# Patient Record
Sex: Female | Born: 1940
Health system: Southern US, Community
[De-identification: ages and names within clinical notes are randomized; demographics above are authoritative.]

## PROBLEM LIST (undated history)

## (undated) DIAGNOSIS — M21372 Foot drop, left foot: Secondary | ICD-10-CM

## (undated) DIAGNOSIS — K219 Gastro-esophageal reflux disease without esophagitis: Secondary | ICD-10-CM

## (undated) DIAGNOSIS — F329 Major depressive disorder, single episode, unspecified: Secondary | ICD-10-CM

## (undated) DIAGNOSIS — R51 Headache: Secondary | ICD-10-CM

## (undated) DIAGNOSIS — M545 Low back pain, unspecified: Secondary | ICD-10-CM

## (undated) DIAGNOSIS — F419 Anxiety disorder, unspecified: Secondary | ICD-10-CM

## (undated) DIAGNOSIS — M1990 Unspecified osteoarthritis, unspecified site: Secondary | ICD-10-CM

## (undated) DIAGNOSIS — I219 Acute myocardial infarction, unspecified: Secondary | ICD-10-CM

## (undated) DIAGNOSIS — J189 Pneumonia, unspecified organism: Secondary | ICD-10-CM

## (undated) DIAGNOSIS — G5600 Carpal tunnel syndrome, unspecified upper limb: Secondary | ICD-10-CM

## (undated) DIAGNOSIS — Z8659 Personal history of other mental and behavioral disorders: Secondary | ICD-10-CM

## (undated) DIAGNOSIS — I509 Heart failure, unspecified: Secondary | ICD-10-CM

## (undated) DIAGNOSIS — I2510 Atherosclerotic heart disease of native coronary artery without angina pectoris: Secondary | ICD-10-CM

## (undated) DIAGNOSIS — I10 Essential (primary) hypertension: Secondary | ICD-10-CM

## (undated) DIAGNOSIS — E785 Hyperlipidemia, unspecified: Secondary | ICD-10-CM

## (undated) DIAGNOSIS — E669 Obesity, unspecified: Secondary | ICD-10-CM

## (undated) DIAGNOSIS — F32A Depression, unspecified: Secondary | ICD-10-CM

## (undated) DIAGNOSIS — Z905 Acquired absence of kidney: Secondary | ICD-10-CM

## (undated) HISTORY — DX: Essential (primary) hypertension: I10

## (undated) HISTORY — PX: CARDIAC CATHETERIZATION: SHX172

## (undated) HISTORY — DX: Obesity, unspecified: E66.9

## (undated) HISTORY — DX: Gastro-esophageal reflux disease without esophagitis: K21.9

## (undated) HISTORY — DX: Major depressive disorder, single episode, unspecified: F32.9

## (undated) HISTORY — PX: TUBAL LIGATION: SHX77

## (undated) HISTORY — DX: Headache: R51

## (undated) HISTORY — DX: Carpal tunnel syndrome, unspecified upper limb: G56.00

## (undated) HISTORY — DX: Acute myocardial infarction, unspecified: I21.9

## (undated) HISTORY — DX: Personal history of other mental and behavioral disorders: Z86.59

## (undated) HISTORY — DX: Acquired absence of kidney: Z90.5

## (undated) HISTORY — PX: BREAST BIOPSY: SHX20

## (undated) HISTORY — DX: Unspecified osteoarthritis, unspecified site: M19.90

## (undated) HISTORY — DX: Low back pain: M54.5

## (undated) HISTORY — DX: Hyperlipidemia, unspecified: E78.5

## (undated) HISTORY — PX: EYE SURGERY: SHX253

## (undated) HISTORY — DX: Heart failure, unspecified: I50.9

## (undated) HISTORY — DX: Depression, unspecified: F32.A

## (undated) HISTORY — DX: Low back pain, unspecified: M54.50

## (undated) HISTORY — DX: Atherosclerotic heart disease of native coronary artery without angina pectoris: I25.10

---

## 1999-05-29 ENCOUNTER — Encounter: Payer: Self-pay | Admitting: Family Medicine

## 1999-05-29 ENCOUNTER — Encounter: Admission: RE | Admit: 1999-05-29 | Discharge: 1999-05-29 | Payer: Self-pay | Admitting: Family Medicine

## 1999-09-26 ENCOUNTER — Ambulatory Visit (HOSPITAL_COMMUNITY): Admission: RE | Admit: 1999-09-26 | Discharge: 1999-09-26 | Payer: Self-pay | Admitting: Family Medicine

## 1999-09-26 ENCOUNTER — Encounter: Payer: Self-pay | Admitting: Family Medicine

## 2000-02-07 ENCOUNTER — Emergency Department (HOSPITAL_COMMUNITY): Admission: EM | Admit: 2000-02-07 | Discharge: 2000-02-07 | Payer: Self-pay | Admitting: Emergency Medicine

## 2000-02-17 ENCOUNTER — Encounter: Payer: Self-pay | Admitting: Family Medicine

## 2000-02-17 ENCOUNTER — Encounter: Admission: RE | Admit: 2000-02-17 | Discharge: 2000-02-17 | Payer: Self-pay | Admitting: Family Medicine

## 2000-03-02 ENCOUNTER — Other Ambulatory Visit: Admission: RE | Admit: 2000-03-02 | Discharge: 2000-03-02 | Payer: Self-pay | Admitting: *Deleted

## 2000-06-22 HISTORY — PX: REPLACEMENT TOTAL HIP W/  RESURFACING IMPLANTS: SUR1222

## 2000-10-05 ENCOUNTER — Ambulatory Visit (HOSPITAL_BASED_OUTPATIENT_CLINIC_OR_DEPARTMENT_OTHER): Admission: RE | Admit: 2000-10-05 | Discharge: 2000-10-05 | Payer: Self-pay | Admitting: Orthopedic Surgery

## 2000-10-18 ENCOUNTER — Inpatient Hospital Stay (HOSPITAL_COMMUNITY): Admission: EM | Admit: 2000-10-18 | Discharge: 2000-10-22 | Payer: Self-pay

## 2000-11-10 ENCOUNTER — Encounter (HOSPITAL_COMMUNITY): Admission: RE | Admit: 2000-11-10 | Discharge: 2001-02-08 | Payer: Self-pay | Admitting: Cardiovascular Disease

## 2000-12-07 ENCOUNTER — Inpatient Hospital Stay (HOSPITAL_COMMUNITY): Admission: EM | Admit: 2000-12-07 | Discharge: 2000-12-08 | Payer: Self-pay | Admitting: Emergency Medicine

## 2000-12-07 ENCOUNTER — Encounter: Payer: Self-pay | Admitting: Emergency Medicine

## 2001-02-14 ENCOUNTER — Encounter: Payer: Self-pay | Admitting: Physical Medicine and Rehabilitation

## 2001-02-14 ENCOUNTER — Encounter
Admission: RE | Admit: 2001-02-14 | Discharge: 2001-02-14 | Payer: Self-pay | Admitting: Physical Medicine and Rehabilitation

## 2001-02-16 ENCOUNTER — Encounter
Admission: RE | Admit: 2001-02-16 | Discharge: 2001-02-16 | Payer: Self-pay | Admitting: Physical Medicine and Rehabilitation

## 2001-02-16 ENCOUNTER — Encounter: Payer: Self-pay | Admitting: Physical Medicine and Rehabilitation

## 2001-03-03 ENCOUNTER — Encounter: Payer: Self-pay | Admitting: *Deleted

## 2001-03-03 ENCOUNTER — Encounter: Admission: RE | Admit: 2001-03-03 | Discharge: 2001-03-03 | Payer: Self-pay | Admitting: *Deleted

## 2001-04-19 ENCOUNTER — Other Ambulatory Visit: Admission: RE | Admit: 2001-04-19 | Discharge: 2001-04-19 | Payer: Self-pay | Admitting: *Deleted

## 2001-05-05 ENCOUNTER — Ambulatory Visit (HOSPITAL_COMMUNITY): Admission: RE | Admit: 2001-05-05 | Discharge: 2001-05-05 | Payer: Self-pay | Admitting: Gastroenterology

## 2001-06-20 ENCOUNTER — Encounter: Payer: Self-pay | Admitting: Orthopedic Surgery

## 2001-06-27 ENCOUNTER — Inpatient Hospital Stay (HOSPITAL_COMMUNITY): Admission: RE | Admit: 2001-06-27 | Discharge: 2001-07-04 | Payer: Self-pay | Admitting: Orthopedic Surgery

## 2001-06-27 ENCOUNTER — Encounter: Payer: Self-pay | Admitting: Orthopedic Surgery

## 2001-07-04 ENCOUNTER — Inpatient Hospital Stay (HOSPITAL_COMMUNITY)
Admission: RE | Admit: 2001-07-04 | Discharge: 2001-07-12 | Payer: Self-pay | Admitting: Physical Medicine & Rehabilitation

## 2001-12-07 ENCOUNTER — Encounter: Payer: Self-pay | Admitting: *Deleted

## 2001-12-07 ENCOUNTER — Ambulatory Visit (HOSPITAL_COMMUNITY): Admission: RE | Admit: 2001-12-07 | Discharge: 2001-12-07 | Payer: Self-pay | Admitting: *Deleted

## 2002-03-07 ENCOUNTER — Encounter: Payer: Self-pay | Admitting: *Deleted

## 2002-03-07 ENCOUNTER — Encounter: Admission: RE | Admit: 2002-03-07 | Discharge: 2002-03-07 | Payer: Self-pay | Admitting: *Deleted

## 2002-07-24 ENCOUNTER — Other Ambulatory Visit: Admission: RE | Admit: 2002-07-24 | Discharge: 2002-07-24 | Payer: Self-pay | Admitting: *Deleted

## 2002-11-03 ENCOUNTER — Ambulatory Visit (HOSPITAL_COMMUNITY): Admission: RE | Admit: 2002-11-03 | Discharge: 2002-11-03 | Payer: Self-pay | Admitting: Neurology

## 2002-11-03 ENCOUNTER — Encounter: Payer: Self-pay | Admitting: Neurology

## 2002-11-12 ENCOUNTER — Encounter: Admission: RE | Admit: 2002-11-12 | Discharge: 2002-11-12 | Payer: Self-pay | Admitting: Neurology

## 2002-11-12 ENCOUNTER — Encounter: Payer: Self-pay | Admitting: Neurology

## 2003-03-09 ENCOUNTER — Encounter: Admission: RE | Admit: 2003-03-09 | Discharge: 2003-03-09 | Payer: Self-pay | Admitting: *Deleted

## 2003-03-09 ENCOUNTER — Encounter: Payer: Self-pay | Admitting: *Deleted

## 2003-06-26 ENCOUNTER — Encounter: Admission: RE | Admit: 2003-06-26 | Discharge: 2003-06-26 | Payer: Self-pay | Admitting: Internal Medicine

## 2003-06-28 ENCOUNTER — Encounter: Admission: RE | Admit: 2003-06-28 | Discharge: 2003-06-28 | Payer: Self-pay | Admitting: Internal Medicine

## 2003-09-12 ENCOUNTER — Emergency Department (HOSPITAL_COMMUNITY): Admission: AD | Admit: 2003-09-12 | Discharge: 2003-09-12 | Payer: Self-pay | Admitting: Family Medicine

## 2003-09-19 ENCOUNTER — Emergency Department (HOSPITAL_COMMUNITY): Admission: EM | Admit: 2003-09-19 | Discharge: 2003-09-19 | Payer: Self-pay | Admitting: Family Medicine

## 2004-05-11 ENCOUNTER — Emergency Department (HOSPITAL_COMMUNITY): Admission: EM | Admit: 2004-05-11 | Discharge: 2004-05-11 | Payer: Self-pay | Admitting: Family Medicine

## 2004-06-05 ENCOUNTER — Encounter: Admission: RE | Admit: 2004-06-05 | Discharge: 2004-06-05 | Payer: Self-pay | Admitting: Internal Medicine

## 2004-07-30 ENCOUNTER — Inpatient Hospital Stay (HOSPITAL_BASED_OUTPATIENT_CLINIC_OR_DEPARTMENT_OTHER): Admission: RE | Admit: 2004-07-30 | Discharge: 2004-07-30 | Payer: Self-pay | Admitting: Cardiovascular Disease

## 2005-06-24 ENCOUNTER — Ambulatory Visit (HOSPITAL_COMMUNITY): Admission: RE | Admit: 2005-06-24 | Discharge: 2005-06-24 | Payer: Self-pay | Admitting: Internal Medicine

## 2005-12-17 ENCOUNTER — Ambulatory Visit: Payer: Self-pay | Admitting: Internal Medicine

## 2006-01-07 ENCOUNTER — Other Ambulatory Visit: Admission: RE | Admit: 2006-01-07 | Discharge: 2006-01-07 | Payer: Self-pay | Admitting: Obstetrics and Gynecology

## 2006-01-27 ENCOUNTER — Ambulatory Visit: Payer: Self-pay | Admitting: Internal Medicine

## 2008-05-11 ENCOUNTER — Encounter: Admission: RE | Admit: 2008-05-11 | Discharge: 2008-05-11 | Payer: Self-pay | Admitting: Obstetrics and Gynecology

## 2009-02-08 ENCOUNTER — Emergency Department (HOSPITAL_COMMUNITY): Admission: EM | Admit: 2009-02-08 | Discharge: 2009-02-08 | Payer: Self-pay | Admitting: Family Medicine

## 2009-05-28 ENCOUNTER — Encounter: Admission: RE | Admit: 2009-05-28 | Discharge: 2009-05-28 | Payer: Self-pay | Admitting: Obstetrics and Gynecology

## 2009-09-20 DIAGNOSIS — M25552 Pain in left hip: Secondary | ICD-10-CM | POA: Insufficient documentation

## 2009-09-20 DIAGNOSIS — R351 Nocturia: Secondary | ICD-10-CM | POA: Insufficient documentation

## 2009-09-20 DIAGNOSIS — I10 Essential (primary) hypertension: Secondary | ICD-10-CM | POA: Insufficient documentation

## 2009-09-20 DIAGNOSIS — R635 Abnormal weight gain: Secondary | ICD-10-CM | POA: Insufficient documentation

## 2009-11-07 DIAGNOSIS — M545 Low back pain, unspecified: Secondary | ICD-10-CM | POA: Insufficient documentation

## 2009-11-07 DIAGNOSIS — I509 Heart failure, unspecified: Secondary | ICD-10-CM | POA: Insufficient documentation

## 2009-11-07 DIAGNOSIS — M129 Arthropathy, unspecified: Secondary | ICD-10-CM | POA: Insufficient documentation

## 2009-11-12 DIAGNOSIS — N952 Postmenopausal atrophic vaginitis: Secondary | ICD-10-CM | POA: Insufficient documentation

## 2009-11-12 DIAGNOSIS — B351 Tinea unguium: Secondary | ICD-10-CM | POA: Insufficient documentation

## 2009-11-12 DIAGNOSIS — F3289 Other specified depressive episodes: Secondary | ICD-10-CM | POA: Insufficient documentation

## 2009-11-12 DIAGNOSIS — F329 Major depressive disorder, single episode, unspecified: Secondary | ICD-10-CM | POA: Insufficient documentation

## 2009-11-14 DIAGNOSIS — R21 Rash and other nonspecific skin eruption: Secondary | ICD-10-CM | POA: Insufficient documentation

## 2010-06-04 ENCOUNTER — Ambulatory Visit: Payer: Self-pay | Admitting: Cardiovascular Disease

## 2010-07-13 ENCOUNTER — Encounter (HOSPITAL_BASED_OUTPATIENT_CLINIC_OR_DEPARTMENT_OTHER): Payer: Self-pay | Admitting: Internal Medicine

## 2010-07-13 ENCOUNTER — Encounter: Payer: Self-pay | Admitting: Family Medicine

## 2010-11-07 NOTE — H&P (Signed)
NAME:  Kristin Mack, Kristin Mack NO.:  [id number]   MEDICAL RECORD NO.:  [id number]          PATIENT TYPE:  AMB   LOCATION:                               FACILITY:  [facility]   PHYSICIAN:  Vesta Mixer, M.D. DATE OF BIRTH:  Dec 15, 1940   DATE OF ADMISSION:  07/30/2004  DATE OF DISCHARGE:                                HISTORY & PHYSICAL   HISTORY OF PRESENT ILLNESS:  Kristin Mack is a middle-aged female with a history  of coronary artery disease and congestive heart failure.  She has done very  well from a cardiac standpoint.  She has not had any episodes of shortness  of breath.  She presents today with some episodes of chest pain and chest  aching.  These episodes of chest pain typically occur when she is under  mental stress, and when she is in overwhelming situations.  She typically  has not had these episodes while swimming.  She has a lot of problems with  anxiety.   She has a history of an idiopathic dilated cardiomyopathy.  Her ejection  fraction was around 15% to 20% when she initially presented in 2002.  Her  last echocardiogram in July of 2005 revealed significant improvement of her  left ventricular function with an ejection fraction of 45% to 50%.   She presents now with these episodes of chest pain.   CURRENT MEDICATIONS:  1.  Aspirin 81 mg daily.  2.  Effexor 150 mg daily.  3.  Lasix 40 mg daily.  4.  Strattera 60 mg daily.  5.  Coreg 12.5 mg p.o. b.i.d.  6.  Neurontin 300 mg p.o. b.i.d.  7.  Protonix 40 mg daily.  8.  Caltrate 600 mg twice a day.  9.  Vytorin 10 mg/40 mg once a day.  10. Ativan 0.5 mg daily.  11. Multivitamin once a day.  12. Glucosamine 1500 mg daily.   ALLERGIES:  None.   PAST MEDICAL HISTORY:  1.  Idiopathic dilated cardiomyopathy.  2.  History of coronary artery disease.  3.  History of depression.  4.  Obesity.  5.  Hyperlipidemia.   SOCIAL HISTORY:  The patient does not smoke and does not drink alcohol.   FAMILY HISTORY:   Noncontributory.   REVIEW OF SYSTEMS:  As noted above.   PHYSICAL EXAMINATION:  GENERAL:  She is a middle-aged female in no acute  distress.  She is alert and oriented x3, and her mood and affect are normal.  VITAL SIGNS:  Her weight is 252.  Blood pressure is 120/80 with a heart rate  of 61.  HEENT:  2+ carotids.  She has no bruits.  There is no JVD, no thyromegaly.  LUNGS:  Clear to auscultation.  HEART:  Regular rate.  S1 and S2 with no murmurs, gallops, or rubs.  ABDOMEN:  Good bowel sounds and is nontender.  EXTREMITIES:  She has no clubbing, cyanosis, or edema.  NEUROLOGIC:  Nonfocal.   EKG reveals normal sinus rhythm.  She has nonspecific ST-T wave  abnormalities in the inferior and lateral leads.   Kristin Mack presents with some  episodes of chest discomfort.  These pains have  been relieved with nitroglycerin.  She has known moderate coronary artery  disease by heart catheterization in April of 2002.  She had moderate disease  of her left anterior descending artery, and had moderate to severe disease  of her diagonal branch.  At the time, it did not appear that her congestive  heart failure was due to her coronary artery disease because the coronary  artery disease did not appear to be severe enough.  Her left ventricular  function has improved dramatically on medical therapy.  We will now schedule  her for a heart catheterization.  We have discussed the risks, benefits, and  options of heart catheterization.  She understands and agrees to proceed.       ___________________________________________  Vesta Mixer, M.D.    PJN/MEDQ  D:  07/28/2004  T:  07/28/2004  Job:  [id number]   cc:   Brunilda Payor, M.D.

## 2010-11-07 NOTE — Cardiovascular Report (Signed)
Kristin Mack, Kristin Mack NO.:  [id number]   MEDICAL RECORD NO.:  [id number]          PATIENT TYPE:  OIB   LOCATION:  6501                         FACILITY:  [facility]   PHYSICIAN:  Vesta Mixer, M.D. DATE OF BIRTH:  14-Jun-1941   DATE OF PROCEDURE:  07/30/2004  DATE OF DISCHARGE:                              CARDIAC CATHETERIZATION   Kristin Mack is a middle-aged female with a history of moderate coronary artery  disease and a dilated cardiomyopathy. She has a history of obesity,  depression, and hyperlipidemia. She presents with an episode of chest pain  under mental stress. She reports no chest pain while swimming. Given her  known diffuse LAD coronary artery disease, we decided to proceed with heart  catheterization for further evaluation.   She had an echocardiogram in July of last year which revealed some improved  left ventricular systolic function. Ejection fraction was around 45-50%.   She is referred for heart catheterization because of these episodes of chest  discomfort with mental stress.   PROCEDURE:  Left heart catheterization with coronary angiography.   The right femoral artery was easily cannulated using modified Seldinger  technique.   HEMODYNAMICS:  LV pressure was 115/4 with an aortic pressure of 110/57.   ANGIOGRAPHY:  1.  Left main:  The left main is fairly normal.  2.  Left anterior descending artery:  There were proximal irregularities in      the LAD ranging from 30-50%. These lesions are somewhat diffuse. The mid      and distal LAD had moderate luminal irregularities.  3.  The first diagonal vessel has a 60 to 70% stenosis at the takeoff of the      of the first diagonal vessel. This is approximately a 1.5 mm vessel and      is not a good candidate for angioplasty.  The remainder of the diagonal      and LAD have minor luminal irregularities.  4.  The left circumflex artery is very large and is dominant. It gives off a      high first  obtuse marginal artery. This vessel bifurcates into two      different branches. Each of these branches has minor luminal      irregularities.  The remainder of the circumflex artery is fairly large.      It gives off several large third obtuse marginal artery and      posterolateral branches and gives off a posterior descending artery.  5.  The right coronary artery is small and is nondominant.  There are no      significant irregularities.  6.  The left ventriculogram was performed in the 30 RAO position. It reveals      mildly to moderately depressed left ventricular systolic function with      an ejection fraction of around 40-45%.   COMPLICATIONS:  None.   CONCLUSIONS:  1.  Moderate coronary artery disease primarily involving the left anterior      descending  and diagonal distributions. The left anterior descending      stenosis is perhaps only 40-50%,  and I  really do not think it would      benefit from angioplasty. The diagonal stenosis is at least 60 to 70%,      but the vessel is fairly small. I suspect that it is around 1.5 mm in      diameter, and this is an is not a good candidate for stenting.  2.  She has a moderately depressed left ventricular systolic function.      Overall, her left ventricular function has      greatly improved over the past 4 years. The ejection fraction was as low      as 15%, but she seems to be recovering nicely from what appears to be a      dilated cardiomyopathy of unclear etiology. I do not think that the      congestive heart failure is due to the coronary artery disease. We will      continue with medical therapy.      PJN/MEDQ  D:  07/30/2004  T:  07/30/2004  Job:  [id number]   cc:   Barry Dienes. Eloise Harman, M.D.  [address]  [place]  [place] [address]  Fax: [phone]

## 2011-02-04 ENCOUNTER — Other Ambulatory Visit: Payer: Self-pay | Admitting: *Deleted

## 2011-02-06 ENCOUNTER — Ambulatory Visit: Payer: Self-pay | Admitting: Nurse Practitioner

## 2011-02-16 ENCOUNTER — Other Ambulatory Visit: Payer: Self-pay | Admitting: Cardiovascular Disease

## 2011-02-16 NOTE — Telephone Encounter (Signed)
Fax received from pharmacy. Refill completed. Jodette  RN  

## 2011-03-09 ENCOUNTER — Encounter: Payer: Self-pay | Admitting: Cardiovascular Disease

## 2011-03-09 ENCOUNTER — Telehealth: Payer: Self-pay | Admitting: *Deleted

## 2011-03-09 ENCOUNTER — Ambulatory Visit (INDEPENDENT_AMBULATORY_CARE_PROVIDER_SITE_OTHER): Payer: Medicare Other | Admitting: *Deleted

## 2011-03-09 DIAGNOSIS — E7800 Pure hypercholesterolemia, unspecified: Secondary | ICD-10-CM

## 2011-03-09 DIAGNOSIS — E785 Hyperlipidemia, unspecified: Secondary | ICD-10-CM

## 2011-03-09 DIAGNOSIS — I10 Essential (primary) hypertension: Secondary | ICD-10-CM

## 2011-03-09 DIAGNOSIS — K219 Gastro-esophageal reflux disease without esophagitis: Secondary | ICD-10-CM | POA: Insufficient documentation

## 2011-03-09 DIAGNOSIS — G5600 Carpal tunnel syndrome, unspecified upper limb: Secondary | ICD-10-CM | POA: Insufficient documentation

## 2011-03-09 LAB — LIPID PANEL
Cholesterol: 140 mg/dL (ref 0–200)
HDL: 51 mg/dL (ref >39.00)
LDL Cholesterol: 63 mg/dL (ref 0–99)
Total CHOL/HDL Ratio: 3
Triglycerides: 128 mg/dL (ref 0.0–149.0)
VLDL: 25.6 mg/dL (ref 0.0–40.0)

## 2011-03-09 LAB — HEPATIC FUNCTION PANEL
ALT: 14 U/L (ref 0–35)
AST: 23 U/L (ref 0–37)
Albumin: 4 g/dL (ref 3.5–5.2)
Alkaline Phosphatase: 78 U/L (ref 39–117)
Bilirubin, Direct: 0 mg/dL (ref 0.0–0.3)
Total Bilirubin: 0.8 mg/dL (ref 0.3–1.2)
Total Protein: 6.9 g/dL (ref 6.0–8.3)

## 2011-03-09 LAB — BASIC METABOLIC PANEL
BUN: 20 mg/dL (ref 6–23)
CO2: 28 mEq/L (ref 19–32)
Calcium: 9 mg/dL (ref 8.4–10.5)
Chloride: 104 mEq/L (ref 96–112)
Creatinine, Ser: 0.7 mg/dL (ref 0.4–1.2)
GFR: 89.93 mL/min (ref >60.00)
Glucose, Bld: 95 mg/dL (ref 70–99)
Potassium: 4.7 mEq/L (ref 3.5–5.1)
Sodium: 138 mEq/L (ref 135–145)

## 2011-03-09 NOTE — Telephone Encounter (Signed)
Pt at lab no orders entered, orders placed

## 2011-03-11 ENCOUNTER — Ambulatory Visit (INDEPENDENT_AMBULATORY_CARE_PROVIDER_SITE_OTHER): Payer: Medicare Other | Admitting: Cardiovascular Disease

## 2011-03-11 ENCOUNTER — Encounter: Payer: Self-pay | Admitting: Cardiovascular Disease

## 2011-03-11 DIAGNOSIS — I5022 Chronic systolic (congestive) heart failure: Secondary | ICD-10-CM | POA: Insufficient documentation

## 2011-03-11 DIAGNOSIS — I509 Heart failure, unspecified: Secondary | ICD-10-CM

## 2011-03-11 DIAGNOSIS — E785 Hyperlipidemia, unspecified: Secondary | ICD-10-CM | POA: Insufficient documentation

## 2011-03-11 NOTE — Assessment & Plan Note (Addendum)
Her most recent  lipid levels are very good. We'll continue with her same medications. We'll check them again in 6 months at her next office visit.

## 2011-03-11 NOTE — Progress Notes (Signed)
Kristin Mack Date of Birth  09/21/1940 [facility] [address]    [address] [place], [place]  [address] [phone]  Fax  [phone]  History of Present Illness:  70 year old female with history of congestive heart failure. Her original ejection fraction was 10-20%. Her ejection fraction has dramatically improved and is now 57% 2009.  She had an echocardiogram in February 2011 that revealed normal left ventricle systolic function.  She's had 2 heart catheterizations. Her last cardiac cath was in 2006 which revealed moderate irregularities.    She's not having any episodes of chest pain or shortness breath. She works out a regular basis at least 2-3 times a week.  Current Outpatient Prescriptions on File Prior to Visit  Medication Sig Dispense Refill  . aspirin 81 MG tablet Take 81 mg by mouth daily.        . carvedilol (COREG) 12.5 MG tablet take 1 tablet by mouth twice a day  68 tablet  5  . furosemide (LASIX) 40 MG tablet Take 40 mg by mouth daily.        Marland Kitchen lisinopril (PRINIVIL,ZESTRIL) 10 MG tablet take 1 tablet by mouth once daily  30 tablet  5  . Multiple Vitamin (MULTI-VITAMIN PO) Take by mouth daily.        . rosuvastatin (CRESTOR) 20 MG tablet Take 20 mg by mouth daily.        Marland Kitchen venlafaxine (EFFEXOR-XR) 150 MG 24 hr capsule Take 150 mg by mouth daily.          No Known Allergies  Past Medical History  Diagnosis Date  . Coronary artery disease   . CHF (congestive heart failure)     Dilated cardiomyopathy. Her original ejection fraction was 10-20%. Her ejection fraction has improved to 57% by 2009  . Hypertension   . Dyslipidemia   . Chest pain   . Myocardial infarction   . Depression   . Obesity   . Headache   . Carpal tunnel syndrome     Left Hand  . GERD (gastroesophageal reflux disease)   . Lower back pain   . Hemorrhoids   . History of anxiety   . Osteoarthritis     Severe with collapse of the left hip    Past Surgical History    Procedure Date  . Tubal ligation   . Cardiac catheterization     Ejection Fraction was around 45-50%    History  Smoking status  . Never Smoker   Smokeless tobacco  . Not on file    History  Alcohol Use No    Family History  Problem Relation Age of Onset  . Stroke Mother   . Cancer Sister   . Stroke Maternal Grandfather     Reviw of Systems:  Reviewed in the HPI.  All other systems are negative.  Physical Exam: BP 124/72  Pulse 68  Ht 5\' 6"  (1.676 m)  Wt 243 lb 3.2 oz (110.315 kg)  BMI 39.25 kg/m2 The patient is alert and oriented x 3.  The mood and affect are normal.   Skin: warm and dry.  Color is normal.    HEENT:   the sclera are nonicteric.  The mucous membranes are moist.  The carotids are 2+ without bruits.  There is no thyromegaly.  There is no JVD.    Lungs: clear.  The chest wall is non tender.    Heart: regular rate with a normal S1 and S2.  There are no  murmurs, gallops, or rubs. The PMI is not displaced.     Abdomen: good bowel sounds.  There is no guarding or rebound.  There is no hepatosplenomegaly or tenderness.  There are no masses.   Extremities:  no clubbing, cyanosis, or edema.  The legs are without rashes.  The distal pulses are intact.   Neuro:  Cranial nerves II - XII are intact.  Motor and sensory functions are intact.    The gait is normal.   Assessment / Plan:

## 2011-03-11 NOTE — Assessment & Plan Note (Addendum)
Kristin Mack has done very well on her current medical therapy. She's now basically symptomatically. She is limited somewhat by her back pain but has not had any episodes of chest pain or shortness breath. She overall feels very well.  I'll see her back in the office in 6 months.

## 2011-03-18 ENCOUNTER — Other Ambulatory Visit: Payer: Self-pay | Admitting: Cardiovascular Disease

## 2011-03-18 DIAGNOSIS — F5104 Psychophysiologic insomnia: Secondary | ICD-10-CM | POA: Insufficient documentation

## 2011-03-18 DIAGNOSIS — R3129 Other microscopic hematuria: Secondary | ICD-10-CM | POA: Insufficient documentation

## 2011-04-20 ENCOUNTER — Other Ambulatory Visit: Payer: Self-pay | Admitting: Cardiovascular Disease

## 2011-05-01 ENCOUNTER — Other Ambulatory Visit: Payer: Self-pay | Admitting: Obstetrics and Gynecology

## 2011-05-01 DIAGNOSIS — Z1231 Encounter for screening mammogram for malignant neoplasm of breast: Secondary | ICD-10-CM

## 2011-05-05 ENCOUNTER — Ambulatory Visit
Admission: RE | Admit: 2011-05-05 | Discharge: 2011-05-05 | Disposition: A | Discharge status: Home / Self Care | Payer: Medicare Other | Source: Ambulatory Visit | Attending: Obstetrics and Gynecology | Admitting: Obstetrics and Gynecology

## 2011-05-05 DIAGNOSIS — Z1231 Encounter for screening mammogram for malignant neoplasm of breast: Secondary | ICD-10-CM

## 2011-05-11 DIAGNOSIS — M2010 Hallux valgus (acquired), unspecified foot: Secondary | ICD-10-CM | POA: Insufficient documentation

## 2011-05-11 DIAGNOSIS — E11621 Type 2 diabetes mellitus with foot ulcer: Secondary | ICD-10-CM | POA: Insufficient documentation

## 2011-06-11 ENCOUNTER — Other Ambulatory Visit (HOSPITAL_COMMUNITY): Payer: Self-pay | Admitting: Obstetrics and Gynecology

## 2011-06-11 DIAGNOSIS — N950 Postmenopausal bleeding: Secondary | ICD-10-CM

## 2011-06-12 ENCOUNTER — Ambulatory Visit (HOSPITAL_COMMUNITY)
Admission: RE | Admit: 2011-06-12 | Discharge: 2011-06-12 | Disposition: A | Discharge status: Home / Self Care | Payer: Medicare Other | Source: Ambulatory Visit | Attending: Obstetrics and Gynecology | Admitting: Obstetrics and Gynecology

## 2011-06-12 DIAGNOSIS — N83209 Unspecified ovarian cyst, unspecified side: Secondary | ICD-10-CM | POA: Insufficient documentation

## 2011-06-12 DIAGNOSIS — N950 Postmenopausal bleeding: Secondary | ICD-10-CM | POA: Insufficient documentation

## 2011-06-12 DIAGNOSIS — D259 Leiomyoma of uterus, unspecified: Secondary | ICD-10-CM | POA: Insufficient documentation

## 2011-06-22 ENCOUNTER — Telehealth: Payer: Self-pay | Admitting: Cardiovascular Disease

## 2011-06-22 NOTE — Telephone Encounter (Signed)
New msg Pt wants to see if ok to have procedure done. Please call her back

## 2011-06-22 NOTE — Telephone Encounter (Signed)
Pt wants to know if it is ok with Dr Elease Hashimoto for her to have a D&C next month.  The date has not yet been set.  She is aware that Dr Elease Hashimoto is off this week and she is ok with receiving a call next week.

## 2011-06-24 DIAGNOSIS — F339 Major depressive disorder, recurrent, unspecified: Secondary | ICD-10-CM | POA: Diagnosis not present

## 2011-06-24 DIAGNOSIS — N950 Postmenopausal bleeding: Secondary | ICD-10-CM | POA: Diagnosis not present

## 2011-06-24 NOTE — Telephone Encounter (Signed)
msg left to clarify, pt not on anticoags, is she wanting/ needing cardiac clearance? Office number left in msg to give [place] more info of needs.

## 2011-06-25 ENCOUNTER — Telehealth: Payer: Self-pay | Admitting: Cardiovascular Disease

## 2011-06-25 NOTE — Telephone Encounter (Signed)
Kristin Mack is at low risk for cardiac complications for her D&C.  Vesta Mixer, Montez Hageman., MD, [facility] 06/25/2011, 7:40 PM

## 2011-06-25 NOTE — Telephone Encounter (Signed)
App made, declines anyone other than dr Elease Hashimoto.

## 2011-06-25 NOTE — Telephone Encounter (Signed)
New Problem:    Patient has been feeling light headed, has fallen three times and has been having unusual headaches since before the holidays.  Would like to be seen by Dr. Elease Hashimoto as soon as possible. Please advise.

## 2011-06-26 NOTE — Telephone Encounter (Signed)
Made app for pt

## 2011-06-29 DIAGNOSIS — Z01419 Encounter for gynecological examination (general) (routine) without abnormal findings: Secondary | ICD-10-CM | POA: Diagnosis not present

## 2011-06-29 DIAGNOSIS — Z8041 Family history of malignant neoplasm of ovary: Secondary | ICD-10-CM | POA: Diagnosis not present

## 2011-06-29 DIAGNOSIS — Z0000 Encounter for general adult medical examination without abnormal findings: Secondary | ICD-10-CM | POA: Diagnosis not present

## 2011-06-29 DIAGNOSIS — N950 Postmenopausal bleeding: Secondary | ICD-10-CM | POA: Diagnosis not present

## 2011-06-29 DIAGNOSIS — Z124 Encounter for screening for malignant neoplasm of cervix: Secondary | ICD-10-CM | POA: Diagnosis not present

## 2011-06-30 DIAGNOSIS — I83219 Varicose veins of right lower extremity with both ulcer of unspecified site and inflammation: Secondary | ICD-10-CM | POA: Diagnosis not present

## 2011-06-30 DIAGNOSIS — F339 Major depressive disorder, recurrent, unspecified: Secondary | ICD-10-CM | POA: Diagnosis not present

## 2011-06-30 DIAGNOSIS — I83229 Varicose veins of left lower extremity with both ulcer of unspecified site and inflammation: Secondary | ICD-10-CM | POA: Diagnosis not present

## 2011-06-30 DIAGNOSIS — M2020 Hallux rigidus, unspecified foot: Secondary | ICD-10-CM | POA: Diagnosis not present

## 2011-06-30 DIAGNOSIS — L97919 Non-pressure chronic ulcer of unspecified part of right lower leg with unspecified severity: Secondary | ICD-10-CM | POA: Diagnosis not present

## 2011-06-30 DIAGNOSIS — M79609 Pain in unspecified limb: Secondary | ICD-10-CM | POA: Diagnosis not present

## 2011-07-07 ENCOUNTER — Encounter: Payer: Self-pay | Admitting: Cardiovascular Disease

## 2011-07-07 ENCOUNTER — Ambulatory Visit (INDEPENDENT_AMBULATORY_CARE_PROVIDER_SITE_OTHER): Payer: Medicare Other | Admitting: Cardiovascular Disease

## 2011-07-07 VITALS — BP 128/75 | HR 76 | Ht 66.5 in | Wt 249.4 lb

## 2011-07-07 DIAGNOSIS — L989 Disorder of the skin and subcutaneous tissue, unspecified: Secondary | ICD-10-CM | POA: Diagnosis not present

## 2011-07-07 DIAGNOSIS — I509 Heart failure, unspecified: Secondary | ICD-10-CM | POA: Diagnosis not present

## 2011-07-07 DIAGNOSIS — L97509 Non-pressure chronic ulcer of other part of unspecified foot with unspecified severity: Secondary | ICD-10-CM | POA: Diagnosis not present

## 2011-07-07 NOTE — Patient Instructions (Signed)
Your physician wants you to follow-up in: 6 MONTHS You will receive a reminder letter in the mail two months in advance. If you don't receive a letter, please call our office to schedule the follow-up appointment.  Your physician recommends that you return for a FASTING lipid profile: 6 MONTHS    

## 2011-07-07 NOTE — Assessment & Plan Note (Signed)
Kristin Mack is doing great well. Her left ventricular systolic function has improved dramatically since it had on good medical therapy. I do not think that she has any worsening of her LV function. We will get an echocardiogram at a later time.  She needs to have some GYN surgery. She is at low risk for upcoming GYN surgery.

## 2011-07-07 NOTE — Progress Notes (Signed)
Kristin Mack Date of Birth  [birth year] [facility]  [address]    [address]   [address] [place], [place]  [address]    [place], [place]  [address] [phone]  Fax  [phone]  [phone]  Fax [phone]   History of Present Illness:  71 year old female with history of congestive heart failure. Her original ejection fraction was 10-20%. Her ejection fraction has dramatically improved and is now 57% ( Echo 2009). She had an echocardiogram in February 2011 that revealed normal left ventricle systolic function.  She's had 2 heart catheterizations. Her last cardiac cath was in 2006 which revealed moderate irregularities.  She's not having any episodes of chest pain or shortness breath. She works out a regular basis at least 2-3 times a week.  She has gained 20 lbs over the past several months.  She has a foot ulcer in her left foot and has not been in the pool.  She's not had any angina-like chest pain. She did have some stress-related chest discomfort around Christmastime but that has since resolved.   Current Outpatient Prescriptions on File Prior to Visit  Medication Sig Dispense Refill  . aspirin 81 MG tablet Take 81 mg by mouth daily.       . carvedilol (COREG) 12.5 MG tablet take 1 tablet by mouth twice a day  68 tablet  5  . CRESTOR 20 MG tablet take 1 tablet by mouth once daily  30 tablet  PRN  . furosemide (LASIX) 40 MG tablet take 1 tablet by mouth once daily  34 tablet  4  . HYDROcodone-acetaminophen (VICODIN) 5-500 MG per tablet Take 0.5 tablets by mouth as needed.        Marland Kitchen lisinopril (PRINIVIL,ZESTRIL) 10 MG tablet take 1 tablet by mouth once daily  30 tablet  5  . LORazepam (ATIVAN) 0.5 MG tablet Take 0.5 mg by mouth as needed.        . Multiple Vitamin (MULTI-VITAMIN PO) Take by mouth daily.        . naproxen (NAPROSYN) 500 MG tablet Take 250 mg by mouth as needed.        Marland Kitchen NITROSTAT 0.4 MG SL tablet PLACE 1 TABLET  UNDER TONGUE IF NEEDED FOR CHEST PAIN  25 tablet  11  . pantoprazole (PROTONIX) 40 MG tablet Take 40 mg by mouth daily.        Marland Kitchen venlafaxine (EFFEXOR-XR) 150 MG 24 hr capsule Take 150 mg by mouth as needed.         No Known Allergies  Past Medical History  Diagnosis Date  . Coronary artery disease   . CHF (congestive heart failure)     Dilated cardiomyopathy. Her original ejection fraction was 10-20%. Her ejection fraction has improved to 57% by 2009  . Hypertension   . Dyslipidemia   . Chest pain   . Myocardial infarction   . Depression   . Obesity   . Headache   . Carpal tunnel syndrome     Left Hand  . GERD (gastroesophageal reflux disease)   . Lower back pain   . Hemorrhoids   . History of anxiety   . Osteoarthritis     Severe with collapse of the left hip    Past Surgical History  Procedure Date  . Tubal ligation   . Cardiac catheterization     Ejection Fraction was around 45-50%    History  Smoking status  .  Never Smoker   Smokeless tobacco  . Not on file    History  Alcohol Use No    Family History  Problem Relation Age of Onset  . Stroke Mother   . Cancer Sister   . Stroke Maternal Grandfather     Reviw of Systems:  Reviewed in the HPI.  All other systems are negative.  Physical Exam: BP 128/75  Pulse 76  Ht 5' 6.5" (1.689 m)  Wt 249 lb 6.4 oz (113.127 kg)  BMI 39.65 kg/m2 The patient is alert and oriented x 3.  The mood and affect are normal.   Skin: warm and dry.  Color is normal.    HEENT:   Normocephalic/atraumatic. She is no JVD. She has normal carotid arteries. Her neck is supple.  Lungs: Lungs are clear  Heart: Regular rate S1-S2   Abdomen: She is mildly obese. No hepatosplenomegaly  Extremities:  No clubbing cyanosis or edema.  Neuro:  Neurologic exam is nonfocal.    ECG: Sinus rhythm. She has premature atrial contractions with apparent conduction. She has nonspecific ST-T wave abnormalities  Assessment / Plan:

## 2011-07-08 DIAGNOSIS — F339 Major depressive disorder, recurrent, unspecified: Secondary | ICD-10-CM | POA: Diagnosis not present

## 2011-07-21 DIAGNOSIS — N3941 Urge incontinence: Secondary | ICD-10-CM | POA: Diagnosis not present

## 2011-07-21 DIAGNOSIS — M2040 Other hammer toe(s) (acquired), unspecified foot: Secondary | ICD-10-CM | POA: Diagnosis not present

## 2011-07-21 DIAGNOSIS — M6281 Muscle weakness (generalized): Secondary | ICD-10-CM | POA: Diagnosis not present

## 2011-07-21 DIAGNOSIS — R279 Unspecified lack of coordination: Secondary | ICD-10-CM | POA: Diagnosis not present

## 2011-07-21 DIAGNOSIS — L97509 Non-pressure chronic ulcer of other part of unspecified foot with unspecified severity: Secondary | ICD-10-CM | POA: Diagnosis not present

## 2011-07-22 DIAGNOSIS — F339 Major depressive disorder, recurrent, unspecified: Secondary | ICD-10-CM | POA: Diagnosis not present

## 2011-07-28 ENCOUNTER — Ambulatory Visit: Payer: Medicare Other | Admitting: Cardiovascular Disease

## 2011-07-28 DIAGNOSIS — N840 Polyp of corpus uteri: Secondary | ICD-10-CM | POA: Diagnosis not present

## 2011-07-28 DIAGNOSIS — N950 Postmenopausal bleeding: Secondary | ICD-10-CM | POA: Diagnosis not present

## 2011-07-28 DIAGNOSIS — D251 Intramural leiomyoma of uterus: Secondary | ICD-10-CM | POA: Diagnosis not present

## 2011-07-29 DIAGNOSIS — R059 Cough, unspecified: Secondary | ICD-10-CM | POA: Diagnosis not present

## 2011-07-29 DIAGNOSIS — J329 Chronic sinusitis, unspecified: Secondary | ICD-10-CM | POA: Insufficient documentation

## 2011-07-29 DIAGNOSIS — R52 Pain, unspecified: Secondary | ICD-10-CM | POA: Diagnosis not present

## 2011-07-29 DIAGNOSIS — R509 Fever, unspecified: Secondary | ICD-10-CM | POA: Diagnosis not present

## 2011-07-29 DIAGNOSIS — J069 Acute upper respiratory infection, unspecified: Secondary | ICD-10-CM | POA: Diagnosis not present

## 2011-07-29 DIAGNOSIS — R05 Cough: Secondary | ICD-10-CM | POA: Insufficient documentation

## 2011-07-31 DIAGNOSIS — R05 Cough: Secondary | ICD-10-CM | POA: Diagnosis not present

## 2011-07-31 DIAGNOSIS — J069 Acute upper respiratory infection, unspecified: Secondary | ICD-10-CM | POA: Diagnosis not present

## 2011-07-31 DIAGNOSIS — R059 Cough, unspecified: Secondary | ICD-10-CM | POA: Diagnosis not present

## 2011-08-17 ENCOUNTER — Encounter (HOSPITAL_COMMUNITY): Payer: Self-pay

## 2011-08-19 DIAGNOSIS — F339 Major depressive disorder, recurrent, unspecified: Secondary | ICD-10-CM | POA: Diagnosis not present

## 2011-08-20 ENCOUNTER — Other Ambulatory Visit: Payer: Self-pay | Admitting: Cardiovascular Disease

## 2011-08-20 NOTE — Telephone Encounter (Signed)
Refilled carvedilol and lisinopril

## 2011-08-27 ENCOUNTER — Encounter (HOSPITAL_COMMUNITY): Payer: Self-pay

## 2011-08-27 ENCOUNTER — Encounter (HOSPITAL_COMMUNITY)
Admission: RE | Admit: 2011-08-27 | Discharge: 2011-08-27 | Disposition: A | Discharge status: Home / Self Care | Payer: Medicare Other | Source: Ambulatory Visit | Attending: Obstetrics and Gynecology | Admitting: Obstetrics and Gynecology

## 2011-08-27 DIAGNOSIS — N950 Postmenopausal bleeding: Secondary | ICD-10-CM | POA: Diagnosis not present

## 2011-08-27 DIAGNOSIS — N840 Polyp of corpus uteri: Secondary | ICD-10-CM | POA: Diagnosis not present

## 2011-08-27 DIAGNOSIS — Z01818 Encounter for other preprocedural examination: Secondary | ICD-10-CM | POA: Diagnosis not present

## 2011-08-27 DIAGNOSIS — Z01812 Encounter for preprocedural laboratory examination: Secondary | ICD-10-CM | POA: Diagnosis not present

## 2011-08-27 HISTORY — DX: Foot drop, left foot: M21.372

## 2011-08-27 LAB — CBC
HCT: 42.4 % (ref 36.0–46.0)
Hemoglobin: 14.7 g/dL (ref 12.0–15.0)
MCH: 30.1 pg (ref 26.0–34.0)
MCHC: 34.7 g/dL (ref 30.0–36.0)
MCV: 86.7 fL (ref 78.0–100.0)
Platelets: 211 10*3/uL (ref 150–400)
RBC: 4.89 MIL/uL (ref 3.87–5.11)
RDW: 15.1 % (ref 11.5–15.5)
WBC: 8 10*3/uL (ref 4.0–10.5)

## 2011-08-27 LAB — BASIC METABOLIC PANEL
BUN: 15 mg/dL (ref 6–23)
CO2: 30 mEq/L (ref 19–32)
Calcium: 9.6 mg/dL (ref 8.4–10.5)
Chloride: 97 mEq/L (ref 96–112)
Creatinine, Ser: 0.82 mg/dL (ref 0.50–1.10)
GFR calc Af Amer: 83 mL/min — ABNORMAL LOW (ref >90)
GFR calc non Af Amer: 72 mL/min — ABNORMAL LOW (ref >90)
Glucose, Bld: 132 mg/dL — ABNORMAL HIGH (ref 70–99)
Potassium: 4.7 mEq/L (ref 3.5–5.1)
Sodium: 133 mEq/L — ABNORMAL LOW (ref 135–145)

## 2011-08-27 NOTE — Patient Instructions (Addendum)
20 Kristin Mack  08/27/2011   Your procedure is scheduled on:  09/03/11  Enter through the Main Entrance of [facility] at 6 AM.  Pick up the phone at the desk and dial [phone].   Call this number if you have problems the morning of surgery: [phone]   Remember:   Do not eat food:After Midnight.  Do not drink clear liquids: After Midnight.  Take these medicines the morning of surgery with A SIP OF WATER: Protonix, Lisinopril, Coreg, Effexor, may hold Lasix   Do not wear jewelry, make-up or nail polish.  Do not wear lotions, powders, or perfumes. You may wear deodorant.  Do not shave 48 hours prior to surgery.  Do not bring valuables to the hospital.  Contacts, dentures or bridgework may not be worn into surgery.  Leave suitcase in the car. After surgery it may be brought to your room.  For patients admitted to the hospital, checkout time is 11:00 AM the day of discharge.   Patients discharged the day of surgery will not be allowed to drive home.  Name and phone number of your driver: son Raliyah Montella  Special Instructions: CHG Shower Use Special Wash: 1/2 bottle night before surgery and 1/2 bottle morning of surgery.   Please read over the following fact sheets that you were given: Surgical Site Infection Prevention

## 2011-08-27 NOTE — Anesthesia Preprocedure Evaluation (Addendum)
Anesthesia Evaluation  Patient identified by MRN, date of birth, ID band Patient awake    Reviewed: Allergy & Precautions, H&P , NPO status , Patient's Chart, lab work & pertinent test results, reviewed documented beta blocker date and time   Airway Mallampati: I TM Distance: >3 FB Neck ROM: full    Dental  (+) Teeth Intact and Dental Advisory Given   Pulmonary  breath sounds clear to auscultation        Cardiovascular Exercise Tolerance: Good hypertension (136/72 today), On Home Beta Blockers + CAD (recent cath 2006 - moderate CAD, nothing stentable (see records in chart)), + Past MI (10-12 years ago) and +CHF (one episode 10-12 years ago (EF was 15%) , no exacerbations since , last echo 2009 EF was 57%) Rhythm:regular Rate:Normal     Neuro/Psych  Headaches, PSYCHIATRIC DISORDERS (depression, anxiety) Chronic left foot drop since hip replacement    GI/Hepatic GERD-  Medicated,  Endo/Other  Morbid obesity  Renal/GU      Musculoskeletal   Abdominal   Peds  Hematology   Anesthesia Other Findings   Reproductive/Obstetrics                          Anesthesia Physical Anesthesia Plan  ASA: III  Anesthesia Plan: MAC   Post-op Pain Management:    Induction: Intravenous  Airway Management Planned: Simple Face Mask  Additional Equipment:   Intra-op Plan:   Post-operative Plan:   Informed Consent: I have reviewed the patients History and Physical, chart, labs and discussed the procedure including the risks, benefits and alternatives for the proposed anesthesia with the patient or authorized representative who has indicated his/her understanding and acceptance.   Dental Advisory Given  Plan Discussed with: Surgeon, CRNA and Anesthesiologist  Anesthesia Plan Comments: (Discussed MAC/local vs Spinal.  Patient in agreement.)       Anesthesia Quick Evaluation

## 2011-08-28 DIAGNOSIS — M79609 Pain in unspecified limb: Secondary | ICD-10-CM | POA: Diagnosis not present

## 2011-08-28 DIAGNOSIS — M2020 Hallux rigidus, unspecified foot: Secondary | ICD-10-CM | POA: Diagnosis not present

## 2011-08-28 DIAGNOSIS — L97509 Non-pressure chronic ulcer of other part of unspecified foot with unspecified severity: Secondary | ICD-10-CM | POA: Diagnosis not present

## 2011-08-31 DIAGNOSIS — IMO0002 Reserved for concepts with insufficient information to code with codable children: Secondary | ICD-10-CM | POA: Diagnosis not present

## 2011-08-31 DIAGNOSIS — L02411 Cutaneous abscess of right axilla: Secondary | ICD-10-CM | POA: Insufficient documentation

## 2011-09-02 DIAGNOSIS — IMO0002 Reserved for concepts with insufficient information to code with codable children: Secondary | ICD-10-CM | POA: Diagnosis not present

## 2011-09-02 DIAGNOSIS — F339 Major depressive disorder, recurrent, unspecified: Secondary | ICD-10-CM | POA: Diagnosis not present

## 2011-09-02 NOTE — H&P (Addendum)
Kristin Mack is an 71 y.o. female G2P2 presenting for hysteroscopic resection of an endometrial polyp.  Pt began to notice PMB several months ago and w/u included a negative endomtrial biopsy and a saline infused [place] which showed a very large polyp filling the cavity. 2x3x2 cm with an apparent right fundal attachment.  She presents now for resection of this polyp to eliminate her bleeding and confirm benign pathology.  She did get clearance from her cardiologist for this procedure.  OB History: NSVD x 2    Menstrual History:  No LMP recorded. Patient is postmenopausal.    Past Medical History  Diagnosis Date  . Coronary artery disease   . CHF (congestive heart failure)     Dilated cardiomyopathy. Her original ejection fraction was 10-20%. Her ejection fraction has improved to 57% by 2009  . Hypertension   . Dyslipidemia   . Chest pain   . Myocardial infarction   . Depression   . Obesity   . Headache   . Carpal tunnel syndrome     Left Hand  . GERD (gastroesophageal reflux disease)   . Lower back pain   . Hemorrhoids   . History of anxiety   . Osteoarthritis     Severe with collapse of the left hip  . Foot drop, left     Past Surgical History  Procedure Date  . Tubal ligation   . Cardiac catheterization     Ejection Fraction was around 45-50%  . Replacement total hip w/  resurfacing implants 2002    left    Family History  Problem Relation Age of Onset  . Stroke Mother   . Cancer Sister   . Stroke Maternal Grandfather     Social History:  reports that she has never smoked. She does not have any smokeless tobacco history on file. She reports that she does not drink alcohol. Her drug history not on file.  Allergies: No Known Allergies  No prescriptions prior to admission    ROS  There were no vitals taken for this visit. Physical Exam  Constitutional: She is oriented to person, place, and time. She appears well-developed and well-nourished.    Cardiovascular: Normal rate and regular rhythm.   Respiratory: Effort normal and breath sounds normal.  GI: Soft. Bowel sounds are normal.  Genitourinary: Vagina normal and uterus normal.  Neurological: She is alert and oriented to person, place, and time.  Psychiatric: She has a normal mood and affect. Her behavior is normal.     Assessment/Plan: The patient was counseled on the risks and benefits of procedure including bleeding and uterine perforation.  She understands and desires to proceed.  She will use cytotec 3 hours prior to surgery to help with dilation.  Oliver Pila 09/02/2011, 12:16 PM  No changes in dictated H&P except had abscess lanced under her arm, healing now.

## 2011-09-03 ENCOUNTER — Encounter (HOSPITAL_COMMUNITY): Payer: Self-pay | Admitting: Anesthesiology

## 2011-09-03 ENCOUNTER — Ambulatory Visit (HOSPITAL_COMMUNITY): Payer: Medicare Other | Admitting: Anesthesiology

## 2011-09-03 ENCOUNTER — Ambulatory Visit (HOSPITAL_COMMUNITY)
Admission: RE | Admit: 2011-09-03 | Discharge: 2011-09-03 | Disposition: A | Discharge status: Home / Self Care | Payer: Medicare Other | Source: Ambulatory Visit | Attending: Obstetrics and Gynecology | Admitting: Obstetrics and Gynecology

## 2011-09-03 ENCOUNTER — Encounter (HOSPITAL_COMMUNITY)
Admission: RE | Disposition: A | Discharge status: Home / Self Care | Payer: Self-pay | Source: Ambulatory Visit | Attending: Obstetrics and Gynecology

## 2011-09-03 ENCOUNTER — Encounter (HOSPITAL_COMMUNITY): Payer: Self-pay | Admitting: *Deleted

## 2011-09-03 DIAGNOSIS — N950 Postmenopausal bleeding: Secondary | ICD-10-CM | POA: Insufficient documentation

## 2011-09-03 DIAGNOSIS — N840 Polyp of corpus uteri: Secondary | ICD-10-CM | POA: Insufficient documentation

## 2011-09-03 DIAGNOSIS — Z01818 Encounter for other preprocedural examination: Secondary | ICD-10-CM | POA: Insufficient documentation

## 2011-09-03 DIAGNOSIS — Z01812 Encounter for preprocedural laboratory examination: Secondary | ICD-10-CM | POA: Diagnosis not present

## 2011-09-03 HISTORY — PX: HYSTEROSCOPY: SHX211

## 2011-09-03 SURGERY — HYSTEROSCOPY
Anesthesia: Monitor Anesthesia Care | Site: Vagina | Wound class: Clean Contaminated

## 2011-09-03 MED ORDER — ESTROGENS, CONJUGATED 0.625 MG/GM VA CREA
TOPICAL_CREAM | VAGINAL | Status: DC | PRN
  Administered 2011-09-03: 1 via VAGINAL

## 2011-09-03 MED ORDER — PROPOFOL 10 MG/ML IV EMUL
INTRAVENOUS | Status: DC | PRN
  Administered 2011-09-03 (×4): 10 mg via INTRAVENOUS
  Administered 2011-09-03: 30 mg via INTRAVENOUS
  Administered 2011-09-03: 20 mg via INTRAVENOUS

## 2011-09-03 MED ORDER — FENTANYL CITRATE 0.05 MG/ML IJ SOLN
INTRAMUSCULAR | Status: AC
  Filled 2011-09-03: qty 2

## 2011-09-03 MED ORDER — SODIUM CHLORIDE 0.9 % IR SOLN
Status: DC | PRN
  Administered 2011-09-03: 3000 mL

## 2011-09-03 MED ORDER — MIDAZOLAM HCL 5 MG/5ML IJ SOLN
INTRAMUSCULAR | Status: DC | PRN
  Administered 2011-09-03 (×2): 0.5 mg via INTRAVENOUS

## 2011-09-03 MED ORDER — FENTANYL CITRATE 0.05 MG/ML IJ SOLN
INTRAMUSCULAR | Status: DC | PRN
  Administered 2011-09-03 (×2): 50 ug via INTRAVENOUS

## 2011-09-03 MED ORDER — LACTATED RINGERS IV SOLN
INTRAVENOUS | Status: DC
  Administered 2011-09-03: 1000 mL via INTRAVENOUS

## 2011-09-03 MED ORDER — LIDOCAINE HCL (CARDIAC) 20 MG/ML IV SOLN
INTRAVENOUS | Status: DC | PRN
  Administered 2011-09-03: 40 mg via INTRAVENOUS

## 2011-09-03 MED ORDER — HYDROMORPHONE HCL PF 1 MG/ML IJ SOLN: 0.2500 mg | INTRAMUSCULAR | Status: DC | PRN

## 2011-09-03 MED ORDER — LIDOCAINE HCL 1 % IJ SOLN
INTRAMUSCULAR | Status: DC | PRN
  Administered 2011-09-03: 10 mL

## 2011-09-03 MED ORDER — PROPOFOL 10 MG/ML IV EMUL
INTRAVENOUS | Status: AC
  Filled 2011-09-03: qty 20

## 2011-09-03 MED ORDER — MORPHINE SULFATE 10 MG/ML IJ SOLN: 0.0500 mg/kg | INTRAMUSCULAR | Status: DC | PRN

## 2011-09-03 MED ORDER — LIDOCAINE HCL (CARDIAC) 20 MG/ML IV SOLN
INTRAVENOUS | Status: AC
  Filled 2011-09-03: qty 5

## 2011-09-03 MED ORDER — MIDAZOLAM HCL 2 MG/2ML IJ SOLN
INTRAMUSCULAR | Status: AC
  Filled 2011-09-03: qty 2

## 2011-09-03 MED ORDER — ONDANSETRON HCL 4 MG/2ML IJ SOLN: 4.0000 mg | Freq: Once | INTRAMUSCULAR | Status: DC | PRN

## 2011-09-03 MED ORDER — ESTRADIOL 0.1 MG/GM VA CREA
TOPICAL_CREAM | VAGINAL | Status: AC
  Filled 2011-09-03: qty 42.5

## 2011-09-03 SURGICAL SUPPLY — 13 items
CANISTER SUCTION 2500CC (MISCELLANEOUS) ×3 IMPLANT
CATH ROBINSON RED A/P 16FR (CATHETERS) ×3 IMPLANT
CLOTH BEACON ORANGE TIMEOUT ST (SAFETY) ×3 IMPLANT
CONTAINER PREFILL 10% NBF 60ML (FORM) ×6 IMPLANT
ELECT REM PT RETURN 9FT ADLT (ELECTROSURGICAL) ×3
ELECTRODE REM PT RTRN 9FT ADLT (ELECTROSURGICAL) ×2 IMPLANT
GLOVE BIO SURGEON STRL SZ 6.5 (GLOVE) ×6 IMPLANT
GOWN PREVENTION PLUS LG XLONG (DISPOSABLE) ×3 IMPLANT
GOWN STRL REIN XL XLG (GOWN DISPOSABLE) ×3 IMPLANT
LOOP ANGLED CUTTING 22FR (CUTTING LOOP) IMPLANT
PACK HYSTEROSCOPY LF (CUSTOM PROCEDURE TRAY) ×3 IMPLANT
TOWEL OR 17X24 6PK STRL BLUE (TOWEL DISPOSABLE) ×6 IMPLANT
WATER STERILE IRR 1000ML POUR (IV SOLUTION) ×3 IMPLANT

## 2011-09-03 NOTE — Anesthesia Postprocedure Evaluation (Signed)
  Anesthesia Post-op Note  Patient: Kristin Mack  Procedure(s) Performed: Procedure(s) (LRB): HYSTEROSCOPY (N/A)  Patient Location: PACU  Anesthesia Type: MAC  Level of Consciousness: awake, alert  and oriented  Airway and Oxygen Therapy: Patient Spontanous Breathing  Post-op Pain: none  Post-op Assessment: Post-op Vital signs reviewed, Patient's Cardiovascular Status Stable, Respiratory Function Stable, Patent Airway, No signs of Nausea or vomiting and Pain level controlled  Post-op Vital Signs: Reviewed and stable  Complications: No apparent anesthesia complications

## 2011-09-03 NOTE — Brief Op Note (Signed)
09/03/2011  8:36 AM  PATIENT:  Leretha Dykes Hefel  71 y.o. female  PRE-OPERATIVE DIAGNOSIS:  Postmenopausal Bleeding  POST-OPERATIVE DIAGNOSIS:  Postmenopausal Bleeding  PROCEDURE:  Procedure(s) (LRB): HYSTEROSCOPY (N/A) and resection of polyp with Truclear  SURGEON:  Surgeon(s) and Role:    * Oliver Pila, MD - Primary  PHYSICIAN ASSISTANT:    ANESTHESIA:   IV sedation and paracervical block  EBL:  Total I/O In: 600 [I.V.:600] Out: 25 [Urine:25] Hysteroscopic deficit 150cc  BLOOD ADMINISTERED:none  DRAINS: none   LOCAL MEDICATIONS USED:  LIDOCAINE   SPECIMEN:  Morcellated polyp  DISPOSITION OF SPECIMEN:  PATHOLOGY  COUNTS:  YES  TOURNIQUET:  * No tourniquets in log *  DICTATION: .Dragon Dictation  PLAN OF CARE: Discharge to home after PACU  PATIENT DISPOSITION:  PACU - hemodynamically stable.

## 2011-09-03 NOTE — Op Note (Signed)
Operative report  Preoperative diagnosis Postmenopausal bleeding Large endometrial polyp  Postoperative diagnosis Same  Procedure Hysteroscopy with truclear morcellation of endometrial polyp  Surgeon Dr. Huel Cote  Anesthesia IV sedation Paracervical block  Fluids Estimated blood loss minimal Hysteroscopic deficit 250 cc normal saline  Findings There was a large endometrial polyp completely filling the entire cavity measuring 2 x 3 cm on ultrasound prior to surgery. This was morcellated in its entirety and sent to pathology The remainder of the cavity appeared atrophic and normal  Pathology Morcellated polyp was sent  Procedure note After informed consent was obtained the patient was taken to the operating room where IV sedation was obtained without difficulty. An appropriate time out was performed and she was then prepped and draped in the normal sterile fashion in the dorsal lithotomy position. A long Graves speculum was introduced into the vagina and the cervix identified and grasped on the anterior lip with a single-tooth tenaculum.A paracervical block was performed with 2 cc on the anterior lip and an additional 9 cc each at 2 and 10:00. The uterine sound was then in easily introduced into the cavity and the cervix dilated to 17. The true clear camera was then introduced into the uterine cavity and a large polyp was seen extending down to the cervical os and just through the internal cervical os. With the camera in place the truclear blade was inserted into the cavity and the polyp morcellated under direct visualization. With the polyp had been completely morcellated the cavity appeared normal and there were no further polyps noted. The the scope was removed from the cervix and the single-tooth tenaculum removed as well. There is no active bleeding noted therefore the speculum was removed. There is a small abrasion where the patient had atrophic tissue at her posterior  vaginal introitus and estrogen cream was placed over this area. All instruments and sponge counts were correct and the patient was taken to the recovery room awake and in good condition.

## 2011-09-03 NOTE — Transfer of Care (Signed)
Immediate Anesthesia Transfer of Care Note  Patient: Kristin Mack  Procedure(s) Performed: Procedure(s) (LRB): HYSTEROSCOPY (N/A)  Patient Location: PACU  Anesthesia Type: MAC  Level of Consciousness: awake, alert  and oriented  Airway & Oxygen Therapy: Patient Spontanous Breathing  Post-op Assessment: Report given to PACU RN and Post -op Vital signs reviewed and stable  Post vital signs: stable  Complications: No apparent anesthesia complications

## 2011-09-03 NOTE — Discharge Instructions (Signed)

## 2011-09-03 NOTE — Anesthesia Postprocedure Evaluation (Signed)
  Anesthesia Post-op Note  Patient: Kristin Mack  Procedure(s) Performed: Procedure(s) (LRB): HYSTEROSCOPY (N/A)  Patient Location: PACU  Anesthesia Type: MAC  Level of Consciousness: awake, alert  and oriented  Airway and Oxygen Therapy: Patient Spontanous Breathing  Post-op Pain: mild  Post-op Assessment: Post-op Vital signs reviewed  Post-op Vital Signs: Reviewed and stable  Complications: No apparent anesthesia complications

## 2011-09-07 ENCOUNTER — Encounter (HOSPITAL_COMMUNITY): Payer: Self-pay | Admitting: Obstetrics and Gynecology

## 2011-09-16 DIAGNOSIS — F339 Major depressive disorder, recurrent, unspecified: Secondary | ICD-10-CM | POA: Diagnosis not present

## 2011-09-30 DIAGNOSIS — F339 Major depressive disorder, recurrent, unspecified: Secondary | ICD-10-CM | POA: Diagnosis not present

## 2011-10-13 DIAGNOSIS — L821 Other seborrheic keratosis: Secondary | ICD-10-CM | POA: Diagnosis not present

## 2011-10-13 DIAGNOSIS — M6281 Muscle weakness (generalized): Secondary | ICD-10-CM | POA: Diagnosis not present

## 2011-10-13 DIAGNOSIS — D239 Other benign neoplasm of skin, unspecified: Secondary | ICD-10-CM | POA: Diagnosis not present

## 2011-10-13 DIAGNOSIS — R279 Unspecified lack of coordination: Secondary | ICD-10-CM | POA: Diagnosis not present

## 2011-10-13 DIAGNOSIS — L570 Actinic keratosis: Secondary | ICD-10-CM | POA: Diagnosis not present

## 2011-10-13 DIAGNOSIS — N3941 Urge incontinence: Secondary | ICD-10-CM | POA: Diagnosis not present

## 2011-10-14 DIAGNOSIS — F339 Major depressive disorder, recurrent, unspecified: Secondary | ICD-10-CM | POA: Diagnosis not present

## 2011-10-19 DIAGNOSIS — L97509 Non-pressure chronic ulcer of other part of unspecified foot with unspecified severity: Secondary | ICD-10-CM | POA: Diagnosis not present

## 2011-10-19 DIAGNOSIS — B351 Tinea unguium: Secondary | ICD-10-CM | POA: Diagnosis not present

## 2011-10-19 DIAGNOSIS — L600 Ingrowing nail: Secondary | ICD-10-CM | POA: Diagnosis not present

## 2011-10-19 DIAGNOSIS — M79609 Pain in unspecified limb: Secondary | ICD-10-CM | POA: Diagnosis not present

## 2011-10-27 DIAGNOSIS — H33009 Unspecified retinal detachment with retinal break, unspecified eye: Secondary | ICD-10-CM | POA: Diagnosis not present

## 2011-10-28 DIAGNOSIS — F339 Major depressive disorder, recurrent, unspecified: Secondary | ICD-10-CM | POA: Diagnosis not present

## 2011-11-11 DIAGNOSIS — F339 Major depressive disorder, recurrent, unspecified: Secondary | ICD-10-CM | POA: Diagnosis not present

## 2011-11-24 DIAGNOSIS — Q828 Other specified congenital malformations of skin: Secondary | ICD-10-CM | POA: Diagnosis not present

## 2011-11-24 DIAGNOSIS — Q72899 Other reduction defects of unspecified lower limb: Secondary | ICD-10-CM | POA: Diagnosis not present

## 2011-11-24 DIAGNOSIS — M2020 Hallux rigidus, unspecified foot: Secondary | ICD-10-CM | POA: Diagnosis not present

## 2011-11-30 DIAGNOSIS — L738 Other specified follicular disorders: Secondary | ICD-10-CM | POA: Diagnosis not present

## 2011-11-30 DIAGNOSIS — L821 Other seborrheic keratosis: Secondary | ICD-10-CM | POA: Diagnosis not present

## 2011-11-30 DIAGNOSIS — L678 Other hair color and hair shaft abnormalities: Secondary | ICD-10-CM | POA: Diagnosis not present

## 2011-12-03 DIAGNOSIS — L97909 Non-pressure chronic ulcer of unspecified part of unspecified lower leg with unspecified severity: Secondary | ICD-10-CM | POA: Diagnosis not present

## 2011-12-03 DIAGNOSIS — R319 Hematuria, unspecified: Secondary | ICD-10-CM | POA: Diagnosis not present

## 2011-12-03 DIAGNOSIS — N760 Acute vaginitis: Secondary | ICD-10-CM | POA: Diagnosis not present

## 2011-12-03 DIAGNOSIS — N9489 Other specified conditions associated with female genital organs and menstrual cycle: Secondary | ICD-10-CM | POA: Diagnosis not present

## 2011-12-03 DIAGNOSIS — N855 Inversion of uterus: Secondary | ICD-10-CM | POA: Diagnosis not present

## 2011-12-09 DIAGNOSIS — F339 Major depressive disorder, recurrent, unspecified: Secondary | ICD-10-CM | POA: Diagnosis not present

## 2011-12-11 DIAGNOSIS — M2010 Hallux valgus (acquired), unspecified foot: Secondary | ICD-10-CM | POA: Diagnosis not present

## 2011-12-11 DIAGNOSIS — R109 Unspecified abdominal pain: Secondary | ICD-10-CM | POA: Diagnosis not present

## 2011-12-15 DIAGNOSIS — F339 Major depressive disorder, recurrent, unspecified: Secondary | ICD-10-CM | POA: Diagnosis not present

## 2011-12-17 ENCOUNTER — Other Ambulatory Visit: Payer: Self-pay | Admitting: Cardiovascular Disease

## 2011-12-17 MED ORDER — FUROSEMIDE 40 MG PO TABS: 40.0000 mg | ORAL_TABLET | Freq: Every day | ORAL | Status: DC

## 2011-12-23 DIAGNOSIS — F339 Major depressive disorder, recurrent, unspecified: Secondary | ICD-10-CM | POA: Diagnosis not present

## 2012-01-06 DIAGNOSIS — F339 Major depressive disorder, recurrent, unspecified: Secondary | ICD-10-CM | POA: Diagnosis not present

## 2012-01-13 DIAGNOSIS — L98499 Non-pressure chronic ulcer of skin of other sites with unspecified severity: Secondary | ICD-10-CM | POA: Diagnosis not present

## 2012-01-18 ENCOUNTER — Ambulatory Visit: Payer: Medicare Other

## 2012-01-18 ENCOUNTER — Ambulatory Visit (INDEPENDENT_AMBULATORY_CARE_PROVIDER_SITE_OTHER): Payer: Medicare Other | Admitting: Family Medicine

## 2012-01-18 DIAGNOSIS — R0789 Other chest pain: Secondary | ICD-10-CM

## 2012-01-18 DIAGNOSIS — R071 Chest pain on breathing: Secondary | ICD-10-CM | POA: Diagnosis not present

## 2012-01-18 MED ORDER — HYDROCODONE-ACETAMINOPHEN 5-500 MG PO TABS: 0.5000 | ORAL_TABLET | Freq: Four times a day (QID) | ORAL | Status: DC | PRN
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
## 2012-01-19 ENCOUNTER — Ambulatory Visit
Admission: RE | Admit: 2012-01-19 | Discharge: 2012-01-19 | Disposition: A | Discharge status: Home / Self Care | Payer: Medicare Other | Source: Ambulatory Visit | Attending: Family Medicine | Admitting: Family Medicine

## 2012-01-19 DIAGNOSIS — J8410 Pulmonary fibrosis, unspecified: Secondary | ICD-10-CM | POA: Diagnosis not present

## 2012-01-19 DIAGNOSIS — R079 Chest pain, unspecified: Secondary | ICD-10-CM | POA: Diagnosis not present

## 2012-01-19 DIAGNOSIS — K449 Diaphragmatic hernia without obstruction or gangrene: Secondary | ICD-10-CM | POA: Diagnosis not present

## 2012-01-19 DIAGNOSIS — R0789 Other chest pain: Secondary | ICD-10-CM

## 2012-01-21 ENCOUNTER — Encounter: Payer: Self-pay | Admitting: Family Medicine

## 2012-01-29 ENCOUNTER — Emergency Department (HOSPITAL_BASED_OUTPATIENT_CLINIC_OR_DEPARTMENT_OTHER)
Admission: EM | Admit: 2012-01-29 | Discharge: 2012-01-29 | Disposition: A | Discharge status: Home / Self Care | Payer: Medicare Other | Attending: Emergency Medicine | Admitting: Emergency Medicine

## 2012-01-29 ENCOUNTER — Emergency Department (HOSPITAL_BASED_OUTPATIENT_CLINIC_OR_DEPARTMENT_OTHER): Payer: Medicare Other

## 2012-01-29 ENCOUNTER — Encounter (HOSPITAL_BASED_OUTPATIENT_CLINIC_OR_DEPARTMENT_OTHER): Payer: Self-pay | Admitting: *Deleted

## 2012-01-29 DIAGNOSIS — M542 Cervicalgia: Secondary | ICD-10-CM | POA: Insufficient documentation

## 2012-01-29 DIAGNOSIS — S0990XA Unspecified injury of head, initial encounter: Secondary | ICD-10-CM

## 2012-01-29 DIAGNOSIS — W1809XA Striking against other object with subsequent fall, initial encounter: Secondary | ICD-10-CM | POA: Insufficient documentation

## 2012-01-29 DIAGNOSIS — R51 Headache: Secondary | ICD-10-CM | POA: Insufficient documentation

## 2012-01-29 DIAGNOSIS — Z7982 Long term (current) use of aspirin: Secondary | ICD-10-CM | POA: Diagnosis not present

## 2012-01-29 DIAGNOSIS — I509 Heart failure, unspecified: Secondary | ICD-10-CM | POA: Insufficient documentation

## 2012-01-29 DIAGNOSIS — I2510 Atherosclerotic heart disease of native coronary artery without angina pectoris: Secondary | ICD-10-CM | POA: Insufficient documentation

## 2012-01-29 DIAGNOSIS — Z043 Encounter for examination and observation following other accident: Secondary | ICD-10-CM | POA: Diagnosis not present

## 2012-01-29 DIAGNOSIS — I10 Essential (primary) hypertension: Secondary | ICD-10-CM | POA: Insufficient documentation

## 2012-01-29 DIAGNOSIS — Z79899 Other long term (current) drug therapy: Secondary | ICD-10-CM | POA: Insufficient documentation

## 2012-01-29 MED ORDER — ACETAMINOPHEN 325 MG PO TABS
650.0000 mg | ORAL_TABLET | Freq: Once | ORAL | Status: AC
  Administered 2012-01-29: 650 mg via ORAL
  Filled 2012-01-29: qty 2

## 2012-01-29 NOTE — ED Provider Notes (Signed)
Medical screening examination/treatment/procedure(s) were conducted as a shared visit with non-physician practitioner(s) and myself.  I personally evaluated the patient during the encounter  Persistent headache after fall feel CT reasonable and Pt agrees.  Hurman Horn, MD 01/30/12 [phone]

## 2012-01-29 NOTE — ED Notes (Signed)
Pt c/o h/a x 3 days fall with head injury x 5 days ago.

## 2012-02-03 DIAGNOSIS — F339 Major depressive disorder, recurrent, unspecified: Secondary | ICD-10-CM | POA: Diagnosis not present

## 2012-03-02 DIAGNOSIS — F339 Major depressive disorder, recurrent, unspecified: Secondary | ICD-10-CM | POA: Diagnosis not present

## 2012-03-16 DIAGNOSIS — F339 Major depressive disorder, recurrent, unspecified: Secondary | ICD-10-CM | POA: Diagnosis not present

## 2012-03-17 DIAGNOSIS — I10 Essential (primary) hypertension: Secondary | ICD-10-CM | POA: Diagnosis not present

## 2012-03-17 DIAGNOSIS — E785 Hyperlipidemia, unspecified: Secondary | ICD-10-CM | POA: Diagnosis not present

## 2012-03-17 DIAGNOSIS — R82998 Other abnormal findings in urine: Secondary | ICD-10-CM | POA: Diagnosis not present

## 2012-03-17 DIAGNOSIS — E559 Vitamin D deficiency, unspecified: Secondary | ICD-10-CM | POA: Diagnosis not present

## 2012-03-18 ENCOUNTER — Other Ambulatory Visit: Payer: Self-pay | Admitting: Cardiovascular Disease

## 2012-03-21 NOTE — Telephone Encounter (Signed)
Fax Received. Refill Completed.  [place] (R.M.A)   

## 2012-03-24 DIAGNOSIS — E785 Hyperlipidemia, unspecified: Secondary | ICD-10-CM | POA: Diagnosis not present

## 2012-03-24 DIAGNOSIS — Z0000 Encounter for general adult medical examination without abnormal findings: Secondary | ICD-10-CM | POA: Diagnosis not present

## 2012-03-24 DIAGNOSIS — E669 Obesity, unspecified: Secondary | ICD-10-CM | POA: Diagnosis not present

## 2012-03-24 DIAGNOSIS — I10 Essential (primary) hypertension: Secondary | ICD-10-CM | POA: Diagnosis not present

## 2012-03-24 NOTE — ED Provider Notes (Signed)
History     CSN: [id number]  Arrival date & time 01/29/12  1328   First MD Initiated Contact with Patient 01/29/12 1459      Chief Complaint  Patient presents with  . Head Injury    (Consider location/radiation/quality/duration/timing/severity/associated sxs/prior treatment) HPI Comments: Kristin Mack presents with persistent generalized headache for the past 3 days.  She reports tripping and falling 5 days ago hitting her head against a wall in her home.  There has been no fevers, chills, syncope, confusion or localized weakness.  The patient has tried tylenol without relief of symptoms.   The history is provided by the patient.    Past Medical History  Diagnosis Date  . Coronary artery disease   . CHF (congestive heart failure)     Dilated cardiomyopathy. Her original ejection fraction was 10-20%. Her ejection fraction has improved to 57% by 2009  . Hypertension   . Dyslipidemia   . Chest pain   . Myocardial infarction   . Depression   . Obesity   . Headache   . Carpal tunnel syndrome     Left Hand  . GERD (gastroesophageal reflux disease)   . Lower back pain   . Hemorrhoids   . History of anxiety   . Osteoarthritis     Severe with collapse of the left hip  . Foot drop, left     Past Surgical History  Procedure Date  . Tubal ligation   . Cardiac catheterization     Ejection Fraction was around 45-50%  . Replacement total hip w/  resurfacing implants 2002    left  . Hysteroscopy 09/03/2011    Procedure: HYSTEROSCOPY;  Surgeon: Oliver Pila, MD;  Location: [facility];  Service: Gynecology;  Laterality: N/A;  With Resection of polyp with truclear system    Family History  Problem Relation Age of Onset  . Stroke Mother   . Cancer Sister   . Stroke Maternal Grandfather     History  Substance Use Topics  . Smoking status: Never Smoker   . Smokeless tobacco: Not on file  . Alcohol Use: No    OB History    Grav Para Term Preterm Abortions TAB SAB  Ect Mult Living                  Review of Systems  Constitutional: Negative for fever, chills and fatigue.  HENT: Negative for congestion, sore throat, neck pain and tinnitus.   Eyes: Negative.   Respiratory: Negative for chest tightness and shortness of breath.   Cardiovascular: Negative for chest pain.  Gastrointestinal: Negative for nausea and abdominal pain.  Genitourinary: Negative.   Musculoskeletal: Negative for joint swelling and arthralgias.  Skin: Negative.  Negative for rash and wound.  Neurological: Positive for headaches. Negative for dizziness, weakness, light-headedness and numbness.  Hematological: Negative.   Psychiatric/Behavioral: Negative.     Allergies  Review of patient's allergies indicates no known allergies.  Home Medications   Current Outpatient Rx  Name Route Sig Dispense Refill  . ASPIRIN 81 MG PO TABS Oral Take 81 mg by mouth daily.     Marland Kitchen CALCIUM CITRATE-VITAMIN D 200-250 MG-UNIT PO TABS Oral Take 1 tablet by mouth daily.    Marland Kitchen CINNAMON PO Oral Take 1 tablet by mouth daily. Cinnamon 1,000 mg tablet    . FUROSEMIDE 40 MG PO TABS Oral Take 1 tablet (40 mg total) by mouth daily. 30 tablet 5  . HYDROCODONE-ACETAMINOPHEN 5-500 MG PO TABS  Oral Take 0.5 tablets by mouth every 6 (six) hours as needed. For pain    . MULTI-VITAMIN PO Oral Take 1 tablet by mouth daily.     Marland Kitchen PANTOPRAZOLE SODIUM 40 MG PO TBEC Oral Take 40 mg by mouth daily.     . VENLAFAXINE HCL ER 150 MG PO CP24 Oral Take 150 mg by mouth daily.     Marland Kitchen CARVEDILOL 12.5 MG PO TABS  take 1 tablet by mouth twice a day 60 tablet 5  . CRESTOR 20 MG PO TABS  take 1 tablet by mouth once daily 30 each 5  . LISINOPRIL 10 MG PO TABS  take 1 tablet by mouth once daily 30 tablet 5  . LORAZEPAM 0.5 MG PO TABS Oral Take 0.5 mg by mouth daily as needed. For anxiety    . NAPROXEN 500 MG PO TABS Oral Take 250 mg by mouth daily as needed. For pain    . NITROSTAT 0.4 MG SL SUBL  PLACE 1 TABLET UNDER TONGUE IF  NEEDED FOR CHEST PAIN 25 tablet 11  . FISH OIL 1200 MG PO CAPS Oral Take 1 capsule by mouth daily.      BP 121/68  Pulse 64  Temp 98.7 F (37.1 C) (Oral)  Resp 20  Ht 5\' 7"  (1.702 m)  Wt 250 lb (113.399 kg)  BMI 39.16 kg/m2  SpO2 99%  Physical Exam  Nursing note and vitals reviewed. Constitutional: She is oriented to person, place, and time. She appears well-developed and well-nourished.  HENT:  Head: Normocephalic and atraumatic.  Mouth/Throat: Oropharynx is clear and moist.       No visible or palpable scalp trauma.  Eyes: EOM are normal. Pupils are equal, round, and reactive to light.  Neck: Normal range of motion. Neck supple.  Cardiovascular: Normal rate and normal heart sounds.   Pulmonary/Chest: Effort normal.  Abdominal: Soft. There is no tenderness.  Musculoskeletal: Normal range of motion.  Lymphadenopathy:    She has no cervical adenopathy.  Neurological: She is alert and oriented to person, place, and time. She has normal strength. No sensory deficit. Gait normal. GCS eye subscore is 4. GCS verbal subscore is 5. GCS motor subscore is 6.       Normal heel-shin, normal rapid alternating movements. Cranial nerves III-XII intact.  No pronator drift.  Skin: Skin is warm and dry. No rash noted.  Psychiatric: She has a normal mood and affect. Her speech is normal and behavior is normal. Thought content normal. Cognition and memory are normal.    ED Course  Procedures (including critical care time)  Labs Reviewed - No data to display No results found.   1. Headache   2. Minor head injury       MDM  Ct reviewed and discussed with patient.  The patient appears reasonably screened and/or stabilized for discharge and I doubt any other medical condition or other [facility] requiring further screening, evaluation, or treatment in the ED at this time prior to discharge.          Burgess Amor, [place] 03/24/12 [phone]

## 2012-03-25 NOTE — ED Provider Notes (Signed)
Medical screening examination/treatment/procedure(s) were performed by non-physician practitioner and as supervising physician I was immediately available for consultation/collaboration.   Hurman Horn, MD 03/25/12 [phone]

## 2012-03-30 DIAGNOSIS — F339 Major depressive disorder, recurrent, unspecified: Secondary | ICD-10-CM | POA: Diagnosis not present

## 2012-04-05 DIAGNOSIS — H33059 Total retinal detachment, unspecified eye: Secondary | ICD-10-CM | POA: Diagnosis not present

## 2012-04-05 DIAGNOSIS — H52209 Unspecified astigmatism, unspecified eye: Secondary | ICD-10-CM | POA: Diagnosis not present

## 2012-04-12 ENCOUNTER — Ambulatory Visit: Payer: Medicare Other | Admitting: Cardiovascular Disease

## 2012-04-15 ENCOUNTER — Ambulatory Visit (INDEPENDENT_AMBULATORY_CARE_PROVIDER_SITE_OTHER): Payer: Medicare Other | Admitting: Cardiovascular Disease

## 2012-04-15 ENCOUNTER — Encounter: Payer: Self-pay | Admitting: Cardiovascular Disease

## 2012-04-15 VITALS — BP 107/65 | HR 82 | Resp 18 | Ht 68.0 in | Wt 254.8 lb

## 2012-04-15 DIAGNOSIS — I509 Heart failure, unspecified: Secondary | ICD-10-CM

## 2012-04-15 NOTE — Progress Notes (Signed)
Kristin Mack Date of Birth  [birth year] [facility]  [address]    [address]   [address] [place], [place]  [address]    [place], [place]  [address] [phone]  Fax  [phone]  [phone]  Fax [phone]  Problem List 1. CHF- LV EF has improved dramatically 2. Hyperlipidemia 3.  History of Present Illness:  71 year old female with history of congestive heart failure. Her original ejection fraction was 10-20%. Her ejection fraction has dramatically improved and is now 57% ( Echo 2009). She had an echocardiogram in February 2011 that revealed normal left ventricle systolic function.  She's had 2 heart catheterizations. Her last cardiac cath was in 2006 which revealed moderate irregularities.  She's not having any episodes of chest pain or shortness breath. She works out a regular basis at least 2-3 times a week.  She has gained 20 lbs over the past several months.  She has a foot ulcer in her left foot and has not been in the pool.  She's not had any angina-like chest pain. She did have some stress-related chest discomfort around Christmastime but that has since resolved.   Current Outpatient Prescriptions on File Prior to Visit  Medication Sig Dispense Refill  . aspirin 81 MG tablet Take 81 mg by mouth daily.       . Calcium Citrate-Vitamin D 200-250 MG-UNIT TABS Take 1 tablet by mouth daily.      . carvedilol (COREG) 12.5 MG tablet take 1 tablet by mouth twice a day  60 tablet  5  . CRESTOR 20 MG tablet take 1 tablet by mouth once daily  30 each  5  . furosemide (LASIX) 40 MG tablet Take 1 tablet (40 mg total) by mouth daily.  30 tablet  5  . HYDROcodone-acetaminophen (VICODIN) 5-500 MG per tablet Take 0.5 tablets by mouth every 6 (six) hours as needed. For pain      . lisinopril (PRINIVIL,ZESTRIL) 10 MG tablet take 1 tablet by mouth once daily  30 tablet  5  . LORazepam (ATIVAN) 0.5 MG tablet Take 0.5 mg by mouth daily as  needed. For anxiety      . Multiple Vitamin (MULTI-VITAMIN PO) Take 1 tablet by mouth daily.       . naproxen (NAPROSYN) 500 MG tablet Take 250 mg by mouth daily as needed. For pain      . NITROSTAT 0.4 MG SL tablet PLACE 1 TABLET UNDER TONGUE IF NEEDED FOR CHEST PAIN  25 tablet  11  . pantoprazole (PROTONIX) 40 MG tablet Take 40 mg by mouth daily.       Marland Kitchen venlafaxine (EFFEXOR-XR) 150 MG 24 hr capsule Take 150 mg by mouth daily.       Marland Kitchen CINNAMON PO Take 1 tablet by mouth daily. Cinnamon 1,000 mg tablet      . Omega-3 Fatty Acids (FISH OIL) 1200 MG CAPS Take 1 capsule by mouth daily.        No Known Allergies  Past Medical History  Diagnosis Date  . Coronary artery disease   . CHF (congestive heart failure)     Dilated cardiomyopathy. Her original ejection fraction was 10-20%. Her ejection fraction has improved to 57% by 2009  . Hypertension   . Dyslipidemia   . Chest pain   . Myocardial infarction   . Depression   . Obesity   . Headache   . Carpal tunnel syndrome  Left Hand  . GERD (gastroesophageal reflux disease)   . Lower back pain   . Hemorrhoids   . History of anxiety   . Osteoarthritis     Severe with collapse of the left hip  . Foot drop, left     Past Surgical History  Procedure Date  . Tubal ligation   . Cardiac catheterization     Ejection Fraction was around 45-50%  . Replacement total hip w/  resurfacing implants 2002    left  . Hysteroscopy 09/03/2011    Procedure: HYSTEROSCOPY;  Surgeon: Oliver Pila, MD;  Location: [facility];  Service: Gynecology;  Laterality: N/A;  With Resection of polyp with truclear system    History  Smoking status  . Never Smoker   Smokeless tobacco  . Not on file    History  Alcohol Use No    Family History  Problem Relation Age of Onset  . Stroke Mother   . Cancer Sister   . Stroke Maternal Grandfather     Reviw of Systems:  Reviewed in the HPI.  All other systems are negative.  Physical Exam: BP 107/65   Pulse 82  Resp 18  Ht 5\' 8"  (1.727 m)  Wt 254 lb 12.8 oz (115.577 kg)  BMI 38.74 kg/m2  SpO2 98% The patient is alert and oriented x 3.  The mood and affect are normal.   Skin: warm and dry.  Color is normal.    HEENT:   Normocephalic/atraumatic. She is no JVD. She has normal carotid arteries. Her neck is supple.  Lungs: Lungs are clear  Heart: Regular rate S1-S2   Abdomen: She is mildly obese. No hepatosplenomegaly  Extremities:  No clubbing cyanosis or edema.  Neuro:  Neurologic exam is nonfocal.    ECG: Sinus rhythm. She has premature atrial contractions with apparent conduction. She has nonspecific ST-T wave abnormalities  Assessment / Plan:

## 2012-04-15 NOTE — Patient Instructions (Addendum)
Your physician wants you to follow-up in: 6 Months You will receive a reminder letter in the mail two months in advance. If you don't receive a letter, please call our office to schedule the follow-up appointment.  Your physician recommends that you continue on your current medications as directed. Please refer to the Current Medication list given to you today.   

## 2012-04-15 NOTE — Assessment & Plan Note (Signed)
Kristin Mack is doing well.  She has not had any symptoms of CHf recently.  We will continue her current meds.  I will see her again in 6 months for OV and fasting labs

## 2012-04-18 ENCOUNTER — Encounter: Payer: Self-pay | Admitting: Cardiovascular Disease

## 2012-04-20 ENCOUNTER — Encounter: Payer: Self-pay | Admitting: Cardiovascular Disease

## 2012-04-27 DIAGNOSIS — F339 Major depressive disorder, recurrent, unspecified: Secondary | ICD-10-CM | POA: Diagnosis not present

## 2012-05-11 DIAGNOSIS — F339 Major depressive disorder, recurrent, unspecified: Secondary | ICD-10-CM | POA: Diagnosis not present

## 2012-05-11 DIAGNOSIS — L98499 Non-pressure chronic ulcer of skin of other sites with unspecified severity: Secondary | ICD-10-CM | POA: Diagnosis not present

## 2012-05-11 DIAGNOSIS — M6240 Contracture of muscle, unspecified site: Secondary | ICD-10-CM | POA: Diagnosis not present

## 2012-05-11 DIAGNOSIS — M216X9 Other acquired deformities of unspecified foot: Secondary | ICD-10-CM | POA: Diagnosis not present

## 2012-05-20 ENCOUNTER — Other Ambulatory Visit: Payer: Self-pay | Admitting: *Deleted

## 2012-05-20 MED ORDER — NITROGLYCERIN 0.4 MG SL SUBL: 0.4000 mg | SUBLINGUAL_TABLET | SUBLINGUAL | Status: DC | PRN

## 2012-05-23 DIAGNOSIS — L98499 Non-pressure chronic ulcer of skin of other sites with unspecified severity: Secondary | ICD-10-CM | POA: Diagnosis not present

## 2012-05-25 DIAGNOSIS — F339 Major depressive disorder, recurrent, unspecified: Secondary | ICD-10-CM | POA: Diagnosis not present

## 2012-06-06 DIAGNOSIS — M216X9 Other acquired deformities of unspecified foot: Secondary | ICD-10-CM | POA: Diagnosis not present

## 2012-06-06 DIAGNOSIS — L98499 Non-pressure chronic ulcer of skin of other sites with unspecified severity: Secondary | ICD-10-CM | POA: Diagnosis not present

## 2012-06-09 DIAGNOSIS — F339 Major depressive disorder, recurrent, unspecified: Secondary | ICD-10-CM | POA: Diagnosis not present

## 2012-06-13 DIAGNOSIS — F339 Major depressive disorder, recurrent, unspecified: Secondary | ICD-10-CM | POA: Diagnosis not present

## 2012-06-17 ENCOUNTER — Encounter: Payer: Self-pay | Admitting: Cardiovascular Disease

## 2012-06-20 DIAGNOSIS — L98499 Non-pressure chronic ulcer of skin of other sites with unspecified severity: Secondary | ICD-10-CM | POA: Diagnosis not present

## 2012-06-20 DIAGNOSIS — L97509 Non-pressure chronic ulcer of other part of unspecified foot with unspecified severity: Secondary | ICD-10-CM | POA: Diagnosis not present

## 2012-06-30 ENCOUNTER — Other Ambulatory Visit: Payer: Self-pay | Admitting: *Deleted

## 2012-06-30 MED ORDER — FUROSEMIDE 40 MG PO TABS: 40.0000 mg | ORAL_TABLET | Freq: Every day | ORAL | Status: DC

## 2012-06-30 NOTE — Telephone Encounter (Signed)
Fax Received. Refill Completed.  [place] (R.M.A)   

## 2012-07-26 ENCOUNTER — Other Ambulatory Visit: Payer: Self-pay | Admitting: Obstetrics and Gynecology

## 2012-07-26 DIAGNOSIS — Z1231 Encounter for screening mammogram for malignant neoplasm of breast: Secondary | ICD-10-CM

## 2012-07-27 ENCOUNTER — Telehealth: Payer: Self-pay | Admitting: Cardiovascular Disease

## 2012-07-27 ENCOUNTER — Other Ambulatory Visit: Payer: Self-pay | Admitting: *Deleted

## 2012-07-27 DIAGNOSIS — E785 Hyperlipidemia, unspecified: Secondary | ICD-10-CM

## 2012-07-27 NOTE — Telephone Encounter (Signed)
New problem    Need lab order put into the system prior to appt in April  .

## 2012-07-27 NOTE — Progress Notes (Signed)
Lab orders placed.  

## 2012-08-18 ENCOUNTER — Other Ambulatory Visit: Payer: Self-pay | Admitting: *Deleted

## 2012-08-18 ENCOUNTER — Ambulatory Visit
Admission: RE | Admit: 2012-08-18 | Discharge: 2012-08-18 | Disposition: A | Discharge status: Home / Self Care | Payer: Medicare Other | Source: Ambulatory Visit | Attending: Obstetrics and Gynecology | Admitting: Obstetrics and Gynecology

## 2012-08-18 DIAGNOSIS — Z1231 Encounter for screening mammogram for malignant neoplasm of breast: Secondary | ICD-10-CM

## 2012-08-18 MED ORDER — LISINOPRIL 10 MG PO TABS: 10.0000 mg | ORAL_TABLET | Freq: Every day | ORAL | Status: DC

## 2012-08-18 NOTE — Telephone Encounter (Signed)
Fax Received. Refill Completed.  [place] (R.M.A)   

## 2012-08-19 ENCOUNTER — Other Ambulatory Visit: Payer: Self-pay | Admitting: *Deleted

## 2012-08-19 MED ORDER — LISINOPRIL 10 MG PO TABS: 10.0000 mg | ORAL_TABLET | Freq: Every day | ORAL | Status: DC

## 2012-08-20 ENCOUNTER — Ambulatory Visit (INDEPENDENT_AMBULATORY_CARE_PROVIDER_SITE_OTHER): Payer: Medicare Other | Admitting: Emergency Medicine

## 2012-08-20 VITALS — BP 119/70 | HR 90 | Temp 99.3°F | Resp 17 | Ht 67.5 in | Wt 241.0 lb

## 2012-08-20 DIAGNOSIS — J209 Acute bronchitis, unspecified: Secondary | ICD-10-CM | POA: Diagnosis not present

## 2012-08-20 DIAGNOSIS — IMO0001 Reserved for inherently not codable concepts without codable children: Secondary | ICD-10-CM

## 2012-08-20 MED ORDER — PROMETHAZINE-PHENYLEPHRINE 6.25-5 MG/5ML PO SYRP: 5.0000 mL | ORAL_SOLUTION | ORAL | Status: DC | PRN

## 2012-08-20 MED ORDER — PSEUDOEPHEDRINE-GUAIFENESIN ER 60-600 MG PO TB12: 1.0000 | ORAL_TABLET | Freq: Two times a day (BID) | ORAL | Status: DC

## 2012-08-20 NOTE — Progress Notes (Signed)
[facility] [address], [place] [address] [phone]  Date:  08/20/2012   Name:  Kristin Mack   DOB:  August 04, 1940   MRN:  [id number]  PCP:  Garlan Fillers, MD    Chief Complaint: Nasal Congestion and Cough   History of Present Illness:  Kristin Mack is a 72 y.o. very pleasant female patient who presents with the following:  Ill since the middle of the week with nasal congestion and cough, both productive of mucoid discharge.  No fever but chilled.  No wheezing or shortness of breath.  No improvement with OTC medications.  Sick contacts at home.  No nausea or vomiting or rash.  Patient Active Problem List  Diagnosis  . Carpal tunnel syndrome  . GERD (gastroesophageal reflux disease)  . CHF (congestive heart failure)  . Hyperlipidemia    Past Medical History  Diagnosis Date  . Coronary artery disease   . CHF (congestive heart failure)     Dilated cardiomyopathy. Her original ejection fraction was 10-20%. Her ejection fraction has improved to 57% by 2009  . Hypertension   . Dyslipidemia   . Chest pain   . Myocardial infarction   . Depression   . Obesity   . Headache   . Carpal tunnel syndrome     Left Hand  . GERD (gastroesophageal reflux disease)   . Lower back pain   . Hemorrhoids   . History of anxiety   . Osteoarthritis     Severe with collapse of the left hip  . Foot drop, left     Past Surgical History  Procedure Laterality Date  . Tubal ligation    . Cardiac catheterization      Ejection Fraction was around 45-50%  . Replacement total hip w/  resurfacing implants  2002    left  . Hysteroscopy  09/03/2011    Procedure: HYSTEROSCOPY;  Surgeon: Oliver Pila, MD;  Location: [facility];  Service: Gynecology;  Laterality: N/A;  With Resection of polyp with truclear system    History  Substance Use Topics  . Smoking status: Never Smoker   . Smokeless tobacco: Not on file  . Alcohol Use: No    Family History   Problem Relation Age of Onset  . Stroke Mother   . Cancer Sister   . Stroke Maternal Grandfather     No Known Allergies  Medication list has been reviewed and updated.  Current Outpatient Prescriptions on File Prior to Visit  Medication Sig Dispense Refill  . aspirin 81 MG tablet Take 81 mg by mouth daily.       . Calcium Citrate-Vitamin D 200-250 MG-UNIT TABS Take 1 tablet by mouth daily.      . carvedilol (COREG) 12.5 MG tablet take 1 tablet by mouth twice a day  60 tablet  5  . CRESTOR 20 MG tablet take 1 tablet by mouth once daily  30 each  5  . furosemide (LASIX) 40 MG tablet Take 1 tablet (40 mg total) by mouth daily.  30 tablet  5  . HYDROcodone-acetaminophen (VICODIN) 5-500 MG per tablet Take 0.5 tablets by mouth every 6 (six) hours as needed. For pain      . lisinopril (PRINIVIL,ZESTRIL) 10 MG tablet Take 1 tablet (10 mg total) by mouth daily.  30 tablet  5  . LORazepam (ATIVAN) 0.5 MG tablet Take 0.5 mg by mouth daily as needed. For anxiety      . naproxen (  NAPROSYN) 500 MG tablet Take 250 mg by mouth daily as needed. For pain      . nitroGLYCERIN (NITROSTAT) 0.4 MG SL tablet Place 1 tablet (0.4 mg total) under the tongue every 5 (five) minutes as needed for chest pain.  25 tablet  3  . pantoprazole (PROTONIX) 40 MG tablet Take 40 mg by mouth daily.       Marland Kitchen venlafaxine (EFFEXOR-XR) 150 MG 24 hr capsule Take 150 mg by mouth daily.       . Vitamin D, Ergocalciferol, (DRISDOL) 50000 UNITS CAPS Take by mouth every 30 (thirty) days.      Marland Kitchen CINNAMON PO Take 1 tablet by mouth daily. Cinnamon 1,000 mg tablet      . Multiple Vitamin (MULTI-VITAMIN PO) Take 1 tablet by mouth daily.       . Omega-3 Fatty Acids (FISH OIL) 1200 MG CAPS Take 1 capsule by mouth daily.       No current facility-administered medications on file prior to visit.    Review of Systems:  As per HPI, otherwise negative.    Physical Examination: Filed Vitals:   08/20/12 1242  BP: 119/70  Pulse: 90   Temp: 99.3 F (37.4 C)  Resp: 17   Filed Vitals:   08/20/12 1242  Height: 5' 7.5" (1.715 m)  Weight: 241 lb (109.317 kg)   Body mass index is 37.17 kg/(m^2). Ideal Body Weight: Weight in (lb) to have BMI = 25: 161.7  GEN: WDWN, NAD, Non-toxic, A & O x 3 HEENT: Atraumatic, Normocephalic. Neck supple. No masses, No LAD. Ears and Nose: No external deformity. CV: RRR, No M/G/R. No JVD. No thrill. No extra heart sounds. PULM: CTA B, no wheezes, crackles, rhonchi. No retractions. No resp. distress. No accessory muscle use. ABD: S, NT, ND, +BS. No rebound. No HSM. EXTR: No c/c/e NEURO Normal gait.  PSYCH: Normally interactive. Conversant. Not depressed or anxious appearing.  Calm demeanor.    Assessment and Plan: Bronchitis tussionex mucinex d   Carmelina Dane, MD

## 2012-08-20 NOTE — Patient Instructions (Addendum)

## 2012-08-24 NOTE — Telephone Encounter (Signed)
New problem    Samples of crestor 20 mg  for 1 months

## 2012-08-25 ENCOUNTER — Other Ambulatory Visit: Payer: Self-pay | Admitting: *Deleted

## 2012-08-25 MED ORDER — ROSUVASTATIN CALCIUM 20 MG PO TABS: 20.0000 mg | ORAL_TABLET | Freq: Every day | ORAL | Status: DC

## 2012-08-25 NOTE — Telephone Encounter (Signed)
crestor 20 mg lot [id number] 01-2015 ,  1 month provided

## 2012-08-25 NOTE — Telephone Encounter (Signed)
Fax Received. Refill Completed.  [place] (R.M.A)   

## 2012-09-16 ENCOUNTER — Other Ambulatory Visit: Payer: Self-pay | Admitting: *Deleted

## 2012-09-16 MED ORDER — CARVEDILOL 12.5 MG PO TABS: 12.5000 mg | ORAL_TABLET | Freq: Two times a day (BID) | ORAL | Status: DC

## 2012-09-16 MED ORDER — NITROGLYCERIN 0.4 MG SL SUBL: 0.4000 mg | SUBLINGUAL_TABLET | SUBLINGUAL | Status: DC | PRN

## 2012-09-16 NOTE — Telephone Encounter (Signed)
Fax Received. Refill Completed.  [place] (R.M.A)   

## 2012-10-18 ENCOUNTER — Ambulatory Visit (INDEPENDENT_AMBULATORY_CARE_PROVIDER_SITE_OTHER): Payer: Medicare Other | Admitting: Cardiovascular Disease

## 2012-10-18 ENCOUNTER — Encounter: Payer: Self-pay | Admitting: Cardiovascular Disease

## 2012-10-18 VITALS — BP 118/70 | HR 65 | Ht 67.5 in | Wt 243.8 lb

## 2012-10-18 DIAGNOSIS — E785 Hyperlipidemia, unspecified: Secondary | ICD-10-CM

## 2012-10-18 DIAGNOSIS — I509 Heart failure, unspecified: Secondary | ICD-10-CM

## 2012-10-18 NOTE — Patient Instructions (Addendum)
Your physician has requested that you have an echocardiogram. Echocardiography is a painless test that uses sound waves to create images of your heart. It provides your doctor with information about the size and shape of your heart and how well your heart's chambers and valves are working. This procedure takes approximately one hour. There are no restrictions for this procedure.    Your physician wants you to follow-up in: 6 months You will receive a reminder letter in the mail two months in advance. If you don't receive a letter, please call our office to schedule the follow-up appointment.     Fasting Lab Work

## 2012-10-18 NOTE — Progress Notes (Signed)
Kristin Mack Date of Birth  Feb 11, 1941 [facility] Office  [address]    [address]   [address] [place], [place]  [address]    [place], [place]  [address] [phone]  Fax  [phone]  [phone]  Fax [phone]  Problem List 1. CHF- LV EF has improved dramatically 2. Hyperlipidemia 3. Left hip replacement  History of Present Illness:  72 year old female with history of congestive heart failure. Her original ejection fraction was 10-20%. Her ejection fraction has dramatically improved and is now 57% ( Echo 2009). She had an echocardiogram in February 2011 that revealed normal left ventricle systolic function.  She's had 2 heart catheterizations. Her last cardiac cath was in 2006 which revealed moderate irregularities.  She's not having any episodes of chest pain or shortness breath. She works out a regular basis at least 2-3 times a week.  She has gained 20 lbs over the past several months.  She has a foot ulcer in her left foot and has not been in the pool.  She's not had any angina-like chest pain. She did have some stress-related chest discomfort around Christmastime but that has since resolved.  October 18, 2012:  Kristin Mack has done well for the past 6 months.  She has not had any dyspnea.  She has had lots of left hip pain and has not been able to exercise as much.   She is swimming 4 times a week.   Current Outpatient Prescriptions on File Prior to Visit  Medication Sig Dispense Refill  . aspirin 81 MG tablet Take 81 mg by mouth daily.       . Calcium Citrate-Vitamin D 200-250 MG-UNIT TABS Take 1 tablet by mouth daily.      . carvedilol (COREG) 12.5 MG tablet Take 1 tablet (12.5 mg total) by mouth 2 (two) times daily with a meal.  60 tablet  5  . CINNAMON PO Take 1 tablet by mouth daily. Cinnamon 1,000 mg tablet      . furosemide (LASIX) 40 MG tablet Take 1 tablet (40 mg total) by mouth daily.  30 tablet  5  . lisinopril  (PRINIVIL,ZESTRIL) 10 MG tablet Take 1 tablet (10 mg total) by mouth daily.  30 tablet  5  . LORazepam (ATIVAN) 0.5 MG tablet Take 0.5 mg by mouth daily as needed. For anxiety      . Multiple Vitamin (MULTI-VITAMIN PO) Take 1 tablet by mouth daily.       . naproxen (NAPROSYN) 500 MG tablet Take 250 mg by mouth daily as needed. For pain      . nitroGLYCERIN (NITROSTAT) 0.4 MG SL tablet Place 1 tablet (0.4 mg total) under the tongue every 5 (five) minutes as needed for chest pain.  25 tablet  4  . Omega-3 Fatty Acids (FISH OIL) 1200 MG CAPS Take 1 capsule by mouth daily.      . pantoprazole (PROTONIX) 40 MG tablet Take 40 mg by mouth daily.       . rosuvastatin (CRESTOR) 20 MG tablet Take 1 tablet (20 mg total) by mouth daily.  30 tablet  5  . venlafaxine (EFFEXOR-XR) 150 MG 24 hr capsule Take 150 mg by mouth daily.       . Vitamin D, Ergocalciferol, (DRISDOL) 50000 UNITS CAPS Take by mouth every 30 (thirty) days.       No current facility-administered medications on file prior to visit.    No  Known Allergies  Past Medical History  Diagnosis Date  . Coronary artery disease   . CHF (congestive heart failure)     Dilated cardiomyopathy. Her original ejection fraction was 10-20%. Her ejection fraction has improved to 57% by 2009  . Hypertension   . Dyslipidemia   . Chest pain   . Myocardial infarction   . Depression   . Obesity   . Headache   . Carpal tunnel syndrome     Left Hand  . GERD (gastroesophageal reflux disease)   . Lower back pain   . Hemorrhoids   . History of anxiety   . Osteoarthritis     Severe with collapse of the left hip  . Foot drop, left     Past Surgical History  Procedure Laterality Date  . Tubal ligation    . Cardiac catheterization      Ejection Fraction was around 45-50%  . Replacement total hip w/  resurfacing implants  2002    left  . Hysteroscopy  09/03/2011    Procedure: HYSTEROSCOPY;  Surgeon: Oliver Pila, MD;  Location: [facility];  Service:  Gynecology;  Laterality: N/A;  With Resection of polyp with truclear system    History  Smoking status  . Never Smoker   Smokeless tobacco  . Not on file    History  Alcohol Use No    Family History  Problem Relation Age of Onset  . Stroke Mother   . Cancer Sister   . Stroke Maternal Grandfather     Reviw of Systems:  Reviewed in the HPI.  All other systems are negative.  Physical Exam: BP 118/70  Pulse 65  Ht 5' 7.5" (1.715 m)  Wt 243 lb 12.8 oz (110.587 kg)  BMI 37.6 kg/m2 The patient is alert and oriented x 3.  The mood and affect are normal.   Skin: warm and dry.  Color is normal.    HEENT:   Normocephalic/atraumatic. She is no JVD. She has normal carotid arteries. Her neck is supple.  Lungs: Lungs are clear  Heart: Regular rate S1-S2   Abdomen: She is mildly obese. No hepatosplenomegaly  Extremities:  No clubbing cyanosis or edema.  Neuro:  Neurologic exam is nonfocal.    ECG: 10/18/2012: Normal sinus rhythm at 65 beats a minute. She has occasional premature ventricular contractions.  Assessment / Plan:

## 2012-10-18 NOTE — Assessment & Plan Note (Addendum)
Kristin Mack has a history of congestive heart failure. Her ejection fraction has improved significantly. We will repeat her echocardiogram. Her last echocardiogram was in 2002. Her last ejection fraction by Myoview study was around 55-60%.  She may need to have her lipid hip replaced. Assuming that the echo looks okay, I think she would be at low risk for her upcoming hip surgery.  She has lipids, and hepatic profile, and basic metabolic follow her. We will have her come back to get these drawn at some point in the near future. I'll see her again in 6 months for followup office visit.

## 2012-10-20 ENCOUNTER — Other Ambulatory Visit (INDEPENDENT_AMBULATORY_CARE_PROVIDER_SITE_OTHER): Payer: Medicare Other

## 2012-10-20 DIAGNOSIS — E785 Hyperlipidemia, unspecified: Secondary | ICD-10-CM

## 2012-10-20 DIAGNOSIS — R0989 Other specified symptoms and signs involving the circulatory and respiratory systems: Secondary | ICD-10-CM

## 2012-10-20 LAB — BASIC METABOLIC PANEL
BUN: 19 mg/dL (ref 6–23)
CO2: 29 mEq/L (ref 19–32)
Calcium: 9 mg/dL (ref 8.4–10.5)
Chloride: 102 mEq/L (ref 96–112)
Creatinine, Ser: 0.8 mg/dL (ref 0.4–1.2)
GFR: 75.46 mL/min (ref >60.00)
Glucose, Bld: 91 mg/dL (ref 70–99)
Potassium: 4.6 mEq/L (ref 3.5–5.1)
Sodium: 134 mEq/L — ABNORMAL LOW (ref 135–145)

## 2012-10-20 LAB — HEPATIC FUNCTION PANEL
ALT: 19 U/L (ref 0–35)
AST: 22 U/L (ref 0–37)
Albumin: 4 g/dL (ref 3.5–5.2)
Alkaline Phosphatase: 73 U/L (ref 39–117)
Bilirubin, Direct: 0 mg/dL (ref 0.0–0.3)
Total Bilirubin: 0.7 mg/dL (ref 0.3–1.2)
Total Protein: 6.9 g/dL (ref 6.0–8.3)

## 2012-10-20 LAB — LIPID PANEL
Cholesterol: 130 mg/dL (ref 0–200)
HDL: 43.9 mg/dL (ref >39.00)
LDL Cholesterol: 51 mg/dL (ref 0–99)
Total CHOL/HDL Ratio: 3
Triglycerides: 178 mg/dL — ABNORMAL HIGH (ref 0.0–149.0)
VLDL: 35.6 mg/dL (ref 0.0–40.0)

## 2012-10-21 ENCOUNTER — Ambulatory Visit (HOSPITAL_COMMUNITY): Payer: Medicare Other | Attending: Internal Medicine

## 2012-10-21 DIAGNOSIS — I252 Old myocardial infarction: Secondary | ICD-10-CM | POA: Insufficient documentation

## 2012-10-21 DIAGNOSIS — I509 Heart failure, unspecified: Secondary | ICD-10-CM | POA: Insufficient documentation

## 2012-10-21 DIAGNOSIS — E785 Hyperlipidemia, unspecified: Secondary | ICD-10-CM | POA: Insufficient documentation

## 2012-10-21 DIAGNOSIS — I10 Essential (primary) hypertension: Secondary | ICD-10-CM | POA: Insufficient documentation

## 2012-10-21 DIAGNOSIS — E669 Obesity, unspecified: Secondary | ICD-10-CM | POA: Insufficient documentation

## 2012-10-21 NOTE — Progress Notes (Signed)
Echocardiogram performed.  

## 2012-11-18 ENCOUNTER — Other Ambulatory Visit: Payer: Self-pay | Admitting: Emergency Medicine

## 2012-11-29 ENCOUNTER — Other Ambulatory Visit (HOSPITAL_COMMUNITY): Payer: Self-pay | Admitting: *Deleted

## 2012-11-29 MED ORDER — FUROSEMIDE 40 MG PO TABS: 40.0000 mg | ORAL_TABLET | Freq: Every day | ORAL | Status: DC

## 2012-11-29 NOTE — Telephone Encounter (Signed)
Fax Received. Refill Completed.  [place] (R.M.A)   

## 2013-02-07 DIAGNOSIS — I509 Heart failure, unspecified: Secondary | ICD-10-CM | POA: Diagnosis not present

## 2013-02-07 DIAGNOSIS — K219 Gastro-esophageal reflux disease without esophagitis: Secondary | ICD-10-CM | POA: Diagnosis not present

## 2013-02-07 DIAGNOSIS — R059 Cough, unspecified: Secondary | ICD-10-CM | POA: Diagnosis not present

## 2013-02-07 DIAGNOSIS — R05 Cough: Secondary | ICD-10-CM | POA: Diagnosis not present

## 2013-03-21 ENCOUNTER — Other Ambulatory Visit: Payer: Self-pay

## 2013-03-21 MED ORDER — ROSUVASTATIN CALCIUM 20 MG PO TABS: 20.0000 mg | ORAL_TABLET | Freq: Every day | ORAL | Status: DC

## 2013-03-30 DIAGNOSIS — E876 Hypokalemia: Secondary | ICD-10-CM | POA: Insufficient documentation

## 2013-03-31 ENCOUNTER — Other Ambulatory Visit: Payer: Self-pay

## 2013-03-31 MED ORDER — CARVEDILOL 12.5 MG PO TABS: 12.5000 mg | ORAL_TABLET | Freq: Two times a day (BID) | ORAL | Status: DC

## 2013-04-25 ENCOUNTER — Other Ambulatory Visit: Payer: Self-pay

## 2013-04-25 MED ORDER — NITROGLYCERIN 0.4 MG SL SUBL: 0.4000 mg | SUBLINGUAL_TABLET | SUBLINGUAL | Status: DC | PRN

## 2013-05-15 ENCOUNTER — Encounter: Payer: Self-pay | Admitting: Cardiovascular Disease

## 2013-05-15 ENCOUNTER — Ambulatory Visit (INDEPENDENT_AMBULATORY_CARE_PROVIDER_SITE_OTHER): Payer: Medicare Other | Admitting: Cardiovascular Disease

## 2013-05-15 VITALS — BP 96/58 | HR 62 | Ht 66.0 in | Wt 217.6 lb

## 2013-05-15 DIAGNOSIS — I5022 Chronic systolic (congestive) heart failure: Secondary | ICD-10-CM

## 2013-05-15 DIAGNOSIS — E785 Hyperlipidemia, unspecified: Secondary | ICD-10-CM

## 2013-05-15 DIAGNOSIS — I509 Heart failure, unspecified: Secondary | ICD-10-CM

## 2013-05-15 NOTE — Progress Notes (Signed)
Kristin Mack Date of Birth  1941-01-25 [facility] Office  [address]    [address]   [address] [place], [place]  [address]    [place], [place]  [address] [phone]  Fax  [phone]  [phone]  Fax [phone]  Problem List 1. CHF- LV EF has improved dramatically 2. Hyperlipidemia 3. Left hip replacement  History of Present Illness:  72 year old female with history of congestive heart failure. Her original ejection fraction was 10-20%. Her ejection fraction has dramatically improved and is now 57% ( Echo 2009). She had an echocardiogram in February 2011 that revealed normal left ventricle systolic function.  She's had 2 heart catheterizations. Her last cardiac cath was in 2006 which revealed moderate irregularities.  She's not having any episodes of chest pain or shortness breath. She works out a regular basis at least 2-3 times a week.  She has gained 20 lbs over the past several months.  She has a foot ulcer in her left foot and has not been in the pool.  She's not had any angina-like chest pain. She did have some stress-related chest discomfort around Christmastime but that has since resolved.  October 18, 2012:  Kristin Mack has done well for the past 6 months.  She has not had any dyspnea.  She has had lots of left hip pain and has not been able to exercise as much.   She is swimming 4 times a week.  Nov. 24, 2014:  Kristin Mack is doing well.  She has been having some acid reflux ( dry cough).   She has been trying eating smaller portions, earing earlier in the evening, avoiding spicy foods.   Current Outpatient Prescriptions on File Prior to Visit  Medication Sig Dispense Refill  . aspirin 81 MG tablet Take 81 mg by mouth daily.       . Calcium Citrate-Vitamin D 200-250 MG-UNIT TABS Take 1 tablet by mouth daily.      . carvedilol (COREG) 12.5 MG tablet Take 1 tablet (12.5 mg total) by mouth 2 (two) times daily with a meal.  60 tablet   5  . CINNAMON PO Take 1 tablet by mouth daily. Cinnamon 1,000 mg tablet      . furosemide (LASIX) 40 MG tablet Take 1 tablet (40 mg total) by mouth daily.  30 tablet  5  . HYDROcodone-acetaminophen (NORCO/VICODIN) 5-325 MG per tablet Take 1 tablet by mouth every 6 (six) hours as needed for pain.      Marland Kitchen lisinopril (PRINIVIL,ZESTRIL) 10 MG tablet Take 1 tablet (10 mg total) by mouth daily.  30 tablet  5  . LORazepam (ATIVAN) 0.5 MG tablet Take 0.5 mg by mouth daily as needed. For anxiety      . Multiple Vitamin (MULTI-VITAMIN PO) Take 1 tablet by mouth daily.       . naproxen (NAPROSYN) 500 MG tablet Take 250 mg by mouth daily as needed. For pain      . nitroGLYCERIN (NITROSTAT) 0.4 MG SL tablet Place 1 tablet (0.4 mg total) under the tongue every 5 (five) minutes as needed for chest pain.  25 tablet  4  . Omega-3 Fatty Acids (FISH OIL) 1200 MG CAPS Take 1 capsule by mouth daily.      . pantoprazole (PROTONIX) 40 MG tablet Take 40 mg by mouth daily.       . rosuvastatin (CRESTOR) 20 MG tablet Take 1 tablet (20 mg total) by  mouth daily.  30 tablet  5  . venlafaxine (EFFEXOR-XR) 150 MG 24 hr capsule Take 150 mg by mouth daily.       . Vitamin D, Ergocalciferol, (DRISDOL) 50000 UNITS CAPS Take by mouth every 30 (thirty) days.       No current facility-administered medications on file prior to visit.    No Known Allergies  Past Medical History  Diagnosis Date  . Coronary artery disease   . CHF (congestive heart failure)     Dilated cardiomyopathy. Her original ejection fraction was 10-20%. Her ejection fraction has improved to 57% by 2009  . Hypertension   . Dyslipidemia   . Chest pain   . Myocardial infarction   . Depression   . Obesity   . Headache(784.0)   . Carpal tunnel syndrome     Left Hand  . GERD (gastroesophageal reflux disease)   . Lower back pain   . Hemorrhoids   . History of anxiety   . Osteoarthritis     Severe with collapse of the left hip  . Foot drop, left      Past Surgical History  Procedure Laterality Date  . Tubal ligation    . Cardiac catheterization      Ejection Fraction was around 45-50%  . Replacement total hip w/  resurfacing implants  2002    left  . Hysteroscopy  09/03/2011    Procedure: HYSTEROSCOPY;  Surgeon: Oliver Pila, MD;  Location: [facility];  Service: Gynecology;  Laterality: N/A;  With Resection of polyp with truclear system    History  Smoking status  . Never Smoker   Smokeless tobacco  . Not on file    History  Alcohol Use No    Family History  Problem Relation Age of Onset  . Stroke Mother   . Cancer Sister   . Stroke Maternal Grandfather     Reviw of Systems:  Reviewed in the HPI.  All other systems are negative.  Physical Exam: BP 96/58  Pulse 62  Ht 5\' 6"  (1.676 m)  Wt 217 lb 9.6 oz (98.703 kg)  BMI 35.14 kg/m2 The patient is alert and oriented x 3.  The mood and affect are normal.   Skin: warm and dry.  Color is normal.    HEENT:   Normocephalic/atraumatic. She is no JVD. She has normal carotid arteries. Her neck is supple.  Lungs: Lungs are clear  Heart: Regular rate S1-S2   Abdomen: She is mildly obese. No hepatosplenomegaly  Extremities:  No clubbing cyanosis or edema.  Neuro:  Neurologic exam is nonfocal.    ECG: 10/18/2012: Normal sinus rhythm at 65 beats a minute. She has occasional premature ventricular contractions.  Assessment / Plan:

## 2013-05-15 NOTE — Patient Instructions (Signed)
Your physician wants you to follow-up in: 6 months You will receive a reminder letter in the mail two months in advance. If you don't receive a letter, please call our office to schedule the follow-up appointment.  Your physician recommends that you return for a FASTING lipid profile: 6 months   Your physician recommends that you continue on your current medications as directed. Please refer to the Current Medication list given to you today.  

## 2013-05-15 NOTE — Assessment & Plan Note (Signed)
Kristin Mack  is still doing well. We'll continue with her current medications. Her left ventricular systolic function is basically normal now.  I've encouraged her to continue with a good diet and exercise program. We'll see her again in 6 months. We'll draw fasting labs at that time.

## 2013-05-31 ENCOUNTER — Other Ambulatory Visit: Payer: Self-pay

## 2013-05-31 MED ORDER — FUROSEMIDE 40 MG PO TABS: 40.0000 mg | ORAL_TABLET | Freq: Every day | ORAL | Status: DC

## 2013-08-07 DIAGNOSIS — L821 Other seborrheic keratosis: Secondary | ICD-10-CM | POA: Diagnosis not present

## 2013-08-07 DIAGNOSIS — L280 Lichen simplex chronicus: Secondary | ICD-10-CM | POA: Diagnosis not present

## 2013-08-07 DIAGNOSIS — D1801 Hemangioma of skin and subcutaneous tissue: Secondary | ICD-10-CM | POA: Diagnosis not present

## 2013-08-07 DIAGNOSIS — L719 Rosacea, unspecified: Secondary | ICD-10-CM | POA: Diagnosis not present

## 2013-08-07 DIAGNOSIS — D239 Other benign neoplasm of skin, unspecified: Secondary | ICD-10-CM | POA: Diagnosis not present

## 2013-08-11 DIAGNOSIS — L97509 Non-pressure chronic ulcer of other part of unspecified foot with unspecified severity: Secondary | ICD-10-CM | POA: Diagnosis not present

## 2013-08-16 DIAGNOSIS — F339 Major depressive disorder, recurrent, unspecified: Secondary | ICD-10-CM | POA: Diagnosis not present

## 2013-08-28 DIAGNOSIS — L97509 Non-pressure chronic ulcer of other part of unspecified foot with unspecified severity: Secondary | ICD-10-CM | POA: Diagnosis not present

## 2013-08-29 ENCOUNTER — Other Ambulatory Visit: Payer: Self-pay | Admitting: *Deleted

## 2013-08-29 MED ORDER — LISINOPRIL 10 MG PO TABS: 10.0000 mg | ORAL_TABLET | Freq: Every day | ORAL | Status: DC

## 2013-08-30 DIAGNOSIS — F339 Major depressive disorder, recurrent, unspecified: Secondary | ICD-10-CM | POA: Diagnosis not present

## 2013-09-13 DIAGNOSIS — F339 Major depressive disorder, recurrent, unspecified: Secondary | ICD-10-CM | POA: Diagnosis not present

## 2013-09-14 DIAGNOSIS — L97509 Non-pressure chronic ulcer of other part of unspecified foot with unspecified severity: Secondary | ICD-10-CM | POA: Diagnosis not present

## 2013-09-15 ENCOUNTER — Other Ambulatory Visit: Payer: Self-pay

## 2013-09-15 MED ORDER — CARVEDILOL 12.5 MG PO TABS: 12.5000 mg | ORAL_TABLET | Freq: Two times a day (BID) | ORAL | Status: DC

## 2013-09-26 ENCOUNTER — Other Ambulatory Visit: Payer: Self-pay

## 2013-09-26 DIAGNOSIS — Z1231 Encounter for screening mammogram for malignant neoplasm of breast: Secondary | ICD-10-CM

## 2013-09-29 DIAGNOSIS — L97509 Non-pressure chronic ulcer of other part of unspecified foot with unspecified severity: Secondary | ICD-10-CM | POA: Diagnosis not present

## 2013-10-04 ENCOUNTER — Ambulatory Visit: Payer: Medicare Other

## 2013-10-10 DIAGNOSIS — F339 Major depressive disorder, recurrent, unspecified: Secondary | ICD-10-CM | POA: Diagnosis not present

## 2013-10-17 ENCOUNTER — Encounter (INDEPENDENT_AMBULATORY_CARE_PROVIDER_SITE_OTHER): Payer: Self-pay

## 2013-10-17 ENCOUNTER — Ambulatory Visit
Admission: RE | Admit: 2013-10-17 | Discharge: 2013-10-17 | Disposition: A | Discharge status: Home / Self Care | Payer: Medicare Other | Source: Ambulatory Visit

## 2013-10-17 DIAGNOSIS — Z1231 Encounter for screening mammogram for malignant neoplasm of breast: Secondary | ICD-10-CM | POA: Diagnosis not present

## 2013-10-18 ENCOUNTER — Other Ambulatory Visit: Payer: Self-pay

## 2013-10-18 MED ORDER — FUROSEMIDE 40 MG PO TABS: 40.0000 mg | ORAL_TABLET | Freq: Every day | ORAL | Status: DC

## 2013-10-18 MED ORDER — NITROGLYCERIN 0.4 MG SL SUBL: 0.4000 mg | SUBLINGUAL_TABLET | SUBLINGUAL | Status: DC | PRN

## 2013-10-18 MED ORDER — ROSUVASTATIN CALCIUM 20 MG PO TABS: 20.0000 mg | ORAL_TABLET | Freq: Every day | ORAL | Status: DC

## 2013-10-18 MED ORDER — LISINOPRIL 10 MG PO TABS: 10.0000 mg | ORAL_TABLET | Freq: Every day | ORAL | Status: DC

## 2013-10-20 ENCOUNTER — Other Ambulatory Visit: Payer: Self-pay | Admitting: Obstetrics and Gynecology

## 2013-10-20 DIAGNOSIS — R928 Other abnormal and inconclusive findings on diagnostic imaging of breast: Secondary | ICD-10-CM

## 2013-10-25 DIAGNOSIS — F339 Major depressive disorder, recurrent, unspecified: Secondary | ICD-10-CM | POA: Diagnosis not present

## 2013-11-07 ENCOUNTER — Other Ambulatory Visit (INDEPENDENT_AMBULATORY_CARE_PROVIDER_SITE_OTHER): Payer: Medicare Other

## 2013-11-07 DIAGNOSIS — E785 Hyperlipidemia, unspecified: Secondary | ICD-10-CM | POA: Diagnosis not present

## 2013-11-07 DIAGNOSIS — I509 Heart failure, unspecified: Secondary | ICD-10-CM

## 2013-11-07 DIAGNOSIS — I5022 Chronic systolic (congestive) heart failure: Secondary | ICD-10-CM

## 2013-11-07 DIAGNOSIS — H33009 Unspecified retinal detachment with retinal break, unspecified eye: Secondary | ICD-10-CM | POA: Diagnosis not present

## 2013-11-07 LAB — BASIC METABOLIC PANEL
BUN: 23 mg/dL (ref 6–23)
CO2: 29 mEq/L (ref 19–32)
Calcium: 9.3 mg/dL (ref 8.4–10.5)
Chloride: 103 mEq/L (ref 96–112)
Creatinine, Ser: 0.8 mg/dL (ref 0.4–1.2)
GFR: 77.46 mL/min (ref >60.00)
Glucose, Bld: 90 mg/dL (ref 70–99)
Potassium: 4.5 mEq/L (ref 3.5–5.1)
Sodium: 138 mEq/L (ref 135–145)

## 2013-11-07 LAB — HEPATIC FUNCTION PANEL
ALT: 22 U/L (ref 0–35)
AST: 28 U/L (ref 0–37)
Albumin: 4.1 g/dL (ref 3.5–5.2)
Alkaline Phosphatase: 66 U/L (ref 39–117)
Bilirubin, Direct: 0.1 mg/dL (ref 0.0–0.3)
Total Bilirubin: 0.8 mg/dL (ref 0.2–1.2)
Total Protein: 7.1 g/dL (ref 6.0–8.3)

## 2013-11-07 LAB — LIPID PANEL
Cholesterol: 139 mg/dL (ref 0–200)
HDL: 50 mg/dL (ref >39.00)
LDL Cholesterol: 69 mg/dL (ref 0–99)
Total CHOL/HDL Ratio: 3
Triglycerides: 101 mg/dL (ref 0.0–149.0)
VLDL: 20.2 mg/dL (ref 0.0–40.0)

## 2013-11-08 ENCOUNTER — Ambulatory Visit
Admission: RE | Admit: 2013-11-08 | Discharge: 2013-11-08 | Disposition: A | Discharge status: Home / Self Care | Payer: Medicare Other | Source: Ambulatory Visit | Attending: Obstetrics and Gynecology | Admitting: Obstetrics and Gynecology

## 2013-11-08 ENCOUNTER — Other Ambulatory Visit: Payer: Self-pay | Admitting: Obstetrics and Gynecology

## 2013-11-08 ENCOUNTER — Ambulatory Visit
Admission: RE | Admit: 2013-11-08 | Discharge: 2013-11-08 | Disposition: A | Discharge status: Home / Self Care | Payer: BC Managed Care – PPO | Source: Ambulatory Visit | Attending: Obstetrics and Gynecology | Admitting: Obstetrics and Gynecology

## 2013-11-08 DIAGNOSIS — F339 Major depressive disorder, recurrent, unspecified: Secondary | ICD-10-CM | POA: Diagnosis not present

## 2013-11-08 DIAGNOSIS — R928 Other abnormal and inconclusive findings on diagnostic imaging of breast: Secondary | ICD-10-CM

## 2013-11-08 DIAGNOSIS — N63 Unspecified lump in unspecified breast: Secondary | ICD-10-CM | POA: Diagnosis not present

## 2013-11-08 DIAGNOSIS — N6310 Unspecified lump in the right breast, unspecified quadrant: Secondary | ICD-10-CM

## 2013-11-09 ENCOUNTER — Other Ambulatory Visit: Payer: Medicare Other

## 2013-11-14 ENCOUNTER — Ambulatory Visit: Payer: Medicare Other | Admitting: Cardiovascular Disease

## 2013-11-15 ENCOUNTER — Encounter: Payer: Self-pay | Admitting: Cardiovascular Disease

## 2013-11-15 ENCOUNTER — Ambulatory Visit (INDEPENDENT_AMBULATORY_CARE_PROVIDER_SITE_OTHER): Payer: Medicare Other | Admitting: Cardiovascular Disease

## 2013-11-15 VITALS — BP 118/74 | HR 71 | Ht 66.0 in | Wt 211.2 lb

## 2013-11-15 DIAGNOSIS — I509 Heart failure, unspecified: Secondary | ICD-10-CM | POA: Diagnosis not present

## 2013-11-15 DIAGNOSIS — E785 Hyperlipidemia, unspecified: Secondary | ICD-10-CM | POA: Diagnosis not present

## 2013-11-15 NOTE — Patient Instructions (Addendum)
Your physician wants you to follow-up in: 6 months with fasting lab work. You will receive a reminder letter in the mail two months in advance. If you don't receive a letter, please call our office to schedule the follow-up appointment.

## 2013-11-15 NOTE — Assessment & Plan Note (Signed)
Kristin Mack  is doing very well. Her ejection fraction has now normalized. We'll continue with her same medications. I'll see her again in 6 months. I've encouraged her to continue with a good diet and exercise program.

## 2013-11-15 NOTE — Progress Notes (Signed)
Kristin Mack Date of Birth  1940/11/11 [facility]  [address]    [place]   [place], [place]  [address]    [place], [place]  [address] [phone]  Fax  [phone]  [phone]  Fax [phone]  Problem List 1. CHF- LV EF has improved dramatically 2. Hyperlipidemia 3. Left hip replacement  History of Present Illness:  73 year old female with history of congestive heart failure. Her original ejection fraction was 10-20%. Her ejection fraction has dramatically improved and is now 57% ( Echo 2009). She had an echocardiogram in February 2011 that revealed normal left ventricle systolic function.  She's had 2 heart catheterizations. Her last cardiac cath was in 2006 which revealed moderate irregularities.  She's not having any episodes of chest pain or shortness breath. She works out a regular basis at least 2-3 times a week.  She has gained 20 lbs over the past several months.  She has a foot ulcer in her left foot and has not been in the pool.  She's not had any angina-like chest pain. She did have some stress-related chest discomfort around Christmastime but that has since resolved.  October 18, 2012:  Kristin Mack has done well for the past 6 months.  She has not had any dyspnea.  She has had lots of left hip pain and has not been able to exercise as much.   She is swimming 4 times a week.  Nov. 24, 2014:  Kristin Mack is doing well.  She has been having some acid reflux ( dry cough).   She has been trying eating smaller portions, earing earlier in the evening, avoiding spicy foods.   May 27,2015:  Kristin Mack is doing well.  No  CP or dyspnea.   Stressed out over family issues.   Her sons were living with her for several months.  She has gone up on her Effexor.  She has had some left thigh problems.     Current Outpatient Prescriptions on File Prior to Visit  Medication Sig Dispense Refill  . aspirin 81 MG tablet Take 81 mg  by mouth daily.       . carvedilol (COREG) 12.5 MG tablet Take 1 tablet (12.5 mg total) by mouth 2 (two) times daily with a meal.  60 tablet  3  . furosemide (LASIX) 40 MG tablet Take 1 tablet (40 mg total) by mouth daily.  30 tablet  1  . lisinopril (PRINIVIL,ZESTRIL) 10 MG tablet Take 1 tablet (10 mg total) by mouth daily.  30 tablet  1  . LORazepam (ATIVAN) 0.5 MG tablet Take 0.5 mg by mouth daily as needed. For anxiety      . Multiple Vitamin (MULTI-VITAMIN PO) Take 1 tablet by mouth daily.       . naproxen (NAPROSYN) 500 MG tablet Take 250 mg by mouth daily as needed. For pain      . nitroGLYCERIN (NITROSTAT) 0.4 MG SL tablet Place 1 tablet (0.4 mg total) under the tongue every 5 (five) minutes as needed for chest pain.  25 tablet  1  . Omega-3 Fatty Acids (FISH OIL) 1200 MG CAPS Take 1 capsule by mouth daily.      . pantoprazole (PROTONIX) 40 MG tablet Take 40 mg by mouth daily.       . rosuvastatin (CRESTOR) 20 MG tablet Take 1 tablet (20 mg total) by mouth daily.  30 tablet  1  . Vitamin  D, Ergocalciferol, (DRISDOL) 50000 UNITS CAPS Take by mouth every 30 (thirty) days.       No current facility-administered medications on file prior to visit.    No Known Allergies  Past Medical History  Diagnosis Date  . Coronary artery disease   . CHF (congestive heart failure)     Dilated cardiomyopathy. Her original ejection fraction was 10-20%. Her ejection fraction has improved to 57% by 2009  . Hypertension   . Dyslipidemia   . Chest pain   . Myocardial infarction   . Depression   . Obesity   . Headache(784.0)   . Carpal tunnel syndrome     Left Hand  . GERD (gastroesophageal reflux disease)   . Lower back pain   . Hemorrhoids   . History of anxiety   . Osteoarthritis     Severe with collapse of the left hip  . Foot drop, left     Past Surgical History  Procedure Laterality Date  . Tubal ligation    . Cardiac catheterization      Ejection Fraction was around 45-50%  .  Replacement total hip w/  resurfacing implants  2002    left  . Hysteroscopy  09/03/2011    Procedure: HYSTEROSCOPY;  Surgeon: Logan Bores, MD;  Location: [facility];  Service: Gynecology;  Laterality: N/A;  With Resection of polyp with truclear system    History  Smoking status  . Never Smoker   Smokeless tobacco  . Not on file    History  Alcohol Use No    Family History  Problem Relation Age of Onset  . Stroke Mother   . Cancer Sister   . Stroke Maternal Grandfather     Reviw of Systems:  Reviewed in the HPI.  All other systems are negative.  Physical Exam: BP 118/74  Pulse 71  Ht 5\' 6"  (1.676 m)  Wt 211 lb 3.2 oz (95.8 kg)  BMI 34.10 kg/m2 The patient is alert and oriented x 3.  The mood and affect are normal.   Skin: warm and dry.  Color is normal.    HEENT:   Normocephalic/atraumatic. She is no JVD. She has normal carotid arteries. Her neck is supple.  Lungs: Lungs are clear  Heart: Regular rate S1-S2   Abdomen: She is mildly obese. No hepatosplenomegaly  Extremities:  No clubbing cyanosis or edema.  Neuro:  Neurologic exam is nonfocal.    ECG: Nov 15, 2013:  NSR , NS St abnormality.   Assessment / Plan:

## 2013-11-15 NOTE — Assessment & Plan Note (Signed)
Will check fasting labs at her next office visit.

## 2013-11-22 DIAGNOSIS — F339 Major depressive disorder, recurrent, unspecified: Secondary | ICD-10-CM | POA: Diagnosis not present

## 2013-11-22 DIAGNOSIS — M2010 Hallux valgus (acquired), unspecified foot: Secondary | ICD-10-CM | POA: Diagnosis not present

## 2013-11-22 DIAGNOSIS — L97509 Non-pressure chronic ulcer of other part of unspecified foot with unspecified severity: Secondary | ICD-10-CM | POA: Diagnosis not present

## 2013-12-06 DIAGNOSIS — F339 Major depressive disorder, recurrent, unspecified: Secondary | ICD-10-CM | POA: Diagnosis not present

## 2013-12-12 DIAGNOSIS — F333 Major depressive disorder, recurrent, severe with psychotic symptoms: Secondary | ICD-10-CM | POA: Diagnosis not present

## 2013-12-20 DIAGNOSIS — F331 Major depressive disorder, recurrent, moderate: Secondary | ICD-10-CM | POA: Diagnosis not present

## 2013-12-21 ENCOUNTER — Other Ambulatory Visit: Payer: Self-pay

## 2013-12-21 MED ORDER — ROSUVASTATIN CALCIUM 20 MG PO TABS: 20.0000 mg | ORAL_TABLET | Freq: Every day | ORAL | Status: DC

## 2013-12-21 MED ORDER — LISINOPRIL 10 MG PO TABS: 10.0000 mg | ORAL_TABLET | Freq: Every day | ORAL | Status: DC

## 2013-12-21 MED ORDER — FUROSEMIDE 40 MG PO TABS: 40.0000 mg | ORAL_TABLET | Freq: Every day | ORAL | Status: DC

## 2014-01-03 DIAGNOSIS — F339 Major depressive disorder, recurrent, unspecified: Secondary | ICD-10-CM | POA: Diagnosis not present

## 2014-01-10 ENCOUNTER — Telehealth: Payer: Self-pay | Admitting: Cardiovascular Disease

## 2014-01-10 NOTE — Telephone Encounter (Signed)
Left message for patient to call me back. 

## 2014-01-10 NOTE — Telephone Encounter (Signed)
New message    Patient calling back to speak with nurse  C/O  Having muscle pain for the couple of months. crestor

## 2014-01-11 NOTE — Telephone Encounter (Signed)
Patient would like a call back regarding crestor medication. Please call and advise.

## 2014-01-11 NOTE — Telephone Encounter (Signed)
Pt has held Crestor for 3 weeks now as advised due to thigh pain.  She is now pain free.  She would like to how to proceed.  Advised to remain off Crestor and will forward information to Dr Acie Fredrickson for review.  She is aware she will be contacted with further orders after he reviews.

## 2014-01-14 NOTE — Telephone Encounter (Signed)
Lets try Atorvastatin 20 mg a day .  Recheck labs in 3 months. She should call if that causes muscle pain

## 2014-01-16 MED ORDER — ATORVASTATIN CALCIUM 20 MG PO TABS
20.0000 mg | ORAL_TABLET | Freq: Every day | ORAL | Status: DC
Start: 2014-01-16 — End: 2014-10-30

## 2014-01-16 NOTE — Telephone Encounter (Signed)
Left message for patient to call back  

## 2014-01-16 NOTE — Telephone Encounter (Signed)
Spoke with patient and advised that Dr. Acie Fredrickson recommends Atorvastatin 20 mg once daily.  I scheduled patient for repeat fasting lab work on 10/29.  Patient verbalized understanding and agreement and Rx sent to patient's pharmacy per request.

## 2014-01-31 DIAGNOSIS — F339 Major depressive disorder, recurrent, unspecified: Secondary | ICD-10-CM | POA: Diagnosis not present

## 2014-02-05 ENCOUNTER — Other Ambulatory Visit: Payer: Self-pay

## 2014-02-05 MED ORDER — CARVEDILOL 12.5 MG PO TABS: 12.5000 mg | ORAL_TABLET | Freq: Two times a day (BID) | ORAL | Status: DC

## 2014-02-06 DIAGNOSIS — L819 Disorder of pigmentation, unspecified: Secondary | ICD-10-CM | POA: Diagnosis not present

## 2014-02-06 DIAGNOSIS — D1801 Hemangioma of skin and subcutaneous tissue: Secondary | ICD-10-CM | POA: Diagnosis not present

## 2014-02-06 DIAGNOSIS — L570 Actinic keratosis: Secondary | ICD-10-CM | POA: Diagnosis not present

## 2014-02-14 ENCOUNTER — Encounter: Payer: Self-pay | Admitting: Cardiovascular Disease

## 2014-02-14 DIAGNOSIS — F339 Major depressive disorder, recurrent, unspecified: Secondary | ICD-10-CM | POA: Diagnosis not present

## 2014-02-14 NOTE — Telephone Encounter (Signed)
This encounter was created in error - please disregard.

## 2014-02-14 NOTE — Telephone Encounter (Signed)
New problem   Pt want to know if she need to see Dr Acie Fredrickson 1 month after starting Lipitor. Please advise pt

## 2014-02-23 DIAGNOSIS — Z6833 Body mass index (BMI) 33.0-33.9, adult: Secondary | ICD-10-CM | POA: Diagnosis not present

## 2014-02-23 DIAGNOSIS — L97409 Non-pressure chronic ulcer of unspecified heel and midfoot with unspecified severity: Secondary | ICD-10-CM | POA: Diagnosis not present

## 2014-02-23 DIAGNOSIS — I10 Essential (primary) hypertension: Secondary | ICD-10-CM | POA: Diagnosis not present

## 2014-02-28 DIAGNOSIS — F339 Major depressive disorder, recurrent, unspecified: Secondary | ICD-10-CM | POA: Diagnosis not present

## 2014-03-02 ENCOUNTER — Other Ambulatory Visit: Payer: Self-pay | Admitting: *Deleted

## 2014-03-02 DIAGNOSIS — Z0000 Encounter for general adult medical examination without abnormal findings: Secondary | ICD-10-CM | POA: Diagnosis not present

## 2014-03-02 DIAGNOSIS — N952 Postmenopausal atrophic vaginitis: Secondary | ICD-10-CM | POA: Diagnosis not present

## 2014-03-02 DIAGNOSIS — Z124 Encounter for screening for malignant neoplasm of cervix: Secondary | ICD-10-CM | POA: Diagnosis not present

## 2014-03-02 DIAGNOSIS — Z01419 Encounter for gynecological examination (general) (routine) without abnormal findings: Secondary | ICD-10-CM | POA: Diagnosis not present

## 2014-03-02 MED ORDER — NITROGLYCERIN 0.4 MG SL SUBL: 0.4000 mg | SUBLINGUAL_TABLET | SUBLINGUAL | Status: DC | PRN

## 2014-03-07 ENCOUNTER — Encounter: Payer: Self-pay | Admitting: Nurse Practitioner

## 2014-03-14 DIAGNOSIS — F339 Major depressive disorder, recurrent, unspecified: Secondary | ICD-10-CM | POA: Diagnosis not present

## 2014-03-19 ENCOUNTER — Encounter (HOSPITAL_BASED_OUTPATIENT_CLINIC_OR_DEPARTMENT_OTHER): Payer: Medicare Other | Attending: Plastic Surgery

## 2014-03-19 DIAGNOSIS — S91109A Unspecified open wound of unspecified toe(s) without damage to nail, initial encounter: Secondary | ICD-10-CM | POA: Diagnosis not present

## 2014-03-19 DIAGNOSIS — F3289 Other specified depressive episodes: Secondary | ICD-10-CM | POA: Insufficient documentation

## 2014-03-19 DIAGNOSIS — M216X9 Other acquired deformities of unspecified foot: Secondary | ICD-10-CM | POA: Insufficient documentation

## 2014-03-19 DIAGNOSIS — L97509 Non-pressure chronic ulcer of other part of unspecified foot with unspecified severity: Secondary | ICD-10-CM | POA: Insufficient documentation

## 2014-03-19 DIAGNOSIS — Z7982 Long term (current) use of aspirin: Secondary | ICD-10-CM | POA: Insufficient documentation

## 2014-03-19 DIAGNOSIS — Z79899 Other long term (current) drug therapy: Secondary | ICD-10-CM | POA: Diagnosis not present

## 2014-03-19 DIAGNOSIS — I2510 Atherosclerotic heart disease of native coronary artery without angina pectoris: Secondary | ICD-10-CM | POA: Diagnosis not present

## 2014-03-19 DIAGNOSIS — I10 Essential (primary) hypertension: Secondary | ICD-10-CM | POA: Diagnosis not present

## 2014-03-19 DIAGNOSIS — E785 Hyperlipidemia, unspecified: Secondary | ICD-10-CM | POA: Insufficient documentation

## 2014-03-19 DIAGNOSIS — F411 Generalized anxiety disorder: Secondary | ICD-10-CM | POA: Diagnosis not present

## 2014-03-19 DIAGNOSIS — E1169 Type 2 diabetes mellitus with other specified complication: Secondary | ICD-10-CM | POA: Insufficient documentation

## 2014-03-19 DIAGNOSIS — F329 Major depressive disorder, single episode, unspecified: Secondary | ICD-10-CM | POA: Diagnosis not present

## 2014-03-20 NOTE — Consult Note (Signed)
NAMECORBY, VILLASENOR NO.:  [id number]  MEDICAL RECORD NO.:  [id number]  LOCATION:  FOOT                         FACILITY:  [facility]  PHYSICIAN:  Irene Limbo, MD   DATE OF BIRTH:  [birth year]  DATE OF CONSULTATION:  03/19/2014 DATE OF DISCHARGE:                                CONSULTATION   CHIEF COMPLAINT:  Left hallux ulcer.  HISTORY OF PRESENT ILLNESS:  The patient is a 73 year old ambulatory female is referred by her primary care physician for evaluation of a left great toe ulcer that has been present for at least 2-1/2 years. She states that it has been worse in the last 2-3 weeks.  She has a history of foot drop following hip replacement and uses orthotic brace. She reports that this places a large amount of pressure in the area of the wounding.  She is conscientious about trimming her calluses.  She reports she used a clippers to remove the callus and suffered wounding from that.  It has not gone on to heal during this period and she feels that it has been worse in the last few weeks.  She has not had any particular wound care during this time.  She has held off on using her orthotics more recently.  She does report that she has been continuing to use the pool for exercise and has been doing this without restriction during the period of her open wound.  PAST MEDICAL HISTORY:  Includes history of cardiomyopathy with congestive heart failure.  Her last echo, however, was reportedly normal from review of the chart, EF of 57% in 2009. History of foot drop following left hip replacement, hypertension, hyperlipidemia, history of myocardial infarction, depression, gastroesophageal reflux.  PAST SURGICAL HISTORY:  Includes left total hip replacement in 2002, tubal ligation, retinal tear surgery in 2013.  SOCIAL HISTORY:  The patient denies any history of tobacco use.  MEDICATIONS:  Include Norco, Vyvanse, vitamin D, methocarbamol, multivitamin, Flonase,  Ativan, Coreg, Crestor, Effexor, Lasix, lisinopril, Protonix, aspirin, fish oil.  There are no recent laboratory for review.  PHYSICAL EXAMINATION:  On examination, blood pressure is 112/66, pulse is 80, temperature is 98.3.  Height is 5 feet 7 inches, weight is 212 pounds.  She is absent to sensation over the dorsum of her left foot as well as in the area of wounding over her medial hallux.  She has positive sensation by Semmes-Weinstein testing over all plantar foot points tested.  ABI is calculated as 1.07.  Over the left great toe, she has open wound measured at 0.6 x 0.2 x 0.1 cm.  Left calf circumference is 36.5 cm.  Left ankle is 23.8 cm.  The patient is largely anesthetic in the area.  However, a topical anesthetic was applied.  Scissors were then used to remove the hyperkeratotic tissue surrounding the wound as well as to perform selective debridement of the wound base.  We will institute dressing changes with collagen to be performed 3 times weekly. I counseled her to refrain from any soaking in pools or hot tubs as this is an infectious source.  We also discussed baseline laboratories and I also discussed referral to Vascular Surgery for ABI and toe  brachial index, especially if she has had a chronic wound for over 2 years time that has not healed.  The patient, however, states that she would not want any further workup or intervention if this was abnormal and declines referral for this at this time.  We will plan for a followup in 1 week.          ______________________________ Irene Limbo, MD MBA     BT/MEDQ  D:  03/19/2014  T:  03/20/2014  Job:  [id number]

## 2014-03-21 ENCOUNTER — Telehealth: Payer: Self-pay | Admitting: Cardiovascular Disease

## 2014-03-21 NOTE — Telephone Encounter (Signed)
New problem:   Pt would like a call back about her medications and  3 mo follow up in her Cholesterol

## 2014-03-21 NOTE — Telephone Encounter (Signed)
Left message for patient to call back  

## 2014-03-22 NOTE — Telephone Encounter (Signed)
Left message for patient to call back  

## 2014-03-22 NOTE — Telephone Encounter (Signed)
Left message for patient to call me back. 

## 2014-03-23 ENCOUNTER — Encounter: Payer: Self-pay | Admitting: Podiatry

## 2014-03-23 ENCOUNTER — Ambulatory Visit (INDEPENDENT_AMBULATORY_CARE_PROVIDER_SITE_OTHER): Payer: Medicare Other | Admitting: Podiatry

## 2014-03-23 ENCOUNTER — Ambulatory Visit: Payer: Medicare Other | Admitting: Podiatry

## 2014-03-23 VITALS — BP 106/57 | HR 73 | Resp 14 | Ht 67.0 in | Wt 212.0 lb

## 2014-03-23 DIAGNOSIS — L84 Corns and callosities: Secondary | ICD-10-CM | POA: Diagnosis not present

## 2014-03-23 DIAGNOSIS — L603 Nail dystrophy: Secondary | ICD-10-CM | POA: Diagnosis not present

## 2014-03-23 DIAGNOSIS — M2040 Other hammer toe(s) (acquired), unspecified foot: Secondary | ICD-10-CM

## 2014-03-23 DIAGNOSIS — M21372 Foot drop, left foot: Secondary | ICD-10-CM

## 2014-03-23 DIAGNOSIS — B351 Tinea unguium: Secondary | ICD-10-CM | POA: Diagnosis not present

## 2014-03-23 DIAGNOSIS — M2010 Hallux valgus (acquired), unspecified foot: Secondary | ICD-10-CM

## 2014-03-23 NOTE — Progress Notes (Signed)
   Subjective:    Patient ID: Kristin Mack, female    DOB: 1941/02/09, 73 y.o.   MRN: [id number]  HPI Comments: Kristin Mack, 73 year old female, presents the office they for multiple complaints. Patient is asking for a "medical pedicure", discussed toenail fungus, discussed shoe gear to accommodate her foot type, discussed bunions and hammertoes.   She states that she's had nail fungus for many years and she states that she's had prior treatment with a dermatologist. Previously she states that she is tried both oral as well as topical medication which did not relieve her symptoms. She had no other treatments. Denies any pain, redness, drainage from around the nails.  She has a history of drop foot on the left lower extremity for which he wears a brace. She is asking discussed shoe gear back to accommodate the brace.  She also has had ongoing hammertoes and bunions bilaterally. She says they have been progressive over many years and to discuss "natural" ways of correcting the bunion in hopes of avoiding surgery.   He also is currently being treated the wound care center due to a wound on the left big toe. She states that she has a callus in the area for which she was cutting herself and she ended up with a wound. Denies any systemic complaints as fevers, chills, nausea, vomiting.  No other complaints at this time.     Review of Systems  All other systems reviewed and are negative.      Objective:   Physical Exam AAO x3, NAD DP/PT pulses palpable b/l. CRT < 3 sec Protective sensation intact with Simms Weinstein monofilament, vibratory sensation intact, Achilles tendon reflex intact. Significant HAV deformity bilaterally without prominence of the medial eminence of the first metatarsal head. Rigid hammertoe contractures of lesser digits. Dropfoot left lower extremity. Decrease in medial arch weightbearing bilaterally. Mild equinus. ROM WNL Hyperkeratotic lesion medial aspect of  the right hallux of the IPJ. And along the distal aspect of the digits on the right foot due to underlying hammertoe deformity. Superficial ulceration on the medial aspect of the left hallux of the IPJ without any clinical signs of infection at this time.  Nails hypertrophic, dystrophic, elongated, yellow discoloration with a right > left.  No calf pain, swelling, warmth.       Assessment & Plan:  73 year old female with likely onychomycosis, significant HAV/rigid hammertoe deformities, dropfoot left lower extremity.  -Conservative versus surgical treatment were discussed including alternatives, risks, complications. -Discussed. Treatment options for bunion and hammertoe deformities as well as the underlying etiology. Discussed with the patient that without surgery to be difficult to correct bunion and hammertoe deformities however he can alleviate pressure off the prominent areas with offloading pads and orthotics. She was dispensed offloading pads today. Discussed appropriate shoe gear help take pressure off of the areas. Discussed having a wider toe box as well to tolerate toe box. -Due to the prior history of both oral and topical treatment of nail fungus, we will biopsy the nails before proceeding with any further treatment of nail fungus. Biopsies were obtained and sent to [facility] labs for evaluation. We'll call the patient with the results once they returned.  -Nails sharply debrided X41 without complications. -Hyperkeratotic lesions medial right hallux as well the distal digit sharply debrided without complications.

## 2014-03-23 NOTE — Patient Instructions (Signed)

## 2014-03-26 ENCOUNTER — Encounter (HOSPITAL_BASED_OUTPATIENT_CLINIC_OR_DEPARTMENT_OTHER): Payer: Medicare Other | Attending: Plastic Surgery

## 2014-03-26 DIAGNOSIS — L97429 Non-pressure chronic ulcer of left heel and midfoot with unspecified severity: Secondary | ICD-10-CM | POA: Insufficient documentation

## 2014-03-27 NOTE — Telephone Encounter (Signed)
Closing encounter due to no returned call

## 2014-03-27 NOTE — Progress Notes (Signed)
[facility]  NAME:  Kristin Mack, Kristin Mack NO.:  [id number]  MEDICAL RECORD NO.:  [id number]      DATE OF BIRTH:  21-Jan-1941  PHYSICIAN:  Irene Limbo, MD    VISIT DATE:  03/26/2014                                  OFFICE VISIT   CHIEF COMPLAINT:  Pressure ulceration of left hallux over distal phalanx in the setting of acquired footdrop.  HISTORY OF PRESENT ILLNESS:  The patient returns today after instituting collagen dressing changes twice weekly to her left great toe.  Wound has been present for over a year's time.  She states that since her last visit, she has had her toenails trimmed at the [facility].  She did discuss offloading footwear with them and they recommended she go to the [facility] on [address] for [employer]  She currently is using her foot drop brace.  On examination, she has surrounding callus which was removed sharply with scissors.  Following removal of this, she does have epithelialization present over majority of the base of the wound.  It is contracted and measured at 0.5 x 0.1 x 0.1 cm.  No debridement was performed today.  Instructed the patient to continue with collagen dressing changes twice weekly.  On the day of her dressing changes, she may bathe the area with regular soap and water and pat dry. We discussed that she has had used contact casting in the past. However, given her peripheral foot drop, I am hesitant to use this as it may contribute to her frequent falls which she has experienced.  We will plan to continue with her current foot wear and encouraged her to examine her feet daily and moisturize, and plan for followup in 1 week's time.          ______________________________ Irene Limbo, MD MBA     BT/MEDQ  D:  03/26/2014  T:  03/27/2014  Job:  [id number]

## 2014-03-28 ENCOUNTER — Other Ambulatory Visit: Payer: Self-pay | Admitting: Podiatry

## 2014-03-28 ENCOUNTER — Telehealth: Payer: Self-pay | Admitting: *Deleted

## 2014-03-28 DIAGNOSIS — F339 Major depressive disorder, recurrent, unspecified: Secondary | ICD-10-CM | POA: Diagnosis not present

## 2014-03-28 DIAGNOSIS — B351 Tinea unguium: Secondary | ICD-10-CM

## 2014-03-28 NOTE — Telephone Encounter (Signed)
Mycotic nails sent to [facility] for fungal culture with smear to rule out Onychomycosis.

## 2014-04-02 DIAGNOSIS — L97429 Non-pressure chronic ulcer of left heel and midfoot with unspecified severity: Secondary | ICD-10-CM | POA: Diagnosis not present

## 2014-04-05 DIAGNOSIS — E785 Hyperlipidemia, unspecified: Secondary | ICD-10-CM | POA: Diagnosis not present

## 2014-04-05 DIAGNOSIS — Z23 Encounter for immunization: Secondary | ICD-10-CM | POA: Diagnosis not present

## 2014-04-05 DIAGNOSIS — I10 Essential (primary) hypertension: Secondary | ICD-10-CM | POA: Diagnosis not present

## 2014-04-05 DIAGNOSIS — E559 Vitamin D deficiency, unspecified: Secondary | ICD-10-CM | POA: Diagnosis not present

## 2014-04-05 DIAGNOSIS — Z0000 Encounter for general adult medical examination without abnormal findings: Secondary | ICD-10-CM | POA: Diagnosis not present

## 2014-04-05 DIAGNOSIS — E11621 Type 2 diabetes mellitus with foot ulcer: Secondary | ICD-10-CM | POA: Diagnosis not present

## 2014-04-05 DIAGNOSIS — L97409 Non-pressure chronic ulcer of unspecified heel and midfoot with unspecified severity: Secondary | ICD-10-CM | POA: Diagnosis not present

## 2014-04-05 DIAGNOSIS — Z6833 Body mass index (BMI) 33.0-33.9, adult: Secondary | ICD-10-CM | POA: Diagnosis not present

## 2014-04-09 DIAGNOSIS — L97429 Non-pressure chronic ulcer of left heel and midfoot with unspecified severity: Secondary | ICD-10-CM | POA: Diagnosis not present

## 2014-04-10 ENCOUNTER — Encounter: Payer: Self-pay | Admitting: Podiatry

## 2014-04-11 DIAGNOSIS — I2510 Atherosclerotic heart disease of native coronary artery without angina pectoris: Secondary | ICD-10-CM | POA: Diagnosis not present

## 2014-04-11 DIAGNOSIS — I10 Essential (primary) hypertension: Secondary | ICD-10-CM | POA: Diagnosis not present

## 2014-04-11 DIAGNOSIS — Z1389 Encounter for screening for other disorder: Secondary | ICD-10-CM | POA: Diagnosis not present

## 2014-04-11 DIAGNOSIS — E785 Hyperlipidemia, unspecified: Secondary | ICD-10-CM | POA: Diagnosis not present

## 2014-04-11 DIAGNOSIS — F339 Major depressive disorder, recurrent, unspecified: Secondary | ICD-10-CM | POA: Diagnosis not present

## 2014-04-11 DIAGNOSIS — F418 Other specified anxiety disorders: Secondary | ICD-10-CM | POA: Diagnosis not present

## 2014-04-11 DIAGNOSIS — E559 Vitamin D deficiency, unspecified: Secondary | ICD-10-CM | POA: Diagnosis not present

## 2014-04-11 DIAGNOSIS — K219 Gastro-esophageal reflux disease without esophagitis: Secondary | ICD-10-CM | POA: Diagnosis not present

## 2014-04-11 DIAGNOSIS — Z0000 Encounter for general adult medical examination without abnormal findings: Secondary | ICD-10-CM | POA: Diagnosis not present

## 2014-04-11 DIAGNOSIS — M21372 Foot drop, left foot: Secondary | ICD-10-CM | POA: Diagnosis not present

## 2014-04-19 ENCOUNTER — Other Ambulatory Visit: Payer: Medicare Other

## 2014-04-25 DIAGNOSIS — F339 Major depressive disorder, recurrent, unspecified: Secondary | ICD-10-CM | POA: Diagnosis not present

## 2014-05-09 DIAGNOSIS — F339 Major depressive disorder, recurrent, unspecified: Secondary | ICD-10-CM | POA: Diagnosis not present

## 2014-05-15 ENCOUNTER — Other Ambulatory Visit: Payer: Self-pay

## 2014-05-15 DIAGNOSIS — H33322 Round hole, left eye: Secondary | ICD-10-CM | POA: Diagnosis not present

## 2014-05-15 DIAGNOSIS — H43813 Vitreous degeneration, bilateral: Secondary | ICD-10-CM | POA: Diagnosis not present

## 2014-05-15 DIAGNOSIS — H33312 Horseshoe tear of retina without detachment, left eye: Secondary | ICD-10-CM | POA: Diagnosis not present

## 2014-05-15 DIAGNOSIS — H40013 Open angle with borderline findings, low risk, bilateral: Secondary | ICD-10-CM | POA: Diagnosis not present

## 2014-05-15 MED ORDER — CARVEDILOL 12.5 MG PO TABS: 12.5000 mg | ORAL_TABLET | Freq: Two times a day (BID) | ORAL | Status: DC

## 2014-05-22 ENCOUNTER — Ambulatory Visit (INDEPENDENT_AMBULATORY_CARE_PROVIDER_SITE_OTHER): Payer: Medicare Other | Admitting: Cardiovascular Disease

## 2014-05-22 ENCOUNTER — Encounter: Payer: Self-pay | Admitting: Cardiovascular Disease

## 2014-05-22 VITALS — BP 120/66 | HR 86 | Ht 66.0 in | Wt 220.4 lb

## 2014-05-22 DIAGNOSIS — I5022 Chronic systolic (congestive) heart failure: Secondary | ICD-10-CM

## 2014-05-22 DIAGNOSIS — E785 Hyperlipidemia, unspecified: Secondary | ICD-10-CM

## 2014-05-22 MED ORDER — CARVEDILOL 12.5 MG PO TABS
12.5000 mg | ORAL_TABLET | Freq: Two times a day (BID) | ORAL | Status: DC
Start: 2014-05-22 — End: 2014-05-24

## 2014-05-22 NOTE — Assessment & Plan Note (Signed)
Kristin Mack is doing well. She's not had any recent symptoms of heart failure. Her left ventricle systolic function has improved significantly from her original diagnosis.

## 2014-05-22 NOTE — Assessment & Plan Note (Signed)
Chest fasting labs at her next office visit.

## 2014-05-22 NOTE — Progress Notes (Signed)
Kristin Ina, PhD Date of Birth  Sep 21, 1940 [name]    [name]   [name], [name]  [address]    [name], [place]  [address] [phone]  Fax  [phone]  [phone]  Fax [phone]  Problem List 1. CHF- LV EF has improved dramatically 2. Hyperlipidemia 3. Left hip replacement  History of Present Illness:  73 year old female with history of congestive heart failure. Her original ejection fraction was 10-20%. Her ejection fraction has dramatically improved and is now 57% ( Echo 2009). She had an echocardiogram in February 2011 that revealed normal left ventricle systolic function.  She's had 2 heart catheterizations. Her last cardiac cath was in 2006 which revealed moderate irregularities.  She's not having any episodes of chest pain or shortness breath. She works out a regular basis at least 2-3 times a week.  She has gained 20 lbs over the past several months.  She has a foot ulcer in her left foot and has not been in the pool.  She's not had any angina-like chest pain. She did have some stress-related chest discomfort around Christmastime but that has since resolved.  October 18, 2012:  Kristin Mack has done well for the past 6 months.  She has not had any dyspnea.  She has had lots of left hip pain and has not been able to exercise as much.   She is swimming 4 times a week.  Nov. 24, 2014:  Kristin Mack is doing well.  She has been having some acid reflux ( dry cough).   She has been trying eating smaller portions, earing earlier in the evening, avoiding spicy foods.   May 27,2015:  Kristin Mack is doing well.  No  CP or dyspnea.   Stressed out over family issues.   Her sons were living with her for several months.  She has gone up on her Effexor.  She has had some left thigh problems.    Dec. 1, 2015:  Kristin Mack is seen today for follow up of her CHF.    She has done well.   She has not been swimming as much  recently - has an ulcer on the bottom of her left great toe.   Current Outpatient Prescriptions on File Prior to Visit  Medication Sig Dispense Refill  . aspirin 81 MG tablet Take 81 mg by mouth daily.     Marland Kitchen atorvastatin (LIPITOR) 20 MG tablet Take 1 tablet (20 mg total) by mouth daily at 6 PM. 30 tablet 11  . carvedilol (COREG) 12.5 MG tablet Take 1 tablet (12.5 mg total) by mouth 2 (two) times daily with a meal. 60 tablet 3  . furosemide (LASIX) 40 MG tablet Take 1 tablet (40 mg total) by mouth daily. 30 tablet 6  . lisinopril (PRINIVIL,ZESTRIL) 10 MG tablet Take 1 tablet (10 mg total) by mouth daily. 30 tablet 6  . LORazepam (ATIVAN) 0.5 MG tablet Take 0.5 mg by mouth daily as needed. For anxiety    . Multiple Vitamin (MULTI-VITAMIN PO) Take 1 tablet by mouth daily.     . naproxen (NAPROSYN) 500 MG tablet Take 250 mg by mouth daily as needed. For pain    . nitroGLYCERIN (NITROSTAT) 0.4 MG SL tablet Place 1 tablet (0.4 mg total) under the tongue every 5 (five) minutes as needed for chest pain. 25 tablet 1  . Omega-3 Fatty Acids (FISH OIL) 1200 MG CAPS Take  1 capsule by mouth daily.    . pantoprazole (PROTONIX) 40 MG tablet Take 40 mg by mouth daily.     . Vitamin D, Ergocalciferol, (DRISDOL) 50000 UNITS CAPS Take by mouth every 30 (thirty) days.     No current facility-administered medications on file prior to visit.    Allergies  Allergen Reactions  . Nickel Itching and Rash    Past Medical History  Diagnosis Date  . Coronary artery disease   . CHF (congestive heart failure)     Dilated cardiomyopathy. Her original ejection fraction was 10-20%. Her ejection fraction has improved to 57% by 2009  . Hypertension   . Dyslipidemia   . Chest pain   . Myocardial infarction   . Depression   . Obesity   . Headache(784.0)   . Carpal tunnel syndrome     Left Hand  . GERD (gastroesophageal reflux disease)   . Lower back pain   . Hemorrhoids   . History of anxiety   . Osteoarthritis      Severe with collapse of the left hip  . Foot drop, left     Past Surgical History  Procedure Laterality Date  . Tubal ligation    . Cardiac catheterization      Ejection Fraction was around 45-50%  . Replacement total hip w/  resurfacing implants  2002    left  . Hysteroscopy  09/03/2011    Procedure: HYSTEROSCOPY;  Surgeon: Logan Bores, MD;  Location: [facility];  Service: Gynecology;  Laterality: N/A;  With Resection of polyp with truclear system    History  Smoking status  . Never Smoker   Smokeless tobacco  . Not on file    History  Alcohol Use No    Family History  Problem Relation Age of Onset  . Stroke Mother   . Cancer Sister   . Stroke Maternal Grandfather     Reviw of Systems:  Reviewed in the HPI.  All other systems are negative.  Physical Exam: BP 120/66 mmHg  Pulse 86  Ht 5\' 6"  (1.676 m)  Wt 220 lb 6.4 oz (99.973 kg)  BMI 35.59 kg/m2  SpO2 97% The patient is alert and oriented x 3.  The mood and affect are normal.   Skin: warm and dry.  Color is normal.    HEENT:   Normocephalic/atraumatic. She is no JVD. She has normal carotid arteries. Her neck is supple.  Lungs: Lungs are clear  Heart: Regular rate S1-S2   Abdomen: She is mildly obese. No hepatosplenomegaly  Extremities:  No clubbing cyanosis or edema.  Neuro:  Neurologic exam is nonfocal.    ECG: Nov 15, 2013:  NSR , NS St abnormality.   Assessment / Plan:

## 2014-05-22 NOTE — Patient Instructions (Signed)
Your physician recommends that you continue on your current medications as directed. Please refer to the Current Medication list given to you today.  Your physician wants you to follow-up in: 6 months with Dr. Acie Fredrickson.  You will receive a reminder letter in the mail two months in advance. If you don't receive a letter, please call our office to schedule the follow-up appointment. Your physician recommends that you return for lab work (cholesterol, liver, bmet) in: 6 months on the day of or a few days before your office visit with Dr. Acie Fredrickson.  You will need to FAST for this appointment - nothing to eat or drink after midnight the night before except water.

## 2014-05-23 DIAGNOSIS — F339 Major depressive disorder, recurrent, unspecified: Secondary | ICD-10-CM | POA: Diagnosis not present

## 2014-05-24 ENCOUNTER — Other Ambulatory Visit: Payer: Self-pay | Admitting: *Deleted

## 2014-05-24 MED ORDER — CARVEDILOL 12.5 MG PO TABS: 12.5000 mg | ORAL_TABLET | Freq: Two times a day (BID) | ORAL | Status: DC

## 2014-06-06 DIAGNOSIS — F339 Major depressive disorder, recurrent, unspecified: Secondary | ICD-10-CM | POA: Diagnosis not present

## 2014-06-12 DIAGNOSIS — F329 Major depressive disorder, single episode, unspecified: Secondary | ICD-10-CM | POA: Diagnosis not present

## 2014-06-12 DIAGNOSIS — H33312 Horseshoe tear of retina without detachment, left eye: Secondary | ICD-10-CM | POA: Diagnosis not present

## 2014-06-12 DIAGNOSIS — H40013 Open angle with borderline findings, low risk, bilateral: Secondary | ICD-10-CM | POA: Diagnosis not present

## 2014-06-16 ENCOUNTER — Ambulatory Visit (INDEPENDENT_AMBULATORY_CARE_PROVIDER_SITE_OTHER): Payer: Medicare Other | Admitting: Family Medicine

## 2014-06-16 VITALS — BP 122/70 | HR 78 | Temp 98.6°F | Resp 16 | Ht 67.0 in | Wt 223.4 lb

## 2014-06-16 DIAGNOSIS — R05 Cough: Secondary | ICD-10-CM

## 2014-06-16 DIAGNOSIS — J0100 Acute maxillary sinusitis, unspecified: Secondary | ICD-10-CM | POA: Diagnosis not present

## 2014-06-16 DIAGNOSIS — R0982 Postnasal drip: Secondary | ICD-10-CM

## 2014-06-16 DIAGNOSIS — R059 Cough, unspecified: Secondary | ICD-10-CM

## 2014-06-16 MED ORDER — HYDROCOD POLST-CHLORPHEN POLST 10-8 MG/5ML PO LQCR: 5.0000 mL | Freq: Two times a day (BID) | ORAL | Status: DC | PRN

## 2014-06-16 MED ORDER — AMOXICILLIN-POT CLAVULANATE 875-125 MG PO TABS: 1.0000 | ORAL_TABLET | Freq: Two times a day (BID) | ORAL | Status: DC

## 2014-06-16 NOTE — Patient Instructions (Signed)
TRY [facility] FIRST  Amoxicillin; Clavulanic Acid chewable tablets What is this medicine? AMOXICILLIN; CLAVULANIC ACID (a mox i SIL in; KLAV yoo lan ic AS id) is a penicillin antibiotic. It is used to treat certain kinds of bacterial infections. It It will not work for colds, flu, or other viral infections. This medicine may be used for other purposes; ask your health care provider or pharmacist if you have questions. COMMON BRAND NAME(S): Augmentin What should I tell my health care provider before I take this medicine? They need to know if you have any of these conditions: -bowel disease, like colitis -kidney disease -liver disease -mononucleosis -phenylketonuria -an unusual or allergic reaction to amoxicillin, penicillin, cephalosporin, other antibiotics, clavulanic acid, other medicines, foods, dyes, or preservatives -pregnant or trying to get pregnant -breast-feeding How should I use this medicine? Take this medicine by mouth. Chew it completely before swallowing. Follow the directions on the prescription label. Take this medicine at the start of a meal or snack. Take your medicine at regular intervals. Do not take your medicine more often than directed. Take all of your medicine as directed even if you think you are better. Do not skip doses or stop your medicine early. Talk to your pediatrician regarding the use of this medicine in children. While this drug may be prescribed for selected conditions, precautions do apply. Overdosage: If you think you have taken too much of this medicine contact a poison control center or emergency room at once. NOTE: This medicine is only for you. Do not share this medicine with others. What if I miss a dose? If you miss a dose, take it as soon as you can. If it is almost time for your next dose, take only that dose. Do not take double or extra doses. What may interact with this medicine? -allopurinol -anticoagulants -birth control  pills -methotrexate -probenecid This list may not describe all possible interactions. Give your health care provider a list of all the medicines, herbs, non-prescription drugs, or dietary supplements you use. Also tell them if you smoke, drink alcohol, or use illegal drugs. Some items may interact with your medicine. What should I watch for while using this medicine? Tell your doctor or health care professional if your symptoms do not improve. Do not treat diarrhea with over the counter products. Contact your doctor if you have diarrhea that lasts more than 2 days or if it is severe and watery. If you have diabetes, you may get a false-positive result for sugar in your urine. Check with your doctor or health care professional. Birth control pills may not work properly while you are taking this medicine. Talk to your doctor about using an extra method of birth control. What side effects may I notice from receiving this medicine? Side effects that you should report to your doctor or health care professional as soon as possible: -allergic reactions like skin rash, itching or hives, swelling of the face, lips, or tongue -breathing problems -dark urine -fever or chills, sore throat -redness, blistering, peeling or loosening of the skin, including inside the mouth -seizures -trouble passing urine or change in the amount of urine -unusual bleeding, bruising -unusually weak or tired -white patches or sores in the mouth or throat Side effects that usually do not require medical attention (report to your doctor or health care professional if they continue or are bothersome): -diarrhea -dizziness -headache -nausea, vomiting -stomach upset -vaginal or anal irritation This list may not describe all possible side effects. Call your  doctor for medical advice about side effects. You may report side effects to FDA at 1-800-FDA-1088. Where should I keep my medicine? Keep out of the reach of  children. Store at room temperature below 25 degrees C (77 degrees F). Keep container tightly closed. Throw away any unused medicine after the expiration date. NOTE: This sheet is a summary. It may not cover all possible information. If you have questions about this medicine, talk to your doctor, pharmacist, or health care provider.  2015, Elsevier/Gold Standard. (2007-09-01 11:38:22) Sinusitis Sinusitis is redness, soreness, and inflammation of the paranasal sinuses. Paranasal sinuses are air pockets within the bones of your face (beneath the eyes, the middle of the forehead, or above the eyes). In healthy paranasal sinuses, mucus is able to drain out, and air is able to circulate through them by way of your nose. However, when your paranasal sinuses are inflamed, mucus and air can become trapped. This can allow bacteria and other germs to grow and cause infection. Sinusitis can develop quickly and last only a short time (acute) or continue over a long period (chronic). Sinusitis that lasts for more than 12 weeks is considered chronic.  CAUSES  Causes of sinusitis include:  Allergies.  Structural abnormalities, such as displacement of the cartilage that separates your nostrils (deviated septum), which can decrease the air flow through your nose and sinuses and affect sinus drainage.  Functional abnormalities, such as when the small hairs (cilia) that line your sinuses and help remove mucus do not work properly or are not present. SIGNS AND SYMPTOMS  Symptoms of acute and chronic sinusitis are the same. The primary symptoms are pain and pressure around the affected sinuses. Other symptoms include:  Upper toothache.  Earache.  Headache.  Bad breath.  Decreased sense of smell and taste.  A cough, which worsens when you are lying flat.  Fatigue.  Fever.  Thick drainage from your nose, which often is green and may contain pus (purulent).  Swelling and warmth over the affected  sinuses. DIAGNOSIS  Your health care provider will perform a physical exam. During the exam, your health care provider may:  Look in your nose for signs of abnormal growths in your nostrils (nasal polyps).  Tap over the affected sinus to check for signs of infection.  View the inside of your sinuses (endoscopy) using an imaging device that has a light attached (endoscope). If your health care provider suspects that you have chronic sinusitis, one or more of the following tests may be recommended:  Allergy tests.  Nasal culture. A sample of mucus is taken from your nose, sent to a lab, and screened for bacteria.  Nasal cytology. A sample of mucus is taken from your nose and examined by your health care provider to determine if your sinusitis is related to an allergy. TREATMENT  Most cases of acute sinusitis are related to a viral infection and will resolve on their own within 10 days. Sometimes medicines are prescribed to help relieve symptoms (pain medicine, decongestants, nasal steroid sprays, or saline sprays).  However, for sinusitis related to a bacterial infection, your health care provider will prescribe antibiotic medicines. These are medicines that will help kill the bacteria causing the infection.  Rarely, sinusitis is caused by a fungal infection. In theses cases, your health care provider will prescribe antifungal medicine. For some cases of chronic sinusitis, surgery is needed. Generally, these are cases in which sinusitis recurs more than 3 times per year, despite other treatments. HOME CARE  INSTRUCTIONS   Drink plenty of water. Water helps thin the mucus so your sinuses can drain more easily.  Use a humidifier.  Inhale steam 3 to 4 times a day (for example, sit in the bathroom with the shower running).  Apply a warm, moist washcloth to your face 3 to 4 times a day, or as directed by your health care provider.  Use saline nasal sprays to help moisten and clean your  sinuses.  Take medicines only as directed by your health care provider.  If you were prescribed either an antibiotic or antifungal medicine, finish it all even if you start to feel better. SEEK IMMEDIATE MEDICAL CARE IF:  You have increasing pain or severe headaches.  You have nausea, vomiting, or drowsiness.  You have swelling around your face.  You have vision problems.  You have a stiff neck.  You have difficulty breathing. MAKE SURE YOU:   Understand these instructions.  Will watch your condition.  Will get help right away if you are not doing well or get worse. Document Released: 06/08/2005 Document Revised: 10/23/2013 Document Reviewed: 06/23/2011 [facility] Patient Information 2015 [place], [place]. This information is not intended to replace advice given to you by your health care provider. Make sure you discuss any questions you have with your health care provider.

## 2014-06-16 NOTE — Progress Notes (Signed)
Chief Complaint:  Chief Complaint  Patient presents with  . Cough    very thick sputum x3 days  . chest congestion  . Fever    HPI: Kristin Ina, PhD is a 72 y.o. female who is here for a 4  Day hx  of productive cough, Thick sputum, tan color Malaise, she ahs been eating and drinking ok but feels fatigue but not like a truck has run over her She has generlaized malaise, she has HA, has taken echinacea, has taken benadryl and vitamin c and tylenol without releif She has had PNA in 1980s x 2 and also has had CHF and is worried about ninfection Weight gain due to increase eating, deneis CP, SOB or leg swelling or DOE  She has had PNA vaccine in the 80s and 98 vaccine booster  Wt Readings from Last 3 Encounters:  06/16/14 223 lb 6.4 oz (101.334 kg)  05/22/14 220 lb 6.4 oz (99.973 kg)  03/23/14 212 lb (96.163 kg)     Past Medical History  Diagnosis Date  . Coronary artery disease   . CHF (congestive heart failure)     Dilated cardiomyopathy. Her original ejection fraction was 10-20%. Her ejection fraction has improved to 57% by 2009  . Hypertension   . Dyslipidemia   . Chest pain   . Myocardial infarction   . Depression   . Obesity   . Headache(784.0)   . Carpal tunnel syndrome     Left Hand  . GERD (gastroesophageal reflux disease)   . Lower back pain   . Hemorrhoids   . History of anxiety   . Osteoarthritis     Severe with collapse of the left hip  . Foot drop, left    Past Surgical History  Procedure Laterality Date  . Tubal ligation    . Cardiac catheterization      Ejection Fraction was around 45-50%  . Replacement total hip w/  resurfacing implants  2002    left  . Hysteroscopy  09/03/2011    Procedure: HYSTEROSCOPY;  Surgeon: Logan Bores, MD;  Location: [facility];  Service: Gynecology;  Laterality: N/A;  With Resection of polyp with truclear system   History   Social History  . Marital Status: Divorced    Spouse Name: N/A    Number  of Children: N/A  . Years of Education: N/A   Social History Main Topics  . Smoking status: Never Smoker   . Smokeless tobacco: None  . Alcohol Use: No  . Drug Use: No  . Sexual Activity: Yes    Birth Control/ Protection: Surgical   Other Topics Concern  . None   Social History Narrative   Family History  Problem Relation Age of Onset  . Stroke Mother   . Cancer Sister   . Stroke Maternal Grandfather    Allergies  Allergen Reactions  . Nickel Itching and Rash   Prior to Admission medications   Medication Sig Start Date End Date Taking? Authorizing Provider  aspirin 81 MG tablet Take 81 mg by mouth daily.    Yes Historical Provider, MD  atorvastatin (LIPITOR) 20 MG tablet Take 1 tablet (20 mg total) by mouth daily at 6 PM. 01/16/14  Yes Thayer Headings, MD  carvedilol (COREG) 12.5 MG tablet Take 1 tablet (12.5 mg total) by mouth 2 (two) times daily with a meal. 05/24/14  Yes Thayer Headings, MD  furosemide (LASIX) 40 MG tablet Take 1 tablet (  40 mg total) by mouth daily. 12/21/13  Yes Thayer Headings, MD  lisinopril (PRINIVIL,ZESTRIL) 10 MG tablet Take 1 tablet (10 mg total) by mouth daily. 12/21/13  Yes Thayer Headings, MD  LORazepam (ATIVAN) 0.5 MG tablet Take 0.5 mg by mouth daily as needed. For anxiety   Yes Historical Provider, MD  Multiple Vitamin (MULTI-VITAMIN PO) Take 1 tablet by mouth daily.    Yes Historical Provider, MD  naproxen (NAPROSYN) 500 MG tablet Take 250 mg by mouth daily as needed. For pain   Yes Historical Provider, MD  nitroGLYCERIN (NITROSTAT) 0.4 MG SL tablet Place 1 tablet (0.4 mg total) under the tongue every 5 (five) minutes as needed for chest pain. 03/02/14  Yes Thayer Headings, MD  Omega-3 Fatty Acids (FISH OIL) 1200 MG CAPS Take 1 capsule by mouth daily.   Yes Historical Provider, MD  pantoprazole (PROTONIX) 40 MG tablet Take 40 mg by mouth daily.    Yes Historical Provider, MD  venlafaxine XR (EFFEXOR-XR) 75 MG 24 hr capsule Take 75 mg by mouth daily.  04/03/14  Yes Historical Provider, MD  Vitamin D, Ergocalciferol, (DRISDOL) 50000 UNITS CAPS Take by mouth every 30 (thirty) days. 03/24/12  Yes Historical Provider, MD     ROS: The patient denies night sweats, unintentional weight loss, chest pain, palpitations, wheezing, dyspnea on exertion, nausea, vomiting, abdominal pain, dysuria, hematuria, melena, numbness, or tingling.   All other systems have been reviewed and were otherwise negative with the exception of those mentioned in the HPI and as above.    PHYSICAL EXAM: Filed Vitals:   06/16/14 1459  BP: 122/70  Pulse: 78  Temp: 98.6 F (37 C)  Resp: 16   Filed Vitals:   06/16/14 1459  Height: 5\' 7"  (1.702 m)  Weight: 223 lb 6.4 oz (101.334 kg)   Body mass index is 34.98 kg/(m^2).  General: Alert, no acute distress HEENT:  Normocephalic, atraumatic, oropharynx patent. EOMI, PERRLA, TM nl, + sinus tenderness maxillary sinuses bialterally. Boggy nares.  Cardiovascular:  Regular rate and rhythm, no rubs murmurs or gallops.  Radial pulse intact. No pedal edema.  Respiratory: Clear to auscultation bilaterally.  No wheezes, rales, or rhonchi.  No cyanosis, no use of accessory musculature GI: No organomegaly, abdomen is soft and non-tender, positive bowel sounds.  No masses. Skin: No rashes. Neurologic: Facial musculature symmetric. Psychiatric: Patient is appropriate throughout our interaction. Lymphatic: No cervical lymphadenopathy Musculoskeletal: Gait intact.   LABS: Results for orders placed or performed in visit on 94/85/46  Basic metabolic panel  Result Value Ref Range   Sodium 138 135 - 145 mEq/L   Potassium 4.5 3.5 - 5.1 mEq/L   Chloride 103 96 - 112 mEq/L   CO2 29 19 - 32 mEq/L   Glucose, Bld 90 70 - 99 mg/dL   BUN 23 6 - 23 mg/dL   Creatinine, Ser 0.8 0.4 - 1.2 mg/dL   Calcium 9.3 8.4 - 10.5 mg/dL   GFR 77.46 >60.00 mL/min  Lipid panel  Result Value Ref Range   Cholesterol 139 0 - 200 mg/dL   Triglycerides  101.0 0.0 - 149.0 mg/dL   HDL 50.00 >39.00 mg/dL   VLDL 20.2 0.0 - 40.0 mg/dL   LDL Cholesterol 69 0 - 99 mg/dL   Total CHOL/HDL Ratio 3   Hepatic function panel  Result Value Ref Range   Total Bilirubin 0.8 0.2 - 1.2 mg/dL   Bilirubin, Direct 0.1 0.0 - 0.3 mg/dL   Alkaline Phosphatase  66 39 - 117 U/L   AST 28 0 - 37 U/L   ALT 22 0 - 35 U/L   Total Protein 7.1 6.0 - 8.3 g/dL   Albumin 4.1 3.5 - 5.2 g/dL     EKG/XRAY:   Primary read interpreted by Dr. Marin Comment at [facility].   ASSESSMENT/PLAN: Encounter Diagnoses  Name Primary?  . Acute maxillary sinusitis, recurrence not specified Yes  . Cough   . PND (post-nasal drip)    Decline chest xray , will return for CXR if worse Rx augmentin Otc nasacort and robitussin prn  F/u prn   Gross sideeffects, risk and benefits, and alternatives of medications d/w patient. Patient is aware that all medications have potential sideeffects and we are unable to predict every sideeffect or drug-drug interaction that may occur.  , [place], DO 06/16/2014 3:52 PM

## 2014-06-27 DIAGNOSIS — F329 Major depressive disorder, single episode, unspecified: Secondary | ICD-10-CM | POA: Diagnosis not present

## 2014-07-11 DIAGNOSIS — F329 Major depressive disorder, single episode, unspecified: Secondary | ICD-10-CM | POA: Diagnosis not present

## 2014-07-25 DIAGNOSIS — F329 Major depressive disorder, single episode, unspecified: Secondary | ICD-10-CM | POA: Diagnosis not present

## 2014-07-30 ENCOUNTER — Other Ambulatory Visit: Payer: Self-pay

## 2014-07-30 MED ORDER — LISINOPRIL 10 MG PO TABS: 10.0000 mg | ORAL_TABLET | Freq: Every day | ORAL | Status: DC

## 2014-08-07 DIAGNOSIS — L820 Inflamed seborrheic keratosis: Secondary | ICD-10-CM | POA: Diagnosis not present

## 2014-08-07 DIAGNOSIS — D1801 Hemangioma of skin and subcutaneous tissue: Secondary | ICD-10-CM | POA: Diagnosis not present

## 2014-08-08 DIAGNOSIS — F329 Major depressive disorder, single episode, unspecified: Secondary | ICD-10-CM | POA: Diagnosis not present

## 2014-08-22 DIAGNOSIS — F329 Major depressive disorder, single episode, unspecified: Secondary | ICD-10-CM | POA: Diagnosis not present

## 2014-08-31 ENCOUNTER — Other Ambulatory Visit: Payer: Self-pay | Admitting: *Deleted

## 2014-08-31 MED ORDER — FUROSEMIDE 40 MG PO TABS: 40.0000 mg | ORAL_TABLET | Freq: Every day | ORAL | Status: DC

## 2014-08-31 MED ORDER — NITROGLYCERIN 0.4 MG SL SUBL: 0.4000 mg | SUBLINGUAL_TABLET | SUBLINGUAL | Status: DC | PRN

## 2014-09-05 DIAGNOSIS — F329 Major depressive disorder, single episode, unspecified: Secondary | ICD-10-CM | POA: Diagnosis not present

## 2014-09-18 ENCOUNTER — Other Ambulatory Visit: Payer: Self-pay | Admitting: Cardiovascular Disease

## 2014-09-18 NOTE — Telephone Encounter (Signed)
Approved      Disp Refills Start End    carvedilol (COREG) 12.5 MG tablet 60 tablet 11 05/24/2014     Sig - Route:  Take 1 tablet (12.5 mg total) by mouth 2 (two) times daily with a meal. - Oral    Class:  Normal    DAW:  No    Authorizing Provider:  Thayer Headings, MD    Ordering User:  Juventino Slovak, CMA

## 2014-09-19 ENCOUNTER — Other Ambulatory Visit: Payer: Self-pay | Admitting: *Deleted

## 2014-09-19 DIAGNOSIS — F329 Major depressive disorder, single episode, unspecified: Secondary | ICD-10-CM | POA: Diagnosis not present

## 2014-09-19 MED ORDER — CARVEDILOL 12.5 MG PO TABS: 12.5000 mg | ORAL_TABLET | Freq: Two times a day (BID) | ORAL | Status: DC

## 2014-09-19 NOTE — Telephone Encounter (Signed)
Pt here in office requesting refill carvedilol to [facility]/ done.

## 2014-10-17 DIAGNOSIS — F329 Major depressive disorder, single episode, unspecified: Secondary | ICD-10-CM | POA: Diagnosis not present

## 2014-10-20 ENCOUNTER — Ambulatory Visit (INDEPENDENT_AMBULATORY_CARE_PROVIDER_SITE_OTHER): Payer: Medicare Other | Admitting: Emergency Medicine

## 2014-10-20 ENCOUNTER — Ambulatory Visit (INDEPENDENT_AMBULATORY_CARE_PROVIDER_SITE_OTHER): Payer: Medicare Other

## 2014-10-20 VITALS — BP 122/52 | HR 80 | Temp 98.3°F | Ht 66.5 in | Wt 229.4 lb

## 2014-10-20 DIAGNOSIS — M25511 Pain in right shoulder: Secondary | ICD-10-CM

## 2014-10-20 DIAGNOSIS — J209 Acute bronchitis, unspecified: Secondary | ICD-10-CM | POA: Diagnosis not present

## 2014-10-20 DIAGNOSIS — J0140 Acute pansinusitis, unspecified: Secondary | ICD-10-CM | POA: Diagnosis not present

## 2014-10-20 MED ORDER — AMOXICILLIN-POT CLAVULANATE 875-125 MG PO TABS: 1.0000 | ORAL_TABLET | Freq: Two times a day (BID) | ORAL | Status: DC

## 2014-10-20 MED ORDER — HYDROCOD POLST-CPM POLST ER 10-8 MG/5ML PO SUER: 5.0000 mL | Freq: Two times a day (BID) | ORAL | Status: DC

## 2014-10-20 MED ORDER — PSEUDOEPHEDRINE-GUAIFENESIN ER 60-600 MG PO TB12
1.0000 | ORAL_TABLET | Freq: Two times a day (BID) | ORAL | Status: DC
Start: 2014-10-20 — End: 2014-10-30

## 2014-10-20 MED ORDER — NAPROXEN SODIUM 550 MG PO TABS: 550.0000 mg | ORAL_TABLET | Freq: Two times a day (BID) | ORAL | Status: DC

## 2014-10-20 NOTE — Addendum Note (Signed)
Addended by: Roselee Culver on: 10/20/2014 10:24 AM   Modules accepted: Orders

## 2014-10-20 NOTE — Progress Notes (Signed)
[facility] [address] [place] [address] [phone]  Date:  10/20/2014   Name:  Kristin GALLEGO, PhD   DOB:  September 14, 1940   MRN:  [id number]  PCP:  Donnajean Lopes, MD    Chief Complaint: URI   History of Present Illness:  [name], PhD is a 74 y.o. very pleasant female patient who presents with the following:  Ill since Thursday and has a cough that is worse at night. Has purulent sputum.  No wheezing or shortness of breath Mucopurulent nasal drainage No fever or chills. No stool change or rash No nausea or vomiting. Has long history of foot drop and falls frequently as a consequence.   Has right shoulder pain and decreased ROM due to repeated falls No improvement with over the counter medications or other home remedies.  Denies other complaint or health concern today.   Patient Active Problem List   Diagnosis Date Noted  . CHF (congestive heart failure) 03/11/2011  . Hyperlipidemia 03/11/2011  . Carpal tunnel syndrome   . GERD (gastroesophageal reflux disease)     Past Medical History  Diagnosis Date  . Coronary artery disease   . CHF (congestive heart failure)     Dilated cardiomyopathy. Her original ejection fraction was 10-20%. Her ejection fraction has improved to 57% by 2009  . Hypertension   . Dyslipidemia   . Chest pain   . Myocardial infarction   . Depression   . Obesity   . Headache(784.0)   . Carpal tunnel syndrome     Left Hand  . GERD (gastroesophageal reflux disease)   . Lower back pain   . Hemorrhoids   . History of anxiety   . Osteoarthritis     Severe with collapse of the left hip  . Foot drop, left     Past Surgical History  Procedure Laterality Date  . Tubal ligation    . Cardiac catheterization      Ejection Fraction was around 45-50%  . Replacement total hip w/  resurfacing implants  2002    left  . Hysteroscopy  09/03/2011    Procedure: HYSTEROSCOPY;  Surgeon: Logan Bores, MD;   Location: [facility];  Service: Gynecology;  Laterality: N/A;  With Resection of polyp with truclear system    History  Substance Use Topics  . Smoking status: Never Smoker   . Smokeless tobacco: Not on file  . Alcohol Use: No    Family History  Problem Relation Age of Onset  . Stroke Mother   . Cancer Sister   . Stroke Maternal Grandfather     Allergies  Allergen Reactions  . Nickel Itching and Rash    Medication list has been reviewed and updated.  Current Outpatient Prescriptions on File Prior to Visit  Medication Sig Dispense Refill  . aspirin 81 MG tablet Take 81 mg by mouth daily.     Marland Kitchen atorvastatin (LIPITOR) 20 MG tablet Take 1 tablet (20 mg total) by mouth daily at 6 PM. 30 tablet 11  . carvedilol (COREG) 12.5 MG tablet Take 1 tablet (12.5 mg total) by mouth 2 (two) times daily with a meal. 60 tablet 11  . furosemide (LASIX) 40 MG tablet Take 1 tablet (40 mg total) by mouth daily. 30 tablet 5  . lisinopril (PRINIVIL,ZESTRIL) 10 MG tablet Take 1 tablet (10 mg total) by mouth daily. 30 tablet 6  . LORazepam (ATIVAN) 0.5 MG tablet Take 0.5 mg by mouth daily  as needed. For anxiety    . Multiple Vitamin (MULTI-VITAMIN PO) Take 1 tablet by mouth daily.     . naproxen (NAPROSYN) 500 MG tablet Take 250 mg by mouth daily as needed. For pain    . nitroGLYCERIN (NITROSTAT) 0.4 MG SL tablet Place 1 tablet (0.4 mg total) under the tongue every 5 (five) minutes as needed for chest pain. 25 tablet 5  . Omega-3 Fatty Acids (FISH OIL) 1200 MG CAPS Take 1 capsule by mouth daily.    . pantoprazole (PROTONIX) 40 MG tablet Take 40 mg by mouth daily.     Marland Kitchen venlafaxine XR (EFFEXOR-XR) 75 MG 24 hr capsule Take 75 mg by mouth daily.  0  . Vitamin D, Ergocalciferol, (DRISDOL) 50000 UNITS CAPS Take by mouth every 30 (thirty) days.     No current facility-administered medications on file prior to visit.    Review of Systems:  Review of Systems  Constitutional: Negative for fever, chills and  fatigue.  HENT: Negative for congestion, ear pain, hearing loss, postnasal drip, rhinorrhea and sinus pressure.   Eyes: Negative for discharge and redness.  Respiratory: Negative for  shortness of breath and wheezing.   Cardiovascular: Negative for chest pain and leg swelling.  Gastrointestinal: Negative for nausea, vomiting, abdominal pain, constipation and blood in stool.  Genitourinary: Negative for dysuria, urgency and frequency.  Musculoskeletal: Negative for neck stiffness.  Skin: Negative for rash.  Neurological: Negative for seizures, weakness and headaches.     Physical Examination: Filed Vitals:   10/20/14 0921  BP: 122/52  Pulse: 80  Temp: 98.3 F (36.8 C)   Filed Vitals:   10/20/14 0921  Height: 5' 6.5" (1.689 m)  Weight: 229 lb 6 oz (104.044 kg)   Body mass index is 36.47 kg/(m^2). Ideal Body Weight: Weight in (lb) to have BMI = 25: 156.9  GEN: WDWN, NAD, Non-toxic, A & O x 3 HEENT: Atraumatic, Normocephalic. Neck supple. No masses, No LAD. Ears and Nose: No external deformity. CV: RRR, No M/G/R. No JVD. No thrill. No extra heart sounds. PULM: CTA B, no wheezes, crackles, rhonchi. No retractions. No resp. distress. No accessory muscle use. ABD: S, NT, ND, +BS. No rebound. No HSM. EXTR: No c/c/e NEURO Normal gait.  PSYCH: Normally interactive. Conversant. Not depressed or anxious appearing.  Calm demeanor.    Assessment and Plan: Right shoulder pain Sinusitis  Bronchitis   Signed Ellison Carwin, MD   [facility] reading (PRIMARY) by  Dr. Ouida Sills.  negative.

## 2014-10-20 NOTE — Patient Instructions (Signed)

## 2014-10-23 DIAGNOSIS — H33312 Horseshoe tear of retina without detachment, left eye: Secondary | ICD-10-CM | POA: Diagnosis not present

## 2014-10-23 DIAGNOSIS — H43813 Vitreous degeneration, bilateral: Secondary | ICD-10-CM | POA: Diagnosis not present

## 2014-10-30 ENCOUNTER — Ambulatory Visit (INDEPENDENT_AMBULATORY_CARE_PROVIDER_SITE_OTHER): Payer: Medicare Other | Admitting: Cardiovascular Disease

## 2014-10-30 ENCOUNTER — Encounter: Payer: Self-pay | Admitting: Cardiovascular Disease

## 2014-10-30 VITALS — BP 90/60 | HR 71 | Ht 66.5 in | Wt 224.0 lb

## 2014-10-30 DIAGNOSIS — E785 Hyperlipidemia, unspecified: Secondary | ICD-10-CM

## 2014-10-30 DIAGNOSIS — I509 Heart failure, unspecified: Secondary | ICD-10-CM

## 2014-10-30 DIAGNOSIS — I5022 Chronic systolic (congestive) heart failure: Secondary | ICD-10-CM

## 2014-10-30 NOTE — Patient Instructions (Signed)
Medication Instructions:  DISCONTINUE Lipitor 20 mg  Labwork: Your physician recommends that you have lab work:  TODAY - cholesterol, liver, bmet Your physician recommends that you return for lab work in: 6 months on the day of or a few days before your office visit with Dr. Acie Fredrickson.  You will need to FAST for this appointment - nothing to eat or drink after midnight the night before except water.   Testing/Procedures: None Ordered   Follow-Up: Your physician wants you to follow-up in: 6 months with Dr. Acie Fredrickson.  You will receive a reminder letter in the mail two months in advance. If you don't receive a letter, please call our office to schedule the follow-up appointment.

## 2014-10-30 NOTE — Progress Notes (Signed)
Cardiology Office Note   Date:  10/30/2014   ID:  Kristin Ina, PhD, DOB 1940/10/23, MRN [id number]  PCP:  Kristin Lopes, MD  Cardiologist:   Kristin Headings, MD   Chief Complaint  Patient presents with  . Follow-up    chf   1. CHF- LV EF has improved dramatically 2. Hyperlipidemia 3. Left hip replacement  History of Present Illness:  74 year old female with history of congestive heart failure. Her original ejection fraction was 10-20%. Her ejection fraction has dramatically improved and is now 57% ( Echo 2009). She had an echocardiogram in February 2011 that revealed normal left ventricle systolic function.  She's had 2 heart catheterizations. Her last cardiac cath was in 2006 which revealed moderate irregularities.  She's not having any episodes of chest pain or shortness breath. She works out a regular basis at least 2-3 times a week.  She has gained 20 lbs over the past several months. She has a foot ulcer in her left foot and has not been in the pool.  She's not had any angina-like chest pain. She did have some stress-related chest discomfort around Christmastime but that has since resolved.  October 18, 2012:  Kristin Mack has done well for the past 6 months. She has not had any dyspnea. She has had lots of left hip pain and has not been able to exercise as much. She is swimming 4 times a week.  Nov. 24, 2014:  Kristin Mack is doing well. She has been having some acid reflux ( dry cough).  She has been trying eating smaller portions, earing earlier in the evening, avoiding spicy foods.   May 27,2015: Kristin Mack is doing well. No CP or dyspnea.  Stressed out over family issues. Her sons were living with her for several months. She has gone up on her Effexor.  She has had some left thigh problems.   Dec. 1, 2015:  Kristin Mack is seen today for follow up of her CHF. She has done well.  She has not been swimming as much recently - has an ulcer on the  bottom of her left great toe.   Oct 30, 2014:  Kristin Ina, PhD is a 74 y.o. female who presents for  Follow up of her CHF. Is getting over a cold. Has gained some weight.  Is back exercising.  Does water exercises.  Likes to go line dancing .   No CP , breathing is ok.  Has lots of stress with her 30 yo son.    Past Medical History  Diagnosis Date  . Coronary artery disease   . CHF (congestive heart failure)     Dilated cardiomyopathy. Her original ejection fraction was 10-20%. Her ejection fraction has improved to 57% by 2009  . Hypertension   . Dyslipidemia   . Chest pain   . Myocardial infarction   . Depression   . Obesity   . Headache(784.0)   . Carpal tunnel syndrome     Left Hand  . GERD (gastroesophageal reflux disease)   . Lower back pain   . Hemorrhoids   . History of anxiety   . Osteoarthritis     Severe with collapse of the left hip  . Foot drop, left     Past Surgical History  Procedure Laterality Date  . Tubal ligation    . Cardiac catheterization      Ejection Fraction was around 45-50%  . Replacement total hip w/  resurfacing implants  2002  left  . Hysteroscopy  09/03/2011    Procedure: HYSTEROSCOPY;  Surgeon: Logan Bores, MD;  Location: [facility];  Service: Gynecology;  Laterality: N/A;  With Resection of polyp with truclear system     Current Outpatient Prescriptions  Medication Sig Dispense Refill  . aspirin 81 MG tablet Take 81 mg by mouth daily.     Marland Kitchen atorvastatin (LIPITOR) 20 MG tablet Take 1 tablet (20 mg total) by mouth daily at 6 PM. 30 tablet 11  . atorvastatin (LIPITOR) 40 MG tablet Take 40 mg by mouth daily.  1  . carvedilol (COREG) 12.5 MG tablet Take 1 tablet (12.5 mg total) by mouth 2 (two) times daily with a meal. 60 tablet 11  . furosemide (LASIX) 40 MG tablet Take 1 tablet (40 mg total) by mouth daily. 30 tablet 5  . lisinopril (PRINIVIL,ZESTRIL) 10 MG tablet Take 1 tablet (10 mg total) by mouth daily. 30 tablet 6    . LORazepam (ATIVAN) 0.5 MG tablet Take 0.5 mg by mouth daily as needed. For anxiety    . Multiple Vitamin (MULTI-VITAMIN PO) Take 1 tablet by mouth daily.     . naproxen (NAPROSYN) 500 MG tablet Take 250 mg by mouth daily as needed. For pain    . nitroGLYCERIN (NITROSTAT) 0.4 MG SL tablet Place 1 tablet (0.4 mg total) under the tongue every 5 (five) minutes as needed for chest pain. 25 tablet 5  . Omega-3 Fatty Acids (FISH OIL) 1200 MG CAPS Take 1 capsule by mouth daily.    . pantoprazole (PROTONIX) 40 MG tablet Take 40 mg by mouth 2 (two) times daily.     Marland Kitchen venlafaxine XR (EFFEXOR-XR) 75 MG 24 hr capsule Take 75 mg by mouth daily.  0  . Vitamin D, Ergocalciferol, (DRISDOL) 50000 UNITS CAPS Take by mouth every 30 (thirty) days.     No current facility-administered medications for this visit.    Allergies:   Nickel    Social History:  The patient  reports that she has never smoked. She does not have any smokeless tobacco history on file. She reports that she does not drink alcohol or use illicit drugs.   Family History:  The patient's family history includes Cancer in her sister; Stroke in her maternal grandfather and mother.    ROS:  Please see the history of present illness.    Review of Systems: Constitutional:  denies fever, chills, diaphoresis, appetite change and fatigue.  HEENT: denies photophobia, eye pain, redness, hearing loss, ear pain, congestion, sore throat, rhinorrhea, sneezing, neck pain, neck stiffness and tinnitus.  Respiratory: denies SOB, DOE, cough, chest tightness, and wheezing.  Cardiovascular: denies chest pain, palpitations and leg swelling.  Gastrointestinal: denies nausea, vomiting, abdominal pain, diarrhea, constipation, blood in stool.  Genitourinary: denies dysuria, urgency, frequency, hematuria, flank pain and difficulty urinating.  Musculoskeletal: denies  myalgias, back pain, joint swelling, arthralgias and gait problem.   Skin: denies pallor, rash  and wound.  Neurological: denies dizziness, seizures, syncope, weakness, light-headedness, numbness and headaches.   Hematological: denies adenopathy, easy bruising, personal or family bleeding history.  Psychiatric/ Behavioral: denies suicidal ideation, mood changes, confusion, nervousness, sleep disturbance and agitation.       All other systems are reviewed and negative.    PHYSICAL EXAM: VS:  BP 90/60 mmHg  Pulse 71  Ht 5' 6.5" (1.689 m)  Wt 101.606 kg (224 lb)  BMI 35.62 kg/m2 , BMI Body mass index is 35.62 kg/(m^2). GEN: Well nourished, well developed,  in no acute distress HEENT: normal Neck: no JVD, carotid bruits, or masses Cardiac: RRR; no murmurs, rubs, or gallops,no edema  Respiratory:  clear to auscultation bilaterally, normal work of breathing GI: soft, nontender, nondistended, + BS MS: no deformity or atrophy Skin: warm and dry, no rash Neuro:  Strength and sensation are intact Psych: normal   EKG:  EKG is ordered today. The ekg ordered today demonstrates :  NSR at 71.  NS ST abn.    Recent Labs: 11/07/2013: ALT 22; BUN 23; Creatinine 0.8; Potassium 4.5; Sodium 138    Lipid Panel    Component Value Date/Time   CHOL 139 11/07/2013 0913   TRIG 101.0 11/07/2013 0913   HDL 50.00 11/07/2013 0913   CHOLHDL 3 11/07/2013 0913   VLDL 20.2 11/07/2013 0913   LDLCALC 69 11/07/2013 0913      Wt Readings from Last 3 Encounters:  10/30/14 101.606 kg (224 lb)  10/20/14 104.044 kg (229 lb 6 oz)  06/16/14 101.334 kg (223 lb 6.4 oz)      Other studies Reviewed: Additional studies/ records that were reviewed today include: . Review of the above records demonstrates:    ASSESSMENT AND PLAN:  1. CHF- LV EF has improved dramatically - no symptoms at this point , cont current meds   2. Hyperlipidemia - will check fasting lipids today    3. Left hip replacement   Current medicines are reviewed at length with the patient today.  The patient does not have  concerns regarding medicines.  The following changes have been made:  no change  Labs/ tests ordered today include:  No orders of the defined types were placed in this encounter.    Disposition:   FU with me in 6 months      Nahser, Wonda Cheng, MD  10/30/2014 4:38 PM    [facility] [place], [place], [place]  [address] Phone: [phone]; Fax: [phone]   [facility]  [address] [place], [place]  [address] [phone]    Fax [phone]

## 2014-10-31 DIAGNOSIS — F329 Major depressive disorder, single episode, unspecified: Secondary | ICD-10-CM | POA: Diagnosis not present

## 2014-10-31 LAB — LIPID PANEL
Cholesterol: 141 mg/dL (ref 0–200)
HDL: 37.2 mg/dL — ABNORMAL LOW (ref >39.00)
LDL Cholesterol: 67 mg/dL (ref 0–99)
NonHDL: 103.8
Total CHOL/HDL Ratio: 4
Triglycerides: 185 mg/dL — ABNORMAL HIGH (ref 0.0–149.0)
VLDL: 37 mg/dL (ref 0.0–40.0)

## 2014-10-31 LAB — HEPATIC FUNCTION PANEL
ALT: 18 U/L (ref 0–35)
AST: 21 U/L (ref 0–37)
Albumin: 3.9 g/dL (ref 3.5–5.2)
Alkaline Phosphatase: 100 U/L (ref 39–117)
Bilirubin, Direct: 0.1 mg/dL (ref 0.0–0.3)
Total Bilirubin: 0.4 mg/dL (ref 0.2–1.2)
Total Protein: 7.2 g/dL (ref 6.0–8.3)

## 2014-10-31 LAB — BASIC METABOLIC PANEL
BUN: 20 mg/dL (ref 6–23)
CO2: 33 mEq/L — ABNORMAL HIGH (ref 19–32)
Calcium: 9.4 mg/dL (ref 8.4–10.5)
Chloride: 99 mEq/L (ref 96–112)
Creatinine, Ser: 0.98 mg/dL (ref 0.40–1.20)
GFR: 59.36 mL/min — ABNORMAL LOW (ref >60.00)
Glucose, Bld: 83 mg/dL (ref 70–99)
Potassium: 4.8 mEq/L (ref 3.5–5.1)
Sodium: 136 mEq/L (ref 135–145)

## 2014-11-14 DIAGNOSIS — F329 Major depressive disorder, single episode, unspecified: Secondary | ICD-10-CM | POA: Diagnosis not present

## 2014-12-11 DIAGNOSIS — F900 Attention-deficit hyperactivity disorder, predominantly inattentive type: Secondary | ICD-10-CM | POA: Diagnosis not present

## 2014-12-11 DIAGNOSIS — F329 Major depressive disorder, single episode, unspecified: Secondary | ICD-10-CM | POA: Diagnosis not present

## 2014-12-12 DIAGNOSIS — H40013 Open angle with borderline findings, low risk, bilateral: Secondary | ICD-10-CM | POA: Diagnosis not present

## 2014-12-12 DIAGNOSIS — F329 Major depressive disorder, single episode, unspecified: Secondary | ICD-10-CM | POA: Diagnosis not present

## 2014-12-12 DIAGNOSIS — F900 Attention-deficit hyperactivity disorder, predominantly inattentive type: Secondary | ICD-10-CM | POA: Diagnosis not present

## 2014-12-26 DIAGNOSIS — F329 Major depressive disorder, single episode, unspecified: Secondary | ICD-10-CM | POA: Diagnosis not present

## 2015-01-09 DIAGNOSIS — F329 Major depressive disorder, single episode, unspecified: Secondary | ICD-10-CM | POA: Diagnosis not present

## 2015-01-21 ENCOUNTER — Other Ambulatory Visit: Payer: Self-pay

## 2015-01-21 NOTE — Telephone Encounter (Signed)
Approved      Disp Refills Start End    lisinopril (PRINIVIL,ZESTRIL) 10 MG tablet 30 tablet 6 07/30/2014     Sig - Route:  Take 1 tablet (10 mg total) by mouth daily. - Oral    Class:  Normal    DAW:  No    Authorizing Provider:  Thayer Headings, MD    Ordering User:  Guinevere Ferrari

## 2015-01-30 ENCOUNTER — Other Ambulatory Visit: Payer: Self-pay | Admitting: *Deleted

## 2015-01-30 MED ORDER — LISINOPRIL 10 MG PO TABS: 10.0000 mg | ORAL_TABLET | Freq: Every day | ORAL | Status: DC

## 2015-02-06 DIAGNOSIS — F329 Major depressive disorder, single episode, unspecified: Secondary | ICD-10-CM | POA: Diagnosis not present

## 2015-02-20 DIAGNOSIS — F329 Major depressive disorder, single episode, unspecified: Secondary | ICD-10-CM | POA: Diagnosis not present

## 2015-03-01 ENCOUNTER — Other Ambulatory Visit: Payer: Self-pay | Admitting: *Deleted

## 2015-03-01 MED ORDER — FUROSEMIDE 40 MG PO TABS: 40.0000 mg | ORAL_TABLET | Freq: Every day | ORAL | Status: DC

## 2015-03-06 DIAGNOSIS — F329 Major depressive disorder, single episode, unspecified: Secondary | ICD-10-CM | POA: Diagnosis not present

## 2015-03-20 DIAGNOSIS — F329 Major depressive disorder, single episode, unspecified: Secondary | ICD-10-CM | POA: Diagnosis not present

## 2015-03-25 DIAGNOSIS — Z23 Encounter for immunization: Secondary | ICD-10-CM | POA: Diagnosis not present

## 2015-03-26 DIAGNOSIS — F329 Major depressive disorder, single episode, unspecified: Secondary | ICD-10-CM | POA: Diagnosis not present

## 2015-04-09 DIAGNOSIS — F329 Major depressive disorder, single episode, unspecified: Secondary | ICD-10-CM | POA: Diagnosis not present

## 2015-04-17 DIAGNOSIS — F429 Obsessive-compulsive disorder, unspecified: Secondary | ICD-10-CM | POA: Diagnosis not present

## 2015-04-17 DIAGNOSIS — F329 Major depressive disorder, single episode, unspecified: Secondary | ICD-10-CM | POA: Diagnosis not present

## 2015-04-23 ENCOUNTER — Other Ambulatory Visit (INDEPENDENT_AMBULATORY_CARE_PROVIDER_SITE_OTHER): Payer: Medicare Other | Admitting: *Deleted

## 2015-04-23 DIAGNOSIS — I5022 Chronic systolic (congestive) heart failure: Secondary | ICD-10-CM | POA: Diagnosis not present

## 2015-04-23 DIAGNOSIS — E785 Hyperlipidemia, unspecified: Secondary | ICD-10-CM | POA: Diagnosis not present

## 2015-04-23 DIAGNOSIS — H43813 Vitreous degeneration, bilateral: Secondary | ICD-10-CM | POA: Diagnosis not present

## 2015-04-23 DIAGNOSIS — I509 Heart failure, unspecified: Secondary | ICD-10-CM | POA: Diagnosis not present

## 2015-04-23 DIAGNOSIS — E11621 Type 2 diabetes mellitus with foot ulcer: Secondary | ICD-10-CM | POA: Diagnosis not present

## 2015-04-23 DIAGNOSIS — I10 Essential (primary) hypertension: Secondary | ICD-10-CM | POA: Diagnosis not present

## 2015-04-23 LAB — HEPATIC FUNCTION PANEL
ALT: 14 U/L (ref 6–29)
AST: 19 U/L (ref 10–35)
Albumin: 3.9 g/dL (ref 3.6–5.1)
Alkaline Phosphatase: 91 U/L (ref 33–130)
Bilirubin, Direct: 0.2 mg/dL (ref <=0.2)
Indirect Bilirubin: 0.8 mg/dL (ref 0.2–1.2)
Total Bilirubin: 1 mg/dL (ref 0.2–1.2)
Total Protein: 6.6 g/dL (ref 6.1–8.1)

## 2015-04-23 LAB — HEMOGLOBIN A1C
Hgb A1c MFr Bld: 5.2 % (ref <5.7)
Mean Plasma Glucose: 103 mg/dL (ref <117)

## 2015-04-23 LAB — CBC WITH DIFFERENTIAL/PLATELET
Basophils Absolute: 0.1 10*3/uL (ref 0.0–0.1)
Basophils Relative: 1 % (ref 0–1)
Eosinophils Absolute: 0.3 10*3/uL (ref 0.0–0.7)
Eosinophils Relative: 5 % (ref 0–5)
HCT: 41.1 % (ref 36.0–46.0)
Hemoglobin: 14.6 g/dL (ref 12.0–15.0)
Lymphocytes Relative: 22 % (ref 12–46)
Lymphs Abs: 1.2 10*3/uL (ref 0.7–4.0)
MCH: 32.3 pg (ref 26.0–34.0)
MCHC: 35.5 g/dL (ref 30.0–36.0)
MCV: 90.9 fL (ref 78.0–100.0)
MPV: 11.6 fL (ref 8.6–12.4)
Monocytes Absolute: 0.3 10*3/uL (ref 0.1–1.0)
Monocytes Relative: 6 % (ref 3–12)
Neutro Abs: 3.7 10*3/uL (ref 1.7–7.7)
Neutrophils Relative %: 66 % (ref 43–77)
Platelets: 205 10*3/uL (ref 150–400)
RBC: 4.52 MIL/uL (ref 3.87–5.11)
RDW: 13.6 % (ref 11.5–15.5)
WBC: 5.6 10*3/uL (ref 4.0–10.5)

## 2015-04-23 LAB — LIPID PANEL
Cholesterol: 142 mg/dL (ref 125–200)
HDL: 47 mg/dL (ref >=46)
LDL Cholesterol: 73 mg/dL (ref <130)
Total CHOL/HDL Ratio: 3 Ratio (ref <=5.0)
Triglycerides: 112 mg/dL (ref <150)
VLDL: 22 mg/dL (ref <30)

## 2015-04-23 LAB — BASIC METABOLIC PANEL
BUN: 21 mg/dL (ref 7–25)
CO2: 26 mmol/L (ref 20–31)
Calcium: 9.3 mg/dL (ref 8.6–10.4)
Chloride: 102 mmol/L (ref 98–110)
Creat: 0.78 mg/dL (ref 0.60–0.93)
Glucose, Bld: 82 mg/dL (ref 65–99)
Potassium: 4.8 mmol/L (ref 3.5–5.3)
Sodium: 139 mmol/L (ref 135–146)

## 2015-04-23 LAB — HEPATITIS C ANTIBODY: HCV Ab: NEGATIVE

## 2015-04-23 NOTE — Addendum Note (Signed)
Addended by: Eulis Foster on: 04/23/2015 11:00 AM   Modules accepted: Orders

## 2015-04-24 LAB — URINALYSIS
Bilirubin Urine: NEGATIVE
Glucose, UA: NEGATIVE
Hgb urine dipstick: NEGATIVE
Ketones, ur: NEGATIVE
Nitrite: NEGATIVE
Protein, ur: NEGATIVE
Specific Gravity, Urine: 1.018 (ref 1.001–1.035)
pH: 5.5 (ref 5.0–8.0)

## 2015-04-24 LAB — MICROALBUMIN / CREATININE URINE RATIO
Creatinine, Urine: 123 mg/dL (ref 20–320)
Microalb Creat Ratio: 3 mcg/mg creat (ref <30)
Microalb, Ur: 0.4 mg/dL

## 2015-04-24 LAB — VITAMIN D 25 HYDROXY (VIT D DEFICIENCY, FRACTURES): Vit D, 25-Hydroxy: 46 ng/mL (ref 30–100)

## 2015-04-29 ENCOUNTER — Telehealth: Payer: Self-pay

## 2015-04-29 DIAGNOSIS — M21372 Foot drop, left foot: Secondary | ICD-10-CM | POA: Diagnosis not present

## 2015-04-29 DIAGNOSIS — E784 Other hyperlipidemia: Secondary | ICD-10-CM | POA: Diagnosis not present

## 2015-04-29 DIAGNOSIS — I10 Essential (primary) hypertension: Secondary | ICD-10-CM | POA: Diagnosis not present

## 2015-04-29 DIAGNOSIS — Z6833 Body mass index (BMI) 33.0-33.9, adult: Secondary | ICD-10-CM | POA: Diagnosis not present

## 2015-04-29 DIAGNOSIS — E11621 Type 2 diabetes mellitus with foot ulcer: Secondary | ICD-10-CM | POA: Diagnosis not present

## 2015-04-29 DIAGNOSIS — Z1389 Encounter for screening for other disorder: Secondary | ICD-10-CM | POA: Diagnosis not present

## 2015-04-29 DIAGNOSIS — I2510 Atherosclerotic heart disease of native coronary artery without angina pectoris: Secondary | ICD-10-CM | POA: Diagnosis not present

## 2015-04-29 DIAGNOSIS — Z0000 Encounter for general adult medical examination without abnormal findings: Secondary | ICD-10-CM | POA: Diagnosis not present

## 2015-04-29 DIAGNOSIS — I429 Cardiomyopathy, unspecified: Secondary | ICD-10-CM | POA: Diagnosis not present

## 2015-04-29 NOTE — Telephone Encounter (Signed)
Pt walked in requesting copies of recent labs.  Per Dr.Nasher. Labs look great Copy of labs provided to pt

## 2015-04-29 NOTE — Telephone Encounter (Signed)
FYI fwd to Dr.Nahser nurse Sharyn Lull. Pt aware and has been give an copy of her recent labs

## 2015-04-30 ENCOUNTER — Encounter: Payer: Self-pay | Admitting: Nurse Practitioner

## 2015-04-30 ENCOUNTER — Ambulatory Visit (INDEPENDENT_AMBULATORY_CARE_PROVIDER_SITE_OTHER): Payer: Medicare Other | Admitting: Cardiovascular Disease

## 2015-04-30 ENCOUNTER — Encounter: Payer: Self-pay | Admitting: Cardiovascular Disease

## 2015-04-30 VITALS — BP 104/80 | HR 73 | Ht 66.5 in | Wt 211.4 lb

## 2015-04-30 DIAGNOSIS — E785 Hyperlipidemia, unspecified: Secondary | ICD-10-CM | POA: Diagnosis not present

## 2015-04-30 DIAGNOSIS — I5022 Chronic systolic (congestive) heart failure: Secondary | ICD-10-CM

## 2015-04-30 NOTE — Patient Instructions (Signed)

## 2015-04-30 NOTE — Progress Notes (Signed)
Cardiology Office Note   Date:  04/30/2015   ID:  Kristin Ina, PhD, DOB [birth year], MRN [id number]  PCP:  Donnajean Lopes, MD  Cardiologist:   Thayer Headings, MD   Chief Complaint  Patient presents with  . Follow-up    CHf   1. CHF- LV EF has improved dramatically 2. Hyperlipidemia 3. Left hip replacement  History of Present Illness:  74 year old female with history of congestive heart failure. Her original ejection fraction was 10-20%. Her ejection fraction has dramatically improved and is now 57% ( Echo 2009). She had an echocardiogram in February 2011 that revealed normal left ventricle systolic function.  She's had 2 heart catheterizations. Her last cardiac cath was in 2006 which revealed moderate irregularities.  She's not having any episodes of chest pain or shortness breath. She works out a regular basis at least 2-3 times a week.  She has gained 20 lbs over the past several months. She has a foot ulcer in her left foot and has not been in the pool.  She's not had any angina-like chest pain. She did have some stress-related chest discomfort around Christmastime but that has since resolved.  October 18, 2012:  [name] has done well for the past 6 months. She has not had any dyspnea. She has had lots of left hip pain and has not been able to exercise as much. She is swimming 4 times a week.  Nov. 24, 2014:  [name] is doing well. She has been having some acid reflux ( dry cough).  She has been trying eating smaller portions, earing earlier in the evening, avoiding spicy foods.   May 27,2015: [name] is doing well. No CP or dyspnea.  Stressed out over family issues. Her sons were living with her for several months. She has gone up on her Effexor.  She has had some left thigh problems.   Dec. 1, 2015:  [name] is seen today for follow up of her CHF. She has done well.  She has not been swimming as much recently - has an ulcer on the  bottom of her left great toe.   Oct 30, 2014:  Kristin Ina, PhD is a 74 y.o. female who presents for  Follow up of her CHF. Is getting over a cold. Has gained some weight.  Is back exercising.  Does water exercises.  Likes to go line dancing .   No CP , breathing is ok.  Has lots of stress with her 37 yo son.   Nov. 8,  2016:  Doing well from a cardiac standpoint.  Lots of issues with GERD. Lots of stress ,    No Cp or dyspnea.    Past Medical History  Diagnosis Date  . Coronary artery disease   . CHF (congestive heart failure) (HCC)     Dilated cardiomyopathy. Her original ejection fraction was 10-20%. Her ejection fraction has improved to 57% by 2009  . Hypertension   . Dyslipidemia   . Chest pain   . Myocardial infarction ([place])   . Depression   . Obesity   . Headache(784.0)   . Carpal tunnel syndrome     Left Hand  . GERD (gastroesophageal reflux disease)   . Lower back pain   . Hemorrhoids   . History of anxiety   . Osteoarthritis     Severe with collapse of the left hip  . Foot drop, left     Past Surgical History  Procedure Laterality Date  .  Tubal ligation    . Cardiac catheterization      Ejection Fraction was around 45-50%  . Replacement total hip w/  resurfacing implants  2002    left  . Hysteroscopy  09/03/2011    Procedure: HYSTEROSCOPY;  Surgeon: Logan Bores, MD;  Location: [facility];  Service: Gynecology;  Laterality: N/A;  With Resection of polyp with truclear system     Current Outpatient Prescriptions  Medication Sig Dispense Refill  . aspirin 81 MG tablet Take 81 mg by mouth daily.     Marland Kitchen atorvastatin (LIPITOR) 40 MG tablet Take 40 mg by mouth daily.  1  . carvedilol (COREG) 12.5 MG tablet Take 1 tablet (12.5 mg total) by mouth 2 (two) times daily with a meal. 60 tablet 11  . FLUZONE HIGH-DOSE 0.5 ML SUSY Inject as directed once.  0  . furosemide (LASIX) 40 MG tablet Take 1 tablet (40 mg total) by mouth daily. 30 tablet 5  .  lisinopril (PRINIVIL,ZESTRIL) 10 MG tablet Take 1 tablet (10 mg total) by mouth daily. 30 tablet 6  . LORazepam (ATIVAN) 0.5 MG tablet Take 0.5 mg by mouth daily as needed. For anxiety    . Multiple Vitamin (MULTI-VITAMIN PO) Take 1 tablet by mouth daily.     . naproxen (NAPROSYN) 500 MG tablet Take 250 mg by mouth daily as needed. For pain    . nitroGLYCERIN (NITROSTAT) 0.4 MG SL tablet Place 1 tablet (0.4 mg total) under the tongue every 5 (five) minutes as needed for chest pain. 25 tablet 5  . Omega-3 Fatty Acids (FISH OIL) 1200 MG CAPS Take 1 capsule by mouth daily.    . pantoprazole (PROTONIX) 40 MG tablet Take 40 mg by mouth 2 (two) times daily.     Marland Kitchen venlafaxine XR (EFFEXOR-XR) 75 MG 24 hr capsule Take 75 mg by mouth daily.  0  . Vitamin D, Ergocalciferol, (DRISDOL) 50000 UNITS CAPS Take by mouth every 30 (thirty) days.     No current facility-administered medications for this visit.    Allergies:   Nickel    Social History:  The patient  reports that she has never smoked. She does not have any smokeless tobacco history on file. She reports that she does not drink alcohol or use illicit drugs.   Family History:  The patient's family history includes Cancer in her sister; Stroke in her maternal grandfather and mother.    ROS:  Please see the history of present illness.    Review of Systems: Constitutional:  denies fever, chills, diaphoresis, appetite change and fatigue.  HEENT: denies photophobia, eye pain, redness, hearing loss, ear pain, congestion, sore throat, rhinorrhea, sneezing, neck pain, neck stiffness and tinnitus.  Respiratory: denies SOB, DOE, cough, chest tightness, and wheezing.  Cardiovascular: denies chest pain, palpitations and leg swelling.  Gastrointestinal: denies nausea, vomiting, abdominal pain, diarrhea, constipation, blood in stool.  Genitourinary: denies dysuria, urgency, frequency, hematuria, flank pain and difficulty urinating.  Musculoskeletal: denies   myalgias, back pain, joint swelling, arthralgias and gait problem.   Skin: denies pallor, rash and wound.  Neurological: denies dizziness, seizures, syncope, weakness, light-headedness, numbness and headaches.   Hematological: denies adenopathy, easy bruising, personal or family bleeding history.  Psychiatric/ Behavioral: denies suicidal ideation, mood changes, confusion, nervousness, sleep disturbance and agitation.       All other systems are reviewed and negative.    PHYSICAL EXAM: VS:  BP 104/80 mmHg  Pulse 73  Ht 5' 6.5" (1.689 m)  Wt 211 lb 6.4 oz (95.89 kg)  BMI 33.61 kg/m2  SpO2 97% , BMI Body mass index is 33.61 kg/(m^2). GEN: Well nourished, well developed, in no acute distress HEENT: normal Neck: no JVD, carotid bruits, or masses Cardiac: RRR; no murmurs, rubs, or gallops,no edema  Respiratory:  clear to auscultation bilaterally, normal work of breathing GI: soft, nontender, nondistended, + BS MS: no deformity or atrophy Skin: warm and dry, no rash Neuro:  Strength and sensation are intact Psych: normal   EKG:  EKG is ordered today. The ekg ordered today demonstrates :  NSR at 71.  NS ST abn.    Recent Labs: 04/23/2015: ALT 14; BUN 21; Creat 0.78; Hemoglobin 14.6; Platelets 205; Potassium 4.8; Sodium 139    Lipid Panel    Component Value Date/Time   CHOL 142 04/23/2015 1100   TRIG 112 04/23/2015 1100   HDL 47 04/23/2015 1100   CHOLHDL 3.0 04/23/2015 1100   VLDL 22 04/23/2015 1100   LDLCALC 73 04/23/2015 1100      Wt Readings from Last 3 Encounters:  04/30/15 211 lb 6.4 oz (95.89 kg)  10/30/14 224 lb (101.606 kg)  10/20/14 229 lb 6 oz (104.044 kg)      Other studies Reviewed: Additional studies/ records that were reviewed today include: . Review of the above records demonstrates:    ASSESSMENT AND PLAN:  1. Chronic systolic CHF- LV EF has improved dramatically - no symptoms at this point , cont current meds She exercises fairly  regularly.   2. Hyperlipidemia - labs look good.  Will check labs again when I see her in 6 months.   3. Left hip replacement   Current medicines are reviewed at length with the patient today.  The patient does not have concerns regarding medicines.  The following changes have been made:  no change  Labs/ tests ordered today include:  No orders of the defined types were placed in this encounter.    Disposition:   FU with me in 6 months      , Wonda Cheng, MD  04/30/2015 10:13 AM    [facility] [place], [place], [place]  [address] Phone: [phone]; Fax: [phone]   [facility]  [address] [place], [place]  [address] [phone]    Fax [phone]

## 2015-05-14 DIAGNOSIS — F329 Major depressive disorder, single episode, unspecified: Secondary | ICD-10-CM | POA: Diagnosis not present

## 2015-05-29 DIAGNOSIS — F329 Major depressive disorder, single episode, unspecified: Secondary | ICD-10-CM | POA: Diagnosis not present

## 2015-06-04 ENCOUNTER — Encounter (HOSPITAL_BASED_OUTPATIENT_CLINIC_OR_DEPARTMENT_OTHER): Payer: Medicare Other | Attending: General Surgery

## 2015-06-04 DIAGNOSIS — E669 Obesity, unspecified: Secondary | ICD-10-CM | POA: Diagnosis not present

## 2015-06-04 DIAGNOSIS — M1990 Unspecified osteoarthritis, unspecified site: Secondary | ICD-10-CM | POA: Insufficient documentation

## 2015-06-04 DIAGNOSIS — M21372 Foot drop, left foot: Secondary | ICD-10-CM | POA: Insufficient documentation

## 2015-06-04 DIAGNOSIS — G629 Polyneuropathy, unspecified: Secondary | ICD-10-CM | POA: Insufficient documentation

## 2015-06-04 DIAGNOSIS — Z96642 Presence of left artificial hip joint: Secondary | ICD-10-CM | POA: Insufficient documentation

## 2015-06-04 DIAGNOSIS — I509 Heart failure, unspecified: Secondary | ICD-10-CM | POA: Insufficient documentation

## 2015-06-04 DIAGNOSIS — Z6832 Body mass index (BMI) 32.0-32.9, adult: Secondary | ICD-10-CM | POA: Insufficient documentation

## 2015-06-04 DIAGNOSIS — I2510 Atherosclerotic heart disease of native coronary artery without angina pectoris: Secondary | ICD-10-CM | POA: Diagnosis not present

## 2015-06-04 DIAGNOSIS — L97521 Non-pressure chronic ulcer of other part of left foot limited to breakdown of skin: Secondary | ICD-10-CM | POA: Diagnosis not present

## 2015-06-04 DIAGNOSIS — I252 Old myocardial infarction: Secondary | ICD-10-CM | POA: Diagnosis not present

## 2015-06-04 DIAGNOSIS — I110 Hypertensive heart disease with heart failure: Secondary | ICD-10-CM | POA: Insufficient documentation

## 2015-06-11 DIAGNOSIS — M21372 Foot drop, left foot: Secondary | ICD-10-CM | POA: Diagnosis not present

## 2015-06-11 DIAGNOSIS — I509 Heart failure, unspecified: Secondary | ICD-10-CM | POA: Diagnosis not present

## 2015-06-11 DIAGNOSIS — L97521 Non-pressure chronic ulcer of other part of left foot limited to breakdown of skin: Secondary | ICD-10-CM | POA: Diagnosis not present

## 2015-06-11 DIAGNOSIS — I2510 Atherosclerotic heart disease of native coronary artery without angina pectoris: Secondary | ICD-10-CM | POA: Diagnosis not present

## 2015-06-11 DIAGNOSIS — I252 Old myocardial infarction: Secondary | ICD-10-CM | POA: Diagnosis not present

## 2015-06-11 DIAGNOSIS — I110 Hypertensive heart disease with heart failure: Secondary | ICD-10-CM | POA: Diagnosis not present

## 2015-06-12 DIAGNOSIS — F329 Major depressive disorder, single episode, unspecified: Secondary | ICD-10-CM | POA: Diagnosis not present

## 2015-06-18 DIAGNOSIS — M21372 Foot drop, left foot: Secondary | ICD-10-CM | POA: Diagnosis not present

## 2015-06-18 DIAGNOSIS — L97521 Non-pressure chronic ulcer of other part of left foot limited to breakdown of skin: Secondary | ICD-10-CM | POA: Diagnosis not present

## 2015-06-18 DIAGNOSIS — I252 Old myocardial infarction: Secondary | ICD-10-CM | POA: Diagnosis not present

## 2015-06-18 DIAGNOSIS — I110 Hypertensive heart disease with heart failure: Secondary | ICD-10-CM | POA: Diagnosis not present

## 2015-06-18 DIAGNOSIS — I2510 Atherosclerotic heart disease of native coronary artery without angina pectoris: Secondary | ICD-10-CM | POA: Diagnosis not present

## 2015-06-18 DIAGNOSIS — I509 Heart failure, unspecified: Secondary | ICD-10-CM | POA: Diagnosis not present

## 2015-06-25 ENCOUNTER — Encounter (HOSPITAL_BASED_OUTPATIENT_CLINIC_OR_DEPARTMENT_OTHER): Payer: Medicare Other | Attending: Internal Medicine

## 2015-06-25 DIAGNOSIS — I509 Heart failure, unspecified: Secondary | ICD-10-CM | POA: Insufficient documentation

## 2015-06-25 DIAGNOSIS — I2510 Atherosclerotic heart disease of native coronary artery without angina pectoris: Secondary | ICD-10-CM | POA: Insufficient documentation

## 2015-06-25 DIAGNOSIS — I110 Hypertensive heart disease with heart failure: Secondary | ICD-10-CM | POA: Insufficient documentation

## 2015-06-25 DIAGNOSIS — L97521 Non-pressure chronic ulcer of other part of left foot limited to breakdown of skin: Secondary | ICD-10-CM | POA: Insufficient documentation

## 2015-06-25 DIAGNOSIS — G609 Hereditary and idiopathic neuropathy, unspecified: Secondary | ICD-10-CM | POA: Diagnosis not present

## 2015-06-25 DIAGNOSIS — M21379 Foot drop, unspecified foot: Secondary | ICD-10-CM | POA: Diagnosis not present

## 2015-06-25 DIAGNOSIS — M1990 Unspecified osteoarthritis, unspecified site: Secondary | ICD-10-CM | POA: Diagnosis not present

## 2015-06-25 DIAGNOSIS — G629 Polyneuropathy, unspecified: Secondary | ICD-10-CM | POA: Diagnosis not present

## 2015-06-25 DIAGNOSIS — I252 Old myocardial infarction: Secondary | ICD-10-CM | POA: Diagnosis not present

## 2015-06-26 DIAGNOSIS — F329 Major depressive disorder, single episode, unspecified: Secondary | ICD-10-CM | POA: Diagnosis not present

## 2015-06-26 DIAGNOSIS — F429 Obsessive-compulsive disorder, unspecified: Secondary | ICD-10-CM | POA: Diagnosis not present

## 2015-06-27 DIAGNOSIS — F329 Major depressive disorder, single episode, unspecified: Secondary | ICD-10-CM | POA: Diagnosis not present

## 2015-07-02 DIAGNOSIS — I509 Heart failure, unspecified: Secondary | ICD-10-CM | POA: Diagnosis not present

## 2015-07-02 DIAGNOSIS — G629 Polyneuropathy, unspecified: Secondary | ICD-10-CM | POA: Diagnosis not present

## 2015-07-02 DIAGNOSIS — G602 Neuropathy in association with hereditary ataxia: Secondary | ICD-10-CM | POA: Diagnosis not present

## 2015-07-02 DIAGNOSIS — L84 Corns and callosities: Secondary | ICD-10-CM | POA: Diagnosis not present

## 2015-07-02 DIAGNOSIS — I2510 Atherosclerotic heart disease of native coronary artery without angina pectoris: Secondary | ICD-10-CM | POA: Diagnosis not present

## 2015-07-02 DIAGNOSIS — M21372 Foot drop, left foot: Secondary | ICD-10-CM | POA: Diagnosis not present

## 2015-07-02 DIAGNOSIS — M1990 Unspecified osteoarthritis, unspecified site: Secondary | ICD-10-CM | POA: Diagnosis not present

## 2015-07-02 DIAGNOSIS — L97521 Non-pressure chronic ulcer of other part of left foot limited to breakdown of skin: Secondary | ICD-10-CM | POA: Diagnosis not present

## 2015-07-02 DIAGNOSIS — I110 Hypertensive heart disease with heart failure: Secondary | ICD-10-CM | POA: Diagnosis not present

## 2015-07-10 DIAGNOSIS — F329 Major depressive disorder, single episode, unspecified: Secondary | ICD-10-CM | POA: Diagnosis not present

## 2015-07-11 DIAGNOSIS — T1512XA Foreign body in conjunctival sac, left eye, initial encounter: Secondary | ICD-10-CM | POA: Diagnosis not present

## 2015-07-11 DIAGNOSIS — H04123 Dry eye syndrome of bilateral lacrimal glands: Secondary | ICD-10-CM | POA: Diagnosis not present

## 2015-07-15 DIAGNOSIS — H2513 Age-related nuclear cataract, bilateral: Secondary | ICD-10-CM | POA: Diagnosis not present

## 2015-07-15 DIAGNOSIS — H40012 Open angle with borderline findings, low risk, left eye: Secondary | ICD-10-CM | POA: Diagnosis not present

## 2015-07-15 DIAGNOSIS — H5213 Myopia, bilateral: Secondary | ICD-10-CM | POA: Diagnosis not present

## 2015-07-24 DIAGNOSIS — F329 Major depressive disorder, single episode, unspecified: Secondary | ICD-10-CM | POA: Diagnosis not present

## 2015-07-24 DIAGNOSIS — F429 Obsessive-compulsive disorder, unspecified: Secondary | ICD-10-CM | POA: Diagnosis not present

## 2015-08-07 DIAGNOSIS — F429 Obsessive-compulsive disorder, unspecified: Secondary | ICD-10-CM | POA: Diagnosis not present

## 2015-08-07 DIAGNOSIS — F339 Major depressive disorder, recurrent, unspecified: Secondary | ICD-10-CM | POA: Diagnosis not present

## 2015-08-14 DIAGNOSIS — H40011 Open angle with borderline findings, low risk, right eye: Secondary | ICD-10-CM | POA: Diagnosis not present

## 2015-08-14 DIAGNOSIS — H40012 Open angle with borderline findings, low risk, left eye: Secondary | ICD-10-CM | POA: Diagnosis not present

## 2015-08-21 ENCOUNTER — Emergency Department (HOSPITAL_COMMUNITY): Payer: Medicare Other

## 2015-08-21 ENCOUNTER — Encounter (HOSPITAL_COMMUNITY): Payer: Self-pay | Admitting: *Deleted

## 2015-08-21 ENCOUNTER — Emergency Department (INDEPENDENT_AMBULATORY_CARE_PROVIDER_SITE_OTHER)
Admission: EM | Admit: 2015-08-21 | Discharge: 2015-08-21 | Disposition: A | Discharge status: Nursing Home (Includes ICF) | Payer: Medicare Other | Source: Home / Self Care | Attending: Family Medicine | Admitting: Family Medicine

## 2015-08-21 ENCOUNTER — Emergency Department (HOSPITAL_COMMUNITY)
Admission: EM | Admit: 2015-08-21 | Discharge: 2015-08-22 | Disposition: A | Discharge status: Home / Self Care | Payer: Medicare Other | Attending: Emergency Medicine | Admitting: Emergency Medicine

## 2015-08-21 ENCOUNTER — Encounter (HOSPITAL_COMMUNITY): Payer: Self-pay | Admitting: Emergency Medicine

## 2015-08-21 DIAGNOSIS — Z9889 Other specified postprocedural states: Secondary | ICD-10-CM | POA: Diagnosis not present

## 2015-08-21 DIAGNOSIS — I10 Essential (primary) hypertension: Secondary | ICD-10-CM | POA: Insufficient documentation

## 2015-08-21 DIAGNOSIS — E785 Hyperlipidemia, unspecified: Secondary | ICD-10-CM | POA: Diagnosis not present

## 2015-08-21 DIAGNOSIS — F329 Major depressive disorder, single episode, unspecified: Secondary | ICD-10-CM | POA: Insufficient documentation

## 2015-08-21 DIAGNOSIS — Y9289 Other specified places as the place of occurrence of the external cause: Secondary | ICD-10-CM | POA: Insufficient documentation

## 2015-08-21 DIAGNOSIS — Z7982 Long term (current) use of aspirin: Secondary | ICD-10-CM | POA: Diagnosis not present

## 2015-08-21 DIAGNOSIS — Z8669 Personal history of other diseases of the nervous system and sense organs: Secondary | ICD-10-CM | POA: Diagnosis not present

## 2015-08-21 DIAGNOSIS — I509 Heart failure, unspecified: Secondary | ICD-10-CM | POA: Insufficient documentation

## 2015-08-21 DIAGNOSIS — Z0389 Encounter for observation for other suspected diseases and conditions ruled out: Secondary | ICD-10-CM | POA: Diagnosis not present

## 2015-08-21 DIAGNOSIS — T17900A Unspecified foreign body in respiratory tract, part unspecified causing asphyxiation, initial encounter: Secondary | ICD-10-CM | POA: Insufficient documentation

## 2015-08-21 DIAGNOSIS — F429 Obsessive-compulsive disorder, unspecified: Secondary | ICD-10-CM | POA: Diagnosis not present

## 2015-08-21 DIAGNOSIS — Y999 Unspecified external cause status: Secondary | ICD-10-CM | POA: Diagnosis not present

## 2015-08-21 DIAGNOSIS — M1612 Unilateral primary osteoarthritis, left hip: Secondary | ICD-10-CM | POA: Diagnosis not present

## 2015-08-21 DIAGNOSIS — I252 Old myocardial infarction: Secondary | ICD-10-CM | POA: Diagnosis not present

## 2015-08-21 DIAGNOSIS — R0901 Asphyxia: Secondary | ICD-10-CM | POA: Diagnosis not present

## 2015-08-21 DIAGNOSIS — T17908A Unspecified foreign body in respiratory tract, part unspecified causing other injury, initial encounter: Secondary | ICD-10-CM

## 2015-08-21 DIAGNOSIS — X58XXXA Exposure to other specified factors, initial encounter: Secondary | ICD-10-CM | POA: Insufficient documentation

## 2015-08-21 DIAGNOSIS — I2510 Atherosclerotic heart disease of native coronary artery without angina pectoris: Secondary | ICD-10-CM | POA: Diagnosis not present

## 2015-08-21 DIAGNOSIS — Y9389 Activity, other specified: Secondary | ICD-10-CM | POA: Diagnosis not present

## 2015-08-21 DIAGNOSIS — E669 Obesity, unspecified: Secondary | ICD-10-CM | POA: Insufficient documentation

## 2015-08-21 DIAGNOSIS — K219 Gastro-esophageal reflux disease without esophagitis: Secondary | ICD-10-CM | POA: Insufficient documentation

## 2015-08-21 DIAGNOSIS — Z79899 Other long term (current) drug therapy: Secondary | ICD-10-CM | POA: Insufficient documentation

## 2015-08-21 DIAGNOSIS — F419 Anxiety disorder, unspecified: Secondary | ICD-10-CM | POA: Diagnosis not present

## 2015-08-21 NOTE — ED Notes (Signed)
Pt states she was using her teeth to rip pink packing tape.  When she inhaled she felt something go to the back of her throat and when she looked at the tape, there was a nickel sized piece of tape missing.  She drank 32 oz of water to see if she could flush it down.  She said it still felt like it was there so she induced vomiting and still did not see the piece of tape.  Pt is unsure if she can still feel the piece of tape at this time.  She has been chewing gum to produce saliva to help wash it down.

## 2015-08-21 NOTE — ED Provider Notes (Signed)
CSN: [id number]     Arrival date & time 08/21/15  1851 History   First MD Initiated Contact with Patient 08/21/15 2006     Chief Complaint  Patient presents with  . Swallowed Foreign Body   (Consider location/radiation/quality/duration/timing/severity/associated sxs/prior Treatment) HPI Comments: 75 year old female was using packing tape and try to cut it with her teeth and in the process inhaled a fragment of the take and the patient states she is sure it was stuck in the back of her throat for a period of time. After which she drank several ounces of water and then tried to self induce vomiting. After vomiting she did not see the foreign body. She believes it may be in her airway. She is not coughing. She is not short of breath no gagging and no stridor. She wants to be sure that there is no foreign body and injuring her airway. She is currently completely stable. Voice is normal. No stridor and lungs are clear.  Patient is a 75 y.o. female presenting with foreign body swallowed.  Swallowed Foreign Body Pertinent negatives include no chest pain and no shortness of breath.    Past Medical History  Diagnosis Date  . Coronary artery disease   . CHF (congestive heart failure) (HCC)     Dilated cardiomyopathy. Her original ejection fraction was 10-20%. Her ejection fraction has improved to 57% by 2009  . Hypertension   . Dyslipidemia   . Chest pain   . Myocardial infarction ([place])   . Depression   . Obesity   . Headache(784.0)   . Carpal tunnel syndrome     Left Hand  . GERD (gastroesophageal reflux disease)   . Lower back pain   . Hemorrhoids   . History of anxiety   . Osteoarthritis     Severe with collapse of the left hip  . Foot drop, left    Past Surgical History  Procedure Laterality Date  . Tubal ligation    . Cardiac catheterization      Ejection Fraction was around 45-50%  . Replacement total hip w/  resurfacing implants  2002    left  . Hysteroscopy  09/03/2011   Procedure: HYSTEROSCOPY;  Surgeon: Logan Bores, MD;  Location: [facility];  Service: Gynecology;  Laterality: N/A;  With Resection of polyp with truclear system   Family History  Problem Relation Age of Onset  . Stroke Mother   . Cancer Sister   . Stroke Maternal Grandfather    Social History  Substance Use Topics  . Smoking status: Never Smoker   . Smokeless tobacco: None  . Alcohol Use: No   OB History    No data available     Review of Systems  Constitutional: Negative.   HENT: Negative for congestion, ear pain, postnasal drip, rhinorrhea and trouble swallowing.   Respiratory: Negative for apnea, cough, choking, chest tightness, shortness of breath, wheezing and stridor.   Cardiovascular: Negative for chest pain.  Gastrointestinal: Negative.   All other systems reviewed and are negative.   Allergies  Nickel  Home Medications   Prior to Admission medications   Medication Sig Start Date End Date Taking? Authorizing Provider  aspirin 81 MG tablet Take 81 mg by mouth daily.    Yes Historical Provider, MD  atorvastatin (LIPITOR) 40 MG tablet Take 40 mg by mouth daily. 10/29/14  Yes Historical Provider, MD  carvedilol (COREG) 12.5 MG tablet Take 1 tablet (12.5 mg total) by mouth 2 (two) times daily with  a meal. 09/19/14  Yes Thayer Headings, MD  furosemide (LASIX) 40 MG tablet Take 1 tablet (40 mg total) by mouth daily. 03/01/15  Yes Thayer Headings, MD  lisinopril (PRINIVIL,ZESTRIL) 10 MG tablet Take 1 tablet (10 mg total) by mouth daily. 01/30/15  Yes Thayer Headings, MD  LORazepam (ATIVAN) 0.5 MG tablet Take 0.5 mg by mouth daily as needed. For anxiety   Yes Historical Provider, MD  pantoprazole (PROTONIX) 40 MG tablet Take 40 mg by mouth 2 (two) times daily.    Yes Historical Provider, MD  venlafaxine XR (EFFEXOR-XR) 75 MG 24 hr capsule Take 75 mg by mouth daily. 04/03/14  Yes Historical Provider, MD  FLUZONE HIGH-DOSE 0.5 ML SUSY Inject as directed once. 03/25/15   Historical  Provider, MD  Multiple Vitamin (MULTI-VITAMIN PO) Take 1 tablet by mouth daily.     Historical Provider, MD  naproxen (NAPROSYN) 500 MG tablet Take 250 mg by mouth daily as needed. For pain    Historical Provider, MD  nitroGLYCERIN (NITROSTAT) 0.4 MG SL tablet Place 1 tablet (0.4 mg total) under the tongue every 5 (five) minutes as needed for chest pain. 08/31/14   Thayer Headings, MD  Omega-3 Fatty Acids (FISH OIL) 1200 MG CAPS Take 1 capsule by mouth daily.    Historical Provider, MD  Vitamin D, Ergocalciferol, (DRISDOL) 50000 UNITS CAPS Take by mouth every 30 (thirty) days. 03/24/12   Historical Provider, MD   Meds Ordered and Administered this Visit  Medications - No data to display  BP 139/99 mmHg  Pulse 68  Temp(Src) 98 F (36.7 C) (Oral)  Resp 18  SpO2 97% No data found.   Physical Exam  Constitutional: She is oriented to person, place, and time. She appears well-developed and well-nourished. No distress.  Eyes: EOM are normal.  Neck: Normal range of motion. Neck supple.  Cardiovascular: Normal rate.   Pulmonary/Chest: Effort normal and breath sounds normal. No respiratory distress. She has no wheezes. She has no rales.  No abnormal breath sounds from the throat or lungs. No stridor. No wheezing. Good air movement. Good chest expansion. Voice is normal.  Musculoskeletal: She exhibits no edema.  Neurological: She is alert and oriented to person, place, and time. She exhibits normal muscle tone.  Skin: Skin is warm and dry.  Psychiatric: She has a normal mood and affect.  Nursing note and vitals reviewed.   ED Course  Procedures (including critical care time)  Labs Review Labs Reviewed - No data to display  Imaging Review No results found.   Visual Acuity Review  Right Eye Distance:   Left Eye Distance:   Bilateral Distance:    Right Eye Near:   Left Eye Near:    Bilateral Near:         MDM   1. Foreign body aspiration, initial encounter    Possibility  of fragment of take an inhaled into either the esophagus or airway. Patient is concerned about potential airway obstruction. Patient is stable and is exhibiting no current symptoms but is certain the foreign body is present in her throat. Patient is being transferred to the emergency department for potential visualization of possible foreign body in the esophagus or airway. Completely stable. Airway is wide open. Breathing is normal. Lungs are clear.    Janne Napoleon, NP 08/21/15 2027

## 2015-08-21 NOTE — ED Notes (Signed)
Pt being transferred to ED by POV for further evaluation for swallowing a foreign object.  Report was taken by Tawni Levy RN in the ED.  Pt is A&O w/ VSS and is in no apparent distress.

## 2015-08-21 NOTE — Discharge Instructions (Signed)
Follow-up with [facility] pulmonology if you develop cough or fever, and return to the ER if you develop chest pain or difficulty breathing.   Swallowed Foreign Body, Adult A swallowed foreign body is an object that gets stuck in the tube that connects your throat to your stomach (esophagus) or in another part of your digestive tract. Foreign bodies may be swallowed by accident or on purpose. When you swallow an object, it passes into your esophagus. The narrowest place in your digestive system is where your esophagus meets your stomach. If the object can pass through that place, it will usually continue through the rest of your digestive system without causing problems. A foreign body that gets stuck may need to be removed. It is very important to tell your health care provider what you have swallowed. Certain swallowed items can be life-threatening. You may need emergency treatment. Dangerous swallowed foreign bodies include:  Objects that get stuck in your throat.  Objects that interfere with your breathing.  Sharp objects.  Harmful objects, such as batteries or illegal drugs. CAUSES The most common swallowed foreign body is food that will not pass through your esophagus to your stomach (food impaction). Foods that commonly become impacted include meats and hard vegetables, such as carrots and radishes. Other common swallowed foreign bodies include:  Pieces of bone from meats.  Toothpicks.  Dentures. RISK FACTORS You are more likely to have a swallowed foreign body if:  You wear dentures.  You have been drinking alcohol or taking drugs.  You have a mental health condition.  You have a narrowed or scarred area in your digestive tract. SYMPTOMS  Pain or pressure in your throat or chest.  Not being able to swallow food or liquid.  Not being able to swallow your saliva.  Choking.  Hoarse voice.  Trouble breathing. DIAGNOSIS This condition may be diagnosed based on your  symptoms and medical history. Your health care provider will do a physical exam to confirm the diagnosis and to find the object. Imaging studies may be done, including:  X-rays.  A CT scan. Some objects may not be seen on imaging studies. In those cases, an exam may be done using a long tubelike scope to look into your esophagus (endoscopy). The tube (endoscope) that is used for this exam may be stiff (rigid) or flexible, depending on where the foreign body is stuck. TREATMENT Usually, an object that has passed into your stomach but is not dangerous will pass out of your digestive system without treatment. If the swallowed object is not dangerous but it is stuck in your esophagus:  You may be given medicine to relax the muscles of your esophagus to allow the object to pass through.  Endoscopy may be done to find and remove the object if it does not pass with medicine. Your health care provider will put medical instruments through the endoscope to remove the object. You may need emergency treatment if:  The object is in your esophagus and is causing you to inhale saliva into your lungs (aspirate).  The object is in your esophagus and is pressing on your airway. This makes it hard for you to breathe.  The object can damage your digestive tract. Some objects that can cause damage include batteries, magnets, sharp objects, and drugs. HOME CARE INSTRUCTIONS If the object in your digestive system is expected to pass:  Continue eating what you usually eat, unless your health care provider gives you different instructions.  Check your stool after  every bowel movement to see if you have passed the object.  Contact your health care provider if the object has not passed after 3 days. If you had endoscopic surgery to remove the foreign body:  Follow instructions from your health care provider about caring for yourself after the procedure. Keep all follow-up visits and repeat imaging tests as told  by your health care provider. This is important. SEEK MEDICAL CARE IF:  You continue to have symptoms of a swallowed foreign body.  The object has not passed out of your body after 3 days. SEEK IMMEDIATE MEDICAL CARE IF:  You have a fever.  You have pain in your chest or your abdomen.  You cough up blood.  You have blood in your stool (feces) or your vomit.   This information is not intended to replace advice given to you by your health care provider. Make sure you discuss any questions you have with your health care provider.   Document Released: 11/26/2009 Document Revised: 02/27/2015 Document Reviewed: 09/05/2014 Elsevier Interactive Patient Education 2016 Else[employer]  Aspiration Precautions Aspiration is the breathing in (inhalation) of a liquid or object into the lungs. Things that can be inhaled into the lungs include:   Food.  Any type of liquid, such as drinks or saliva.  Stomach contents, such as vomit or stomach acid. When these things go into the lungs, damage can occur and serious complications can result, such as:  Lung infection (pneumonia).  Collection of infected liquid (pus) in the lungs (lung abscess).  Death. CAUSES The cause of aspiration may include:   A lowered level of awareness (consciousness) due to:  Traumatic brain injury or head injury.  Stroke.  Diseases of the nerves, brain, or spinal cord.  Seizures.  A problem with the gag reflex. The gag reflex protects the body from swallowing things too quickly or things that are too large.  Medical conditions that affect swallowing.  Conditions that affect the food pipe (esophagus).  Acid reflux. This is when stomach acid moves into the esophagus.  Any type of surgery where a medicine to sleep (general anesthetic)or relax (sedative) is given.  Alcohol abuse.  Illegal drug abuse.  Taking medicine that causes sleepiness, confusion, or weakness.  Aging.  Dental problems.  Having  a feeding tube. SIGNS AND SYMPTOMS Symptoms of aspiration may include:   Coughing after swallowing food or liquids.  Difficulty breathing. This may include:  Breathing quickly.  Breathing very slowly.  Loud breathing.  Rumbling sounds from the lungs while breathing.  Coughing up phlegm (sputum) that:  Is yellow, tan, or green.  Has pieces of food in it.  Is bad smelling.  A change in voice so that it sounds scratchy.  A change in skin color. The skin may look red or blue.   Fever.  Watery eyes.  Pain in the chest or back.  A pained look on the face.   A feeling of fullness in the throat or that something is stuck in the throat. DIAGNOSIS Aspiration may be diagnosed by:   Chest X-ray.  Bronchoscopy. This is a surgical procedure in which a thin, flexible tube with a camera is inserted into the nose or mouth to the lungs. The health care provider can then view the lungs.  A swallowing evaluation study to find out:  A person's risk of aspiration.  How difficult it is for a person to swallow.  What types of foods are safe for a person to eat. PREVENTION  If you are caring for someone who can eat and drink through his or her mouth:   Have the person sit in an upright position when eating food or drinking fluids, such as:  Sitting up in a chair.  If sitting in a chair is not possible, position the person in bed so he or she is upright.  Remind the person to eat slowly and chew well.  Do not distract the person. This is especially important for people with thinking or memory (cognitive) problems.  Check the person's mouth for leftover food after eating.  Keep the person sitting upright for 30-45 minutes after eating.  Do not serve food or drink for at least 2 hours before bedtime. If you are caring for someone with a feeding tube who cannot eat or drink through his or her mouth:  Keep the person in an upright position as much as possible.  Do not   lay the person flat if he or she is getting continuous feedings. Turn the feeding pump off if you need to lay the person flat for any reason.  Check feeding tube residuals as directed by your health care provider. Ask your health care provider what residual amount is too high. General guidelines to prevent aspiration in someone you are caring for include:  Feed small amounts of food. Do not force feed.  Food should be thickened as directed by the person's speech pathologist.  Use as little water as possible when brushing the person's teeth or cleaning his or her mouth.  Provide oral care before and after meals.  Never put food or liquids in the mouth of a person who is not fully alert.  Crush pills and put them in soft food such as pudding or ice cream. Some pills should not be crushed. Check with your health care provider before crushing any medicine. SEEK MEDICAL CARE IF:  The person has a feeding tube and the feeding tube residual amount is too high.  The person has a fever.  The person tries to avoid food, such as refusing to eat or be fed, or is eating less than normal. SEEK IMMEDIATE MEDICAL CARE IF:   The person has trouble breathing or starts to breathe quickly.  The person is breathing very slowly or stops breathing.  The person coughs a lot after eating or drinking.  The person has a long-lasting (chronic) cough.  The person coughs up thick, yellow, or tan sputum. MAKE SURE YOU:   Understand these instructions.  Will watch the person's condition.  Will get help right away if the person is not doing well or gets worse.   This information is not intended to replace advice given to you by your health care provider. Make sure you discuss any questions you have with your health care provider.   Document Released: 07/11/2010 Document Revised: 06/29/2014 Document Reviewed: 09/13/2013 Elsevier Interactive Patient Education [employer].

## 2015-08-21 NOTE — ED Provider Notes (Signed)
CSN: [id number]     Arrival date & time 08/21/15  2048 History  By signing my name below, I, Altamease Oiler, attest that this documentation has been prepared under the direction and in the presence of Veryl Speak, MD. Electronically Signed: Altamease Oiler, ED Scribe. 08/21/2015. 11:50 PM   Chief Complaint  Patient presents with  . Aspiration    The history is provided by the patient. No language interpreter was used.   Kristin Ina, PhD is a 75 y.o. female who presents to the Emergency Department for evaluation after swallowing a nickel-sized piece of packing tape this afternoon. Pt states that she was cutting the tape with her teeth and inhaled. She swallowed the tape and is concerned because she could feel it in her windpipe. After swallowing the tape, she drank 32 ounces of water and then self induced vomiting but was unable to visualize the tape. At present she feels in her usual state of health and denies coughing or SOB. She was evaluated at Urgent Care and referred to the ED for imaging.   Past Medical History  Diagnosis Date  . Coronary artery disease   . CHF (congestive heart failure) (HCC)     Dilated cardiomyopathy. Her original ejection fraction was 10-20%. Her ejection fraction has improved to 57% by 2009  . Hypertension   . Dyslipidemia   . Chest pain   . Myocardial infarction ([place])   . Depression   . Obesity   . Headache(784.0)   . Carpal tunnel syndrome     Left Hand  . GERD (gastroesophageal reflux disease)   . Lower back pain   . Hemorrhoids   . History of anxiety   . Osteoarthritis     Severe with collapse of the left hip  . Foot drop, left    Past Surgical History  Procedure Laterality Date  . Tubal ligation    . Cardiac catheterization      Ejection Fraction was around 45-50%  . Replacement total hip w/  resurfacing implants  2002    left  . Hysteroscopy  09/03/2011    Procedure: HYSTEROSCOPY;  Surgeon: Logan Bores, MD;  Location: [facility];  Service: Gynecology;  Laterality: N/A;  With Resection of polyp with truclear system   Family History  Problem Relation Age of Onset  . Stroke Mother   . Cancer Sister   . Stroke Maternal Grandfather    Social History  Substance Use Topics  . Smoking status: Never Smoker   . Smokeless tobacco: None  . Alcohol Use: No   OB History    No data available     Review of Systems  10 Systems reviewed and all are negative for acute change except as noted in the HPI.  Allergies  Nickel  Home Medications   Prior to Admission medications   Medication Sig Start Date End Date Taking? Authorizing Provider  aspirin 81 MG tablet Take 81 mg by mouth daily.    Yes Historical Provider, MD  atorvastatin (LIPITOR) 40 MG tablet Take 40 mg by mouth daily. 10/29/14  Yes Historical Provider, MD  calcium carbonate (OS-CAL - DOSED IN MG OF ELEMENTAL CALCIUM) 1250 (500 Ca) MG tablet Take 1 tablet by mouth daily with breakfast.   Yes Historical Provider, MD  carvedilol (COREG) 12.5 MG tablet Take 1 tablet (12.5 mg total) by mouth 2 (two) times daily with a meal. 09/19/14  Yes Thayer Headings, MD  furosemide (LASIX) 40 MG tablet Take 1  tablet (40 mg total) by mouth daily. 03/01/15  Yes Thayer Headings, MD  lisinopril (PRINIVIL,ZESTRIL) 10 MG tablet Take 1 tablet (10 mg total) by mouth daily. 01/30/15  Yes Thayer Headings, MD  LORazepam (ATIVAN) 0.5 MG tablet Take 0.5 mg by mouth daily as needed for anxiety.    Yes Historical Provider, MD  Multiple Vitamin (MULTI-VITAMIN PO) Take 1 tablet by mouth daily.    Yes Historical Provider, MD  naproxen (NAPROSYN) 500 MG tablet Take 250 mg by mouth daily as needed for mild pain.    Yes Historical Provider, MD  nitroGLYCERIN (NITROSTAT) 0.4 MG SL tablet Place 1 tablet (0.4 mg total) under the tongue every 5 (five) minutes as needed for chest pain. 08/31/14  Yes Thayer Headings, MD  Omega-3 Fatty Acids (FISH OIL) 1200 MG CAPS Take 1 capsule by mouth daily.   Yes  Historical Provider, MD  pantoprazole (PROTONIX) 40 MG tablet Take 40 mg by mouth 2 (two) times daily.    Yes Historical Provider, MD  venlafaxine XR (EFFEXOR-XR) 75 MG 24 hr capsule Take 75 mg by mouth daily. 04/03/14  Yes Historical Provider, MD  Vitamin D, Ergocalciferol, (DRISDOL) 50000 UNITS CAPS Take 50,000 Units by mouth every 30 (thirty) days.  03/24/12  Yes Historical Provider, MD   BP 121/78 mmHg  Pulse 67  Temp(Src) 98.2 F (36.8 C) (Oral)  Resp 18  SpO2 98% Physical Exam  Constitutional: She is oriented to person, place, and time. She appears well-developed and well-nourished. No distress.  HENT:  Head: Normocephalic and atraumatic.  Mouth/Throat: Oropharynx is clear and moist.  Eyes: EOM are normal.  Neck: Normal range of motion.  Cardiovascular: Normal rate, regular rhythm and normal heart sounds.   Pulmonary/Chest: Effort normal and breath sounds normal.  CTAB  Abdominal: Soft. She exhibits no distension. There is no tenderness.  Musculoskeletal: Normal range of motion.  Neurological: She is alert and oriented to person, place, and time.  Skin: Skin is warm and dry.  Psychiatric: She has a normal mood and affect. Judgment normal.  Nursing note and vitals reviewed.   ED Course  Procedures (including critical care time) DIAGNOSTIC STUDIES: Oxygen Saturation is 98% on RA,  normal by my interpretation.    COORDINATION OF CARE: 11:44 PM Discussed treatment plan with pt at bedside and pt agreed to plan.  Labs Review Labs Reviewed - No data to display  Imaging Review Dg Neck Soft Tissue  08/21/2015  CLINICAL DATA:  possible aspiration of tape at 5:15 this eveningNo difficulty swallowing or breathing. No chest pain. EXAM: NECK SOFT TISSUES - 1+ VIEW COMPARISON:  None. FINDINGS: No evidence of a radiopaque foreign body. Soft tissues are unremarkable. Airway is widely patent. There are mild disc degenerative changes throughout the cervical spine. IMPRESSION: 1. No  evidence of a radiopaque foreign body.  No acute finding. Electronically Signed   By: Lajean Manes M.D.   On: 08/21/2015 21:36   Dg Chest 2 View  08/21/2015  CLINICAL DATA:  75 year old female with possible aspiration of a tape EXAM: CHEST  2 VIEW COMPARISON:  Chest radiograph dated 01/18/2012 and CT dated 01/19/2012 FINDINGS: Two views of the chest do not demonstrate a focal consolidation. There is no pleural effusion or pneumothorax. There is slight increase in the thoracic AP diameter likely related to underlying COPD. The central airways appear patent. No radiopaque foreign object identified. The cardiac silhouette is within normal limits. No acute osseous pathology. IMPRESSION: No active cardiopulmonary disease.  No radiopaque foreign object. Electronically Signed   By: Anner Crete M.D.   On: 08/21/2015 21:39   I have personally reviewed and evaluated these images as part of my medical decision-making.    MDM   Final diagnoses:  Foreign body aspiration, initial encounter    Patient presents with complaints of possible foreign body aspiration. A piece of tape which she was tearing with her teeth was inadvertently inhaled. For a short period of time she could feel this at the back of her throat. She reports making herself vomit and drinking several glasses of water. The sensation is now gone. She was evaluated at urgent care and sent here for further evaluation and possible scoping. Her x-rays reveal no foreign body and she appears quite comfortable and has no symptoms at present. At this point, I see no indication for any intervention, just expectant management. I believe that she likely either swallowed the small piece of tape or possibly vomited it up. She has no cough or pulmonary complaint that makes the think that this was aspirated. She understands to return if she develops fever, difficulty breathing, or cough.  I personally performed the services described in this documentation,  which was scribed in my presence. The recorded information has been reviewed and is accurate.       Veryl Speak, MD 08/22/15 [phone]

## 2015-08-21 NOTE — ED Notes (Signed)
Pt states she was cutting tape with her teeth and a piece of the tape flew off and she believes she inhaled it. States she induced vomiting and she did not see any tape. She has also drank water. She is concerned that it is in her airway.

## 2015-08-28 ENCOUNTER — Other Ambulatory Visit: Payer: Self-pay | Admitting: *Deleted

## 2015-08-28 MED ORDER — FUROSEMIDE 40 MG PO TABS: 40.0000 mg | ORAL_TABLET | Freq: Every day | ORAL | Status: DC

## 2015-09-04 DIAGNOSIS — F429 Obsessive-compulsive disorder, unspecified: Secondary | ICD-10-CM | POA: Diagnosis not present

## 2015-09-04 DIAGNOSIS — F329 Major depressive disorder, single episode, unspecified: Secondary | ICD-10-CM | POA: Diagnosis not present

## 2015-09-18 DIAGNOSIS — F429 Obsessive-compulsive disorder, unspecified: Secondary | ICD-10-CM | POA: Diagnosis not present

## 2015-09-18 DIAGNOSIS — F339 Major depressive disorder, recurrent, unspecified: Secondary | ICD-10-CM | POA: Diagnosis not present

## 2015-09-23 ENCOUNTER — Other Ambulatory Visit: Payer: Self-pay | Admitting: Cardiovascular Disease

## 2015-09-30 ENCOUNTER — Other Ambulatory Visit: Payer: Self-pay

## 2015-09-30 MED ORDER — NITROGLYCERIN 0.4 MG SL SUBL: 0.4000 mg | SUBLINGUAL_TABLET | SUBLINGUAL | Status: DC | PRN

## 2015-09-30 MED ORDER — LISINOPRIL 10 MG PO TABS: 10.0000 mg | ORAL_TABLET | Freq: Every day | ORAL | Status: DC

## 2015-10-02 ENCOUNTER — Other Ambulatory Visit: Payer: Self-pay | Admitting: *Deleted

## 2015-10-02 DIAGNOSIS — F429 Obsessive-compulsive disorder, unspecified: Secondary | ICD-10-CM | POA: Diagnosis not present

## 2015-10-02 DIAGNOSIS — F339 Major depressive disorder, recurrent, unspecified: Secondary | ICD-10-CM | POA: Diagnosis not present

## 2015-10-02 NOTE — Telephone Encounter (Signed)
[name], PhD  09/30/2015  Refill  MRN:  [id number]   Description: 75 year old female  Provider: Stephannie Peters, [place]  Department: [facility] [facility]       Call Documentation     No notes of this type exist for this encounter.     Encounter MyChart Messages     No messages in this encounter     Approved      Disp Refills Start End    nitroGLYCERIN (NITROSTAT) 0.4 MG SL tablet 25 tablet 5 09/30/2015     Sig - Route:  Place 1 tablet (0.4 mg total) under the tongue every 5 (five) minutes as needed for chest pain. - Sublingual    Class:  Normal    DAW:  No    Authorizing Provider:  Thayer Headings, MD    Ordering User:  Stephannie Peters, [place], [place] - [place] [address]   CHANGING PHARMACY AS REQUESTED TO [place]

## 2015-10-11 ENCOUNTER — Other Ambulatory Visit: Payer: Self-pay

## 2015-10-11 MED ORDER — LISINOPRIL 10 MG PO TABS: 10.0000 mg | ORAL_TABLET | Freq: Every day | ORAL | Status: DC

## 2015-10-16 DIAGNOSIS — F339 Major depressive disorder, recurrent, unspecified: Secondary | ICD-10-CM | POA: Diagnosis not present

## 2015-10-16 DIAGNOSIS — F429 Obsessive-compulsive disorder, unspecified: Secondary | ICD-10-CM | POA: Diagnosis not present

## 2015-10-18 ENCOUNTER — Other Ambulatory Visit: Payer: Medicare Other

## 2015-10-22 ENCOUNTER — Ambulatory Visit: Payer: Medicare Other | Admitting: Cardiovascular Disease

## 2015-10-22 DIAGNOSIS — H43813 Vitreous degeneration, bilateral: Secondary | ICD-10-CM | POA: Diagnosis not present

## 2015-10-30 DIAGNOSIS — F429 Obsessive-compulsive disorder, unspecified: Secondary | ICD-10-CM | POA: Diagnosis not present

## 2015-10-30 DIAGNOSIS — F339 Major depressive disorder, recurrent, unspecified: Secondary | ICD-10-CM | POA: Diagnosis not present

## 2015-11-11 ENCOUNTER — Other Ambulatory Visit (INDEPENDENT_AMBULATORY_CARE_PROVIDER_SITE_OTHER): Payer: Medicare Other

## 2015-11-11 DIAGNOSIS — E785 Hyperlipidemia, unspecified: Secondary | ICD-10-CM | POA: Diagnosis not present

## 2015-11-11 DIAGNOSIS — I5022 Chronic systolic (congestive) heart failure: Secondary | ICD-10-CM | POA: Diagnosis not present

## 2015-11-11 DIAGNOSIS — I509 Heart failure, unspecified: Secondary | ICD-10-CM | POA: Diagnosis not present

## 2015-11-11 LAB — LIPID PANEL
Cholesterol: 161 mg/dL (ref 125–200)
HDL: 52 mg/dL (ref >=46)
LDL Cholesterol: 88 mg/dL (ref <130)
Total CHOL/HDL Ratio: 3.1 Ratio (ref <=5.0)
Triglycerides: 104 mg/dL (ref <150)
VLDL: 21 mg/dL (ref <30)

## 2015-11-11 LAB — COMPREHENSIVE METABOLIC PANEL
ALT: 15 U/L (ref 6–29)
AST: 18 U/L (ref 10–35)
Albumin: 3.9 g/dL (ref 3.6–5.1)
Alkaline Phosphatase: 88 U/L (ref 33–130)
BUN: 25 mg/dL (ref 7–25)
CO2: 29 mmol/L (ref 20–31)
Calcium: 8.9 mg/dL (ref 8.6–10.4)
Chloride: 104 mmol/L (ref 98–110)
Creat: 0.8 mg/dL (ref 0.60–0.93)
Glucose, Bld: 100 mg/dL — ABNORMAL HIGH (ref 65–99)
Potassium: 4.2 mmol/L (ref 3.5–5.3)
Sodium: 139 mmol/L (ref 135–146)
Total Bilirubin: 0.6 mg/dL (ref 0.2–1.2)
Total Protein: 6.4 g/dL (ref 6.1–8.1)

## 2015-11-11 LAB — HEPATIC FUNCTION PANEL
ALT: 15 U/L (ref 6–29)
AST: 18 U/L (ref 10–35)
Albumin: 3.9 g/dL (ref 3.6–5.1)
Alkaline Phosphatase: 88 U/L (ref 33–130)
Bilirubin, Direct: 0.1 mg/dL (ref <=0.2)
Indirect Bilirubin: 0.5 mg/dL (ref 0.2–1.2)
Total Bilirubin: 0.6 mg/dL (ref 0.2–1.2)
Total Protein: 6.4 g/dL (ref 6.1–8.1)

## 2015-11-13 DIAGNOSIS — F339 Major depressive disorder, recurrent, unspecified: Secondary | ICD-10-CM | POA: Diagnosis not present

## 2015-11-13 DIAGNOSIS — F429 Obsessive-compulsive disorder, unspecified: Secondary | ICD-10-CM | POA: Diagnosis not present

## 2015-11-14 ENCOUNTER — Other Ambulatory Visit: Payer: Medicare Other

## 2015-11-22 ENCOUNTER — Ambulatory Visit (INDEPENDENT_AMBULATORY_CARE_PROVIDER_SITE_OTHER): Payer: Medicare Other | Admitting: Cardiovascular Disease

## 2015-11-22 ENCOUNTER — Encounter: Payer: Self-pay | Admitting: Cardiovascular Disease

## 2015-11-22 VITALS — BP 120/60 | HR 77 | Ht 66.5 in | Wt 224.4 lb

## 2015-11-22 DIAGNOSIS — E785 Hyperlipidemia, unspecified: Secondary | ICD-10-CM | POA: Diagnosis not present

## 2015-11-22 DIAGNOSIS — I5022 Chronic systolic (congestive) heart failure: Secondary | ICD-10-CM

## 2015-11-22 NOTE — Progress Notes (Signed)
Cardiology Office Note   Date:  11/22/2015   ID:  Kristin Ina, PhD, DOB [birth year], MRN [id number]  PCP:  Kristin Lopes, MD  Cardiologist:   Kristin Moores, MD   Chief Complaint  Patient presents with  . Follow-up   1. CHF- LV EF has improved dramatically 2. Hyperlipidemia 3. Left hip replacement  History of Present Illness:  75 year old female with history of congestive heart failure. Her original ejection fraction was 10-20%. Her ejection fraction has dramatically improved and is now 57% ( Echo 2009). She had an echocardiogram in February 2011 that revealed normal left ventricle systolic function.  She's had 2 heart catheterizations. Her last cardiac cath was in 2006 which revealed moderate irregularities.  She's not having any episodes of chest pain or shortness breath. She works out a regular basis at least 2-3 times a week.  She has gained 20 lbs over the past several months. She has a foot ulcer in her left foot and has not been in the pool.  She's not had any angina-like chest pain. She did have some stress-related chest discomfort around Christmastime but that has since resolved.  October 18, 2012:  Kristin Mack has done well for the past 6 months. She has not had any dyspnea. She has had lots of left hip pain and has not been able to exercise as much. She is swimming 4 times a week.  Nov. 24, 2014:  Kristin Mack is doing well. She has been having some acid reflux ( dry cough).  She has been trying eating smaller portions, earing earlier in the evening, avoiding spicy foods.   May 27,2015: Kristin Mack is doing well. No CP or dyspnea.  Stressed out over family issues. Her sons were living with her for several months. She has gone up on her Effexor.  She has had some left thigh problems.   Dec. 1, 2015:  Kristin Mack is seen today for follow up of her CHF. She has done well.  She has not been swimming as much recently - has an ulcer on the bottom of her  left great toe.   Oct 30, 2014:  Kristin Ina, PhD is a 75 y.o. female who presents for  Follow up of her CHF. Is getting over a cold. Has gained some weight.  Is back exercising.  Does water exercises.  Likes to go line dancing .   No CP , breathing is ok.  Has lots of stress with her 26 yo son.   Nov. 8,  2016:  Doing well from a cardiac standpoint.  Lots of issues with GERD. Lots of stress ,    No Cp or dyspnea.   November 22, 2015:  Doing well.  Has gained 13 lbs since her last OV in Nov.  She is very frustrated with her 78 year old son who still lives in the house with her.   Breathing is good.   Swim 3 times  a week.     Past Medical History  Diagnosis Date  . Coronary artery disease   . CHF (congestive heart failure) (HCC)     Dilated cardiomyopathy. Her original ejection fraction was 10-20%. Her ejection fraction has improved to 57% by 2009  . Hypertension   . Dyslipidemia   . Chest pain   . Myocardial infarction ([place])   . Depression   . Obesity   . Headache(784.0)   . Carpal tunnel syndrome     Left Hand  . GERD (gastroesophageal reflux disease)   .  Lower back pain   . Hemorrhoids   . History of anxiety   . Osteoarthritis     Severe with collapse of the left hip  . Foot drop, left     Past Surgical History  Procedure Laterality Date  . Tubal ligation    . Cardiac catheterization      Ejection Fraction was around 45-50%  . Replacement total hip w/  resurfacing implants  2002    left  . Hysteroscopy  09/03/2011    Procedure: HYSTEROSCOPY;  Surgeon: Logan Bores, MD;  Location: [facility];  Service: Gynecology;  Laterality: N/A;  With Resection of polyp with truclear system     Current Outpatient Prescriptions  Medication Sig Dispense Refill  . aspirin 81 MG tablet Take 81 mg by mouth daily.     Marland Kitchen atorvastatin (LIPITOR) 40 MG tablet Take 40 mg by mouth daily.  1  . calcium carbonate (OS-CAL - DOSED IN MG OF ELEMENTAL CALCIUM) 1250 (500 Ca) MG  tablet Take 1 tablet by mouth daily with breakfast.    . carvedilol (COREG) 12.5 MG tablet TAKE ONE TABLET BY MOUTH TWICE DAILY WITH MEAL 60 tablet 2  . furosemide (LASIX) 40 MG tablet Take 1 tablet (40 mg total) by mouth daily. 30 tablet 5  . lisinopril (PRINIVIL,ZESTRIL) 10 MG tablet Take 1 tablet (10 mg total) by mouth daily. 30 tablet 6  . LORazepam (ATIVAN) 0.5 MG tablet Take 0.5 mg by mouth daily as needed for anxiety.     . Multiple Vitamin (MULTI-VITAMIN PO) Take 1 tablet by mouth daily.     . naproxen (NAPROSYN) 500 MG tablet Take 250 mg by mouth daily as needed for mild pain.     . nitroGLYCERIN (NITROSTAT) 0.4 MG SL tablet Place 1 tablet (0.4 mg total) under the tongue every 5 (five) minutes as needed for chest pain. 25 tablet 5  . Omega-3 Fatty Acids (FISH OIL) 1200 MG CAPS Take 1 capsule by mouth daily.    . pantoprazole (PROTONIX) 40 MG tablet Take 40 mg by mouth 2 (two) times daily.     Marland Kitchen venlafaxine XR (EFFEXOR-XR) 75 MG 24 hr capsule Take 75 mg by mouth daily.  0  . Vitamin D, Ergocalciferol, (DRISDOL) 50000 UNITS CAPS Take 50,000 Units by mouth every 30 (thirty) days.      No current facility-administered medications for this visit.    Allergies:   Nickel    Social History:  The patient  reports that she has never smoked. She does not have any smokeless tobacco history on file. She reports that she does not drink alcohol or use illicit drugs.   Family History:  The patient's family history includes Cancer in her sister; Stroke in her maternal grandfather and mother.    ROS:  Please see the history of present illness.    Review of Systems: Constitutional:  denies fever, chills, diaphoresis, appetite change and fatigue.  HEENT: denies photophobia, eye pain, redness, hearing loss, ear pain, congestion, sore throat, rhinorrhea, sneezing, neck pain, neck stiffness and tinnitus.  Respiratory: denies SOB, DOE, cough, chest tightness, and wheezing.  Cardiovascular: denies  chest pain, palpitations and leg swelling.  Gastrointestinal: denies nausea, vomiting, abdominal pain, diarrhea, constipation, blood in stool.  Genitourinary: denies dysuria, urgency, frequency, hematuria, flank pain and difficulty urinating.  Musculoskeletal: denies  myalgias, back pain, joint swelling, arthralgias and gait problem.   Skin: denies pallor, rash and wound.  Neurological: denies dizziness, seizures, syncope, weakness, light-headedness, numbness and  headaches.   Hematological: denies adenopathy, easy bruising, personal or family bleeding history.  Psychiatric/ Behavioral: denies suicidal ideation, mood changes, confusion, nervousness, sleep disturbance and agitation.       All other systems are reviewed and negative.    PHYSICAL EXAM: VS:  BP 120/60 mmHg  Pulse 77  Ht 5' 6.5" (1.689 m)  Wt 224 lb 6.4 oz (101.787 kg)  BMI 35.68 kg/m2 , BMI Body mass index is 35.68 kg/(m^2). GEN: Well nourished, well developed, in no acute distress HEENT: normal Neck: no JVD, carotid bruits, or masses Cardiac: RRR; no murmurs, rubs, or gallops,no edema  Respiratory:  clear to auscultation bilaterally, normal work of breathing GI: soft, nontender, nondistended, + BS MS: no deformity or atrophy Skin: warm and dry, no rash Neuro:  Strength and sensation are intact Psych: normal   EKG:  EKG is ordered today. The ekg ordered today demonstrates :  NSR at 77.  occasinal PACs with aberrant conduction .    Recent Labs: 04/23/2015: Hemoglobin 14.6; Platelets 205 11/11/2015: ALT 15; ALT 15; BUN 25; Creat 0.80; Potassium 4.2; Sodium 139    Lipid Panel    Component Value Date/Time   CHOL 161 11/11/2015 0909   TRIG 104 11/11/2015 0909   HDL 52 11/11/2015 0909   CHOLHDL 3.1 11/11/2015 0909   VLDL 21 11/11/2015 0909   LDLCALC 88 11/11/2015 0909      Wt Readings from Last 3 Encounters:  11/22/15 224 lb 6.4 oz (101.787 kg)  04/30/15 211 lb 6.4 oz (95.89 kg)  10/30/14 224 lb (101.606  kg)      Other studies Reviewed: Additional studies/ records that were reviewed today include: . Review of the above records demonstrates:    ASSESSMENT AND PLAN:  1. Chronic systolic CHF- LV EF has improved dramatically - no symptoms at this point , cont current meds She exercises fairly regularly.   2. Hyperlipidemia - labs look good.  Will check labs again when I see her in 6 months.  3. Left hip replacement  4. Weight gain :   Encouraged her to exercise more and to cut back on her portions.   Current medicines are reviewed at length with the patient today.  The patient does not have concerns regarding medicines.  The following changes have been made:  no change  Labs/ tests ordered today include:  No orders of the defined types were placed in this encounter.    Disposition:   FU with me in 6 months      Kristin Moores, MD  11/22/2015 4:35 PM    [facility] [place], [place], [place]  [address] Phone: [phone]; Fax: [phone]   [facility]  [address] [place], [place]  [address] [phone]    Fax [phone]

## 2015-11-22 NOTE — Patient Instructions (Signed)

## 2015-12-02 DIAGNOSIS — F339 Major depressive disorder, recurrent, unspecified: Secondary | ICD-10-CM | POA: Diagnosis not present

## 2015-12-02 DIAGNOSIS — D1801 Hemangioma of skin and subcutaneous tissue: Secondary | ICD-10-CM | POA: Diagnosis not present

## 2015-12-02 DIAGNOSIS — L821 Other seborrheic keratosis: Secondary | ICD-10-CM | POA: Diagnosis not present

## 2015-12-02 DIAGNOSIS — C44311 Basal cell carcinoma of skin of nose: Secondary | ICD-10-CM | POA: Diagnosis not present

## 2015-12-02 DIAGNOSIS — D485 Neoplasm of uncertain behavior of skin: Secondary | ICD-10-CM | POA: Diagnosis not present

## 2015-12-02 DIAGNOSIS — F429 Obsessive-compulsive disorder, unspecified: Secondary | ICD-10-CM | POA: Diagnosis not present

## 2015-12-02 DIAGNOSIS — L603 Nail dystrophy: Secondary | ICD-10-CM | POA: Diagnosis not present

## 2015-12-11 DIAGNOSIS — F429 Obsessive-compulsive disorder, unspecified: Secondary | ICD-10-CM | POA: Diagnosis not present

## 2015-12-11 DIAGNOSIS — F339 Major depressive disorder, recurrent, unspecified: Secondary | ICD-10-CM | POA: Diagnosis not present

## 2015-12-19 ENCOUNTER — Telehealth: Payer: Self-pay | Admitting: Cardiovascular Disease

## 2015-12-19 NOTE — Telephone Encounter (Signed)
New Message:    Please call,concerning pt,Kristin Mack.

## 2015-12-19 NOTE — Telephone Encounter (Signed)
Patient is having MOHS on her nose with local numbing agent for Basal Cell Carcinoma Skin Cancer. Patient wants to make sure she has cardiac clearance for this procedure, per nurse at Dr. Wilhemina Bonito office, her dermatologist. Fax to [phone]

## 2015-12-19 NOTE — Telephone Encounter (Signed)
Neko is at low risk for her MOHS procedure

## 2015-12-25 DIAGNOSIS — F429 Obsessive-compulsive disorder, unspecified: Secondary | ICD-10-CM | POA: Diagnosis not present

## 2015-12-25 DIAGNOSIS — F339 Major depressive disorder, recurrent, unspecified: Secondary | ICD-10-CM | POA: Diagnosis not present

## 2015-12-26 DIAGNOSIS — F329 Major depressive disorder, single episode, unspecified: Secondary | ICD-10-CM | POA: Diagnosis not present

## 2015-12-26 NOTE — Telephone Encounter (Signed)
Clearance faxed to [phone] and confirmation received

## 2015-12-31 ENCOUNTER — Other Ambulatory Visit: Payer: Self-pay | Admitting: Cardiovascular Disease

## 2016-01-02 DIAGNOSIS — Z130 Encounter for screening for diseases of the blood and blood-forming organs and certain disorders involving the immune mechanism: Secondary | ICD-10-CM | POA: Diagnosis not present

## 2016-01-02 DIAGNOSIS — L900 Lichen sclerosus et atrophicus: Secondary | ICD-10-CM | POA: Diagnosis not present

## 2016-01-02 DIAGNOSIS — Z01419 Encounter for gynecological examination (general) (routine) without abnormal findings: Secondary | ICD-10-CM | POA: Diagnosis not present

## 2016-01-02 DIAGNOSIS — Z1389 Encounter for screening for other disorder: Secondary | ICD-10-CM | POA: Diagnosis not present

## 2016-01-02 DIAGNOSIS — Z6834 Body mass index (BMI) 34.0-34.9, adult: Secondary | ICD-10-CM | POA: Diagnosis not present

## 2016-01-03 ENCOUNTER — Telehealth: Payer: Self-pay | Admitting: Internal Medicine

## 2016-01-03 ENCOUNTER — Other Ambulatory Visit: Payer: Self-pay | Admitting: Obstetrics and Gynecology

## 2016-01-03 DIAGNOSIS — N6310 Unspecified lump in the right breast, unspecified quadrant: Secondary | ICD-10-CM

## 2016-01-03 NOTE — Telephone Encounter (Signed)
Kristin Mack would you please call this pt? She is asking questions about the equipment and scopes that I cannot answer.

## 2016-01-07 DIAGNOSIS — Z85828 Personal history of other malignant neoplasm of skin: Secondary | ICD-10-CM | POA: Diagnosis not present

## 2016-01-07 DIAGNOSIS — C44311 Basal cell carcinoma of skin of nose: Secondary | ICD-10-CM | POA: Diagnosis not present

## 2016-01-07 NOTE — Telephone Encounter (Signed)
Phoned pt and answered questions as far as changes that have occurred since her last colonoscopy 10 years ago.  She also had questions about our physicians ages and medical schools which I answered as best I could.  She requested that I locate her previous colonoscopy report and mail to her.

## 2016-01-08 ENCOUNTER — Ambulatory Visit
Admission: RE | Admit: 2016-01-08 | Discharge: 2016-01-08 | Disposition: A | Discharge status: Home / Self Care | Payer: Medicare Other | Source: Ambulatory Visit | Attending: Obstetrics and Gynecology | Admitting: Obstetrics and Gynecology

## 2016-01-08 DIAGNOSIS — R921 Mammographic calcification found on diagnostic imaging of breast: Secondary | ICD-10-CM | POA: Diagnosis not present

## 2016-01-08 DIAGNOSIS — N6310 Unspecified lump in the right breast, unspecified quadrant: Secondary | ICD-10-CM

## 2016-01-08 DIAGNOSIS — F429 Obsessive-compulsive disorder, unspecified: Secondary | ICD-10-CM | POA: Diagnosis not present

## 2016-01-08 DIAGNOSIS — F329 Major depressive disorder, single episode, unspecified: Secondary | ICD-10-CM | POA: Diagnosis not present

## 2016-01-09 DIAGNOSIS — R29890 Loss of height: Secondary | ICD-10-CM | POA: Diagnosis not present

## 2016-01-09 DIAGNOSIS — M899 Disorder of bone, unspecified: Secondary | ICD-10-CM | POA: Diagnosis not present

## 2016-01-09 DIAGNOSIS — Z780 Asymptomatic menopausal state: Secondary | ICD-10-CM | POA: Insufficient documentation

## 2016-01-09 DIAGNOSIS — M21372 Foot drop, left foot: Secondary | ICD-10-CM | POA: Diagnosis not present

## 2016-01-09 DIAGNOSIS — K219 Gastro-esophageal reflux disease without esophagitis: Secondary | ICD-10-CM | POA: Diagnosis not present

## 2016-01-09 DIAGNOSIS — I509 Heart failure, unspecified: Secondary | ICD-10-CM | POA: Diagnosis not present

## 2016-01-09 DIAGNOSIS — M4316 Spondylolisthesis, lumbar region: Secondary | ICD-10-CM | POA: Diagnosis not present

## 2016-01-09 DIAGNOSIS — Z96642 Presence of left artificial hip joint: Secondary | ICD-10-CM | POA: Diagnosis not present

## 2016-01-09 DIAGNOSIS — M438X6 Other specified deforming dorsopathies, lumbar region: Secondary | ICD-10-CM | POA: Diagnosis not present

## 2016-01-09 DIAGNOSIS — M5136 Other intervertebral disc degeneration, lumbar region: Secondary | ICD-10-CM | POA: Diagnosis not present

## 2016-01-09 DIAGNOSIS — M47816 Spondylosis without myelopathy or radiculopathy, lumbar region: Secondary | ICD-10-CM | POA: Diagnosis not present

## 2016-01-09 DIAGNOSIS — M949 Disorder of cartilage, unspecified: Secondary | ICD-10-CM | POA: Diagnosis not present

## 2016-01-09 DIAGNOSIS — M4820 Kissing spine, site unspecified: Secondary | ICD-10-CM | POA: Diagnosis not present

## 2016-01-09 DIAGNOSIS — M47818 Spondylosis without myelopathy or radiculopathy, sacral and sacrococcygeal region: Secondary | ICD-10-CM | POA: Diagnosis not present

## 2016-01-09 DIAGNOSIS — F329 Major depressive disorder, single episode, unspecified: Secondary | ICD-10-CM | POA: Diagnosis not present

## 2016-01-09 DIAGNOSIS — I10 Essential (primary) hypertension: Secondary | ICD-10-CM | POA: Diagnosis not present

## 2016-01-09 DIAGNOSIS — M4814 Ankylosing hyperostosis [Forestier], thoracic region: Secondary | ICD-10-CM | POA: Diagnosis not present

## 2016-02-05 ENCOUNTER — Other Ambulatory Visit: Payer: Self-pay | Admitting: Cardiovascular Disease

## 2016-02-05 DIAGNOSIS — F429 Obsessive-compulsive disorder, unspecified: Secondary | ICD-10-CM | POA: Diagnosis not present

## 2016-02-05 DIAGNOSIS — F329 Major depressive disorder, single episode, unspecified: Secondary | ICD-10-CM | POA: Diagnosis not present

## 2016-02-10 DIAGNOSIS — M899 Disorder of bone, unspecified: Secondary | ICD-10-CM | POA: Diagnosis not present

## 2016-02-12 ENCOUNTER — Encounter: Payer: Self-pay | Admitting: Internal Medicine

## 2016-02-19 ENCOUNTER — Ambulatory Visit: Payer: Medicare Other

## 2016-02-19 DIAGNOSIS — I10 Essential (primary) hypertension: Secondary | ICD-10-CM | POA: Diagnosis not present

## 2016-02-19 DIAGNOSIS — G44309 Post-traumatic headache, unspecified, not intractable: Secondary | ICD-10-CM | POA: Diagnosis not present

## 2016-02-19 DIAGNOSIS — M546 Pain in thoracic spine: Secondary | ICD-10-CM | POA: Insufficient documentation

## 2016-02-19 DIAGNOSIS — W0110XA Fall on same level from slipping, tripping and stumbling with subsequent striking against unspecified object, initial encounter: Secondary | ICD-10-CM | POA: Diagnosis not present

## 2016-02-19 DIAGNOSIS — M542 Cervicalgia: Secondary | ICD-10-CM | POA: Insufficient documentation

## 2016-02-19 DIAGNOSIS — W1849XA Other slipping, tripping and stumbling without falling, initial encounter: Secondary | ICD-10-CM | POA: Insufficient documentation

## 2016-02-20 DIAGNOSIS — M47814 Spondylosis without myelopathy or radiculopathy, thoracic region: Secondary | ICD-10-CM | POA: Diagnosis not present

## 2016-02-20 DIAGNOSIS — R51 Headache: Secondary | ICD-10-CM | POA: Diagnosis not present

## 2016-02-20 DIAGNOSIS — R42 Dizziness and giddiness: Secondary | ICD-10-CM | POA: Diagnosis not present

## 2016-02-20 DIAGNOSIS — R110 Nausea: Secondary | ICD-10-CM | POA: Diagnosis not present

## 2016-03-04 DIAGNOSIS — F429 Obsessive-compulsive disorder, unspecified: Secondary | ICD-10-CM | POA: Diagnosis not present

## 2016-03-04 DIAGNOSIS — F329 Major depressive disorder, single episode, unspecified: Secondary | ICD-10-CM | POA: Diagnosis not present

## 2016-03-18 DIAGNOSIS — F329 Major depressive disorder, single episode, unspecified: Secondary | ICD-10-CM | POA: Diagnosis not present

## 2016-03-18 DIAGNOSIS — F429 Obsessive-compulsive disorder, unspecified: Secondary | ICD-10-CM | POA: Diagnosis not present

## 2016-03-20 DIAGNOSIS — H40012 Open angle with borderline findings, low risk, left eye: Secondary | ICD-10-CM | POA: Diagnosis not present

## 2016-03-20 DIAGNOSIS — H40011 Open angle with borderline findings, low risk, right eye: Secondary | ICD-10-CM | POA: Diagnosis not present

## 2016-03-20 DIAGNOSIS — H5213 Myopia, bilateral: Secondary | ICD-10-CM | POA: Diagnosis not present

## 2016-03-24 DIAGNOSIS — M25511 Pain in right shoulder: Secondary | ICD-10-CM | POA: Diagnosis not present

## 2016-03-26 DIAGNOSIS — M25511 Pain in right shoulder: Secondary | ICD-10-CM | POA: Diagnosis not present

## 2016-04-01 DIAGNOSIS — F339 Major depressive disorder, recurrent, unspecified: Secondary | ICD-10-CM | POA: Diagnosis not present

## 2016-04-01 DIAGNOSIS — F429 Obsessive-compulsive disorder, unspecified: Secondary | ICD-10-CM | POA: Diagnosis not present

## 2016-04-05 DIAGNOSIS — Z23 Encounter for immunization: Secondary | ICD-10-CM | POA: Diagnosis not present

## 2016-04-07 ENCOUNTER — Other Ambulatory Visit: Payer: Self-pay | Admitting: Cardiovascular Disease

## 2016-04-07 NOTE — Telephone Encounter (Signed)
carvedilol (COREG) 12.5 MG tablet  Medication  Date: 12/31/2015 Department: [address] Office Ordering/Authorizing: Thayer Headings, MD  Order Providers   Prescribing Provider Encounter Provider  Thayer Headings, MD Thayer Headings, MD  Medication Detail    Disp Refills Start End   carvedilol (COREG) 12.5 MG tablet 60 tablet 11 12/31/2015    Sig: TAKE ONE TABLET BY MOUTH TWICE DAILY WITH A MEAL   E-Prescribing Status: Sent to pharmacy (12/31/2015 5:13 PM EDT)   Pharmacy   [facility] [place], [place] - [id number]

## 2016-04-15 DIAGNOSIS — F429 Obsessive-compulsive disorder, unspecified: Secondary | ICD-10-CM | POA: Diagnosis not present

## 2016-04-15 DIAGNOSIS — F339 Major depressive disorder, recurrent, unspecified: Secondary | ICD-10-CM | POA: Diagnosis not present

## 2016-04-20 DIAGNOSIS — R202 Paresthesia of skin: Secondary | ICD-10-CM | POA: Diagnosis not present

## 2016-04-20 DIAGNOSIS — M25511 Pain in right shoulder: Secondary | ICD-10-CM | POA: Diagnosis not present

## 2016-04-26 ENCOUNTER — Other Ambulatory Visit: Payer: Self-pay | Admitting: Cardiovascular Disease

## 2016-04-28 DIAGNOSIS — S46011A Strain of muscle(s) and tendon(s) of the rotator cuff of right shoulder, initial encounter: Secondary | ICD-10-CM | POA: Diagnosis not present

## 2016-04-28 DIAGNOSIS — G5601 Carpal tunnel syndrome, right upper limb: Secondary | ICD-10-CM | POA: Diagnosis not present

## 2016-04-28 DIAGNOSIS — M19011 Primary osteoarthritis, right shoulder: Secondary | ICD-10-CM | POA: Diagnosis not present

## 2016-04-29 DIAGNOSIS — F339 Major depressive disorder, recurrent, unspecified: Secondary | ICD-10-CM | POA: Diagnosis not present

## 2016-04-29 DIAGNOSIS — F429 Obsessive-compulsive disorder, unspecified: Secondary | ICD-10-CM | POA: Diagnosis not present

## 2016-04-30 DIAGNOSIS — E785 Hyperlipidemia, unspecified: Secondary | ICD-10-CM | POA: Diagnosis not present

## 2016-04-30 DIAGNOSIS — I2510 Atherosclerotic heart disease of native coronary artery without angina pectoris: Secondary | ICD-10-CM | POA: Diagnosis not present

## 2016-04-30 DIAGNOSIS — I429 Cardiomyopathy, unspecified: Secondary | ICD-10-CM | POA: Diagnosis not present

## 2016-04-30 DIAGNOSIS — R3129 Other microscopic hematuria: Secondary | ICD-10-CM | POA: Diagnosis not present

## 2016-04-30 DIAGNOSIS — Z0000 Encounter for general adult medical examination without abnormal findings: Secondary | ICD-10-CM | POA: Diagnosis not present

## 2016-05-05 DIAGNOSIS — M25511 Pain in right shoulder: Secondary | ICD-10-CM | POA: Diagnosis not present

## 2016-05-05 DIAGNOSIS — F418 Other specified anxiety disorders: Secondary | ICD-10-CM | POA: Diagnosis not present

## 2016-05-05 DIAGNOSIS — Z0000 Encounter for general adult medical examination without abnormal findings: Secondary | ICD-10-CM | POA: Diagnosis not present

## 2016-05-05 DIAGNOSIS — M2010 Hallux valgus (acquired), unspecified foot: Secondary | ICD-10-CM | POA: Diagnosis not present

## 2016-05-05 DIAGNOSIS — L97529 Non-pressure chronic ulcer of other part of left foot with unspecified severity: Secondary | ICD-10-CM | POA: Diagnosis not present

## 2016-05-05 DIAGNOSIS — M21372 Foot drop, left foot: Secondary | ICD-10-CM | POA: Diagnosis not present

## 2016-05-05 DIAGNOSIS — E784 Other hyperlipidemia: Secondary | ICD-10-CM | POA: Diagnosis not present

## 2016-05-05 DIAGNOSIS — E559 Vitamin D deficiency, unspecified: Secondary | ICD-10-CM | POA: Diagnosis not present

## 2016-05-05 DIAGNOSIS — I10 Essential (primary) hypertension: Secondary | ICD-10-CM | POA: Diagnosis not present

## 2016-05-05 DIAGNOSIS — Z6836 Body mass index (BMI) 36.0-36.9, adult: Secondary | ICD-10-CM | POA: Diagnosis not present

## 2016-05-05 DIAGNOSIS — I2510 Atherosclerotic heart disease of native coronary artery without angina pectoris: Secondary | ICD-10-CM | POA: Diagnosis not present

## 2016-05-05 DIAGNOSIS — Z1389 Encounter for screening for other disorder: Secondary | ICD-10-CM | POA: Diagnosis not present

## 2016-05-12 DIAGNOSIS — F429 Obsessive-compulsive disorder, unspecified: Secondary | ICD-10-CM | POA: Diagnosis not present

## 2016-05-12 DIAGNOSIS — F339 Major depressive disorder, recurrent, unspecified: Secondary | ICD-10-CM | POA: Diagnosis not present

## 2016-05-19 ENCOUNTER — Encounter: Payer: Self-pay | Admitting: Podiatry

## 2016-05-19 ENCOUNTER — Other Ambulatory Visit: Payer: Self-pay | Admitting: *Deleted

## 2016-05-19 ENCOUNTER — Ambulatory Visit (INDEPENDENT_AMBULATORY_CARE_PROVIDER_SITE_OTHER): Payer: Medicare Other | Admitting: Podiatry

## 2016-05-19 DIAGNOSIS — M79676 Pain in unspecified toe(s): Secondary | ICD-10-CM

## 2016-05-19 DIAGNOSIS — M2012 Hallux valgus (acquired), left foot: Secondary | ICD-10-CM

## 2016-05-19 DIAGNOSIS — B351 Tinea unguium: Secondary | ICD-10-CM | POA: Diagnosis not present

## 2016-05-19 NOTE — Progress Notes (Signed)
   Subjective:    Patient ID: Kristin Ina, PhD, female    DOB: Aug 17, 1940, 75 y.o.   MRN: [id number]  HPI: She presents today with a chief concern of thickened dystrophic painful nails with questions concerning her bunions nail deformities and hammertoe deformities. She has a history of an ulceration to the plantar medial aspect of the IP joint hallux left but she states that she continues to wash daily with soap and apply a dressing.    Review of Systems  All other systems reviewed and are negative.      Objective:   Physical Exam: Vital signs are stable she is alert and oriented 3. Pulses are palpable. Neurologic sensorium is intact. Deep tendon reflexes are intact muscle strength is normal bilateral. Orthopedic evaluation demonstrates severe hallux abductovalgus deformities bilateral with hammertoe deformities 2 through 5 bilaterally. These hammertoe deformities are rigid in nature which have resulted in nail dystrophy of the nails possible mycosis. She has limited range of motion of the first metatarsophalangeal joint secondary to arthritic changes with the bunion deformities. She also has a healed ulceration to the plantar medial aspect of the IP joint due to the valgus deformation of the hallux. Toenails are thick yellow dystrophic turned up on the hands due to their length as well as the hammertoe deformity        Assessment & Plan:  Pain and limp secondary to onychomycosis hallux abductovalgus deformity history of ulceration and hammertoe deformity.  Plan: Debridement of toenails 1 through 5 bilateral. Discussed in great detail today surgical options for hallux valgus repair and hammertoe repair. I debrided toenails 1 through 5 today.

## 2016-05-25 ENCOUNTER — Telehealth: Payer: Self-pay | Admitting: Cardiovascular Disease

## 2016-05-25 NOTE — Telephone Encounter (Signed)
Follow Up: ° ° ° °Returning your call again. °

## 2016-05-25 NOTE — Telephone Encounter (Signed)
Patient states she needs to have wrist and shoulder surgery..  She also states she has had some chest tightness in her upper left chest.  What does she need to do (does she need to be seen, have stress test?) to get surgical clearance for her 2 surgeries?

## 2016-05-25 NOTE — Telephone Encounter (Signed)
Left message for patient to call back  

## 2016-05-25 NOTE — Telephone Encounter (Signed)
Spoke with patient who states she needs surgery for shoulder and carpal tunnel.  She states she mentioned some occasional chest pain to her PCP and he advised her to have it evaluated by Dr. Acie Fredrickson.  I advised that she is due this month for her 6 month office visit.  I scheduled her for Wed. 12/6 with Dr. Acie Fredrickson.  She verbalized agreement and thanked me for the call.

## 2016-05-27 ENCOUNTER — Encounter: Payer: Self-pay | Admitting: Cardiovascular Disease

## 2016-05-27 ENCOUNTER — Ambulatory Visit (INDEPENDENT_AMBULATORY_CARE_PROVIDER_SITE_OTHER): Payer: Medicare Other | Admitting: Cardiovascular Disease

## 2016-05-27 VITALS — BP 104/62 | HR 69 | Ht 66.0 in | Wt 230.4 lb

## 2016-05-27 DIAGNOSIS — F339 Major depressive disorder, recurrent, unspecified: Secondary | ICD-10-CM | POA: Diagnosis not present

## 2016-05-27 DIAGNOSIS — I2510 Atherosclerotic heart disease of native coronary artery without angina pectoris: Secondary | ICD-10-CM | POA: Diagnosis not present

## 2016-05-27 DIAGNOSIS — I5022 Chronic systolic (congestive) heart failure: Secondary | ICD-10-CM | POA: Diagnosis not present

## 2016-05-27 DIAGNOSIS — F429 Obsessive-compulsive disorder, unspecified: Secondary | ICD-10-CM | POA: Diagnosis not present

## 2016-05-27 LAB — LIPID PANEL
Cholesterol: 185 mg/dL (ref <200)
HDL: 68 mg/dL (ref >50)
LDL Cholesterol: 94 mg/dL (ref <100)
Total CHOL/HDL Ratio: 2.7 Ratio (ref <5.0)
Triglycerides: 116 mg/dL (ref <150)
VLDL: 23 mg/dL (ref <30)

## 2016-05-27 LAB — COMPREHENSIVE METABOLIC PANEL
ALT: 18 U/L (ref 6–29)
AST: 23 U/L (ref 10–35)
Albumin: 4.4 g/dL (ref 3.6–5.1)
Alkaline Phosphatase: 80 U/L (ref 33–130)
BUN: 25 mg/dL (ref 7–25)
CO2: 27 mmol/L (ref 20–31)
Calcium: 9.8 mg/dL (ref 8.6–10.4)
Chloride: 100 mmol/L (ref 98–110)
Creat: 0.85 mg/dL (ref 0.60–0.93)
Glucose, Bld: 103 mg/dL — ABNORMAL HIGH (ref 65–99)
Potassium: 5.5 mmol/L — ABNORMAL HIGH (ref 3.5–5.3)
Sodium: 137 mmol/L (ref 135–146)
Total Bilirubin: 0.8 mg/dL (ref 0.2–1.2)
Total Protein: 7.4 g/dL (ref 6.1–8.1)

## 2016-05-27 NOTE — Progress Notes (Signed)
Cardiology Office Note   Date:  05/27/2016   ID:  Kristin Ina, PhD, DOB 1941/01/12, MRN [id number]  PCP:  Donnajean Lopes, MD  Cardiologist:   Mertie Moores, MD   Chief Complaint  Patient presents with  . Follow-up    CHF   1. CHF- LV EF has improved dramatically 2. Hyperlipidemia 3. Left hip replacement  History of Present Illness:  75 year old female with history of congestive heart failure. Her original ejection fraction was 10-20%. Her ejection fraction has dramatically improved and is now 57% ( Echo 2009). She had an echocardiogram in February 2011 that revealed normal left ventricle systolic function.  She's had 2 heart catheterizations. Her last cardiac cath was in 2006 which revealed moderate irregularities.  She's not having any episodes of chest pain or shortness breath. She works out a regular basis at least 2-3 times a week.  She has gained 20 lbs over the past several months. She has a foot ulcer in her left foot and has not been in the pool.  She's not had any angina-like chest pain. She did have some stress-related chest discomfort around Christmastime but that has since resolved.  October 18, 2012:  Kristin Mack has done well for the past 6 months. She has not had any dyspnea. She has had lots of left hip pain and has not been able to exercise as much. She is swimming 4 times a week.  Nov. 24, 2014:  Kristin Mack is doing well. She has been having some acid reflux ( dry cough).  She has been trying eating smaller portions, earing earlier in the evening, avoiding spicy foods.   May 27,2015: Kristin Mack is doing well. No CP or dyspnea.  Stressed out over family issues. Her sons were living with her for several months. She has gone up on her Effexor.  She has had some left thigh problems.   Dec. 1, 2015:  Kristin Mack is seen today for follow up of her CHF. She has done well.  She has not been swimming as much recently - has an ulcer on the  bottom of her left great toe.   Oct 30, 2014:  Kristin Ina, PhD is a 75 y.o. female who presents for  Follow up of her CHF. Is getting over a cold. Has gained some weight.  Is back exercising.  Does water exercises.  Likes to go line dancing .   No CP , breathing is ok.  Has lots of stress with her 72 yo son.   Nov. 8,  2016:  Doing well from a cardiac standpoint.  Lots of issues with GERD. Lots of stress ,    No Cp or dyspnea.   November 22, 2015:  Doing well.  Has gained 13 lbs since her last OV in Nov.  She is very frustrated with her 51 year old son who still lives in the house with her.   Breathing is good.   Swim 3 times  a week.    Dec. 6, 2017  Kristin Mack is seen today for a preoperative evaluation. She needs to have a right shoulder surgery and right carpal tunnel surgery.  She is doing well from a cardiac standpoint.   Has brief Chest pain / left shoulder pain .    Pains last for 2-3 minutes.   Not related to exercise - may occur after swimming.     Past Medical History:  Diagnosis Date  . Carpal tunnel syndrome    Left Hand  .  Chest pain   . CHF (congestive heart failure) (HCC)    Dilated cardiomyopathy. Her original ejection fraction was 10-20%. Her ejection fraction has improved to 57% by 2009  . Coronary artery disease   . Depression   . Dyslipidemia   . Foot drop, left   . GERD (gastroesophageal reflux disease)   . Headache(784.0)   . Hemorrhoids   . History of anxiety   . Hypertension   . Lower back pain   . Myocardial infarction   . Obesity   . Osteoarthritis    Severe with collapse of the left hip    Past Surgical History:  Procedure Laterality Date  . CARDIAC CATHETERIZATION     Ejection Fraction was around 45-50%  . HYSTEROSCOPY  09/03/2011   Procedure: HYSTEROSCOPY;  Surgeon: Logan Bores, MD;  Location: [facility];  Service: Gynecology;  Laterality: N/A;  With Resection of polyp with truclear system  . REPLACEMENT TOTAL HIP W/   RESURFACING IMPLANTS  2002   left  . TUBAL LIGATION       Current Outpatient Prescriptions  Medication Sig Dispense Refill  . aspirin 81 MG tablet Take 81 mg by mouth daily.     Marland Kitchen atorvastatin (LIPITOR) 40 MG tablet Take 40 mg by mouth daily.  1  . calcium carbonate (OS-CAL - DOSED IN MG OF ELEMENTAL CALCIUM) 1250 (500 Ca) MG tablet Take 1 tablet by mouth daily with breakfast.    . carvedilol (COREG) 12.5 MG tablet TAKE ONE TABLET BY MOUTH TWICE DAILY WITH A MEAL 60 tablet 11  . clobetasol cream (TEMOVATE) 0.05 %     . furosemide (LASIX) 40 MG tablet take 1 tablet by mouth once daily 30 tablet 9  . lisinopril (PRINIVIL,ZESTRIL) 10 MG tablet take 1 tablet by mouth once daily 30 tablet 7  . LORazepam (ATIVAN) 0.5 MG tablet Take 0.5 mg by mouth daily as needed for anxiety.     . Multiple Vitamin (MULTI-VITAMIN PO) Take 1 tablet by mouth daily.     . naproxen (NAPROSYN) 500 MG tablet Take 250 mg by mouth daily as needed for mild pain.     . nitroGLYCERIN (NITROSTAT) 0.4 MG SL tablet Place 1 tablet (0.4 mg total) under the tongue every 5 (five) minutes as needed for chest pain. 25 tablet 5  . Omega-3 Fatty Acids (FISH OIL) 1200 MG CAPS Take 1 capsule by mouth daily.    . pantoprazole (PROTONIX) 40 MG tablet Take 40 mg by mouth 2 (two) times daily.     . Venlafaxine HCl (EFFEXOR PO) Take 225 mg by mouth daily.    . Vitamin D, Ergocalciferol, (DRISDOL) 50000 UNITS CAPS Take 50,000 Units by mouth every 30 (thirty) days.      No current facility-administered medications for this visit.     Allergies:   Nickel    Social History:  The patient  reports that she has never smoked. She has never used smokeless tobacco. She reports that she does not drink alcohol or use drugs.   Family History:  The patient's family history includes Cancer in her sister; Stroke in her maternal grandfather and mother.    ROS:  Please see the history of present illness.    Review of Systems: Constitutional:   denies fever, chills, diaphoresis, appetite change and fatigue.  HEENT: denies photophobia, eye pain, redness, hearing loss, ear pain, congestion, sore throat, rhinorrhea, sneezing, neck pain, neck stiffness and tinnitus.  Respiratory: denies SOB, DOE, cough, chest tightness, and  wheezing.  Cardiovascular: denies chest pain, palpitations and leg swelling.  Gastrointestinal: denies nausea, vomiting, abdominal pain, diarrhea, constipation, blood in stool.  Genitourinary: denies dysuria, urgency, frequency, hematuria, flank pain and difficulty urinating.  Musculoskeletal: denies  myalgias, back pain, joint swelling, arthralgias and gait problem.   Skin: denies pallor, rash and wound.  Neurological: denies dizziness, seizures, syncope, weakness, light-headedness, numbness and headaches.   Hematological: denies adenopathy, easy bruising, personal or family bleeding history.  Psychiatric/ Behavioral: denies suicidal ideation, mood changes, confusion, nervousness, sleep disturbance and agitation.       All other systems are reviewed and negative.    PHYSICAL EXAM: VS:  BP 104/62   Pulse 69   Ht 5\' 6"  (1.676 m)   Wt 230 lb 6.4 oz (104.5 kg)   BMI 37.19 kg/m  , BMI Body mass index is 37.19 kg/m. GEN: Well nourished, well developed, in no acute distress  HEENT: normal  Neck: no JVD, carotid bruits, or masses Cardiac: RRR; no murmurs, rubs, or gallops,no edema  Respiratory:  clear to auscultation bilaterally, normal work of breathing GI: soft, nontender, nondistended, + BS MS: no deformity or atrophy  Skin: warm and dry, no rash Neuro:  Strength and sensation are intact Psych: normal   EKG:  EKG is ordered today. The ekg ordered today demonstrates :  NSR at 69. occasinal     Recent Labs: 11/11/2015: ALT 15; ALT 15; BUN 25; Creat 0.80; Potassium 4.2; Sodium 139    Lipid Panel    Component Value Date/Time   CHOL 161 11/11/2015 0909   TRIG 104 11/11/2015 0909   HDL 52 11/11/2015  0909   CHOLHDL 3.1 11/11/2015 0909   VLDL 21 11/11/2015 0909   LDLCALC 88 11/11/2015 0909      Wt Readings from Last 3 Encounters:  05/27/16 230 lb 6.4 oz (104.5 kg)  11/22/15 224 lb 6.4 oz (101.8 kg)  04/30/15 211 lb 6.4 oz (95.9 kg)      Other studies Reviewed: Additional studies/ records that were reviewed today include: . Review of the above records demonstrates:    ASSESSMENT AND PLAN:  1. Chronic systolic CHF- LV EF has improved dramatically - no symptoms at this point , cont current meds She exercises fairly regularly.  2. CAD :  Has known moderate  coronary artery disease. She has been having some episodes of left shoulder pain that seemed to occur right after exercising. It is not clear whether these are musculoskeletal. She needs to have shoulder surgery. We'll schedule her for a 2 day  [place] study.  I would prefer a 2 day study given her body habitus and the fact that she has known LAD and diagonal disease.  3. Hyperlipidemia -   Will check labs today .  Last cholesterol levels are very well-controlled.  3. Left hip replacement  4. Weight gain :   Encouraged her to exercise more and to cut back on her portions.   Current medicines are reviewed at length with the patient today.  The patient does not have concerns regarding medicines.  The following changes have been made:  no change  Labs/ tests ordered today include:  No orders of the defined types were placed in this encounter.   Disposition:   FU with me in 6 months      Mertie Moores, MD  05/27/2016 10:08 AM    [facility] [place], [place], [place]  [address] Phone: [phone]; Fax: [phone]  [place]  [place], [place]  [address] [phone]    Fax [phone]

## 2016-05-27 NOTE — Addendum Note (Signed)
Addended by: Briant Cedar on: 05/27/2016 04:14 PM   Modules accepted: Orders

## 2016-05-27 NOTE — Patient Instructions (Signed)
Medication Instructions:  Your physician recommends that you continue on your current medications as directed. Please refer to the Current Medication list given to you today.   Labwork: TODAY - complete metabolic panel,  Cholesterol   Testing/Procedures: Your physician has requested that you have a lexiscan myoview. For further information please visit [email or web address]. Please follow instruction sheet, as given.   Follow-Up: Your physician wants you to follow-up in: 6 months with Dr. Acie Fredrickson.  You will receive a reminder letter in the mail two months in advance. If you don't receive a letter, please call our office to schedule the follow-up appointment.   If you need a refill on your cardiac medications before your next appointment, please call your pharmacy.   Thank you for choosing [facility]! Christen Bame, RN [phone]

## 2016-06-01 ENCOUNTER — Ambulatory Visit (HOSPITAL_COMMUNITY): Payer: Medicare Other | Attending: Cardiovascular Disease

## 2016-06-01 ENCOUNTER — Telehealth: Payer: Self-pay | Admitting: Cardiovascular Disease

## 2016-06-01 DIAGNOSIS — I2510 Atherosclerotic heart disease of native coronary artery without angina pectoris: Secondary | ICD-10-CM | POA: Insufficient documentation

## 2016-06-01 MED ORDER — TECHNETIUM TC 99M TETROFOSMIN IV KIT
30.1000 | PACK | Freq: Once | INTRAVENOUS | Status: AC | PRN
  Administered 2016-06-01: 30.1 via INTRAVENOUS
  Filled 2016-06-01: qty 31

## 2016-06-01 NOTE — Telephone Encounter (Signed)
Left detailed message about the nature of my call on Thursday (lab work) and to call back with questions.

## 2016-06-01 NOTE — Telephone Encounter (Signed)
Kristin Mack is returning a call .Marland Kitchen Thanks

## 2016-06-03 ENCOUNTER — Telehealth: Payer: Self-pay | Admitting: Cardiovascular Disease

## 2016-06-03 ENCOUNTER — Ambulatory Visit (HOSPITAL_COMMUNITY): Payer: Medicare Other | Attending: Cardiology

## 2016-06-03 DIAGNOSIS — I2510 Atherosclerotic heart disease of native coronary artery without angina pectoris: Secondary | ICD-10-CM | POA: Diagnosis not present

## 2016-06-03 LAB — MYOCARDIAL PERFUSION IMAGING
LV dias vol: 114 mL (ref 46–106)
LV sys vol: 59 mL
Peak HR: 75 {beats}/min
RATE: 0.25
Rest HR: 66 {beats}/min
SDS: 3
SRS: 5
SSS: 8
TID: 1.03

## 2016-06-03 MED ORDER — TECHNETIUM TC 99M TETROFOSMIN IV KIT
30.3000 | PACK | Freq: Once | INTRAVENOUS | Status: AC | PRN
  Administered 2016-06-03: 30.3 via INTRAVENOUS
  Filled 2016-06-03: qty 31

## 2016-06-03 MED ORDER — REGADENOSON 0.4 MG/5ML IV SOLN
0.4000 mg | Freq: Once | INTRAVENOUS | Status: AC
  Administered 2016-06-03: 0.4 mg via INTRAVENOUS

## 2016-06-03 NOTE — Telephone Encounter (Signed)
Follow-up  Pt finished her last test today, she wanted to make you aware. Pt needs clearance for surgery asap.

## 2016-06-04 ENCOUNTER — Ambulatory Visit (HOSPITAL_COMMUNITY): Payer: Medicare Other

## 2016-06-04 NOTE — Telephone Encounter (Signed)
Spoke with patient and advised her of results of lexiscan myoview.  I advised her that Dr. Acie Fredrickson has signed surgical clearance and I am faxing to [facility].  She requests copies of myoview to PCP and to herself. I advised her per Dr. Acie Fredrickson, to hold aspirin 7 days prior to surgery.  Patient verbalized understanding and agreement and thanked me for the call.

## 2016-06-10 DIAGNOSIS — F339 Major depressive disorder, recurrent, unspecified: Secondary | ICD-10-CM | POA: Diagnosis not present

## 2016-06-10 DIAGNOSIS — F429 Obsessive-compulsive disorder, unspecified: Secondary | ICD-10-CM | POA: Diagnosis not present

## 2016-06-17 DIAGNOSIS — F329 Major depressive disorder, single episode, unspecified: Secondary | ICD-10-CM | POA: Diagnosis not present

## 2016-06-18 DIAGNOSIS — M75121 Complete rotator cuff tear or rupture of right shoulder, not specified as traumatic: Secondary | ICD-10-CM | POA: Diagnosis not present

## 2016-06-18 DIAGNOSIS — M19011 Primary osteoarthritis, right shoulder: Secondary | ICD-10-CM | POA: Diagnosis not present

## 2016-06-18 DIAGNOSIS — M25511 Pain in right shoulder: Secondary | ICD-10-CM | POA: Diagnosis not present

## 2016-06-23 NOTE — H&P (Signed)
Kristin Ina, PhD is an 76 y.o. female.    Chief Complaint: right shoulder pain and hand numbness  HPI: Pt is a 76 y.o. female complaining of right shoulder pain for multiple years. Pain had continually increased since the beginning. X-rays in the clinic show end-stage arthritic changes of the right shoulder. Pt has tried various conservative treatments which have failed to alleviate their symptoms, including injections and therapy. Various options are discussed with the patient. Risks, benefits and expectations were discussed with the patient. Patient understand the risks, benefits and expectations and wishes to proceed with surgery.   PCP:  Donnajean Lopes, MD  D/C Plans: Home  PMH: Past Medical History:  Diagnosis Date  . Carpal tunnel syndrome    Left Hand  . Chest pain   . CHF (congestive heart failure) (HCC)    Dilated cardiomyopathy. Her original ejection fraction was 10-20%. Her ejection fraction has improved to 57% by 2009  . Coronary artery disease   . Depression   . Dyslipidemia   . Foot drop, left   . GERD (gastroesophageal reflux disease)   . Headache(784.0)   . Hemorrhoids   . History of anxiety   . Hypertension   . Lower back pain   . Myocardial infarction   . Obesity   . Osteoarthritis    Severe with collapse of the left hip    PSH: Past Surgical History:  Procedure Laterality Date  . CARDIAC CATHETERIZATION     Ejection Fraction was around 45-50%  . HYSTEROSCOPY  09/03/2011   Procedure: HYSTEROSCOPY;  Surgeon: Logan Bores, MD;  Location: [facility];  Service: Gynecology;  Laterality: N/A;  With Resection of polyp with truclear system  . REPLACEMENT TOTAL HIP W/  RESURFACING IMPLANTS  2002   left  . TUBAL LIGATION      Social History:  reports that she has never smoked. She has never used smokeless tobacco. She reports that she does not drink alcohol or use drugs.  Allergies:  Allergies  Allergen Reactions  . Nickel Itching and Rash     Medications: No current facility-administered medications for this encounter.    Current Outpatient Prescriptions  Medication Sig Dispense Refill  . aspirin 81 MG tablet Take 81 mg by mouth daily.     Marland Kitchen atorvastatin (LIPITOR) 40 MG tablet Take 40 mg by mouth daily.  1  . calcium carbonate (OS-CAL - DOSED IN MG OF ELEMENTAL CALCIUM) 1250 (500 Ca) MG tablet Take 1 tablet by mouth daily with breakfast.    . carvedilol (COREG) 12.5 MG tablet TAKE ONE TABLET BY MOUTH TWICE DAILY WITH A MEAL 60 tablet 11  . clobetasol cream (TEMOVATE) 0.05 %     . furosemide (LASIX) 40 MG tablet take 1 tablet by mouth once daily 30 tablet 9  . lisinopril (PRINIVIL,ZESTRIL) 10 MG tablet take 1 tablet by mouth once daily 30 tablet 7  . LORazepam (ATIVAN) 0.5 MG tablet Take 0.5 mg by mouth daily as needed for anxiety.     . Multiple Vitamin (MULTI-VITAMIN PO) Take 1 tablet by mouth daily.     . naproxen (NAPROSYN) 500 MG tablet Take 250 mg by mouth daily as needed for mild pain.     . nitroGLYCERIN (NITROSTAT) 0.4 MG SL tablet Place 1 tablet (0.4 mg total) under the tongue every 5 (five) minutes as needed for chest pain. 25 tablet 5  . Omega-3 Fatty Acids (FISH OIL) 1200 MG CAPS Take 1 capsule by mouth daily.    Marland Kitchen  pantoprazole (PROTONIX) 40 MG tablet Take 40 mg by mouth 2 (two) times daily.     . Venlafaxine HCl (EFFEXOR PO) Take 225 mg by mouth daily.    . Vitamin D, Ergocalciferol, (DRISDOL) 50000 UNITS CAPS Take 50,000 Units by mouth every 30 (thirty) days.       No results found for this or any previous visit (from the past 48 hour(s)). No results found.  ROS: Pain with rom of the right upper extremity  Physical Exam:  Alert and oriented 76 y.o. female in no acute distress Cranial nerves 2-12 intact Cervical spine: full rom with no tenderness, nv intact distally Chest: active breath sounds bilaterally, no wheeze rhonchi or rales Heart: regular rate and rhythm, no murmur Abd: non tender non  distended with active bowel sounds Hip is stable with rom  Right shoulder with limited rom and weakness nv intact distally Positive tinel's and phalen's signs for carpal tunnel No rashes or edema  Assessment/Plan Assessment: right rotator cuff arthropathy and carpal tunnel syndrome  Plan: Patient will undergo a right reverse total shoulder and carpal tunnel release by Dr. Veverly Fells at [facility]. Risks benefits and expectations were discussed with the patient. Patient understand risks, benefits and expectations and wishes to proceed.

## 2016-06-27 ENCOUNTER — Ambulatory Visit (INDEPENDENT_AMBULATORY_CARE_PROVIDER_SITE_OTHER): Payer: Medicare Other

## 2016-06-27 ENCOUNTER — Ambulatory Visit (INDEPENDENT_AMBULATORY_CARE_PROVIDER_SITE_OTHER): Payer: Medicare Other | Admitting: Family Medicine

## 2016-06-27 VITALS — BP 128/60 | HR 73 | Temp 99.7°F | Resp 18 | Ht 66.0 in | Wt 233.0 lb

## 2016-06-27 DIAGNOSIS — R05 Cough: Secondary | ICD-10-CM | POA: Diagnosis not present

## 2016-06-27 DIAGNOSIS — Z8701 Personal history of pneumonia (recurrent): Secondary | ICD-10-CM | POA: Diagnosis not present

## 2016-06-27 DIAGNOSIS — J111 Influenza due to unidentified influenza virus with other respiratory manifestations: Secondary | ICD-10-CM | POA: Diagnosis not present

## 2016-06-27 DIAGNOSIS — R059 Cough, unspecified: Secondary | ICD-10-CM

## 2016-06-27 LAB — POCT CBC
Granulocyte percent: 89.1 %G — AB (ref 37–80)
HCT, POC: 43.7 % (ref 37.7–47.9)
Hemoglobin: 15.6 g/dL (ref 12.2–16.2)
Lymph, poc: 0.9 (ref 0.6–3.4)
MCH, POC: 33.3 pg — AB (ref 27–31.2)
MCHC: 35.7 g/dL — AB (ref 31.8–35.4)
MCV: 93.4 fL (ref 80–97)
MID (cbc): 0.2 (ref 0–0.9)
MPV: 8.5 fL (ref 0–99.8)
POC Granulocyte: 9 — AB (ref 2–6.9)
POC LYMPH PERCENT: 8.8 %L — AB (ref 10–50)
POC MID %: 2.1 %M (ref 0–12)
Platelet Count, POC: 219 10*3/uL (ref 142–424)
RBC: 4.68 M/uL (ref 4.04–5.48)
RDW, POC: 12.9 %
WBC: 10.1 10*3/uL (ref 4.6–10.2)

## 2016-06-27 LAB — POCT INFLUENZA A/B
Influenza A, POC: NEGATIVE
Influenza B, POC: NEGATIVE

## 2016-06-27 MED ORDER — AZITHROMYCIN 250 MG PO TABS: ORAL_TABLET | ORAL | 0 refills | Status: DC

## 2016-06-27 NOTE — Patient Instructions (Addendum)
Your flu testing was normal, the radiologist did not see a pneumonia, but I do hear some possible early congestion on your exam. Your blood work indicated more of a possible bacteria infection as opposed to virus, so we'll start antibiotic to cover for possible early pneumonia. Mucinex if needed for cough, saline nasal spray as needed for nasal congestion, Cepacol over-the-counter for sore throat as needed, tylenol for body aches. If not improving in the next few days, or worsening, return for recheck.   Return to the clinic or go to the nearest emergency room if any of your symptoms worsen or new symptoms occur.   Cough, Adult Coughing is a reflex that clears your throat and your airways. Coughing helps to heal and protect your lungs. It is normal to cough occasionally, but a cough that happens with other symptoms or lasts a long time may be a sign of a condition that needs treatment. A cough may last only 2-3 weeks (acute), or it may last longer than 8 weeks (chronic). What are the causes? Coughing is commonly caused by:  Breathing in substances that irritate your lungs.  A viral or bacterial respiratory infection.  Allergies.  Asthma.  Postnasal drip.  Smoking.  Acid backing up from the stomach into the esophagus (gastroesophageal reflux).  Certain medicines.  Chronic lung problems, including COPD (or rarely, lung cancer).  Other medical conditions such as heart failure. Follow these instructions at home: Pay attention to any changes in your symptoms. Take these actions to help with your discomfort:  Take medicines only as told by your health care provider.  If you were prescribed an antibiotic medicine, take it as told by your health care provider. Do not stop taking the antibiotic even if you start to feel better.  Talk with your health care provider before you take a cough suppressant medicine.  Drink enough fluid to keep your urine clear or pale yellow.  If the air  is dry, use a cold steam vaporizer or humidifier in your bedroom or your home to help loosen secretions.  Avoid anything that causes you to cough at work or at home.  If your cough is worse at night, try sleeping in a semi-upright position.  Avoid cigarette smoke. If you smoke, quit smoking. If you need help quitting, ask your health care provider.  Avoid caffeine.  Avoid alcohol.  Rest as needed. Contact a health care provider if:  You have new symptoms.  You cough up pus.  Your cough does not get better after 2-3 weeks, or your cough gets worse.  You cannot control your cough with suppressant medicines and you are losing sleep.  You develop pain that is getting worse or pain that is not controlled with pain medicines.  You have a fever.  You have unexplained weight loss.  You have night sweats. Get help right away if:  You cough up blood.  You have difficulty breathing.  Your heartbeat is very fast. This information is not intended to replace advice given to you by your health care provider. Make sure you discuss any questions you have with your health care provider. Document Released: 12/05/2010 Document Revised: 11/14/2015 Document Reviewed: 08/15/2014 Elsevier Interactive Patient Education  2017 [employer].   IF you received an x-ray today, you will receive an invoice from [facility]. Please contact [facility] at [phone] with questions or concerns regarding your invoice.   IF you received labwork today, you will receive an invoice from [place]. Please contact  [facility] at [phone] with questions or concerns regarding your invoice.   Our billing staff will not be able to assist you with questions regarding bills from these companies.  You will be contacted with the lab results as soon as they are available. The fastest way to get your results is to activate your My Chart account. Instructions are located on the last page of this  paperwork. If you have not heard from [place] regarding the results in 2 weeks, please contact this office.

## 2016-06-27 NOTE — Progress Notes (Signed)
Subjective:  By signing my name below, I, Raven Small, attest that this documentation has been prepared under the direction and in the presence of Merri Ray, MD.  Electronically Signed: Thea Alken, ED Scribe. 06/27/2016. 10:22 AM.   Patient ID: Kristin Ina, Kristin Mack, female    DOB: [birth year], 76 y.o.   MRN: [id number]  HPI Chief Complaint  Patient presents with  . Flu like symptoms    HPI Comments: Kristin Ina, Kristin Mack is a 76 y.o. female who presents to the [facility] complaining of cough, congestion and body aches that began 1 day ago.  Pt states symptoms started with sore throat 2 days ago. She reports cough, congestion, body ache, HA and subjective fever, but did not measure a fever. She's taken benadryl and nyquil without relief. She reports hx of pneumonia in the 80's or 90's. She reports sick contact of her niece and son who had the flu. Pt reports having flu shot 3 months ago.    Patient Active Problem List   Diagnosis Date Noted  . Coronary artery disease involving native coronary artery of native heart without angina pectoris 05/27/2016  . Chronic systolic CHF (congestive heart failure) ([place]) 03/11/2011  . Hyperlipidemia 03/11/2011  . Carpal tunnel syndrome   . GERD (gastroesophageal reflux disease)    Past Medical History:  Diagnosis Date  . Carpal tunnel syndrome    Left Hand  . Chest pain   . CHF (congestive heart failure) (HCC)    Dilated cardiomyopathy. Her original ejection fraction was 10-20%. Her ejection fraction has improved to 57% by 2009  . Coronary artery disease   . Depression   . Dyslipidemia   . Foot drop, left   . GERD (gastroesophageal reflux disease)   . Headache(784.0)   . Hemorrhoids   . History of anxiety   . Hypertension   . Lower back pain   . Myocardial infarction   . Obesity   . Osteoarthritis    Severe with collapse of the left hip   Past Surgical History:  Procedure Laterality Date  . CARDIAC  CATHETERIZATION     Ejection Fraction was around 45-50%  . HYSTEROSCOPY  09/03/2011   Procedure: HYSTEROSCOPY;  Surgeon: Logan Bores, MD;  Location: [facility];  Service: Gynecology;  Laterality: N/A;  With Resection of polyp with truclear system  . REPLACEMENT TOTAL HIP W/  RESURFACING IMPLANTS  2002   left  . TUBAL LIGATION     Allergies  Allergen Reactions  . Nickel Itching and Rash   Prior to Admission medications   Medication Sig Start Date End Date Taking? Authorizing Provider  aspirin 81 MG tablet Take 81 mg by mouth daily.    Yes Historical Provider, MD  atorvastatin (LIPITOR) 40 MG tablet Take 40 mg by mouth daily. 10/29/14  Yes Historical Provider, MD  calcium carbonate (OS-CAL - DOSED IN MG OF ELEMENTAL CALCIUM) 1250 (500 Ca) MG tablet Take 1 tablet by mouth daily with breakfast.   Yes Historical Provider, MD  carvedilol (COREG) 12.5 MG tablet TAKE ONE TABLET BY MOUTH TWICE DAILY WITH A MEAL 12/31/15  Yes Thayer Headings, MD  clobetasol cream (TEMOVATE) 0.05 %  02/15/16  Yes Historical Provider, MD  furosemide (LASIX) 40 MG tablet take 1 tablet by mouth once daily 02/06/16  Yes Thayer Headings, MD  lisinopril (PRINIVIL,ZESTRIL) 10 MG tablet take 1 tablet by mouth once daily 04/27/16  Yes Thayer Headings, MD  LORazepam (ATIVAN)  0.5 MG tablet Take 0.5 mg by mouth daily as needed for anxiety.    Yes Historical Provider, MD  Multiple Vitamin (MULTI-VITAMIN PO) Take 1 tablet by mouth daily.    Yes Historical Provider, MD  naproxen (NAPROSYN) 500 MG tablet Take 250 mg by mouth daily as needed for mild pain.    Yes Historical Provider, MD  nitroGLYCERIN (NITROSTAT) 0.4 MG SL tablet Place 1 tablet (0.4 mg total) under the tongue every 5 (five) minutes as needed for chest pain. 09/30/15  Yes Thayer Headings, MD  Omega-3 Fatty Acids (FISH OIL) 1200 MG CAPS Take 1 capsule by mouth daily.   Yes Historical Provider, MD  pantoprazole (PROTONIX) 40 MG tablet Take 40 mg by mouth 2 (two) times daily.     Yes Historical Provider, MD  Venlafaxine HCl (EFFEXOR PO) Take 225 mg by mouth daily.   Yes Historical Provider, MD  Vitamin D, Ergocalciferol, (DRISDOL) 50000 UNITS CAPS Take 50,000 Units by mouth every 30 (thirty) days.  03/24/12  Yes Historical Provider, MD   Social History   Social History  . Marital status: Divorced    Spouse name: N/A  . Number of children: N/A  . Years of education: N/A   Occupational History  . Not on file.   Social History Main Topics  . Smoking status: Never Smoker  . Smokeless tobacco: Never Used  . Alcohol use No  . Drug use: No  . Sexual activity: Yes    Birth control/ protection: Surgical   Other Topics Concern  . Not on file   Social History Narrative  . No narrative on file   Review of Systems  Constitutional: Positive for fatigue.  HENT: Positive for congestion and sore throat.   Respiratory: Positive for cough.   Musculoskeletal: Positive for myalgias.  Neurological: Positive for headaches.       Objective:   Physical Exam  Constitutional: She is oriented to person, place, and time. She appears well-developed and well-nourished. No distress.  HENT:  Head: Normocephalic and atraumatic.  Mouth/Throat: No oropharyngeal exudate or posterior oropharyngeal erythema.  Eyes: EOM are normal. Right conjunctiva has a hemorrhage ( small).  Neck: Neck supple.  Cardiovascular: Normal rate, regular rhythm and normal heart sounds.   No murmur heard. Pulmonary/Chest: Effort normal. She has no rhonchi.  Scattered coarse breath sounds lower lobes bilaterally   Musculoskeletal: Normal range of motion.  Neurological: She is alert and oriented to person, place, and time.  Skin: Skin is warm and dry.  Psychiatric: She has a normal mood and affect. Her behavior is normal.  Nursing note and vitals reviewed.   Vitals:   06/27/16 0926  BP: 128/60  Pulse: 73  Resp: 18  Temp: 99.7 F (37.6 C)  TempSrc: Oral  SpO2: 96%  Weight: 233 lb (105.7 kg)   Height: 5\' 6"  (1.676 m)   Dg Chest 2 View  Result Date: 06/27/2016 CLINICAL DATA:  Flu symptoms for 2-3 days. EXAM: CHEST  2 VIEW COMPARISON:  08/21/2015 FINDINGS: The heart size and mediastinal contours are within normal limits. Both lungs are clear. No pleural effusion or pneumothorax. Skeletal structures are intact. IMPRESSION: No active cardiopulmonary disease. Electronically Signed   By: Lajean Manes M.D.   On: 06/27/2016 10:59   Results for orders placed or performed in visit on 06/27/16  POCT Influenza A/B  Result Value Ref Range   Influenza A, POC Negative Negative   Influenza B, POC Negative Negative  POCT CBC  Result  Value Ref Range   WBC 10.1 4.6 - 10.2 K/uL   Lymph, poc 0.9 0.6 - 3.4   POC LYMPH PERCENT 8.8 (A) 10 - 50 %L   MID (cbc) 0.2 0 - 0.9   POC MID % 2.1 0 - 12 %M   POC Granulocyte 9.0 (A) 2 - 6.9   Granulocyte percent 89.1 (A) 37 - 80 %G   RBC 4.68 4.04 - 5.48 M/uL   Hemoglobin 15.6 12.2 - 16.2 g/dL   HCT, POC 43.7 37.7 - 47.9 %   MCV 93.4 80 - 97 fL   MCH, POC 33.3 (A) 27 - 31.2 pg   MCHC 35.7 (A) 31.8 - 35.4 g/dL   RDW, POC 12.9 %   Platelet Count, POC 219 142 - 424 K/uL   MPV 8.5 0 - 99.8 fL   Assessment & Plan:    Kristin Ina, Kristin Mack is a 76 y.o. female Cough - Plan: DG Chest 2 View, POCT Influenza A/B, POCT CBC, azithromycin (ZITHROMAX) 250 MG tablet  History of bacterial pneumonia - Plan: DG Chest 2 View, azithromycin (ZITHROMAX) 250 MG tablet  Initially suspected possible influenza, but with negative flu testing, CBC with left shift, and a few rhonchi on chest exam, early community-acquired pneumonia possible. X-ray without acute infiltrate today.  -Symptomatic care discussed, start azithromycin, RTC precautions.  Meds ordered this encounter  Medications  . azithromycin (ZITHROMAX) 250 MG tablet    Sig: Take 2 pills by mouth on day 1, then 1 pill by mouth per day on days 2 through 5.    Dispense:  6 tablet    Refill:  0   Patient  Instructions    Your flu testing was normal, the radiologist did not see a pneumonia, but I do hear some possible early congestion on your exam. Your blood work indicated more of a possible bacteria infection as opposed to virus, so we'll start antibiotic to cover for possible early pneumonia. Mucinex if needed for cough, saline nasal spray as needed for nasal congestion, Cepacol over-the-counter for sore throat as needed, tylenol for body aches. If not improving in the next few days, or worsening, return for recheck.   Return to the clinic or go to the nearest emergency room if any of your symptoms worsen or new symptoms occur.   Cough, Adult Coughing is a reflex that clears your throat and your airways. Coughing helps to heal and protect your lungs. It is normal to cough occasionally, but a cough that happens with other symptoms or lasts a long time may be a sign of a condition that needs treatment. A cough may last only 2-3 weeks (acute), or it may last longer than 8 weeks (chronic). What are the causes? Coughing is commonly caused by:  Breathing in substances that irritate your lungs.  A viral or bacterial respiratory infection.  Allergies.  Asthma.  Postnasal drip.  Smoking.  Acid backing up from the stomach into the esophagus (gastroesophageal reflux).  Certain medicines.  Chronic lung problems, including COPD (or rarely, lung cancer).  Other medical conditions such as heart failure. Follow these instructions at home: Pay attention to any changes in your symptoms. Take these actions to help with your discomfort:  Take medicines only as told by your health care provider.  If you were prescribed an antibiotic medicine, take it as told by your health care provider. Do not stop taking the antibiotic even if you start to feel better.  Talk with your health care  provider before you take a cough suppressant medicine.  Drink enough fluid to keep your urine clear or pale  yellow.  If the air is dry, use a cold steam vaporizer or humidifier in your bedroom or your home to help loosen secretions.  Avoid anything that causes you to cough at work or at home.  If your cough is worse at night, try sleeping in a semi-upright position.  Avoid cigarette smoke. If you smoke, quit smoking. If you need help quitting, ask your health care provider.  Avoid caffeine.  Avoid alcohol.  Rest as needed. Contact a health care provider if:  You have new symptoms.  You cough up pus.  Your cough does not get better after 2-3 weeks, or your cough gets worse.  You cannot control your cough with suppressant medicines and you are losing sleep.  You develop pain that is getting worse or pain that is not controlled with pain medicines.  You have a fever.  You have unexplained weight loss.  You have night sweats. Get help right away if:  You cough up blood.  You have difficulty breathing.  Your heartbeat is very fast. This information is not intended to replace advice given to you by your health care provider. Make sure you discuss any questions you have with your health care provider. Document Released: 12/05/2010 Document Revised: 11/14/2015 Document Reviewed: 08/15/2014 Elsevier Interactive Patient Education  2017 [employer].   IF you received an x-ray today, you will receive an invoice from [facility]. Please contact [facility] at [phone] with questions or concerns regarding your invoice.   IF you received labwork today, you will receive an invoice from [place]. Please contact [facility] at [phone] with questions or concerns regarding your invoice.   Our billing staff will not be able to assist you with questions regarding bills from these companies.  You will be contacted with the lab results as soon as they are available. The fastest way to get your results is to activate your My Chart account. Instructions are located on the  last page of this paperwork. If you have not heard from [place] regarding the results in 2 weeks, please contact this office.       I personally performed the services described in this documentation, which was scribed in my presence. The recorded information has been reviewed and considered, and addended by me as needed.   Signed,   Merri Ray, MD [facility] [place].  06/27/16 11:11 AM

## 2016-06-28 ENCOUNTER — Other Ambulatory Visit: Payer: Self-pay | Admitting: Family Medicine

## 2016-06-28 DIAGNOSIS — R059 Cough, unspecified: Secondary | ICD-10-CM

## 2016-06-28 DIAGNOSIS — Z8701 Personal history of pneumonia (recurrent): Secondary | ICD-10-CM

## 2016-06-28 DIAGNOSIS — R05 Cough: Secondary | ICD-10-CM

## 2016-06-29 ENCOUNTER — Other Ambulatory Visit: Payer: Self-pay

## 2016-06-29 NOTE — Telephone Encounter (Signed)
SS refill req Azithromycin

## 2016-06-29 NOTE — Telephone Encounter (Signed)
SS refill req for zithromax 250  1 day after being prescribed. IC pt- LMOVM for pt to call office & explain Refused med refill

## 2016-06-30 ENCOUNTER — Telehealth: Payer: Self-pay

## 2016-07-01 ENCOUNTER — Ambulatory Visit: Payer: Medicare Other | Admitting: Physician Assistant

## 2016-07-10 ENCOUNTER — Inpatient Hospital Stay: Admit: 2016-07-10 | Payer: Medicare Other | Admitting: Orthopedic Surgery

## 2016-07-10 SURGERY — ARTHROPLASTY, SHOULDER, TOTAL, REVERSE
Anesthesia: General | Laterality: Right

## 2016-08-20 ENCOUNTER — Other Ambulatory Visit: Payer: Self-pay | Admitting: Obstetrics and Gynecology

## 2016-08-20 DIAGNOSIS — Z1231 Encounter for screening mammogram for malignant neoplasm of breast: Secondary | ICD-10-CM

## 2016-08-21 DIAGNOSIS — T1490XA Injury, unspecified, initial encounter: Secondary | ICD-10-CM | POA: Insufficient documentation

## 2016-09-01 ENCOUNTER — Telehealth: Payer: Self-pay | Admitting: Cardiovascular Disease

## 2016-09-01 ENCOUNTER — Encounter: Payer: Self-pay | Admitting: Podiatry

## 2016-09-01 ENCOUNTER — Ambulatory Visit (INDEPENDENT_AMBULATORY_CARE_PROVIDER_SITE_OTHER): Payer: Medicare Other | Admitting: Podiatry

## 2016-09-01 DIAGNOSIS — M79676 Pain in unspecified toe(s): Secondary | ICD-10-CM

## 2016-09-01 DIAGNOSIS — B351 Tinea unguium: Secondary | ICD-10-CM | POA: Diagnosis not present

## 2016-09-01 NOTE — Telephone Encounter (Signed)
New Message   Pt would like to know if she should have a prescription for Penicillin before her oral surgery tomorrow. She says she is not currently prescribed this but wants to know if it will help her surgery. Requests a call back and states this is not a surgical clearance question.

## 2016-09-01 NOTE — Telephone Encounter (Signed)
Informed patient she did not need abx prior to oral surgery tomorrow. Pt is very appreciative for calling her back so quickly. Patient verbalized understanding and agreeable to plan.

## 2016-09-01 NOTE — Progress Notes (Signed)
She presents today with a chief complaint of painful elongated toenails 1 through 5 bilateral.  Objective: Vital signs are stable alert and oriented 3 pulses are palpable. Neurologic sensorium is intact. Deep tendon reflexes are intact. Muscle strength +5 over 5 dorsiflexion plantar flexors and inverters everters toenails are thick yellow dystrophic onychomycotic painful on palpation as well as debridement.  Assessment: Pain and limp secondary to onychomycosis.  Plan: Debridement of toenails 1 through 5 bilateral.

## 2016-09-21 ENCOUNTER — Ambulatory Visit: Payer: Medicare Other

## 2016-11-16 ENCOUNTER — Other Ambulatory Visit: Payer: Self-pay | Admitting: Cardiovascular Disease

## 2016-11-23 ENCOUNTER — Other Ambulatory Visit: Payer: Self-pay | Admitting: Cardiovascular Disease

## 2016-12-01 ENCOUNTER — Encounter: Payer: Self-pay | Admitting: Podiatry

## 2016-12-01 ENCOUNTER — Ambulatory Visit (INDEPENDENT_AMBULATORY_CARE_PROVIDER_SITE_OTHER): Payer: Medicare Other | Admitting: Podiatry

## 2016-12-01 VITALS — BP 97/65 | HR 75 | Resp 16

## 2016-12-01 DIAGNOSIS — M79676 Pain in unspecified toe(s): Secondary | ICD-10-CM | POA: Diagnosis not present

## 2016-12-01 DIAGNOSIS — B351 Tinea unguium: Secondary | ICD-10-CM | POA: Diagnosis not present

## 2016-12-01 NOTE — Progress Notes (Signed)
She presents today with a chief complaint of painful elongated toenails. States that the callus and ulcer has gone on to heal up.  Objective: Vital signs are stable she is alert and oriented 3 no open lesions or wounds. Pulses remain palpable. Her toenails are long thick yellow dystrophic onychomycotic and painful on palpation.  Assessment: Pain in limb secondary to onychomycosis.  Plan: Debridement of toenails bilateral.

## 2016-12-11 ENCOUNTER — Ambulatory Visit (HOSPITAL_COMMUNITY): Payer: Medicare Other | Admitting: Psychiatry

## 2017-01-12 ENCOUNTER — Ambulatory Visit
Admission: RE | Admit: 2017-01-12 | Discharge: 2017-01-12 | Disposition: A | Discharge status: Home / Self Care | Payer: Medicare Other | Source: Ambulatory Visit | Attending: Obstetrics and Gynecology | Admitting: Obstetrics and Gynecology

## 2017-01-12 DIAGNOSIS — Z1231 Encounter for screening mammogram for malignant neoplasm of breast: Secondary | ICD-10-CM

## 2017-01-14 ENCOUNTER — Other Ambulatory Visit: Payer: Self-pay | Admitting: Cardiovascular Disease

## 2017-02-11 ENCOUNTER — Telehealth: Payer: Self-pay | Admitting: Cardiovascular Disease

## 2017-02-11 NOTE — Telephone Encounter (Signed)
1. What dental office are you calling from? [facility]  2. What is your office phone and fax number? Office # [phone]  3. What type of procedure is the patient having performed? Prophylaxis  4. What date is procedure scheduled? 02-11-17  5. What is your question (ex. Antibiotics prior to procedure, holding medication-we need to know how long dentist wants pt to hold med)? Will patient need pre-medication?

## 2017-02-11 NOTE — Telephone Encounter (Signed)
Informed patient she did not need abx - from prior phone note.  [facility] aware.

## 2017-02-18 ENCOUNTER — Telehealth: Payer: Self-pay | Admitting: Cardiovascular Disease

## 2017-02-18 NOTE — Telephone Encounter (Signed)
New message    Pt is calling to find out if she needs labs before appt on Tuesday. Please call.

## 2017-02-18 NOTE — Telephone Encounter (Signed)
Pt called to see if she needs to have blood work prior coming to her appointment with DR. Nahser on 9/4 at 8:00 AM. According to labs reports pt has been having Cholesterol and CMET every 6 months. Pt was made aware that MD may order other labs after assessing her. Pt was advised to come to the appointment fasting so MD can order the  labs needed for her after appointment. Pt verbalized understanding.

## 2017-02-23 ENCOUNTER — Ambulatory Visit (INDEPENDENT_AMBULATORY_CARE_PROVIDER_SITE_OTHER): Payer: Medicare Other | Admitting: Cardiovascular Disease

## 2017-02-23 ENCOUNTER — Encounter: Payer: Self-pay | Admitting: Nurse Practitioner

## 2017-02-23 ENCOUNTER — Encounter: Payer: Self-pay | Admitting: Cardiovascular Disease

## 2017-02-23 VITALS — BP 120/68 | HR 69 | Ht 66.5 in | Wt 235.4 lb

## 2017-02-23 DIAGNOSIS — I5022 Chronic systolic (congestive) heart failure: Secondary | ICD-10-CM

## 2017-02-23 DIAGNOSIS — E782 Mixed hyperlipidemia: Secondary | ICD-10-CM

## 2017-02-23 DIAGNOSIS — I2510 Atherosclerotic heart disease of native coronary artery without angina pectoris: Secondary | ICD-10-CM

## 2017-02-23 LAB — HEPATIC FUNCTION PANEL
ALT: 14 IU/L (ref 0–32)
AST: 20 IU/L (ref 0–40)
Albumin: 4.2 g/dL (ref 3.5–4.8)
Alkaline Phosphatase: 102 IU/L (ref 39–117)
Bilirubin Total: 0.7 mg/dL (ref 0.0–1.2)
Bilirubin, Direct: 0.16 mg/dL (ref 0.00–0.40)
Total Protein: 6.9 g/dL (ref 6.0–8.5)

## 2017-02-23 LAB — BASIC METABOLIC PANEL
BUN/Creatinine Ratio: 22 (ref 12–28)
BUN: 19 mg/dL (ref 8–27)
CO2: 24 mmol/L (ref 20–29)
Calcium: 9.3 mg/dL (ref 8.7–10.3)
Chloride: 101 mmol/L (ref 96–106)
Creatinine, Ser: 0.87 mg/dL (ref 0.57–1.00)
GFR calc Af Amer: 76 mL/min/{1.73_m2} (ref >59)
GFR calc non Af Amer: 66 mL/min/{1.73_m2} (ref >59)
Glucose: 94 mg/dL (ref 65–99)
Potassium: 4.6 mmol/L (ref 3.5–5.2)
Sodium: 142 mmol/L (ref 134–144)

## 2017-02-23 LAB — LIPID PANEL
Chol/HDL Ratio: 2.8 ratio (ref 0.0–4.4)
Cholesterol, Total: 157 mg/dL (ref 100–199)
HDL: 56 mg/dL (ref >39)
LDL Calculated: 82 mg/dL (ref 0–99)
Triglycerides: 95 mg/dL (ref 0–149)
VLDL Cholesterol Cal: 19 mg/dL (ref 5–40)

## 2017-02-23 NOTE — Progress Notes (Signed)
Cardiology Office Note   Date:  02/23/2017   ID:  Kristin Ina, PhD, DOB December 01, 1940, MRN [id number]  PCP:  Leanna Battles, MD  Cardiologist:   Mertie Moores, MD   Chief Complaint  Patient presents with  . Follow-up    CAD   1. CHF- LV EF has improved dramatically 2. Hyperlipidemia 3. Left hip replacement  History of Present Illness:  76 year old female with history of congestive heart failure. Her original ejection fraction was 10-20%. Her ejection fraction has dramatically improved and is now 57% ( Echo 2009). She had an echocardiogram in February 2011 that revealed normal left ventricle systolic function.  She's had 2 heart catheterizations. Her last cardiac cath was in 2006 which revealed moderate irregularities.  She's not having any episodes of chest pain or shortness breath. She works out a regular basis at least 2-3 times a week.  She has gained 20 lbs over the past several months. She has a foot ulcer in her left foot and has not been in the pool.  She's not had any angina-like chest pain. She did have some stress-related chest discomfort around Christmastime but that has since resolved.  October 18, 2012:  [name] has done well for the past 6 months. She has not had any dyspnea. She has had lots of left hip pain and has not been able to exercise as much. She is swimming 4 times a week.  Nov. 24, 2014:  Kristin Mack is doing well. She has been having some acid reflux ( dry cough).  She has been trying eating smaller portions, earing earlier in the evening, avoiding spicy foods.   May 27,2015: Kristin Mack is doing well. No CP or dyspnea.  Stressed out over family issues. Her sons were living with her for several months. She has gone up on her Effexor.  She has had some left thigh problems.   Dec. 1, 2015:  Kristin Mack is seen today for follow up of her CHF. She has done well.  She has not been swimming as much recently - has an ulcer on the bottom  of her left great toe.   Oct 30, 2014:  Kristin Ina, PhD is a 76 y.o. female who presents for  Follow up of her CHF. Is getting over a cold. Has gained some weight.  Is back exercising.  Does water exercises.  Likes to go line dancing .   No CP , breathing is ok.  Has lots of stress with her 50 yo son.   Nov. 8,  2016:  Doing well from a cardiac standpoint.  Lots of issues with GERD. Lots of stress ,    No Cp or dyspnea.   November 22, 2015:  Doing well.  Has gained 13 lbs since her last OV in Nov.  She is very frustrated with her 33 year old son who still lives in the house with her.   Breathing is good.   Swim 3 times  a week.    Dec. 6, 2017  Kristin Mack is seen today for a preoperative evaluation. She needs to have a right shoulder surgery and right carpal tunnel surgery.  She is doing well from a cardiac standpoint.   Has brief Chest pain / left shoulder pain .    Pains last for 2-3 minutes.   Not related to exercise - may occur after swimming.    Sept, 4, 2018:  Kristin Mack is seen today for follow up of her CAD Breathing is good .  Has been eating a bit of extra salt.   Going to the dentist  Myoview study in Dec. 2017 shows a fixed apical defect thought to be breast attenuation.  Has not had any cp. Did not have the shoulder surgery    Past Medical History:  Diagnosis Date  . Carpal tunnel syndrome    Left Hand  . Chest pain   . CHF (congestive heart failure) (HCC)    Dilated cardiomyopathy. Her original ejection fraction was 10-20%. Her ejection fraction has improved to 57% by 2009  . Coronary artery disease   . Depression   . Dyslipidemia   . Foot drop, left   . GERD (gastroesophageal reflux disease)   . Headache(784.0)   . Hemorrhoids   . History of anxiety   . Hypertension   . Lower back pain   . Myocardial infarction ([place])   . Obesity   . Osteoarthritis    Severe with collapse of the left hip    Past Surgical History:  Procedure Laterality Date    . BREAST BIOPSY Right    x2  . CARDIAC CATHETERIZATION     Ejection Fraction was around 45-50%  . HYSTEROSCOPY  09/03/2011   Procedure: HYSTEROSCOPY;  Surgeon: Logan Bores, MD;  Location: [facility];  Service: Gynecology;  Laterality: N/A;  With Resection of polyp with truclear system  . REPLACEMENT TOTAL HIP W/  RESURFACING IMPLANTS  2002   left  . TUBAL LIGATION       Current Outpatient Prescriptions  Medication Sig Dispense Refill  . aspirin 81 MG tablet Take 81 mg by mouth daily.     Marland Kitchen atorvastatin (LIPITOR) 40 MG tablet Take 40 mg by mouth daily.  1  . calcium carbonate (OS-CAL - DOSED IN MG OF ELEMENTAL CALCIUM) 1250 (500 Ca) MG tablet Take 1 tablet by mouth daily with breakfast.    . carvedilol (COREG) 12.5 MG tablet TAKE ONE TABLET BY MOUTH TWICE DAILY WITH A MEAL 180 tablet 1  . clobetasol cream (TEMOVATE) 8.54 % Apply 1 application topically 2 (two) times daily.     . furosemide (LASIX) 40 MG tablet take 1 tablet by mouth once daily 30 tablet 6  . latanoprost (XALATAN) 0.005 % ophthalmic solution Place 1 drop into both eyes at bedtime.  0  . lisinopril (PRINIVIL,ZESTRIL) 10 MG tablet take 1 tablet by mouth once daily 30 tablet 6  . LORazepam (ATIVAN) 0.5 MG tablet Take 0.5 mg by mouth daily as needed for anxiety.     . Multiple Vitamin (MULTI-VITAMIN PO) Take 1 tablet by mouth daily.     . naproxen (NAPROSYN) 500 MG tablet Take 250 mg by mouth daily as needed for mild pain.     . nitroGLYCERIN (NITROSTAT) 0.4 MG SL tablet Place 1 tablet (0.4 mg total) under the tongue every 5 (five) minutes as needed for chest pain. 25 tablet 3  . Omega-3 Fatty Acids (FISH OIL) 1200 MG CAPS Take 1 capsule by mouth daily.    . pantoprazole (PROTONIX) 40 MG tablet Take 40 mg by mouth 2 (two) times daily.     Marland Kitchen PRESCRIPTION MEDICATION Place 1 mL vaginally as directed. inser 1 ml vaginally 2-3 times per week as directed  Estradiol Micronized  0.02% cream (is compounded for patient)    .  Venlafaxine HCl (EFFEXOR PO) Take 225 mg by mouth daily.    . Vitamin D, Ergocalciferol, (DRISDOL) 50000 UNITS CAPS Take 50,000 Units by mouth every 30 (thirty) days.  No current facility-administered medications for this visit.     Allergies:   Nickel    Social History:  The patient  reports that she has never smoked. She has never used smokeless tobacco. She reports that she does not drink alcohol or use drugs.   Family History:  The patient's family history includes Cancer in her sister; Stroke in her maternal grandfather and mother.    ROS:  Please see the history of present illness.    Review of Systems: Constitutional:  denies fever, chills, diaphoresis, appetite change and fatigue.  HEENT: denies photophobia, eye pain, redness, hearing loss, ear pain, congestion, sore throat, rhinorrhea, sneezing, neck pain, neck stiffness and tinnitus.  Respiratory: denies SOB, DOE, cough, chest tightness, and wheezing.  Cardiovascular: denies chest pain, palpitations and leg swelling.  Gastrointestinal: denies nausea, vomiting, abdominal pain, diarrhea, constipation, blood in stool.  Genitourinary: denies dysuria, urgency, frequency, hematuria, flank pain and difficulty urinating.  Musculoskeletal: denies  myalgias, back pain, joint swelling, arthralgias and gait problem.   Skin: denies pallor, rash and wound.  Neurological: denies dizziness, seizures, syncope, weakness, light-headedness, numbness and headaches.   Hematological: denies adenopathy, easy bruising, personal or family bleeding history.  Psychiatric/ Behavioral: denies suicidal ideation, mood changes, confusion, nervousness, sleep disturbance and agitation.       All other systems are reviewed and negative.    PHYSICAL EXAM: VS:  BP 120/68   Pulse 69   Ht 5' 6.5" (1.689 m)   Wt 235 lb 6.4 oz (106.8 kg)   BMI 37.43 kg/m  , BMI Body mass index is 37.43 kg/m. GEN: Well nourished, well developed, in no acute distress    HEENT: normal  Neck: no JVD, carotid bruits, or masses Cardiac: RR, normal S1S2,  Faint heart sounds ; no significant murmur, rubs, or gallops,trace bilateral edema  Respiratory:  clear to auscultation bilaterally, normal work of breathing GI: soft, nontender, nondistended, + BS MS: no deformity or atrophy  Skin: warm and dry, no rash Neuro:  Strength and sensation are intact Psych: normal   EKG:  EKG is ordered today.  Sept. 4, 2018:   NSR at 69.   , NS ST abn.    Recent Labs: 05/27/2016: ALT 18; BUN 25; Creat 0.85; Potassium 5.5; Sodium 137 06/27/2016: Hemoglobin 15.6    Lipid Panel    Component Value Date/Time   CHOL 185 05/27/2016 1044   TRIG 116 05/27/2016 1044   HDL 68 05/27/2016 1044   CHOLHDL 2.7 05/27/2016 1044   VLDL 23 05/27/2016 1044   LDLCALC 94 05/27/2016 1044      Wt Readings from Last 3 Encounters:  02/23/17 235 lb 6.4 oz (106.8 kg)  06/27/16 233 lb (105.7 kg)  06/01/16 230 lb (104.3 kg)      Other studies Reviewed: Additional studies/ records that were reviewed today include: . Review of the above records demonstrates:    ASSESSMENT AND PLAN:  1. Chronic systolic CHF- LV EF has improved dramatically -  No CHF symptoms.  2. CAD :  Has known moderate  coronary artery disease. She has not been having any angina. Continue ASA and atorvastatin. Needs to exercise   3. Hyperlipidemia -   Will check labs today .  Last cholesterol levels are very well-controlled.  3. Left hip replacement  4. Weight gain :   Encouraged her to exercise more and to cut back on her portions.   5.  Mild aortic stenosis:  Has mild AS by echo in 2014.  Does not have a significant murmur. She does not need SBE prophylaxis for this ( her dentist had ask if she needed SBE prophylaxis) we have written a letter stating that she does not need to take ABX from a cardiac standpoint .   She has an old hip replacement       Current medicines are reviewed at length with the  patient today.  The patient does not have concerns regarding medicines.  The following changes have been made:  no change  Labs/ tests ordered today include:  No orders of the defined types were placed in this encounter.   Disposition:   FU with me in 6 months      Mertie Moores, MD  02/23/2017 8:41 AM    [facility] [address], [place], [place]  [address] Phone: [phone]; Fax: [phone]

## 2017-02-23 NOTE — Patient Instructions (Signed)
Medication Instructions:  Your physician recommends that you continue on your current medications as directed. Please refer to the Current Medication list given to you today.   Labwork: TODAY - cholesterol, basic metabolic panel, liver panel   Testing/Procedures: None Ordered   Follow-Up: Your physician wants you to follow-up in: 6 months with Dr. Nahser.  You will receive a reminder letter in the mail two months in advance. If you don't receive a letter, please call our office to schedule the follow-up appointment.   If you need a refill on your cardiac medications before your next appointment, please call your pharmacy.   Thank you for choosing [facility]! Michelle Swinyer, RN [phone]    

## 2017-03-04 ENCOUNTER — Ambulatory Visit: Payer: Medicare Other | Admitting: Podiatry

## 2017-03-16 ENCOUNTER — Ambulatory Visit (INDEPENDENT_AMBULATORY_CARE_PROVIDER_SITE_OTHER): Payer: Medicare Other | Admitting: Podiatry

## 2017-03-16 ENCOUNTER — Encounter: Payer: Self-pay | Admitting: Podiatry

## 2017-03-16 DIAGNOSIS — M79676 Pain in unspecified toe(s): Secondary | ICD-10-CM | POA: Diagnosis not present

## 2017-03-16 DIAGNOSIS — B351 Tinea unguium: Secondary | ICD-10-CM

## 2017-03-16 DIAGNOSIS — L97521 Non-pressure chronic ulcer of other part of left foot limited to breakdown of skin: Secondary | ICD-10-CM

## 2017-03-17 NOTE — Progress Notes (Signed)
She presents today with a chief complaint of painful elongated toenails and the superficial ulcer to the hallux left.  Objective: Vital signs are stable she is alert and oriented 3. Pulses are palpable. Reactive hyperkeratosis hallux left has been debrided and demonstrates a 2 x 2 millimeters superficial ulceration just limited to skin breakdown. At this point her nails are thick yellow dystrophic onychomycotic.  Assessment: Diabetic ulceration hallux left. Pain limb secondary to onychomycosis.  Plan: Debridement of toenails and reactive hyperkeratosis bilateral. She will continue to treat the hallux ulcer left that she has been obviously it is healing very nicely at this point. Should this become erythematous drain or become painful she will notify us immediately. Otherwise I'll follow-up with her regular scheduled

## 2017-05-05 DIAGNOSIS — J209 Acute bronchitis, unspecified: Secondary | ICD-10-CM | POA: Insufficient documentation

## 2017-05-11 DIAGNOSIS — Z0000 Encounter for general adult medical examination without abnormal findings: Secondary | ICD-10-CM | POA: Insufficient documentation

## 2017-06-17 ENCOUNTER — Ambulatory Visit: Payer: Medicare Other | Admitting: Podiatry

## 2017-06-24 ENCOUNTER — Ambulatory Visit (INDEPENDENT_AMBULATORY_CARE_PROVIDER_SITE_OTHER): Payer: Medicare Other | Admitting: Podiatry

## 2017-06-24 ENCOUNTER — Encounter: Payer: Self-pay | Admitting: Podiatry

## 2017-06-24 ENCOUNTER — Other Ambulatory Visit: Payer: Self-pay | Admitting: Cardiovascular Disease

## 2017-06-24 DIAGNOSIS — L97521 Non-pressure chronic ulcer of other part of left foot limited to breakdown of skin: Secondary | ICD-10-CM

## 2017-06-24 DIAGNOSIS — B351 Tinea unguium: Secondary | ICD-10-CM

## 2017-06-24 DIAGNOSIS — M79676 Pain in unspecified toe(s): Secondary | ICD-10-CM

## 2017-06-26 NOTE — Progress Notes (Signed)
She presents today for follow-up of an ulceration of the hallux left.  She states that she continues to keep it dressed.  She also has painful elongated toenails.  Objective: Vital signs are stable she is alert and oriented x3 there is no erythema edema cellulitis drainage or odor reactive hyperkeratosis around the very small 4 mm ulceration to distal aspect of the hallux left.  There is no signs of infection associated with this.  However her toenails are thick yellow dystrophic click mycotic painful on palpation as well as debridement.  Assessment: Pain in limb secondary to onychomycosis and ulceration hallux left.  Plan: Debridement of all reactive hyperkeratotic tissue to bleeding place padding and dressing today for the toe.  Debrided toenails 1 through 5 bilateral.

## 2017-08-16 ENCOUNTER — Other Ambulatory Visit: Payer: Self-pay | Admitting: Cardiovascular Disease

## 2017-08-16 MED ORDER — FUROSEMIDE 40 MG PO TABS: 40.0000 mg | ORAL_TABLET | Freq: Every day | ORAL | 6 refills | Status: DC

## 2017-09-23 ENCOUNTER — Ambulatory Visit: Payer: Medicare Other | Admitting: Podiatry

## 2017-09-23 DIAGNOSIS — M79676 Pain in unspecified toe(s): Secondary | ICD-10-CM

## 2017-09-23 DIAGNOSIS — B351 Tinea unguium: Secondary | ICD-10-CM | POA: Diagnosis not present

## 2017-09-23 DIAGNOSIS — L89892 Pressure ulcer of other site, stage 2: Secondary | ICD-10-CM | POA: Diagnosis not present

## 2017-09-23 NOTE — Progress Notes (Signed)
She presents today chief complaint of painful elongated toenails and a chronic ulceration to the distal aspect of the hallux left.  She denies fever chills nausea vomiting muscle aches and pains and states that the wound is stable and she continues to dress it on a regular basis.  Objective: Vital signs are stable she is alert and oriented x3 pulses are palpable.  Dropfoot left foot with contracted digital deformities resulting in superficial ulceration measuring less than 4 mm in diameter to the distal medial aspect of the hallux left.  No erythema cellulitis drainage or odor.  Reactive hyperkeratosis is present I debrided to bleeding today.  No signs of infection and does not probe to bone.  Toenails are long thick yellow dystrophic-like mycotic and painful on palpation.  Assessment: Chronic ulceration hallux left.  Pain in limb secondary to onychomycosis.  Contracted deformities left foot.  Plan: Discussed etiology pathology conservative versus surgical therapies.  At this point suggested that we may perform a long flexor tenotomy to the left hallux.  I told her to think about this and let me know next visit.  We also debrided toenails 1 through 5 bilateral.  I also debrided the reactive hyperkeratotic lesion in the ulceration to bleeding today placed Silvadene cream Telfa pad a dry sterile compressive dressing.  She will continue to dress daily.  Follow-up with me in 3-4 months.  She will call sooner with questions or concerns.  I also dispensed a silicone toe pads.

## 2017-10-19 ENCOUNTER — Other Ambulatory Visit: Payer: Self-pay | Admitting: Cardiovascular Disease

## 2017-11-03 DIAGNOSIS — L97522 Non-pressure chronic ulcer of other part of left foot with fat layer exposed: Secondary | ICD-10-CM | POA: Insufficient documentation

## 2017-11-08 ENCOUNTER — Ambulatory Visit: Payer: Medicare Other | Admitting: Cardiovascular Disease

## 2017-11-08 ENCOUNTER — Encounter: Payer: Self-pay | Admitting: Cardiovascular Disease

## 2017-11-08 VITALS — BP 102/62 | HR 72 | Ht 66.6 in | Wt 230.0 lb

## 2017-11-08 DIAGNOSIS — I2510 Atherosclerotic heart disease of native coronary artery without angina pectoris: Secondary | ICD-10-CM

## 2017-11-08 DIAGNOSIS — L97521 Non-pressure chronic ulcer of other part of left foot limited to breakdown of skin: Secondary | ICD-10-CM

## 2017-11-08 DIAGNOSIS — E782 Mixed hyperlipidemia: Secondary | ICD-10-CM

## 2017-11-08 NOTE — Progress Notes (Signed)
Cardiology Office Note   Date:  11/08/2017   ID:  Kristin Ina, PhD, DOB 05/09/1941, MRN [id number]  PCP:  Leanna Battles, MD  Cardiologist:   Mertie Moores, MD   Chief Complaint  Patient presents with  . Coronary Artery Disease   1. CHF- LV EF has improved dramatically 2. Hyperlipidemia 3. Left hip replacement   77 year old female with history of congestive heart failure. Her original ejection fraction was 10-20%. Her ejection fraction has dramatically improved and is now 57% ( Echo 2009). She had an echocardiogram in February 2011 that revealed normal left ventricle systolic function.  She's had 2 heart catheterizations. Her last cardiac cath was in 2006 which revealed moderate irregularities.  She's not having any episodes of chest pain or shortness breath. She works out a regular basis at least 2-3 times a week.  She has gained 20 lbs over the past several months. She has a foot ulcer in her left foot and has not been in the pool.  She's not had any angina-like chest pain. She did have some stress-related chest discomfort around Christmastime but that has since resolved.  October 18, 2012:  Kristin Mack has done well for the past 6 months. She has not had any dyspnea. She has had lots of left hip pain and has not been able to exercise as much. She is swimming 4 times a week.  Nov. 24, 2014:  Kristin Mack is doing well. She has been having some acid reflux ( dry cough).  She has been trying eating smaller portions, earing earlier in the evening, avoiding spicy foods.   May 27,2015: Kristin Mack is doing well. No CP or dyspnea.  Stressed out over family issues. Her sons were living with her for several months. She has gone up on her Effexor.  She has had some left thigh problems.   Dec. 1, 2015:  Kristin Mack is seen today for follow up of her CHF. She has done well.  She has not been swimming as much recently - has an ulcer on the bottom of her left great  toe.   Oct 30, 2014:  Kristin Ina, PhD is a 77 y.o. female who presents for  Follow up of her CHF. Is getting over a cold. Has gained some weight.  Is back exercising.  Does water exercises.  Likes to go line dancing .   No CP , breathing is ok.  Has lots of stress with her 24 yo son.   Nov. 8,  2016:  Doing well from a cardiac standpoint.  Lots of issues with GERD. Lots of stress ,    No Cp or dyspnea.   November 22, 2015:  Doing well.  Has gained 13 lbs since her last OV in Nov.  She is very frustrated with her 70 year old son who still lives in the house with her.   Breathing is good.   Swim 3 times  a week.    Dec. 6, 2017  Kristin Mack is seen today for a preoperative evaluation. She needs to have a right shoulder surgery and right carpal tunnel surgery.  She is doing well from a cardiac standpoint.   Has brief Chest pain / left shoulder pain .    Pains last for 2-3 minutes.   Not related to exercise - may occur after swimming.    Sept, 4, 2018:  Kristin Mack is seen today for follow up of her CAD Breathing is good .  Has been eating a bit of  extra salt.   Going to the dentist  Myoview study in Dec. 2017 shows a fixed apical defect thought to be breast attenuation.  Has not had any cp. Did not have the shoulder surgery   Nov 08, 2017: Today for follow-up of her coronary artery disease.  She is had some systolic congestive heart failure in the past but is overall doing great currently. No CP or dyspnea Has an ulcer on her left great toe.  Started as a callous which she clipped off with sissors .   Got infected.  She met up with an old boyfriend , walked quite a bit at the [place]  No CP   Is not avoiding salt as much as she should .    Wt = 230 today     Past Medical History:  Diagnosis Date  . Carpal tunnel syndrome    Left Hand  . Chest pain   . CHF (congestive heart failure) (HCC)    Dilated cardiomyopathy. Her original ejection fraction was 10-20%. Her  ejection fraction has improved to 57% by 2009  . Coronary artery disease   . Depression   . Dyslipidemia   . Foot drop, left   . GERD (gastroesophageal reflux disease)   . Headache(784.0)   . Hemorrhoids   . History of anxiety   . Hypertension   . Lower back pain   . Myocardial infarction ([place])   . Obesity   . Osteoarthritis    Severe with collapse of the left hip    Past Surgical History:  Procedure Laterality Date  . BREAST BIOPSY Right    x2  . CARDIAC CATHETERIZATION     Ejection Fraction was around 45-50%  . HYSTEROSCOPY  09/03/2011   Procedure: HYSTEROSCOPY;  Surgeon: Logan Bores, MD;  Location: [facility];  Service: Gynecology;  Laterality: N/A;  With Resection of polyp with truclear system  . REPLACEMENT TOTAL HIP W/  RESURFACING IMPLANTS  2002   left  . TUBAL LIGATION       Current Outpatient Medications  Medication Sig Dispense Refill  . aspirin 81 MG tablet Take 81 mg by mouth daily.     Marland Kitchen atorvastatin (LIPITOR) 40 MG tablet Take 40 mg by mouth daily.  1  . calcium carbonate (OS-CAL - DOSED IN MG OF ELEMENTAL CALCIUM) 1250 (500 Ca) MG tablet Take 1 tablet by mouth daily with breakfast.    . carvedilol (COREG) 12.5 MG tablet TAKE 1 TABLET BY MOUTH TWICE DAILY WITH A MEAL 180 tablet 2  . clobetasol cream (TEMOVATE) 4.69 % Apply 1 application topically 2 (two) times daily.     . furosemide (LASIX) 40 MG tablet Take 1 tablet (40 mg total) by mouth daily. 30 tablet 6  . latanoprost (XALATAN) 0.005 % ophthalmic solution Place 1 drop into both eyes at bedtime.  0  . lisinopril (PRINIVIL,ZESTRIL) 10 MG tablet take 1 tablet by mouth once daily 30 tablet 6  . LORazepam (ATIVAN) 0.5 MG tablet Take 0.5 mg by mouth daily as needed for anxiety.     . Multiple Vitamin (MULTI-VITAMIN PO) Take 1 tablet by mouth daily.     . naproxen (NAPROSYN) 500 MG tablet Take 250 mg by mouth daily as needed for mild pain.     . nitroGLYCERIN (NITROSTAT) 0.4 MG SL tablet PLACE 1 TABLET UNDER  THE TONGUE IF NEEDED EVERY 5 IF NEEDED FOR CHEST PAIN 25 tablet 0  . Omega-3 Fatty Acids (FISH OIL) 1200 MG CAPS  Take 1 capsule by mouth daily.    . pantoprazole (PROTONIX) 40 MG tablet Take 40 mg by mouth 2 (two) times daily.     Marland Kitchen PRESCRIPTION MEDICATION Place 1 mL vaginally as directed. inser 1 ml vaginally 2-3 times per week as directed  Estradiol Micronized  0.02% cream (is compounded for patient)    . Venlafaxine HCl (EFFEXOR PO) Take 225 mg by mouth daily.    . Vitamin D, Ergocalciferol, (DRISDOL) 50000 UNITS CAPS Take 50,000 Units by mouth every 30 (thirty) days.      No current facility-administered medications for this visit.     Allergies:   Nickel    Social History:  The patient  reports that she has never smoked. She has never used smokeless tobacco. She reports that she does not drink alcohol or use drugs.   Family History:  The patient's family history includes Cancer in her sister; Stroke in her maternal grandfather and mother.    ROS:   Noted in current history, otherwise review of systems is negative.   Physical Exam: Blood pressure 102/62, pulse 72, height 5' 6.6" (1.692 m), weight 230 lb (104.3 kg), SpO2 96 %.  GEN:  Well nourished, well developed in no acute distress HEENT: Normal NECK: No JVD; No carotid bruits LYMPHATICS: No lymphadenopathy CARDIAC: RRR , no murmurs, rubs, gallops RESPIRATORY:  Clear to auscultation without rales, wheezing or rhonchi  ABDOMEN: Soft, non-tender, non-distended MUSCULOSKELETAL:  Trace edema  SKIN: Warm and dry NEUROLOGIC:  Alert and oriented x 3   EKG:       Recent Labs: 02/23/2017: ALT 14; BUN 19; Creatinine, Ser 0.87; Potassium 4.6; Sodium 142    Lipid Panel    Component Value Date/Time   CHOL 157 02/23/2017 0848   TRIG 95 02/23/2017 0848   HDL 56 02/23/2017 0848   CHOLHDL 2.8 02/23/2017 0848   CHOLHDL 2.7 05/27/2016 1044   VLDL 23 05/27/2016 1044   LDLCALC 82 02/23/2017 0848      Wt Readings from Last 3  Encounters:  11/08/17 230 lb (104.3 kg)  02/23/17 235 lb 6.4 oz (106.8 kg)  06/27/16 233 lb (105.7 kg)      Other studies Reviewed: Additional studies/ records that were reviewed today include: . Review of the above records demonstrates:    ASSESSMENT AND PLAN:  1. Chronic systolic CHF-   No symptoms .  Ejection fraction is now normal.  2. CAD :   No angina    3. Hyperlipidemia -    check labs today.  3. Left hip replacement  4.  Nonhealing toe ulcer: She has a nonhealing ulcer on the bottom of her right great toe.  This started as a callus that she clipped off with some clippers.  It got infected and has never healed.  Avoid that she might have peripheral arterial disease as an explanation of why this area did not heal.  Will get screening for PVD .    Current medicines are reviewed at length with the patient today.  The patient does not have concerns regarding medicines.  The following changes have been made:  no change  Labs/ tests ordered today include:  No orders of the defined types were placed in this encounter.   Disposition:   FU with me in 6 months      Mertie Moores, MD  11/08/2017 3:45 PM    [facility] [place], [place], [place]  [address] Phone: [phone]; Fax: [phone]

## 2017-11-08 NOTE — Patient Instructions (Signed)
Medication Instructions:  Your physician recommends that you continue on your current medications as directed. Please refer to the Current Medication list given to you today.   Labwork: TODAY - cholesterol, liver panel, basic metabolic panel   Testing/Procedures: Your physician has requested that you have a upper extremity arterial duplex. This test is an ultrasound of the arteries in the legs or arms. It looks at arterial blood flow in the legs and arms. Allow one hour for Lower and Upper Arterial scans. There are no restrictions or special instructions   Follow-Up: Your physician wants you to follow-up in: 6 months with Dr. Acie Fredrickson. You will receive a reminder letter in the mail two months in advance. If you don't receive a letter, please call our office to schedule the follow-up appointment.   If you need a refill on your cardiac medications before your next appointment, please call your pharmacy.   Thank you for choosing [facility]! Christen Bame, RN [phone]

## 2017-11-09 LAB — HEPATIC FUNCTION PANEL
ALT: 19 IU/L (ref 0–32)
AST: 23 IU/L (ref 0–40)
Albumin: 4.4 g/dL (ref 3.5–4.8)
Alkaline Phosphatase: 94 IU/L (ref 39–117)
Bilirubin Total: 0.4 mg/dL (ref 0.0–1.2)
Bilirubin, Direct: 0.12 mg/dL (ref 0.00–0.40)
Total Protein: 6.9 g/dL (ref 6.0–8.5)

## 2017-11-09 LAB — LIPID PANEL
Chol/HDL Ratio: 2.4 ratio (ref 0.0–4.4)
Cholesterol, Total: 154 mg/dL (ref 100–199)
HDL: 64 mg/dL (ref >39)
LDL Calculated: 61 mg/dL (ref 0–99)
Triglycerides: 145 mg/dL (ref 0–149)
VLDL Cholesterol Cal: 29 mg/dL (ref 5–40)

## 2017-11-09 LAB — BASIC METABOLIC PANEL
BUN/Creatinine Ratio: 24 (ref 12–28)
BUN: 20 mg/dL (ref 8–27)
CO2: 25 mmol/L (ref 20–29)
Calcium: 9.6 mg/dL (ref 8.7–10.3)
Chloride: 101 mmol/L (ref 96–106)
Creatinine, Ser: 0.82 mg/dL (ref 0.57–1.00)
GFR calc Af Amer: 82 mL/min/{1.73_m2} (ref >59)
GFR calc non Af Amer: 71 mL/min/{1.73_m2} (ref >59)
Glucose: 87 mg/dL (ref 65–99)
Potassium: 4.8 mmol/L (ref 3.5–5.2)
Sodium: 142 mmol/L (ref 134–144)

## 2017-11-13 ENCOUNTER — Other Ambulatory Visit: Payer: Self-pay | Admitting: Cardiovascular Disease

## 2017-11-17 ENCOUNTER — Encounter (HOSPITAL_BASED_OUTPATIENT_CLINIC_OR_DEPARTMENT_OTHER): Payer: Medicare Other | Attending: Physician Assistant

## 2017-11-17 DIAGNOSIS — L97522 Non-pressure chronic ulcer of other part of left foot with fat layer exposed: Secondary | ICD-10-CM | POA: Diagnosis not present

## 2017-11-17 DIAGNOSIS — M21372 Foot drop, left foot: Secondary | ICD-10-CM | POA: Insufficient documentation

## 2017-11-17 DIAGNOSIS — I252 Old myocardial infarction: Secondary | ICD-10-CM | POA: Insufficient documentation

## 2017-11-17 DIAGNOSIS — I509 Heart failure, unspecified: Secondary | ICD-10-CM | POA: Insufficient documentation

## 2017-11-17 DIAGNOSIS — G629 Polyneuropathy, unspecified: Secondary | ICD-10-CM | POA: Insufficient documentation

## 2017-11-17 DIAGNOSIS — I110 Hypertensive heart disease with heart failure: Secondary | ICD-10-CM | POA: Diagnosis not present

## 2017-11-19 ENCOUNTER — Encounter (HOSPITAL_BASED_OUTPATIENT_CLINIC_OR_DEPARTMENT_OTHER): Payer: Medicare Other

## 2017-11-19 ENCOUNTER — Telehealth: Payer: Self-pay | Admitting: Cardiovascular Disease

## 2017-11-19 ENCOUNTER — Other Ambulatory Visit: Payer: Self-pay | Admitting: Cardiovascular Disease

## 2017-11-19 DIAGNOSIS — L97521 Non-pressure chronic ulcer of other part of left foot limited to breakdown of skin: Secondary | ICD-10-CM

## 2017-11-23 ENCOUNTER — Ambulatory Visit (HOSPITAL_COMMUNITY)
Admission: RE | Admit: 2017-11-23 | Discharge: 2017-11-23 | Disposition: A | Discharge status: Home / Self Care | Payer: Medicare Other | Source: Ambulatory Visit | Attending: Cardiovascular Disease | Admitting: Cardiovascular Disease

## 2017-11-23 ENCOUNTER — Other Ambulatory Visit: Payer: Self-pay | Admitting: Cardiovascular Disease

## 2017-11-23 DIAGNOSIS — L97521 Non-pressure chronic ulcer of other part of left foot limited to breakdown of skin: Secondary | ICD-10-CM | POA: Diagnosis not present

## 2017-11-23 DIAGNOSIS — I2510 Atherosclerotic heart disease of native coronary artery without angina pectoris: Secondary | ICD-10-CM

## 2017-11-24 ENCOUNTER — Encounter (HOSPITAL_BASED_OUTPATIENT_CLINIC_OR_DEPARTMENT_OTHER): Payer: Medicare Other | Attending: Physician Assistant

## 2017-11-24 DIAGNOSIS — I110 Hypertensive heart disease with heart failure: Secondary | ICD-10-CM | POA: Insufficient documentation

## 2017-11-24 DIAGNOSIS — E1140 Type 2 diabetes mellitus with diabetic neuropathy, unspecified: Secondary | ICD-10-CM | POA: Insufficient documentation

## 2017-11-24 DIAGNOSIS — I509 Heart failure, unspecified: Secondary | ICD-10-CM | POA: Insufficient documentation

## 2017-11-24 DIAGNOSIS — I252 Old myocardial infarction: Secondary | ICD-10-CM | POA: Insufficient documentation

## 2017-11-24 DIAGNOSIS — M21372 Foot drop, left foot: Secondary | ICD-10-CM | POA: Insufficient documentation

## 2017-11-24 DIAGNOSIS — L97522 Non-pressure chronic ulcer of other part of left foot with fat layer exposed: Secondary | ICD-10-CM | POA: Insufficient documentation

## 2017-11-25 DIAGNOSIS — M21372 Foot drop, left foot: Secondary | ICD-10-CM | POA: Diagnosis not present

## 2017-11-25 DIAGNOSIS — I110 Hypertensive heart disease with heart failure: Secondary | ICD-10-CM | POA: Diagnosis not present

## 2017-11-25 DIAGNOSIS — I252 Old myocardial infarction: Secondary | ICD-10-CM | POA: Diagnosis not present

## 2017-11-25 DIAGNOSIS — L97522 Non-pressure chronic ulcer of other part of left foot with fat layer exposed: Secondary | ICD-10-CM | POA: Diagnosis not present

## 2017-11-25 DIAGNOSIS — E1140 Type 2 diabetes mellitus with diabetic neuropathy, unspecified: Secondary | ICD-10-CM | POA: Diagnosis not present

## 2017-11-25 DIAGNOSIS — I509 Heart failure, unspecified: Secondary | ICD-10-CM | POA: Diagnosis not present

## 2017-12-01 ENCOUNTER — Other Ambulatory Visit: Payer: Self-pay | Admitting: Cardiovascular Disease

## 2017-12-01 DIAGNOSIS — L97522 Non-pressure chronic ulcer of other part of left foot with fat layer exposed: Secondary | ICD-10-CM | POA: Diagnosis not present

## 2017-12-01 NOTE — Telephone Encounter (Signed)
Spoke with pharmacist at [facility] and gave verbal order for patients furosemide as they state that they must have had a glitch in their system.

## 2017-12-01 NOTE — Telephone Encounter (Signed)
Outpatient Medication Detail    Disp Refills Start End   furosemide (LASIX) 40 MG tablet 90 tablet 3 11/23/2017    Sig: TAKE 1 TABLET BY MOUTH ONCE DAILY   Sent to pharmacy as: furosemide (LASIX) 40 MG tablet   Notes to Pharmacy: **Patient requests 90 days supply**   E-Prescribing Status: Receipt confirmed by pharmacy (11/23/2017 3:16 PM EDT)   Pharmacy   [id number] - [place], [address] AT [address]

## 2017-12-08 ENCOUNTER — Other Ambulatory Visit (HOSPITAL_COMMUNITY): Payer: Self-pay | Admitting: *Deleted

## 2017-12-08 ENCOUNTER — Ambulatory Visit (HOSPITAL_COMMUNITY)
Admission: RE | Admit: 2017-12-08 | Discharge: 2017-12-08 | Disposition: A | Discharge status: Home / Self Care | Payer: Medicare Other | Source: Ambulatory Visit | Attending: Physician Assistant | Admitting: Physician Assistant

## 2017-12-08 DIAGNOSIS — M86172 Other acute osteomyelitis, left ankle and foot: Secondary | ICD-10-CM

## 2017-12-08 DIAGNOSIS — M21372 Foot drop, left foot: Secondary | ICD-10-CM | POA: Insufficient documentation

## 2017-12-08 DIAGNOSIS — L97522 Non-pressure chronic ulcer of other part of left foot with fat layer exposed: Secondary | ICD-10-CM | POA: Insufficient documentation

## 2017-12-15 DIAGNOSIS — L97522 Non-pressure chronic ulcer of other part of left foot with fat layer exposed: Secondary | ICD-10-CM | POA: Diagnosis not present

## 2017-12-16 ENCOUNTER — Other Ambulatory Visit: Payer: Self-pay | Admitting: Obstetrics and Gynecology

## 2017-12-16 DIAGNOSIS — Z1231 Encounter for screening mammogram for malignant neoplasm of breast: Secondary | ICD-10-CM

## 2017-12-21 ENCOUNTER — Ambulatory Visit: Payer: Medicare Other | Admitting: Podiatry

## 2017-12-21 DIAGNOSIS — B351 Tinea unguium: Secondary | ICD-10-CM | POA: Diagnosis not present

## 2017-12-21 DIAGNOSIS — M79676 Pain in unspecified toe(s): Secondary | ICD-10-CM | POA: Diagnosis not present

## 2017-12-21 DIAGNOSIS — L89892 Pressure ulcer of other site, stage 2: Secondary | ICD-10-CM | POA: Diagnosis not present

## 2017-12-21 NOTE — Progress Notes (Signed)
She presents today for follow-up painful elongated toenails and superficial ulceration hallux left.  History of dropfoot left.  Objective: Vital signs are stable alert and oriented x3.  Pulses are palpable.  Neurologic sensorium is intact.  Degenerative flexors are intact.  Muscle strength normal symmetrical.  Orthopedic evaluation demonstrates all joints distal to ankle full range of motion without crepitation.  Mallet toe deformity hallux left resulting in skin breakdown and chronic ulceration.  No erythema edema cellulitis drainage or odor toenails are thick yellow dystrophic onychomycotic.  Assessment: Chronic ulceration hallux left.  Pain in limb secondary onychomycosis.  Plan: Debridement of toenails 1 through 5 bilateral.  Debridement of ulceration redressed today dressed a compressive dressing continue current dressing regimen follow-up with me as needed.  May need to consider an interphalangeal joint flexor tenotomy.

## 2017-12-22 ENCOUNTER — Encounter (HOSPITAL_BASED_OUTPATIENT_CLINIC_OR_DEPARTMENT_OTHER): Payer: Medicare Other | Attending: Physician Assistant

## 2017-12-22 DIAGNOSIS — E669 Obesity, unspecified: Secondary | ICD-10-CM | POA: Diagnosis not present

## 2017-12-22 DIAGNOSIS — I110 Hypertensive heart disease with heart failure: Secondary | ICD-10-CM | POA: Insufficient documentation

## 2017-12-22 DIAGNOSIS — Z6837 Body mass index (BMI) 37.0-37.9, adult: Secondary | ICD-10-CM | POA: Insufficient documentation

## 2017-12-22 DIAGNOSIS — I509 Heart failure, unspecified: Secondary | ICD-10-CM | POA: Insufficient documentation

## 2017-12-22 DIAGNOSIS — I252 Old myocardial infarction: Secondary | ICD-10-CM | POA: Diagnosis not present

## 2017-12-22 DIAGNOSIS — I2510 Atherosclerotic heart disease of native coronary artery without angina pectoris: Secondary | ICD-10-CM | POA: Insufficient documentation

## 2017-12-22 DIAGNOSIS — Z96642 Presence of left artificial hip joint: Secondary | ICD-10-CM | POA: Diagnosis not present

## 2017-12-22 DIAGNOSIS — L97522 Non-pressure chronic ulcer of other part of left foot with fat layer exposed: Secondary | ICD-10-CM | POA: Insufficient documentation

## 2017-12-29 DIAGNOSIS — L97522 Non-pressure chronic ulcer of other part of left foot with fat layer exposed: Secondary | ICD-10-CM | POA: Diagnosis not present

## 2018-01-05 DIAGNOSIS — L97522 Non-pressure chronic ulcer of other part of left foot with fat layer exposed: Secondary | ICD-10-CM | POA: Diagnosis not present

## 2018-01-14 ENCOUNTER — Other Ambulatory Visit: Payer: Self-pay | Admitting: Cardiovascular Disease

## 2018-01-18 ENCOUNTER — Ambulatory Visit
Admission: RE | Admit: 2018-01-18 | Discharge: 2018-01-18 | Disposition: A | Discharge status: Home / Self Care | Payer: Medicare Other | Source: Ambulatory Visit | Attending: Obstetrics and Gynecology | Admitting: Obstetrics and Gynecology

## 2018-01-18 DIAGNOSIS — Z1231 Encounter for screening mammogram for malignant neoplasm of breast: Secondary | ICD-10-CM

## 2018-01-19 ENCOUNTER — Other Ambulatory Visit: Payer: Self-pay | Admitting: Cardiovascular Disease

## 2018-01-19 DIAGNOSIS — L97522 Non-pressure chronic ulcer of other part of left foot with fat layer exposed: Secondary | ICD-10-CM | POA: Diagnosis not present

## 2018-01-19 MED ORDER — LISINOPRIL 10 MG PO TABS: 10.0000 mg | ORAL_TABLET | Freq: Every day | ORAL | 3 refills | Status: DC

## 2018-01-19 NOTE — Telephone Encounter (Signed)
Pt's medication was sent to pt's pharmacy as requested. Confirmation received.  °

## 2018-01-26 ENCOUNTER — Encounter (HOSPITAL_BASED_OUTPATIENT_CLINIC_OR_DEPARTMENT_OTHER): Payer: Medicare Other | Attending: Physician Assistant

## 2018-02-23 ENCOUNTER — Telehealth: Payer: Self-pay | Admitting: Podiatry

## 2018-02-23 NOTE — Telephone Encounter (Signed)
I would like to have a copy of the biopsy that one of the doctors there had done on one of my toenails on my left foot. This was done about 4-5 years ago. I will be there tomorrow morning for an appointment with Dr. Milinda Pointer and I can just pick it up then. If not, you could just mail it to me. My phone number is [phone]. Thank you.

## 2018-02-24 ENCOUNTER — Encounter: Payer: Self-pay | Admitting: Podiatry

## 2018-02-24 ENCOUNTER — Ambulatory Visit: Payer: Medicare Other | Admitting: Podiatry

## 2018-02-24 DIAGNOSIS — B351 Tinea unguium: Secondary | ICD-10-CM

## 2018-02-24 DIAGNOSIS — L89892 Pressure ulcer of other site, stage 2: Secondary | ICD-10-CM | POA: Diagnosis not present

## 2018-02-24 DIAGNOSIS — M79676 Pain in unspecified toe(s): Secondary | ICD-10-CM | POA: Diagnosis not present

## 2018-02-26 NOTE — Progress Notes (Signed)
She presents today chief complaint of painful elongated toenails.  Pressure lesion appears to be healing hallux left.  Objective: Vital signs are stable she is alert and oriented x3.  Toenails are long thick yellow dystrophic-like mycotic painful palpation as well as debridement.  Hallux left demonstrates mild reactive hyper keratoma distal medial aspect of the hammertoe deformity.  No open lesions or wounds.  Assessment: Pain in limb secondary to onychomycosis.  Slowly healing ulceration distal medial aspect of the hallux left.  Debrided reactive hyperkeratotic lesion.  Plan: Debrided reactive hyperkeratotic lesion.  Debrided toenails 1 through 5 bilateral

## 2018-04-04 ENCOUNTER — Other Ambulatory Visit: Payer: Self-pay | Admitting: Cardiovascular Disease

## 2018-04-12 ENCOUNTER — Encounter: Payer: Self-pay | Admitting: Cardiovascular Disease

## 2018-04-28 ENCOUNTER — Encounter: Payer: Self-pay | Admitting: Podiatry

## 2018-04-28 ENCOUNTER — Ambulatory Visit: Payer: Medicare Other | Admitting: Podiatry

## 2018-04-28 ENCOUNTER — Other Ambulatory Visit: Payer: Self-pay | Admitting: Cardiovascular Disease

## 2018-04-28 DIAGNOSIS — L89892 Pressure ulcer of other site, stage 2: Secondary | ICD-10-CM | POA: Diagnosis not present

## 2018-04-28 DIAGNOSIS — B351 Tinea unguium: Secondary | ICD-10-CM

## 2018-04-28 DIAGNOSIS — M79676 Pain in unspecified toe(s): Secondary | ICD-10-CM

## 2018-04-28 NOTE — Progress Notes (Signed)
She presents today for follow-up of her ulceration and painful elongated toenails.  She states the ulceration seems to be doing better.  Objective: Toenails are long thick yellow dystrophic-like mycotic ulceration to the hallux left is completely healed at this point.  There is no skin breakdown no open lesions no signs of infection.  Assessment: Pain in limb secondary onychomycosis well-healed ulceration hallux left.  Plan: Continue current therapies with a hallux left and debrided toenails 1 through 5 bilateral.

## 2018-05-03 ENCOUNTER — Ambulatory Visit: Payer: Medicare Other | Admitting: Cardiovascular Disease

## 2018-05-03 ENCOUNTER — Encounter: Payer: Self-pay | Admitting: Cardiovascular Disease

## 2018-05-03 VITALS — BP 110/60 | HR 73 | Ht 66.6 in | Wt 228.0 lb

## 2018-05-03 DIAGNOSIS — E782 Mixed hyperlipidemia: Secondary | ICD-10-CM

## 2018-05-03 DIAGNOSIS — I5022 Chronic systolic (congestive) heart failure: Secondary | ICD-10-CM

## 2018-05-03 DIAGNOSIS — I2510 Atherosclerotic heart disease of native coronary artery without angina pectoris: Secondary | ICD-10-CM

## 2018-05-03 LAB — BASIC METABOLIC PANEL
BUN/Creatinine Ratio: 18 (ref 12–28)
BUN: 16 mg/dL (ref 8–27)
CO2: 24 mmol/L (ref 20–29)
Calcium: 9.5 mg/dL (ref 8.7–10.3)
Chloride: 102 mmol/L (ref 96–106)
Creatinine, Ser: 0.9 mg/dL (ref 0.57–1.00)
GFR calc Af Amer: 72 mL/min/{1.73_m2} (ref >59)
GFR calc non Af Amer: 63 mL/min/{1.73_m2} (ref >59)
Glucose: 95 mg/dL (ref 65–99)
Potassium: 5 mmol/L (ref 3.5–5.2)
Sodium: 140 mmol/L (ref 134–144)

## 2018-05-03 LAB — LIPID PANEL
Chol/HDL Ratio: 2.7 ratio (ref 0.0–4.4)
Cholesterol, Total: 170 mg/dL (ref 100–199)
HDL: 62 mg/dL (ref >39)
LDL Calculated: 89 mg/dL (ref 0–99)
Triglycerides: 97 mg/dL (ref 0–149)
VLDL Cholesterol Cal: 19 mg/dL (ref 5–40)

## 2018-05-03 LAB — HEPATIC FUNCTION PANEL
ALT: 18 IU/L (ref 0–32)
AST: 22 IU/L (ref 0–40)
Albumin: 4.3 g/dL (ref 3.5–4.8)
Alkaline Phosphatase: 101 IU/L (ref 39–117)
Bilirubin Total: 0.7 mg/dL (ref 0.0–1.2)
Bilirubin, Direct: 0.17 mg/dL (ref 0.00–0.40)
Total Protein: 6.6 g/dL (ref 6.0–8.5)

## 2018-05-03 NOTE — Progress Notes (Signed)
Cardiology Office Note   Date:  05/03/2018   ID:  [name], DOB 08/18/1940, MRN [id number]  PCP:  Kristin Battles, MD  Cardiologist:   Kristin Moores, MD   Chief Complaint  Patient presents with  . Congestive Heart Failure   1. CHF- LV EF has improved dramatically 2. Hyperlipidemia 3. Left hip replacement   77 year old female with history of congestive heart failure. Her original ejection fraction was 10-20%. Her ejection fraction has dramatically improved and is now 57% ( Echo 2009). She had an echocardiogram in February 2011 that revealed normal left ventricle systolic function.  She's had 2 heart catheterizations. Her last cardiac cath was in 2006 which revealed moderate irregularities.  She's not having any episodes of chest pain or shortness breath. She works out a regular basis at least 2-3 times a week.  She has gained 20 lbs over the past several months. She has a foot ulcer in her left foot and has not been in the pool.  She's not had any angina-like chest pain. She did have some stress-related chest discomfort around Christmastime but that has since resolved.  October 18, 2012:  Kristin Mack has done well for the past 6 months. She has not had any dyspnea. She has had lots of left hip pain and has not been able to exercise as much. She is swimming 4 times a week.  Nov. 24, 2014:  Kristin Mack is doing well. She has been having some acid reflux ( dry cough).  She has been trying eating smaller portions, earing earlier in the evening, avoiding spicy foods.   May 27,2015: Kristin Mack is doing well. No CP or dyspnea.  Stressed out over family issues. Her sons were living with her for several months. She has gone up on her Effexor.  She has had some left thigh problems.   Dec. 1, 2015:  Kristin Mack is seen today for follow up of her CHF. She has done well.  She has not been swimming as much recently - has an ulcer on the bottom of her left great  toe.   Oct 30, 2014:  [name] is a 78 y.o. female who presents for  Follow up of her CHF. Is getting over a cold. Has gained some weight.  Is back exercising.  Does water exercises.  Likes to go line dancing .   No CP , breathing is ok.  Has lots of stress with her 88 yo son.   Nov. 8,  2016:  Doing well from a cardiac standpoint.  Lots of issues with GERD. Lots of stress ,    No Cp or dyspnea.   November 22, 2015:  Doing well.  Has gained 13 lbs since her last OV in Nov.  She is very frustrated with her 75 year old son who still lives in the house with her.   Breathing is good.   Swim 3 times  a week.    Dec. 6, 2017  Kristin Mack is seen today for a preoperative evaluation. She needs to have a right shoulder surgery and right carpal tunnel surgery.  She is doing well from a cardiac standpoint.   Has brief Chest pain / left shoulder pain .    Pains last for 2-3 minutes.   Not related to exercise - may occur after swimming.    Sept, 4, 2018:  Kristin Mack is seen today for follow up of her CAD Breathing is good .  Has been eating a bit of  extra salt.   Going to the dentist  Myoview study in Dec. 2017 shows a fixed apical defect thought to be breast attenuation.  Has not had any cp. Did not have the shoulder surgery   Nov 08, 2017: Today for follow-up of her coronary artery disease.  She is had some systolic congestive heart failure in the past but is overall doing great currently. No CP or dyspnea Has an ulcer on her left great toe.  Started as a callous which she clipped off with sissors .   Got infected.  She met up with an old boyfriend , walked quite a bit at the [employer]  No CP   Is not avoiding salt as much as she should .    Wt = 230 today   May 03, 2018: Kristin Mack seen today for follow-up visit. Weight today is 228 pounds. Has not been exercising in the pool  ( due to toe ulcer )  Its now healed.  Breathing is ok She was on the incorrect dose of  Lisinopril for several months Now is on the right dose.  Has some atypical CP with arm movement -  No angina    Past Medical History:  Diagnosis Date  . Carpal tunnel syndrome    Left Hand  . Chest pain   . CHF (congestive heart failure) (HCC)    Dilated cardiomyopathy. Her original ejection fraction was 10-20%. Her ejection fraction has improved to 57% by 2009  . Coronary artery disease   . Depression   . Dyslipidemia   . Foot drop, left   . GERD (gastroesophageal reflux disease)   . Headache(784.0)   . Hemorrhoids   . History of anxiety   . Hypertension   . Lower back pain   . Myocardial infarction ([place])   . Obesity   . Osteoarthritis    Severe with collapse of the left hip    Past Surgical History:  Procedure Laterality Date  . BREAST BIOPSY Right    x2  . CARDIAC CATHETERIZATION     Ejection Fraction was around 45-50%  . HYSTEROSCOPY  09/03/2011   Procedure: HYSTEROSCOPY;  Surgeon: Logan Bores, MD;  Location: [facility];  Service: Gynecology;  Laterality: N/A;  With Resection of polyp with truclear system  . REPLACEMENT TOTAL HIP W/  RESURFACING IMPLANTS  2002   left  . TUBAL LIGATION       Current Outpatient Medications  Medication Sig Dispense Refill  . aspirin 81 MG tablet Take 81 mg by mouth daily.     Marland Kitchen atorvastatin (LIPITOR) 40 MG tablet Take 40 mg by mouth daily.  1  . calcium carbonate (OS-CAL - DOSED IN MG OF ELEMENTAL CALCIUM) 1250 (500 Ca) MG tablet Take 1 tablet by mouth daily with breakfast.    . calcium citrate-vitamin D (CITRACAL+D) 315-200 MG-UNIT tablet Take by mouth.    . carvedilol (COREG) 12.5 MG tablet TAKE 1 TABLET BY MOUTH TWICE DAILY WITH A MEAL 180 tablet 2  . clobetasol cream (TEMOVATE) 3.54 % Apply 1 application topically 2 (two) times daily.     . furosemide (LASIX) 40 MG tablet TAKE 1 TABLET BY MOUTH ONCE DAILY 90 tablet 3  . latanoprost (XALATAN) 0.005 % ophthalmic solution Place 1 drop into both eyes at bedtime.  0  .  lisinopril (PRINIVIL,ZESTRIL) 10 MG tablet Take 1 tablet (10 mg total) by mouth daily. 90 tablet 3  . LORazepam (ATIVAN) 0.5 MG tablet Take 0.5 mg by mouth  daily as needed for anxiety.     . Multiple Vitamin (MULTI-VITAMIN PO) Take 1 tablet by mouth daily.     . naproxen (NAPROSYN) 500 MG tablet Take 250 mg by mouth daily as needed for mild pain.     . nitroGLYCERIN (NITROSTAT) 0.4 MG SL tablet PLACE 1 TABLET UNDER THE TONGUE IF NEEDED EVERY 5 IF NEEDED FOR CHEST PAIN 25 tablet 4  . pantoprazole (PROTONIX) 40 MG tablet Take 40 mg by mouth 2 (two) times daily.     Marland Kitchen PRESCRIPTION MEDICATION Place 1 mL vaginally as directed. inser 1 ml vaginally 2-3 times per week as directed  Estradiol Micronized  0.02% cream (is compounded for patient)    . Venlafaxine HCl (EFFEXOR PO) Take 225 mg by mouth daily.    . Vitamin D, Ergocalciferol, (DRISDOL) 50000 UNITS CAPS Take 50,000 Units by mouth every 30 (thirty) days.      No current facility-administered medications for this visit.     Allergies:   Nickel    Social History:  The patient  reports that she has never smoked. She has never used smokeless tobacco. She reports that she does not drink alcohol or use drugs.   Family History:  The patient's family history includes Cancer in her sister; Stroke in her maternal grandfather and mother.    ROS:   Noted in current history, otherwise review of systems is negative.   Physical Exam: Blood pressure 110/60, pulse 73, height 5' 6.6" (1.692 m), weight 228 lb (103.4 kg), SpO2 96 %.  GEN:  Well nourished, well developed in no acute distress HEENT: Normal NECK: No JVD; No carotid bruits LYMPHATICS: No lymphadenopathy CARDIAC: RRR  RESPIRATORY:  Clear to auscultation without rales, wheezing or rhonchi  ABDOMEN: Soft, non-tender, non-distended MUSCULOSKELETAL:  No edema; No deformity  SKIN: Warm and dry NEUROLOGIC:  Alert and oriented x 3   EKG:    May 03, 2018: Normal sinus rhythm at 73.   Nonspecific ST abnormality's.   Recent Labs: 11/08/2017: ALT 19; BUN 20; Creatinine, Ser 0.82; Potassium 4.8; Sodium 142    Lipid Panel    Component Value Date/Time   CHOL 154 11/08/2017 1626   TRIG 145 11/08/2017 1626   HDL 64 11/08/2017 1626   CHOLHDL 2.4 11/08/2017 1626   CHOLHDL 2.7 05/27/2016 1044   VLDL 23 05/27/2016 1044   LDLCALC 61 11/08/2017 1626      Wt Readings from Last 3 Encounters:  05/03/18 228 lb (103.4 kg)  11/08/17 230 lb (104.3 kg)  02/23/17 235 lb 6.4 oz (106.8 kg)      Other studies Reviewed: Additional studies/ records that were reviewed today include: . Review of the above records demonstrates:    ASSESSMENT AND PLAN:  1. Chronic systolic CHF-     Overall seems stable  Her LV function has improved significantly .  2. CAD :    No angina , doing wel  3. Hyperlipidemia -   Check labs today .  Advised her to work on diet, weight loss program .   3. Left hip replacement        Current medicines are reviewed at length with the patient today.  The patient does not have concerns regarding medicines.  The following changes have been made:  no change  Labs/ tests ordered today include:   Orders Placed This Encounter  Procedures  . Lipid Profile  . Basic Metabolic Panel (BMET)  . Hepatic function panel  . EKG 12-Lead  Disposition:   FU with me in 6 months      Kristin Moores, MD  05/03/2018 12:36 PM    [address], [place], [place]  [address] Phone: [phone]; Fax: [phone]

## 2018-05-03 NOTE — Patient Instructions (Signed)
Medication Instructions:  Your physician recommends that you continue on your current medications as directed. Please refer to the Current Medication list given to you today.  If you need a refill on your cardiac medications before your next appointment, please call your pharmacy.    Lab work: TODAY - cholesterol, liver panel, basic metabolic panel  If you have labs (blood work) drawn today and your tests are completely normal, you will receive your results only by: . MyChart Message (if you have MyChart) OR . A paper copy in the mail If you have any lab test that is abnormal or we need to change your treatment, we will call you to review the results.   Testing/Procedures: None Ordered   Follow-Up: At [facility], you and your health needs are our priority.  As part of our continuing mission to provide you with exceptional heart care, we have created designated Provider Care Teams.  These Care Teams include your primary Cardiologist (physician) and Advanced Practice Providers (APPs -  Physician Assistants and Nurse Practitioners) who all work together to provide you with the care you need, when you need it. You will need a follow up appointment in:  6 months.  Please call our office 2 months in advance to schedule this appointment.  You may see Philip Nahser, MD or one of the following Advanced Practice Providers on your designated Care Team: Scott Weaver, PA-C Vin Bhagat, PA-C . Janine Hammond, NP   

## 2018-07-07 ENCOUNTER — Ambulatory Visit: Payer: Medicare Other | Admitting: Podiatry

## 2018-07-07 ENCOUNTER — Encounter: Payer: Self-pay | Admitting: Podiatry

## 2018-07-07 DIAGNOSIS — M79676 Pain in unspecified toe(s): Secondary | ICD-10-CM

## 2018-07-07 DIAGNOSIS — L97521 Non-pressure chronic ulcer of other part of left foot limited to breakdown of skin: Secondary | ICD-10-CM

## 2018-07-07 DIAGNOSIS — B351 Tinea unguium: Secondary | ICD-10-CM | POA: Diagnosis not present

## 2018-07-07 NOTE — Progress Notes (Signed)
She presents today chief complaint painful elongated toenails.  Patient: Vitals are stable alert oriented x3.  Hallux is healed with this ulcer left.  Toenails are long thick yellow dystrophic with mycotic.  Assessment: Well-healing ulceration hallux left.  Pain in limb second onychomycosis.  Plan: Discussed etiology pathology and surgical therapies.  Dispensed silicone cotton for the hallux left follow-up with her in 3 months

## 2018-07-15 NOTE — Telephone Encounter (Signed)
ERROR

## 2018-09-07 ENCOUNTER — Other Ambulatory Visit: Payer: Self-pay

## 2018-09-07 MED ORDER — CARVEDILOL 12.5 MG PO TABS: ORAL_TABLET | ORAL | 2 refills | Status: DC

## 2018-09-15 ENCOUNTER — Ambulatory Visit: Payer: Medicare Other | Admitting: Podiatry

## 2018-09-24 ENCOUNTER — Other Ambulatory Visit: Payer: Self-pay | Admitting: Cardiovascular Disease

## 2018-09-28 MED ORDER — CARVEDILOL 12.5 MG PO TABS: ORAL_TABLET | ORAL | 2 refills | Status: DC

## 2018-09-28 NOTE — Addendum Note (Signed)
Addended by: Derl Barrow on: 09/28/2018 12:04 PM   Modules accepted: Orders

## 2018-09-28 NOTE — Telephone Encounter (Signed)
Pt called requesting her medication carvedilol resent to [facility]. Medication was resent to preferred pharmacy. Confirmation received.

## 2018-11-02 ENCOUNTER — Other Ambulatory Visit: Payer: Self-pay | Admitting: Cardiovascular Disease

## 2018-11-03 ENCOUNTER — Ambulatory Visit: Payer: Medicare Other | Admitting: Podiatry

## 2018-11-03 ENCOUNTER — Other Ambulatory Visit: Payer: Self-pay

## 2018-11-03 ENCOUNTER — Encounter: Payer: Self-pay | Admitting: Podiatry

## 2018-11-03 VITALS — Temp 97.3°F

## 2018-11-03 DIAGNOSIS — M79676 Pain in unspecified toe(s): Secondary | ICD-10-CM

## 2018-11-03 DIAGNOSIS — L97521 Non-pressure chronic ulcer of other part of left foot limited to breakdown of skin: Secondary | ICD-10-CM | POA: Diagnosis not present

## 2018-11-03 DIAGNOSIS — B351 Tinea unguium: Secondary | ICD-10-CM

## 2018-11-03 MED ORDER — FUROSEMIDE 40 MG PO TABS: 40.0000 mg | ORAL_TABLET | Freq: Every day | ORAL | 2 refills | Status: DC

## 2018-11-03 NOTE — Progress Notes (Signed)
She presents today chief complaint of painful toenails and calluses bilaterally.  Objective: Vital signs are stable she is alert oriented x3.  Pulses are palpable.  No open lesions or wounds are noted.  Toenails are long thick yellow dystrophic-like mycotic painful palpation as well as debridement.  Multiple areas of porokeratosis to the plantar aspect of the bilateral foot painful in nature.  Assessment: Pain in limb secondary onychomycosis and porokeratosis.  Plan: Debridement of all reactive hyperkeratotic tissue.  Debridement of toenails 1 through 5 bilateral.

## 2018-12-20 ENCOUNTER — Telehealth: Payer: Self-pay

## 2018-12-20 NOTE — Telephone Encounter (Signed)
Left message for pt to call back about switching upcoming appt to video or phone visit.

## 2018-12-26 ENCOUNTER — Ambulatory Visit: Payer: Medicare Other | Admitting: Cardiovascular Disease

## 2018-12-26 ENCOUNTER — Other Ambulatory Visit: Payer: Self-pay

## 2018-12-27 ENCOUNTER — Telehealth: Payer: Self-pay | Admitting: Nurse Practitioner

## 2018-12-27 NOTE — Telephone Encounter (Signed)
Left message on patient's son's voice mail requesting that patient call back to verify in person office visit on 7/13 or to reschedule to a virtual clinic day if she chooses to have a virtual visit

## 2018-12-28 NOTE — Telephone Encounter (Signed)
Follow up    Patient called to confirm that she will come in office.

## 2018-12-30 ENCOUNTER — Telehealth: Payer: Self-pay | Admitting: Cardiovascular Disease

## 2018-12-30 NOTE — Telephone Encounter (Signed)
New Message ° ° ° °Left message to confirm appt and answer covid questions  °

## 2019-01-02 ENCOUNTER — Ambulatory Visit (INDEPENDENT_AMBULATORY_CARE_PROVIDER_SITE_OTHER): Payer: Medicare Other | Admitting: Cardiovascular Disease

## 2019-01-02 ENCOUNTER — Encounter: Payer: Self-pay | Admitting: Cardiovascular Disease

## 2019-01-02 ENCOUNTER — Other Ambulatory Visit: Payer: Self-pay

## 2019-01-02 VITALS — BP 110/60 | HR 87 | Ht 66.0 in | Wt 227.1 lb

## 2019-01-02 DIAGNOSIS — I2510 Atherosclerotic heart disease of native coronary artery without angina pectoris: Secondary | ICD-10-CM | POA: Diagnosis not present

## 2019-01-02 DIAGNOSIS — I5042 Chronic combined systolic (congestive) and diastolic (congestive) heart failure: Secondary | ICD-10-CM

## 2019-01-02 NOTE — Progress Notes (Signed)
Cardiology Office Note   Date:  01/02/2019   ID:  Kristin Ina, Kristin Mack, DOB [birth year], MRN [id number]  PCP:  Leanna Battles, MD  Cardiologist:   Mertie Moores, MD   Chief Complaint  Patient presents with  . Congestive Heart Failure   1. CHF- LV EF has improved dramatically 2. Hyperlipidemia 3. Left hip replacement   78 year old female with history of congestive heart failure. Her original ejection fraction was 10-20%. Her ejection fraction has dramatically improved and is now 57% ( Echo 2009). She had an echocardiogram in February 2011 that revealed normal left ventricle systolic function.  She's had 2 heart catheterizations. Her last cardiac cath was in 2006 which revealed moderate irregularities.  She's not having any episodes of chest pain or shortness breath. She works out a regular basis at least 2-3 times a week.  She has gained 20 lbs over the past several months. She has a foot ulcer in her left foot and has not been in the pool.  She's not had any angina-like chest pain. She did have some stress-related chest discomfort around Christmastime but that has since resolved.  October 18, 2012:  Kristin Mack has done well for the past 6 months. She has not had any dyspnea. She has had lots of left hip pain and has not been able to exercise as much. She is swimming 4 times a week.  Nov. 24, 2014:  Kristin Mack is doing well. She has been having some acid reflux ( dry cough).  She has been trying eating smaller portions, earing earlier in the evening, avoiding spicy foods.   May 27,2015: Kristin Mack is doing well. No CP or dyspnea.  Stressed out over family issues. Her sons were living with her for several months. She has gone up on her Effexor.  She has had some left thigh problems.   Dec. 1, 2015:  Kristin Mack is seen today for follow up of her CHF. She has done well.  She has not been swimming as much recently - has an ulcer on the bottom of her left great  toe.   Oct 30, 2014:  Kristin Ina, Kristin Mack is a 78 y.o. female who presents for  Follow up of her CHF. Is getting over a cold. Has gained some weight.  Is back exercising.  Does water exercises.  Likes to go line dancing .   No CP , breathing is ok.  Has lots of stress with her 70 yo son.   Nov. 8,  2016:  Doing well from a cardiac standpoint.  Lots of issues with GERD. Lots of stress ,    No Cp or dyspnea.   November 22, 2015:  Doing well.  Has gained 13 lbs since her last OV in Nov.  She is very frustrated with her 102 year old son who still lives in the house with her.   Breathing is good.   Swim 3 times  a week.    Dec. 6, 2017  Leather is seen today for a preoperative evaluation. She needs to have a right shoulder surgery and right carpal tunnel surgery.  She is doing well from a cardiac standpoint.   Has brief Chest pain / left shoulder pain .    Pains last for 2-3 minutes.   Not related to exercise - may occur after swimming.    Sept, 4, 2018:  Kristin Mack is seen today for follow up of her CAD Breathing is good .  Has been eating a bit of  extra salt.   Going to the dentist  Myoview study in Dec. 2017 shows a fixed apical defect thought to be breast attenuation.  Has not had any cp. Did not have the shoulder surgery   Nov 08, 2017: Today for follow-up of her coronary artery disease.  She is had some systolic congestive heart failure in the past but is overall doing great currently. No CP or dyspnea Has an ulcer on her left great toe.  Started as a callous which she clipped off with sissors .   Got infected.  She met up with an old boyfriend , walked quite a bit at the [place]  No CP   Is not avoiding salt as much as she should .    Wt = 230 today   May 03, 2018: Kristin Mack seen today for follow-up visit. Weight today is 228 pounds. Has not been exercising in the pool  ( due to toe ulcer )  Its now healed.  Breathing is ok She was on the incorrect dose of  Lisinopril for several months Now is on the right dose.  Has some atypical CP with arm movement -  No angina   January 02, 2019:  Kristin Mack is doing well.   Has not been swimming Has missed the exercise,   Has been eating more take out food Admits to eating more salt than she should  Past Medical History:  Diagnosis Date  . Carpal tunnel syndrome    Left Hand  . Chest pain   . CHF (congestive heart failure) (HCC)    Dilated cardiomyopathy. Her original ejection fraction was 10-20%. Her ejection fraction has improved to 57% by 2009  . Coronary artery disease   . Depression   . Dyslipidemia   . Foot drop, left   . GERD (gastroesophageal reflux disease)   . Headache(784.0)   . Hemorrhoids   . History of anxiety   . Hypertension   . Lower back pain   . Myocardial infarction ([place])   . Obesity   . Osteoarthritis    Severe with collapse of the left hip    Past Surgical History:  Procedure Laterality Date  . BREAST BIOPSY Right    x2  . CARDIAC CATHETERIZATION     Ejection Fraction was around 45-50%  . HYSTEROSCOPY  09/03/2011   Procedure: HYSTEROSCOPY;  Surgeon: Logan Bores, MD;  Location: [facility];  Service: Gynecology;  Laterality: N/A;  With Resection of polyp with truclear system  . REPLACEMENT TOTAL HIP W/  RESURFACING IMPLANTS  2002   left  . TUBAL LIGATION       Current Outpatient Medications  Medication Sig Dispense Refill  . ACID CONTROLLER MAX ST 20 MG tablet TAKE 1 TABLET BY MOUTH BID    . aspirin 81 MG tablet Take 81 mg by mouth daily.     Marland Kitchen atorvastatin (LIPITOR) 40 MG tablet Take 40 mg by mouth daily.  1  . calcium carbonate (OS-CAL - DOSED IN MG OF ELEMENTAL CALCIUM) 1250 (500 Ca) MG tablet Take 1 tablet by mouth daily with breakfast.    . calcium citrate-vitamin D (CITRACAL+D) 315-200 MG-UNIT tablet Take by mouth.    . carvedilol (COREG) 12.5 MG tablet TAKE 1 TABLET BY MOUTH TWICE DAILY WITH A MEAL 180 tablet 2  . clobetasol cream (TEMOVATE) 7.84 %  Apply 1 application topically 2 (two) times daily.     . furosemide (LASIX) 40 MG tablet Take 1 tablet (40 mg total) by  mouth daily. 90 tablet 2  . latanoprost (XALATAN) 0.005 % ophthalmic solution Place 1 drop into both eyes at bedtime.  0  . lisinopril (ZESTRIL) 10 MG tablet TAKE 1 TABLET(10 MG) BY MOUTH DAILY 90 tablet 1  . LORazepam (ATIVAN) 0.5 MG tablet Take 0.5 mg by mouth daily as needed for anxiety.     . metoCLOPramide (REGLAN) 5 MG tablet TAKE 1 TABLET BY MOUTH AT BEDTIME FOR REFLUX    . Multiple Vitamin (MULTI-VITAMIN PO) Take 1 tablet by mouth daily.     . naproxen (NAPROSYN) 500 MG tablet Take 250 mg by mouth daily as needed for mild pain.     . nitroGLYCERIN (NITROSTAT) 0.4 MG SL tablet PLACE 1 TABLET UNDER THE TONGUE IF NEEDED EVERY 5 IF NEEDED FOR CHEST PAIN 25 tablet 4  . pantoprazole (PROTONIX) 40 MG tablet Take 40 mg by mouth 2 (two) times daily.     . PRESCRIPTION MEDICATION Place 1 mL vaginally as directed. inser 1 ml vaginally 2-3 times per week as directed  Estradiol Micronized  0.02% cream (is compounded for patient)    . Venlafaxine HCl (EFFEXOR PO) Take 225 mg by mouth daily.    . Vitamin D, Ergocalciferol, (DRISDOL) 50000 UNITS CAPS Take 50,000 Units by mouth every 30 (thirty) days.      No current facility-administered medications for this visit.     Allergies:   Nickel    Social History:  The patient  reports that she has never smoked. She has never used smokeless tobacco. She reports that she does not drink alcohol or use drugs.   Family History:  The patient's family history includes Cancer in her sister; Stroke in her maternal grandfather and mother.    ROS:   Noted in current history, otherwise review of systems is negative.   Physical Exam: Blood pressure 110/60, pulse 87, height 5' 6" (1.676 m), weight 227 lb 1.9 oz (103 kg), SpO2 95 %.  GEN:    Elderly female, moderately obese  HEENT: Normal NECK: No JVD; No carotid bruits LYMPHATICS: No  lymphadenopathy CARDIAC: RRR   RESPIRATORY:  Clear to auscultation without rales, wheezing or rhonchi  ABDOMEN: Soft, non-tender, non-distended MUSCULOSKELETAL: 1+ pitting edema in both ankles. SKIN: Warm and dry NEUROLOGIC:  Alert and oriented x 3   EKG:       Recent Labs: 05/03/2018: ALT 18; BUN 16; Creatinine, Ser 0.90; Potassium 5.0; Sodium 140    Lipid Panel    Component Value Date/Time   CHOL 170 05/03/2018 1156   TRIG 97 05/03/2018 1156   HDL 62 05/03/2018 1156   CHOLHDL 2.7 05/03/2018 1156   CHOLHDL 2.7 05/27/2016 1044   VLDL 23 05/27/2016 1044   LDLCALC 89 05/03/2018 1156      Wt Readings from Last 3 Encounters:  01/02/19 227 lb 1.9 oz (103 kg)  05/03/18 228 lb (103.4 kg)  11/08/17 230 lb (104.3 kg)      Other studies Reviewed: Additional studies/ records that were reviewed today include: . Review of the above records demonstrates:    ASSESSMENT AND PLAN:  1. Chronic systolic CHF-      .seems to be doing great.  She is not having any episodes of chest pain or shortness of breath.  She is not been swimming since the pools are all closed with COVID-19 pandemic.  I have encouraged her to look around to see if there is some of the pools that are open.  Overall think she seems   to be making good progress.  She is been eating a little bit more takeout food including some salty foods on a regular basis.  She does have some mild leg edema.  I do not think that we need a change in medications but I have advised that she avoid any eating any extra salt.  2. CAD :     She denies having any episodes of chest pain.  3. Hyperlipidemia -    lipids look okay in November, 2019.  We will plan on checking her lipids again when I see her again in 6 months.  3. Left hip replacement        Current medicines are reviewed at length with the patient today.  The patient does not have concerns regarding medicines.  The following changes have been made:  no change  Labs/ tests  ordered today include:   No orders of the defined types were placed in this encounter.   Disposition:   FU with me in 6 months      Mertie Moores, MD  01/02/2019 1:04 PM    [facility] [place], [place], [place]  [address] Phone: [phone]; Fax: [phone]

## 2019-01-02 NOTE — Patient Instructions (Signed)

## 2019-02-02 ENCOUNTER — Encounter: Payer: Self-pay | Admitting: Podiatry

## 2019-02-02 ENCOUNTER — Ambulatory Visit: Payer: Medicare Other | Admitting: Podiatry

## 2019-02-02 ENCOUNTER — Other Ambulatory Visit: Payer: Self-pay

## 2019-02-02 VITALS — Temp 97.7°F

## 2019-02-02 DIAGNOSIS — B351 Tinea unguium: Secondary | ICD-10-CM | POA: Diagnosis not present

## 2019-02-02 DIAGNOSIS — L97521 Non-pressure chronic ulcer of other part of left foot limited to breakdown of skin: Secondary | ICD-10-CM

## 2019-02-02 DIAGNOSIS — M79676 Pain in unspecified toe(s): Secondary | ICD-10-CM | POA: Diagnosis not present

## 2019-02-02 NOTE — Progress Notes (Signed)
She presents today for follow-up of her painful onychomycosis.  She states that her ulceration has come back to the plantar aspect of the forefoot left.  Denies fever chills nausea vomiting muscle aches and pains.  Objective: Vital signs are stable alert oriented x3.  Toenails are long thick yellow dystrophic with mycotic pulses are palpable.  Open lesion measuring 4 6 mm in diameter plantar medial aspect of the hallux interphalangeal joint left foot.  Appears to be clean no purulence no malodor but I did debrided to bleeding.  Assessment: Diabetic ulceration hallux left.  Onychomycosis 1 through 5 bilaterally.  Plan: Debridement of toenails 1 through 5 bilateral debrided this chronic ulceration which is patient will continue to take care of regular manner.

## 2019-02-15 ENCOUNTER — Other Ambulatory Visit: Payer: Self-pay | Admitting: Cardiovascular Disease

## 2019-05-04 ENCOUNTER — Ambulatory Visit: Payer: Medicare Other | Admitting: Podiatry

## 2019-05-16 ENCOUNTER — Encounter: Payer: Self-pay | Admitting: Internal Medicine

## 2019-05-26 ENCOUNTER — Other Ambulatory Visit: Payer: Self-pay | Admitting: Cardiovascular Disease

## 2019-06-01 ENCOUNTER — Other Ambulatory Visit: Payer: Self-pay

## 2019-06-01 ENCOUNTER — Ambulatory Visit: Payer: Medicare Other | Admitting: Podiatry

## 2019-06-01 ENCOUNTER — Encounter: Payer: Self-pay | Admitting: Podiatry

## 2019-06-01 DIAGNOSIS — M79676 Pain in unspecified toe(s): Secondary | ICD-10-CM | POA: Diagnosis not present

## 2019-06-01 DIAGNOSIS — B351 Tinea unguium: Secondary | ICD-10-CM

## 2019-06-01 DIAGNOSIS — L97521 Non-pressure chronic ulcer of other part of left foot limited to breakdown of skin: Secondary | ICD-10-CM

## 2019-06-01 NOTE — Progress Notes (Signed)
She presents today for follow-up of ulceration hallux left.  States it seems to be doing better.  She is also complaining of painful elongated toenails.  Objective: Vital signs are stable she is alert and oriented x3.  Pulses are palpable.  Toenails are long thick yellow dystrophic-like mycotic.  She also has reactive hyper keratoma subhallux interphalangeal joint left foot.  Was debrided does not demonstrate any bleeding.  Assessment: Pain in limb secondary to ulceration forefoot left and painful mycotic nails.  Plan: Debridement of all reactive hyperkeratotic tissue no iatrogenic lesions noted toenails were trimmed today 1 through 5 bilaterally covered service secondary to pain follow-up with her as needed.

## 2019-07-03 ENCOUNTER — Telehealth: Payer: Self-pay | Admitting: Cardiovascular Disease

## 2019-07-03 NOTE — Telephone Encounter (Signed)
LMTCB regarding pts vaccine question

## 2019-07-03 NOTE — Telephone Encounter (Signed)
New Message:3      Pt wants to know what vaccine does Dr Acie Fredrickson recommend?

## 2019-07-03 NOTE — Telephone Encounter (Signed)
informed the patient of the following on our phone call. She thanked me for the information.   We are recommending the COVID-19 vaccine to all of our patients. Cardiac medications (including blood thinners) should not deter anyone from being vaccinated and there is no need to hold any of those medications prior to vaccine administration.  Currently, there is a hotline to call (active 06/30/19) to schedule vaccination appointments as no walk-ins will be accepted.  Number: [phone]  If you have further questions or concerns about the vaccine process, please visit [email or web address] or contact your primary care physician.

## 2019-07-04 ENCOUNTER — Other Ambulatory Visit: Payer: Self-pay | Admitting: Obstetrics and Gynecology

## 2019-07-04 DIAGNOSIS — Z1231 Encounter for screening mammogram for malignant neoplasm of breast: Secondary | ICD-10-CM

## 2019-07-12 ENCOUNTER — Ambulatory Visit: Payer: Medicare PPO | Attending: Internal Medicine

## 2019-07-12 DIAGNOSIS — Z23 Encounter for immunization: Secondary | ICD-10-CM | POA: Insufficient documentation

## 2019-07-12 NOTE — Progress Notes (Signed)
   [facility]  Name:  Kristin TUNNEY, PhD    MRN: [id number] DOB: [birth year]  07/12/2019  [name] was observed post Covid-19 immunization for 15 minutes without incidence. She was provided with Vaccine Information Sheet and instruction to access the V-Safe system.   [name] was instructed to call 911 with any severe reactions post vaccine: Marland Kitchen Difficulty breathing  . Swelling of your face and throat  . A fast heartbeat  . A bad rash all over your body  . Dizziness and weakness    Immunizations Administered    Name Date Dose VIS Date Route   Pfizer COVID-19 Vaccine 07/12/2019  2:27 PM 0.3 mL 06/02/2019 Intramuscular   Manufacturer: [place]   Lot: [id number]   [place]: [id number]

## 2019-07-19 ENCOUNTER — Encounter: Payer: Medicare Other | Admitting: Internal Medicine

## 2019-08-03 ENCOUNTER — Ambulatory Visit: Payer: Medicare PPO | Attending: Internal Medicine

## 2019-08-03 DIAGNOSIS — Z23 Encounter for immunization: Secondary | ICD-10-CM | POA: Insufficient documentation

## 2019-08-03 NOTE — Progress Notes (Signed)
   [facility]  Name:  OLUWANIFEMI PELISSIER, PhD    MRN: [id number] DOB: May 02, 1941  08/03/2019  Ms. Tani was observed post Covid-19 immunization for 15 minutes without incidence. She was provided with Vaccine Information Sheet and instruction to access the V-Safe system.   Ms. Leverton was instructed to call 911 with any severe reactions post vaccine: Marland Kitchen Difficulty breathing  . Swelling of your face and throat  . A fast heartbeat  . A bad rash all over your body  . Dizziness and weakness    Immunizations Administered    Name Date Dose VIS Date Route   Pfizer COVID-19 Vaccine 08/03/2019  8:14 AM 0.3 mL 06/02/2019 Intramuscular   Manufacturer: [place]   Lot: [id number]   [place]: [id number]

## 2019-08-31 ENCOUNTER — Ambulatory Visit: Payer: Medicare PPO | Admitting: Podiatry

## 2019-08-31 ENCOUNTER — Other Ambulatory Visit: Payer: Self-pay

## 2019-08-31 DIAGNOSIS — M79676 Pain in unspecified toe(s): Secondary | ICD-10-CM | POA: Diagnosis not present

## 2019-08-31 DIAGNOSIS — L97521 Non-pressure chronic ulcer of other part of left foot limited to breakdown of skin: Secondary | ICD-10-CM

## 2019-08-31 DIAGNOSIS — B351 Tinea unguium: Secondary | ICD-10-CM | POA: Diagnosis not present

## 2019-08-31 NOTE — Progress Notes (Signed)
She presents today chief complaint of painfully elongated toenails 1 through 5 bilateral.  Ulceration plantar medial aspect of the hallux left.  States that she has noticed no drainage no signs or symptoms of infection.  States that she has not been taking care of it as she should.  Objective: Vital signs are stable she alert and oriented x3 pulses are palpable.  Hallux valgus with hallux interphalangeal resulting in pressure point to this diabetic foot at the plantar medial aspect of the hallux interphalangeal joint left foot.  There is no erythema edema cellulitis drainage or odor.  Reactive hyperkeratotic tissue is present no fibrin deposition no granulation tissue.  Toenails are long thick yellow dystrophic-like mycotic painful palpation as well as debridement.  Assessment: Painful elongated toenails and chronic ulceration distal medial aspect of the hallux left.  Plan: Debridement of the wound today to bleeding and then we did once again discussed padding as well as dressing with Bactroban ointment and dressed a compressive dressing.  Also debrided toenails 1 through 5 bilaterally.  I will continue to watch her every 3 months and debride her if she needs to come in before then she will notify me.

## 2019-10-04 ENCOUNTER — Ambulatory Visit: Payer: Medicare Other

## 2019-10-04 ENCOUNTER — Ambulatory Visit
Admission: RE | Admit: 2019-10-04 | Discharge: 2019-10-04 | Disposition: A | Discharge status: Home / Self Care | Payer: Medicare PPO | Source: Ambulatory Visit | Attending: Obstetrics and Gynecology | Admitting: Obstetrics and Gynecology

## 2019-10-04 ENCOUNTER — Other Ambulatory Visit: Payer: Self-pay

## 2019-10-04 DIAGNOSIS — Z1231 Encounter for screening mammogram for malignant neoplasm of breast: Secondary | ICD-10-CM

## 2019-10-12 ENCOUNTER — Ambulatory Visit: Payer: Medicare PPO | Admitting: Cardiovascular Disease

## 2019-10-12 DIAGNOSIS — M25561 Pain in right knee: Secondary | ICD-10-CM | POA: Insufficient documentation

## 2019-10-31 ENCOUNTER — Other Ambulatory Visit: Payer: Self-pay

## 2019-10-31 MED ORDER — FUROSEMIDE 40 MG PO TABS: 40.0000 mg | ORAL_TABLET | Freq: Every day | ORAL | 2 refills | Status: DC

## 2019-11-02 ENCOUNTER — Encounter: Payer: Self-pay | Admitting: Cardiovascular Disease

## 2019-11-02 ENCOUNTER — Ambulatory Visit: Payer: Medicare PPO | Admitting: Cardiovascular Disease

## 2019-11-02 ENCOUNTER — Other Ambulatory Visit: Payer: Self-pay

## 2019-11-02 VITALS — BP 118/62 | HR 74 | Ht 66.0 in | Wt 231.5 lb

## 2019-11-02 DIAGNOSIS — I2510 Atherosclerotic heart disease of native coronary artery without angina pectoris: Secondary | ICD-10-CM | POA: Diagnosis not present

## 2019-11-02 DIAGNOSIS — I5042 Chronic combined systolic (congestive) and diastolic (congestive) heart failure: Secondary | ICD-10-CM

## 2019-11-02 LAB — BASIC METABOLIC PANEL
BUN/Creatinine Ratio: 18 (ref 12–28)
BUN: 16 mg/dL (ref 8–27)
CO2: 26 mmol/L (ref 20–29)
Calcium: 9.5 mg/dL (ref 8.7–10.3)
Chloride: 98 mmol/L (ref 96–106)
Creatinine, Ser: 0.91 mg/dL (ref 0.57–1.00)
GFR calc Af Amer: 71 mL/min/{1.73_m2} (ref >59)
GFR calc non Af Amer: 61 mL/min/{1.73_m2} (ref >59)
Glucose: 105 mg/dL — ABNORMAL HIGH (ref 65–99)
Potassium: 5.1 mmol/L (ref 3.5–5.2)
Sodium: 141 mmol/L (ref 134–144)

## 2019-11-02 LAB — HEPATIC FUNCTION PANEL
ALT: 14 IU/L (ref 0–32)
AST: 17 IU/L (ref 0–40)
Albumin: 4.3 g/dL (ref 3.7–4.7)
Alkaline Phosphatase: 125 IU/L — ABNORMAL HIGH (ref 39–117)
Bilirubin Total: 0.6 mg/dL (ref 0.0–1.2)
Bilirubin, Direct: 0.16 mg/dL (ref 0.00–0.40)
Total Protein: 6.9 g/dL (ref 6.0–8.5)

## 2019-11-02 LAB — LIPID PANEL
Chol/HDL Ratio: 3 ratio (ref 0.0–4.4)
Cholesterol, Total: 164 mg/dL (ref 100–199)
HDL: 55 mg/dL (ref >39)
LDL Chol Calc (NIH): 86 mg/dL (ref 0–99)
Triglycerides: 133 mg/dL (ref 0–149)
VLDL Cholesterol Cal: 23 mg/dL (ref 5–40)

## 2019-11-02 NOTE — Patient Instructions (Signed)
Medication Instructions:  Your provider recommends that you continue on your current medications as directed. Please refer to the Current Medication list given to you today.   *If you need a refill on your cardiac medications before your next appointment, please call your pharmacy*  Lab Work: TODAY! Lipid, liver, BMET If you have labs (blood work) drawn today and your tests are completely normal, you will receive your results only by: Marland Kitchen MyChart Message (if you have MyChart) OR . A paper copy in the mail If you have any lab test that is abnormal or we need to change your treatment, we will call you to review the results.  Testing/Procedures: Your provider has requested that you have an echocardiogram. Echocardiography is a painless test that uses sound waves to create images of your heart. It provides your doctor with information about the size and shape of your heart and how well your heart's chambers and valves are working. This procedure takes approximately one hour. There are no restrictions for this procedure.     Follow-Up: At [facility], you and your health needs are our priority.  As part of our continuing mission to provide you with exceptional heart care, we have created designated Provider Care Teams.  These Care Teams include your primary Cardiologist (physician) and Advanced Practice Providers (APPs -  Physician Assistants and Nurse Practitioners) who all work together to provide you with the care you need, when you need it. Your next appointment:   6 month(s) The format for your next appointment:   In Person Provider:   You may see Mertie Moores, MD or one of the following Advanced Practice Providers on your designated Care Team:    Richardson Dopp, PA-C  [place], [place]

## 2019-11-02 NOTE — Progress Notes (Signed)
Cardiology Office Note   Date:  11/02/2019   ID:  Kristin Ina, Kristin Mack, DOB 1940/08/03, MRN [id number]  PCP:  Leanna Battles, MD  Cardiologist:   Mertie Moores, MD   No chief complaint on file.  1. CHF- LV EF has improved dramatically 2. Hyperlipidemia 3. Left hip replacement   79 year old female with history of congestive heart failure. Her original ejection fraction was 10-20%. Her ejection fraction has dramatically improved and is now 57% ( Echo 2009). She had an echocardiogram in February 2011 that revealed normal left ventricle systolic function.  She's had 2 heart catheterizations. Her last cardiac cath was in 2006 which revealed moderate irregularities.  She's not having any episodes of chest pain or shortness breath. She works out a regular basis at least 2-3 times a week.  She has gained 20 lbs over the past several months. She has a foot ulcer in her left foot and has not been in the pool.  She's not had any angina-like chest pain. She did have some stress-related chest discomfort around Christmastime but that has since resolved.  October 18, 2012:  Kristin Mack has done well for the past 6 months. She has not had any dyspnea. She has had lots of left hip pain and has not been able to exercise as much. She is swimming 4 times a week.  Nov. 24, 2014:  Kristin Mack is doing well. She has been having some acid reflux ( dry cough).  She has been trying eating smaller portions, earing earlier in the evening, avoiding spicy foods.   May 27,2015: Kristin Mack is doing well. No CP or dyspnea.  Stressed out over family issues. Her sons were living with her for several months. She has gone up on her Effexor.  She has had some left thigh problems.   Dec. 1, 2015:  Kristin Mack is seen today for follow up of her CHF. She has done well.  She has not been swimming as much recently - has an ulcer on the bottom of her left great toe.   Oct 30, 2014:  Kristin Ina,  Kristin Mack is a 79 y.o. female who presents for  Follow up of her CHF. Is getting over a cold. Has gained some weight.  Is back exercising.  Does water exercises.  Likes to go line dancing .   No CP , breathing is ok.  Has lots of stress with her 27 yo son.   Nov. 8,  2016:  Doing well from a cardiac standpoint.  Lots of issues with GERD. Lots of stress ,    No Cp or dyspnea.   November 22, 2015:  Doing well.  Has gained 13 lbs since her last OV in Nov.  She is very frustrated with her 23 year old son who still lives in the house with her.   Breathing is good.   Swim 3 times  a week.    Dec. 6, 2017  Kristin Mack is seen today for a preoperative evaluation. She needs to have a right shoulder surgery and right carpal tunnel surgery.  She is doing well from a cardiac standpoint.   Has brief Chest pain / left shoulder pain .    Pains last for 2-3 minutes.   Not related to exercise - may occur after swimming.    Sept, 4, 2018:  Kristin Mack is seen today for follow up of her CAD Breathing is good .  Has been eating a bit of extra salt.   Going to the  dentist  Myoview study in Dec. 2017 shows a fixed apical defect thought to be breast attenuation.  Has not had any cp. Did not have the shoulder surgery   Nov 08, 2017: Today for follow-up of her coronary artery disease.  She is had some systolic congestive heart failure in the past but is overall doing great currently. No CP or dyspnea Has an ulcer on her left great toe.  Started as a callous which she clipped off with sissors .   Got infected.  She met up with an old boyfriend , walked quite a bit at the [place]  No CP   Is not avoiding salt as much as she should .    Wt = 230 today   May 03, 2018: Kristin Mack seen today for follow-up visit. Weight today is 228 pounds. Has not been exercising in the pool  ( due to toe ulcer )  Its now healed.  Breathing is ok She was on the incorrect dose of Lisinopril for several months Now is on the right  dose.  Has some atypical CP with arm movement -  No angina   January 02, 2019:  Kristin Mack is doing well.   Has not been swimming Has missed the exercise,   Has been eating more take out food Admits to eating more salt than she should  Nov 02, 2019:  Kristin Mack is doing well.  n  Wt today is 231 lbs.  Continues to have issues with her right knee.  No CP or dyspnea.   Is not getting as much exercise as she should .   Overall is doing well .  No cp or dyspnea   Past Medical History:  Diagnosis Date  . Carpal tunnel syndrome    Left Hand  . Chest pain   . CHF (congestive heart failure) (HCC)    Dilated cardiomyopathy. Her original ejection fraction was 10-20%. Her ejection fraction has improved to 57% by 2009  . Coronary artery disease   . Depression   . Dyslipidemia   . Foot drop, left   . GERD (gastroesophageal reflux disease)   . Headache(784.0)   . Hemorrhoids   . History of anxiety   . Hypertension   . Lower back pain   . Myocardial infarction ([place])   . Obesity   . Osteoarthritis    Severe with collapse of the left hip    Past Surgical History:  Procedure Laterality Date  . BREAST BIOPSY Right    x2  . CARDIAC CATHETERIZATION     Ejection Fraction was around 45-50%  . HYSTEROSCOPY  09/03/2011   Procedure: HYSTEROSCOPY;  Surgeon: Logan Bores, MD;  Location: [facility];  Service: Gynecology;  Laterality: N/A;  With Resection of polyp with truclear system  . REPLACEMENT TOTAL HIP W/  RESURFACING IMPLANTS  2002   left  . TUBAL LIGATION       Current Outpatient Medications  Medication Sig Dispense Refill  . ACID CONTROLLER MAX ST 20 MG tablet TAKE 1 TABLET BY MOUTH BID    . amoxicillin (AMOXIL) 500 MG capsule Take 4 capsules by mouth as needed. 1 hr prior to dental work    . aspirin 81 MG tablet Take 81 mg by mouth daily.     Marland Kitchen atorvastatin (LIPITOR) 40 MG tablet Take 40 mg by mouth daily.  1  . calcium carbonate (OS-CAL - DOSED IN MG OF ELEMENTAL CALCIUM) 1250 (500  Ca) MG tablet Take 1 tablet by mouth  daily with breakfast.    . calcium citrate-vitamin D (CITRACAL+D) 315-200 MG-UNIT tablet Take by mouth.    . carvedilol (COREG) 12.5 MG tablet TAKE 1 TABLET BY MOUTH TWICE DAILY WITH A MEAL 180 tablet 2  . clobetasol cream (TEMOVATE) 0.21 % Apply 1 application topically 2 (two) times daily.     . furosemide (LASIX) 40 MG tablet Take 1 tablet (40 mg total) by mouth daily. 90 tablet 2  . latanoprost (XALATAN) 0.005 % ophthalmic solution Place 1 drop into both eyes at bedtime.  0  . LORazepam (ATIVAN) 0.5 MG tablet Take 0.5 mg by mouth daily as needed for anxiety.     Marland Kitchen losartan (COZAAR) 50 MG tablet Take 50 mg by mouth daily. On Monday Wednesday and Friday    . metoCLOPramide (REGLAN) 5 MG tablet TAKE 1 TABLET BY MOUTH AT BEDTIME FOR REFLUX    . Multiple Vitamin (MULTI-VITAMIN PO) Take 1 tablet by mouth daily.     Marland Kitchen NAPROXEN PO Take 625 mg by mouth as needed.    . nitroGLYCERIN (NITROSTAT) 0.4 MG SL tablet PLACE 1 TABLET UNDER THE TONGUE IF NEEDED EVERY 5 MINUTES FOR CHEST PAIN 25 tablet 5  . pantoprazole (PROTONIX) 40 MG tablet Take 40 mg by mouth 2 (two) times daily.     Marland Kitchen PRESCRIPTION MEDICATION Place 1 mL vaginally as directed. inser 1 ml vaginally 2-3 times per week as directed  Estradiol Micronized  0.02% cream (is compounded for patient)    . risperiDONE (RISPERDAL) 1 MG tablet Take 1 tablet by mouth as needed.    . venlafaxine XR (EFFEXOR-XR) 75 MG 24 hr capsule Take 3 capsules by mouth daily.    . Vitamin D, Ergocalciferol, (DRISDOL) 50000 UNITS CAPS Take 50,000 Units by mouth every 30 (thirty) days.      No current facility-administered medications for this visit.    Allergies:   Nickel    Social History:  The patient  reports that she has never smoked. She has never used smokeless tobacco. She reports that she does not drink alcohol or use drugs.   Family History:  The patient's family history includes Cancer in her sister; Stroke in her  maternal grandfather and mother.    ROS:   Noted in current history, otherwise review of systems is negative.   Physical Exam: Blood pressure 118/62, pulse 74, height _0  (1.676 m), weight 231 lb 8 oz (105 kg), SpO2 95 %.  GEN:  Well nourished, well developed in no acute distress HEENT: Normal NECK: No JVD; No carotid bruits LYMPHATICS: No lymphadenopathy CARDIAC: RRR , no murmurs, rubs, gallops RESPIRATORY:  Clear to auscultation without rales, wheezing or rhonchi  ABDOMEN: Soft, non-tender, non-distended MUSCULOSKELETAL:  No edema; No deformity  SKIN: Warm and dry NEUROLOGIC:  Alert and oriented x 3    EKG:    Nov 02, 2019: Normal sinus rhythm at 76.  Occasional premature ventricular contractions.   Recent Labs: No results found for requested labs within last 8760 hours.    Lipid Panel    Component Value Date/Time   CHOL 170 05/03/2018 1156   TRIG 97 05/03/2018 1156   HDL 62 05/03/2018 1156   CHOLHDL 2.7 05/03/2018 1156   CHOLHDL 2.7 05/27/2016 1044   VLDL 23 05/27/2016 1044   LDLCALC 89 05/03/2018 1156      Wt Readings from Last 3 Encounters:  11/02/19 231 lb 8 oz (105 kg)  01/02/19 227 lb 1.9 oz (103 kg)  05/03/18 228  lb (103.4 kg)      Other studies Reviewed: Additional studies/ records that were reviewed today include: . Review of the above records demonstrates:    ASSESSMENT AND PLAN:  1. Chronic systolic CHF-      .  Coronary is doing very well.  Continue current medications.  Her last echo was 2014.  We will update her echocardiogram.  2. CAD : She is not having any episodes of angina.  Continue current medications.      3. Hyperlipidemia -    we will recheck her lipids today.  Continue atorvastatin 40 mg a day. She is gained a little bit of weight.  I advised her to work on avoiding carbohydrates such as breads pastas potatoes and desserts. 3. Left hip replacement        Current medicines are reviewed at length with the patient today.  The  patient does not have concerns regarding medicines.  The following changes have been made:  no change  Labs/ tests ordered today include:   No orders of the defined types were placed in this encounter.   Disposition:   FU with me in 6 months      Mertie Moores, MD  11/02/2019 10:12 AM    [facility] [address], [place], [place]  [address] Phone: [phone]; Fax: [phone]

## 2019-11-03 ENCOUNTER — Encounter: Payer: Self-pay | Admitting: Cardiovascular Disease

## 2019-11-03 ENCOUNTER — Other Ambulatory Visit: Payer: Self-pay

## 2019-11-03 MED ORDER — CARVEDILOL 12.5 MG PO TABS: ORAL_TABLET | ORAL | 3 refills | Status: DC

## 2019-11-28 ENCOUNTER — Other Ambulatory Visit (HOSPITAL_COMMUNITY): Payer: Medicare PPO

## 2019-12-05 ENCOUNTER — Ambulatory Visit: Payer: Medicare PPO | Admitting: Podiatry

## 2019-12-13 ENCOUNTER — Ambulatory Visit (HOSPITAL_COMMUNITY): Payer: Medicare PPO | Attending: Internal Medicine

## 2019-12-13 ENCOUNTER — Other Ambulatory Visit: Payer: Self-pay

## 2019-12-13 DIAGNOSIS — I2510 Atherosclerotic heart disease of native coronary artery without angina pectoris: Secondary | ICD-10-CM

## 2019-12-13 DIAGNOSIS — I5042 Chronic combined systolic (congestive) and diastolic (congestive) heart failure: Secondary | ICD-10-CM

## 2019-12-15 ENCOUNTER — Telehealth: Payer: Self-pay | Admitting: Cardiovascular Disease

## 2019-12-15 NOTE — Telephone Encounter (Signed)
° ° °  Pt is returning call from [facility] to get echo result

## 2019-12-15 NOTE — Telephone Encounter (Signed)
I spoke with patient and reviewed echo results with her 

## 2019-12-20 ENCOUNTER — Ambulatory Visit: Payer: Medicare PPO | Admitting: Podiatry

## 2019-12-22 ENCOUNTER — Ambulatory Visit: Payer: Medicare PPO | Admitting: Podiatry

## 2019-12-22 ENCOUNTER — Other Ambulatory Visit: Payer: Self-pay

## 2019-12-22 ENCOUNTER — Encounter: Payer: Self-pay | Admitting: Podiatry

## 2019-12-22 DIAGNOSIS — M79676 Pain in unspecified toe(s): Secondary | ICD-10-CM | POA: Diagnosis not present

## 2019-12-22 DIAGNOSIS — B351 Tinea unguium: Secondary | ICD-10-CM | POA: Diagnosis not present

## 2019-12-22 DIAGNOSIS — L97521 Non-pressure chronic ulcer of other part of left foot limited to breakdown of skin: Secondary | ICD-10-CM | POA: Diagnosis not present

## 2019-12-22 NOTE — Progress Notes (Signed)
She presents today chief complaint of painfully elongated toenails bilaterally she is also complaining of ulceration plantar medial aspect of the hallux interphalangeal joint left foot.  States it seems to be doing okay.  Objective: Pulses remain palpable bilateral toenails are long thick yellow dystrophic with mycotic reactive hyperkeratotic tissue surrounding the ulcer lesion was debrided today measures about 3 mm in total diameter post debridement.  Debrided with sharp blade today no signs of purulence.  Assessment: Chronic ulceration hallux interphalangeal joint left.  Pain in limb secondary onychomycosis.  Plan: Debridement of all reactive hyperkeratotic tissue and ulcer.  Wrapped and dressed ulcer today.  I also debrided toenails 1 through 5 bilateral.

## 2020-01-09 ENCOUNTER — Ambulatory Visit: Payer: Medicare PPO | Admitting: Podiatry

## 2020-03-26 ENCOUNTER — Other Ambulatory Visit: Payer: Self-pay

## 2020-03-26 ENCOUNTER — Encounter: Payer: Self-pay | Admitting: Podiatry

## 2020-03-26 ENCOUNTER — Ambulatory Visit: Payer: Medicare PPO | Admitting: Podiatry

## 2020-03-26 DIAGNOSIS — Q828 Other specified congenital malformations of skin: Secondary | ICD-10-CM | POA: Diagnosis not present

## 2020-03-26 DIAGNOSIS — B351 Tinea unguium: Secondary | ICD-10-CM | POA: Diagnosis not present

## 2020-03-26 DIAGNOSIS — M79676 Pain in unspecified toe(s): Secondary | ICD-10-CM

## 2020-03-26 DIAGNOSIS — L97521 Non-pressure chronic ulcer of other part of left foot limited to breakdown of skin: Secondary | ICD-10-CM | POA: Diagnosis not present

## 2020-03-26 NOTE — Progress Notes (Signed)
She presents today for follow-up of ulceration hallux left.  She also is presenting for painful elongated toenails.  Objective: Toenails are long thick yellow dystrophic-like mycotic there is no erythema edema cellulitis drainage odor appears to be healing regularly.  Assessment: Pain in limb secondary chronic ulceration hallux left.  This is noncomplicated.  Pain in limb secondary to onychomycosis.  Plan: Treatment of the reactive hyperkeratotic lesion surrounding the ulceration today debriding the ulceration and increasing size to about 4 mm in total diameter with a 15 blade.  Tolerated procedure well.  Rated toenails 1 through 5 bilaterally.  Continue to treat the ulceration on a daily basis with a dressing.  Dispensed silicone pads.

## 2020-04-03 DIAGNOSIS — F429 Obsessive-compulsive disorder, unspecified: Secondary | ICD-10-CM | POA: Diagnosis not present

## 2020-04-17 DIAGNOSIS — F429 Obsessive-compulsive disorder, unspecified: Secondary | ICD-10-CM | POA: Diagnosis not present

## 2020-04-18 DIAGNOSIS — H2511 Age-related nuclear cataract, right eye: Secondary | ICD-10-CM | POA: Diagnosis not present

## 2020-04-18 DIAGNOSIS — H25811 Combined forms of age-related cataract, right eye: Secondary | ICD-10-CM | POA: Diagnosis not present

## 2020-04-25 DIAGNOSIS — H25812 Combined forms of age-related cataract, left eye: Secondary | ICD-10-CM | POA: Diagnosis not present

## 2020-04-25 DIAGNOSIS — H2512 Age-related nuclear cataract, left eye: Secondary | ICD-10-CM | POA: Diagnosis not present

## 2020-04-30 ENCOUNTER — Ambulatory Visit: Payer: Medicare PPO | Admitting: Cardiovascular Disease

## 2020-04-30 ENCOUNTER — Ambulatory Visit: Payer: Medicare PPO | Admitting: Physician Assistant

## 2020-05-01 DIAGNOSIS — F429 Obsessive-compulsive disorder, unspecified: Secondary | ICD-10-CM | POA: Diagnosis not present

## 2020-05-10 ENCOUNTER — Ambulatory Visit: Payer: Medicare PPO | Admitting: Cardiovascular Disease

## 2020-05-10 ENCOUNTER — Encounter: Payer: Self-pay | Admitting: Cardiovascular Disease

## 2020-05-10 ENCOUNTER — Other Ambulatory Visit: Payer: Self-pay

## 2020-05-10 VITALS — BP 116/66 | HR 75 | Ht 66.0 in | Wt 212.0 lb

## 2020-05-10 DIAGNOSIS — I5042 Chronic combined systolic (congestive) and diastolic (congestive) heart failure: Secondary | ICD-10-CM

## 2020-05-10 DIAGNOSIS — E785 Hyperlipidemia, unspecified: Secondary | ICD-10-CM

## 2020-05-10 DIAGNOSIS — I2510 Atherosclerotic heart disease of native coronary artery without angina pectoris: Secondary | ICD-10-CM | POA: Diagnosis not present

## 2020-05-10 MED ORDER — NITROGLYCERIN 0.4 MG SL SUBL: SUBLINGUAL_TABLET | SUBLINGUAL | 5 refills | Status: DC

## 2020-05-10 NOTE — Progress Notes (Signed)
Cardiology Office Note   Date:  05/10/2020   ID:  Kristin Ina, Kristin Mack, DOB [birth year], MRN [id number]  PCP:  Leanna Battles, MD  Cardiologist:   Mertie Moores, MD   Chief Complaint  Patient presents with  . Coronary Artery Disease  . Congestive Heart Failure   1. CHF- LV EF has improved dramatically 2. Hyperlipidemia 3. Left hip replacement   79 year old female with history of congestive heart failure. Her original ejection fraction was 10-20%. Her ejection fraction has dramatically improved and is now 57% ( Echo 2009). She had an echocardiogram in February 2011 that revealed normal left ventricle systolic function.  She's had 2 heart catheterizations. Her last cardiac cath was in 2006 which revealed moderate irregularities.  She's not having any episodes of chest pain or shortness breath. She works out a regular basis at least 2-3 times a week.  She has gained 20 lbs over the past several months. She has a foot ulcer in her left foot and has not been in the pool.  She's not had any angina-like chest pain. She did have some stress-related chest discomfort around Christmastime but that has since resolved.  October 18, 2012:  Kristin Mack has done well for the past 6 months. She has not had any dyspnea. She has had lots of left hip pain and has not been able to exercise as much. She is swimming 4 times a week.  Nov. 24, 2014:  Kristin Mack is doing well. She has been having some acid reflux ( dry cough).  She has been trying eating smaller portions, earing earlier in the evening, avoiding spicy foods.   May 27,2015: Kristin Mack is doing well. No CP or dyspnea.  Stressed out over family issues. Her sons were living with her for several months. She has gone up on her Effexor.  She has had some left thigh problems.   Dec. 1, 2015:  Kristin Mack is seen today for follow up of her CHF. She has done well.  She has not been swimming as much recently - has an ulcer on the  bottom of her left great toe.   Oct 30, 2014:  Kristin Ina, Kristin Mack is a 79 y.o. female who presents for  Follow up of her CHF. Is getting over a cold. Has gained some weight.  Is back exercising.  Does water exercises.  Likes to go line dancing .   No CP , breathing is ok.  Has lots of stress with her 24 yo son.   Nov. 8,  2016:  Doing well from a cardiac standpoint.  Lots of issues with GERD. Lots of stress ,    No Cp or dyspnea.   November 22, 2015:  Doing well.  Has gained 13 lbs since her last OV in Nov.  She is very frustrated with her 59 year old son who still lives in the house with her.   Breathing is good.   Swim 3 times  a week.    Dec. 6, 2017  Kristin Mack is seen today for a preoperative evaluation. She needs to have a right shoulder surgery and right carpal tunnel surgery.  She is doing well from a cardiac standpoint.   Has brief Chest pain / left shoulder pain .    Pains last for 2-3 minutes.   Not related to exercise - may occur after swimming.    Sept, 4, 2018:  Kristin Mack is seen today for follow up of her CAD Breathing is good .  Has  been eating a bit of extra salt.   Going to the dentist  Myoview study in Dec. 2017 shows a fixed apical defect thought to be breast attenuation.  Has not had any cp. Did not have the shoulder surgery   Nov 08, 2017: Today for follow-up of her coronary artery disease.  She is had some systolic congestive heart failure in the past but is overall doing great currently. No CP or dyspnea Has an ulcer on her left great toe.  Started as a callous which she clipped off with sissors .   Got infected.  She met up with an old boyfriend , walked quite a bit at the [place]  No CP   Is not avoiding salt as much as she should .    Wt = 230 today   May 03, 2018: Kristin Mack seen today for follow-up visit. Weight today is 228 pounds. Has not been exercising in the pool  ( due to toe ulcer )  Its now healed.  Breathing is ok She was on  the incorrect dose of Lisinopril for several months Now is on the right dose.  Has some atypical CP with arm movement -  No angina   January 02, 2019:  Kristin Mack is doing well.   Has not been swimming Has missed the exercise,   Has been eating more take out food Admits to eating more salt than she should  Nov 02, 2019:  Kristin Mack is doing well.  n  Wt today is 231 lbs.  Continues to have issues with her right knee.  No CP or dyspnea.   Is not getting as much exercise as she should .   Overall is doing well .  No cp or dyspnea   May 10, 2020:  Kristin Mack is seen today for a follow-up visit of her congestive heart failure.  Her weight today is 212 pounds. Has lost 20 lbs since last visit  Lisinopril has been changed to Losartan  Is avoiding the [facility] pool since she had cataract surgery  Breathing is ok.   Echocardiogram from June, 2021 reveals mildly depressed left ventricular systolic function with an ejection fraction of 45 to 50%.  This EF is unchanged from her previous echo in 2014.  She has grade 1 diastolic dysfunction.  Past Medical History:  Diagnosis Date  . Carpal tunnel syndrome    Left Hand  . Chest pain   . CHF (congestive heart failure) (HCC)    Dilated cardiomyopathy. Her original ejection fraction was 10-20%. Her ejection fraction has improved to 57% by 2009  . Coronary artery disease   . Depression   . Dyslipidemia   . Foot drop, left   . GERD (gastroesophageal reflux disease)   . Headache(784.0)   . Hemorrhoids   . History of anxiety   . Hypertension   . Lower back pain   . Myocardial infarction ([place])   . Obesity   . Osteoarthritis    Severe with collapse of the left hip  . Single functional kidney    single right kidney,  left kidney is atrophic    Past Surgical History:  Procedure Laterality Date  . BREAST BIOPSY Right    x2  . CARDIAC CATHETERIZATION     Ejection Fraction was around 45-50%  . HYSTEROSCOPY  09/03/2011   Procedure: HYSTEROSCOPY;   Surgeon: Logan Bores, MD;  Location: [facility];  Service: Gynecology;  Laterality: N/A;  With Resection of polyp with truclear system  .  REPLACEMENT TOTAL HIP W/  RESURFACING IMPLANTS  2002   left  . TUBAL LIGATION       Current Outpatient Medications  Medication Sig Dispense Refill  . ACID CONTROLLER MAX ST 20 MG tablet TAKE 1 TABLET BY MOUTH BID    . amoxicillin (AMOXIL) 500 MG capsule Take 4 capsules by mouth as needed. 1 hr prior to dental work    . aspirin 81 MG tablet Take 81 mg by mouth daily.     Marland Kitchen atorvastatin (LIPITOR) 40 MG tablet Take 40 mg by mouth daily.  1  . calcium carbonate (OS-CAL - DOSED IN MG OF ELEMENTAL CALCIUM) 1250 (500 Ca) MG tablet Take 1 tablet by mouth daily with breakfast. Alternates between calcium  medication    . calcium citrate-vitamin D (CITRACAL+D) 315-200 MG-UNIT tablet Take by mouth.    . carvedilol (COREG) 12.5 MG tablet TAKE 1 TABLET BY MOUTH TWICE DAILY WITH A MEAL 180 tablet 3  . clobetasol cream (TEMOVATE) 2.63 % Apply 1 application topically 2 (two) times daily.     . furosemide (LASIX) 40 MG tablet Take 1 tablet (40 mg total) by mouth daily. 90 tablet 2  . latanoprost (XALATAN) 0.005 % ophthalmic solution Place 1 drop into both eyes at bedtime.  0  . LORazepam (ATIVAN) 0.5 MG tablet Take 0.5 mg by mouth as needed for anxiety.     Marland Kitchen losartan (COZAAR) 50 MG tablet Take 50 mg by mouth daily. On Monday Wednesday and Friday    . metoCLOPramide (REGLAN) 5 MG tablet TAKE 1 TABLET BY MOUTH AT BEDTIME FOR REFLUX    . Multiple Vitamin (MULTI-VITAMIN PO) Take 1 tablet by mouth daily.     Marland Kitchen NAPROXEN PO Take 625 mg by mouth as needed.    . nitroGLYCERIN (NITROSTAT) 0.4 MG SL tablet PLACE 1 TABLET UNDER THE TONGUE IF NEEDED EVERY 5 MINUTES FOR CHEST PAIN 25 tablet 5  . PRESCRIPTION MEDICATION Place 1 mL vaginally as directed. inser 1 ml vaginally 2-3 times per week as directed  Estradiol Micronized  0.02% cream (is compounded for patient)    . risperiDONE  (RISPERDAL) 1 MG tablet Take 1 tablet by mouth as needed.    . venlafaxine XR (EFFEXOR-XR) 75 MG 24 hr capsule Take 3 capsules by mouth daily.    . Vitamin D, Ergocalciferol, (DRISDOL) 50000 UNITS CAPS Take 50,000 Units by mouth every 30 (thirty) days.      No current facility-administered medications for this visit.    Allergies:   Nickel    Social History:  The patient  reports that she has never smoked. She has never used smokeless tobacco. She reports that she does not drink alcohol and does not use drugs.   Family History:  The patient's family history includes Cancer in her sister; Stroke in her maternal grandfather and mother.    ROS:   Noted in current history, otherwise review of systems is negative.  Physical Exam: Blood pressure 116/66, pulse 75, height _0  (1.676 m), weight 212 lb (96.2 kg), SpO2 97 %.  GEN:  Well nourished, well developed in no acute distress HEENT: Normal NECK: No JVD; No carotid bruits LYMPHATICS: No lymphadenopathy CARDIAC: RRR , no murmurs, rubs, gallops RESPIRATORY:  Clear to auscultation without rales, wheezing or rhonchi  ABDOMEN: Soft, non-tender, non-distended MUSCULOSKELETAL:  No edema; No deformity  SKIN: Warm and dry NEUROLOGIC:  Alert and oriented x 3    EKG:       Recent Labs:  11/02/2019: ALT 14; BUN 16; Creatinine, Ser 0.91; Potassium 5.1; Sodium 141    Lipid Panel    Component Value Date/Time   CHOL 164 11/02/2019 1037   TRIG 133 11/02/2019 1037   HDL 55 11/02/2019 1037   CHOLHDL 3.0 11/02/2019 1037   CHOLHDL 2.7 05/27/2016 1044   VLDL 23 05/27/2016 1044   LDLCALC 86 11/02/2019 1037      Wt Readings from Last 3 Encounters:  05/10/20 212 lb (96.2 kg)  11/02/19 231 lb 8 oz (105 kg)  01/02/19 227 lb 1.9 oz (103 kg)      Other studies Reviewed: Additional studies/ records that were reviewed today include: . Review of the above records demonstrates:    ASSESSMENT AND PLAN:  1. Chronic systolic CHF-        Kristin Mack seems to be doing well.  Her ejection fraction is basically unchanged from her previous echocardiogram in 2014.  She seems to be doing well.  She is been able to exercise and would lose some weight.  We will continue with her same medications.  We will draw a basic metabolic profile today since she is been started on losartan instead of lisinopril.  I anticipate that her kidney function will remain unchanged.  2. CAD :    Mild CAD , no angian   3. Hyperlipidemia -     She is now on atorvastatin 40 mg.  Will check lipids, liver enzymes, basic metabolic profile.  3. Left hip replacement        Current medicines are reviewed at length with the patient today.  The patient does not have concerns regarding medicines.  The following changes have been made:  no change  Labs/ tests ordered today include:   No orders of the defined types were placed in this encounter.   Disposition:   FU with me in 6 months      Mertie Moores, MD  05/10/2020 3:56 PM    [address] [place], [place], [place]  [address] Phone: [phone]; Fax: [phone]

## 2020-05-10 NOTE — Patient Instructions (Addendum)
Medication Instructions:  Your physician recommends that you continue on your current medications as directed. Please refer to the Current Medication list given to you today.  *If you need a refill on your cardiac medications before your next appointment, please call your pharmacy*   Lab Work: TODAY - basic metabolic panel, cholesterol, liver panel  If you have labs (blood work) drawn today and your tests are completely normal, you will receive your results only by: Marland Kitchen MyChart Message (if you have MyChart) OR . A paper copy in the mail If you have any lab test that is abnormal or we need to change your treatment, we will call you to review the results.   Testing/Procedures: None Ordered   Follow-Up: At [facility], you and your health needs are our priority.  As part of our continuing mission to provide you with exceptional heart care, we have created designated Provider Care Teams.  These Care Teams include your primary Cardiologist (physician) and Advanced Practice Providers (APPs -  Physician Assistants and Nurse Practitioners) who all work together to provide you with the care you need, when you need it.  We recommend signing up for the patient portal called "MyChart".  Sign up information is provided on this After Visit Summary.  MyChart is used to connect with patients for Virtual Visits (Telemedicine).  Patients are able to view lab/test results, encounter notes, upcoming appointments, etc.  Non-urgent messages can be sent to your provider as well.   To learn more about what you can do with MyChart, go to [email or web address].    Your next appointment:   6 month(s)  The format for your next appointment:   In Person  Provider:   You will see one of the following Advanced Practice Providers on your designated Care Team:    Richardson Dopp, PA-C  [place], [place]

## 2020-05-11 LAB — BASIC METABOLIC PANEL
BUN/Creatinine Ratio: 16 (ref 12–28)
BUN: 14 mg/dL (ref 8–27)
CO2: 30 mmol/L — ABNORMAL HIGH (ref 20–29)
Calcium: 9.4 mg/dL (ref 8.7–10.3)
Chloride: 97 mmol/L (ref 96–106)
Creatinine, Ser: 0.87 mg/dL (ref 0.57–1.00)
GFR calc Af Amer: 74 mL/min/{1.73_m2} (ref >59)
GFR calc non Af Amer: 64 mL/min/{1.73_m2} (ref >59)
Glucose: 93 mg/dL (ref 65–99)
Potassium: 4.2 mmol/L (ref 3.5–5.2)
Sodium: 138 mmol/L (ref 134–144)

## 2020-05-11 LAB — LIPID PANEL
Chol/HDL Ratio: 2.9 ratio (ref 0.0–4.4)
Cholesterol, Total: 162 mg/dL (ref 100–199)
HDL: 55 mg/dL (ref >39)
LDL Chol Calc (NIH): 76 mg/dL (ref 0–99)
Triglycerides: 187 mg/dL — ABNORMAL HIGH (ref 0–149)
VLDL Cholesterol Cal: 31 mg/dL (ref 5–40)

## 2020-05-11 LAB — HEPATIC FUNCTION PANEL
ALT: 15 IU/L (ref 0–32)
AST: 20 IU/L (ref 0–40)
Albumin: 4.5 g/dL (ref 3.7–4.7)
Alkaline Phosphatase: 97 IU/L (ref 44–121)
Bilirubin Total: 0.4 mg/dL (ref 0.0–1.2)
Bilirubin, Direct: 0.16 mg/dL (ref 0.00–0.40)
Total Protein: 6.9 g/dL (ref 6.0–8.5)

## 2020-05-13 ENCOUNTER — Telehealth: Payer: Self-pay

## 2020-05-13 NOTE — Telephone Encounter (Signed)
-----   Message from Thayer Headings, MD sent at 05/13/2020  1:30 PM EST ----- Labs are stable

## 2020-05-13 NOTE — Telephone Encounter (Signed)
The patient has been notified of the result and verbalized understanding.  All questions (if any) were answered.  Copy of lab results placed in mail per patient request.  Wilma Flavin, RN 05/13/2020 1:39 PM

## 2020-05-21 ENCOUNTER — Encounter: Payer: Self-pay | Admitting: Cardiology

## 2020-05-21 ENCOUNTER — Telehealth: Payer: Self-pay | Admitting: Cardiovascular Disease

## 2020-05-21 NOTE — Telephone Encounter (Signed)
    Pt does not need antibiotics prior to dental procedures.  She has non obstructive CAD.

## 2020-05-21 NOTE — Telephone Encounter (Signed)
Pt called in and would like to know if she need if she needs to be on a antibiotic for a reg cleaning at the dentist.  If not she will need document stating she does not need meds before.    Best number [phone]  Appt is at 1:30 and she I leaving [place] at 11:30 and would need this before she goes

## 2020-05-21 NOTE — Telephone Encounter (Signed)
S/w pt. Informed the pt she will not need SBE prior to dental work per Dr. Acie Fredrickson. I asked pt if she needed a note to be faxed to dental office. Pt replied please yes fax a note. Pt gave me the phone # to [facility] and s/w [employer]. Stanton Kidney gave me a fax number [phone] attn [employer]. Both the pt and Stanton Kidney have thanked our office for the help today.

## 2020-05-21 NOTE — Telephone Encounter (Signed)
Will send to pre op team for further evaluation.

## 2020-05-28 ENCOUNTER — Ambulatory Visit: Payer: Medicare PPO | Admitting: Podiatry

## 2020-05-28 ENCOUNTER — Other Ambulatory Visit: Payer: Self-pay

## 2020-05-28 ENCOUNTER — Encounter: Payer: Self-pay | Admitting: Podiatry

## 2020-05-28 ENCOUNTER — Telehealth: Payer: Self-pay | Admitting: *Deleted

## 2020-05-28 DIAGNOSIS — L97521 Non-pressure chronic ulcer of other part of left foot limited to breakdown of skin: Secondary | ICD-10-CM

## 2020-05-28 MED ORDER — MUPIROCIN 2 % EX OINT: 1.0000 "application " | TOPICAL_OINTMENT | Freq: Two times a day (BID) | CUTANEOUS | 2 refills | Status: DC

## 2020-05-28 MED ORDER — AMOXICILLIN-POT CLAVULANATE 875-125 MG PO TABS: 1.0000 | ORAL_TABLET | Freq: Two times a day (BID) | ORAL | 0 refills | Status: DC

## 2020-05-28 NOTE — Telephone Encounter (Addendum)
Patient is wanting to know how to care for her ulcer, should she take a shower, bathe foot separately and keep it dry.She stated that she did not receive instructions.  Called and read the epsom salts soaking instructions to patient and told her to make sure that she keeps her F/u appt., with Dr Milinda Pointer, verbalized understanding and said thank you.

## 2020-05-28 NOTE — Patient Instructions (Signed)

## 2020-05-28 NOTE — Progress Notes (Signed)
She presents today states that after she got out of the pool the other day she noticed the toe was a little bit red and it started really looking bad yesterday. She states it was red and swollen and had some drainage. She is referring to the hallux left chronic ulceration has been present for many years.  Objective: Vital signs are stable alert and oriented x3. Pulses are palpable. There is erythema some cellulitis extending to the level of the metatarsophalangeal joint with the distal ulceration measuring less than a centimeter in diameter. This does not probe deep to bone granulation tissue and epithelialization is occurring.  Assessment: Chronic ulceration hallux left. Cellulitis hallux left.  Plan: Debrided all reactive hyperkeratotic tissue today start her on Augmentin 875 mg 1 p.o.twice daily for the next 2 weeks and also started her on Bactroban ointment to be applied twice daily a small amount with a dry sterile dressing. Follow-up with her in 2 weeks

## 2020-05-29 DIAGNOSIS — F429 Obsessive-compulsive disorder, unspecified: Secondary | ICD-10-CM | POA: Diagnosis not present

## 2020-06-04 ENCOUNTER — Ambulatory Visit: Payer: Medicare PPO | Admitting: Podiatry

## 2020-06-06 ENCOUNTER — Other Ambulatory Visit: Payer: Self-pay

## 2020-06-06 ENCOUNTER — Ambulatory Visit: Payer: Medicare PPO | Admitting: Podiatry

## 2020-06-06 ENCOUNTER — Encounter: Payer: Self-pay | Admitting: Podiatry

## 2020-06-06 DIAGNOSIS — L97521 Non-pressure chronic ulcer of other part of left foot limited to breakdown of skin: Secondary | ICD-10-CM | POA: Diagnosis not present

## 2020-06-06 NOTE — Progress Notes (Signed)
She presents today for follow-up of her ulceration hallux left.  She is just finishing up her antibiotics.  States that she is continuing to soak and apply her dressings.  Objective: Vital signs stable alert oriented x3 there is no erythema edema cellulitis drainage or odor.  Reactive hyperkeratotic tissue overlying the ulceration once debrided does demonstrate a superficial ulceration which I trimmed today to bleeding.  This does not probe to bone and its about 3 mm in diameter it is believable.  She does have a loose hallux nail which I debrided back for her today.  I will follow-up with her in about 10 days.  We did discuss soaking as well as dressing.  Assessment chronic ulceration hallux left  Plan: Debridement of the wound in the nail today we will follow-up with her in about 10 days.

## 2020-06-11 ENCOUNTER — Ambulatory Visit: Payer: Medicare PPO | Admitting: Podiatry

## 2020-06-11 DIAGNOSIS — F329 Major depressive disorder, single episode, unspecified: Secondary | ICD-10-CM | POA: Diagnosis not present

## 2020-06-17 ENCOUNTER — Other Ambulatory Visit: Payer: Self-pay | Admitting: Obstetrics and Gynecology

## 2020-06-17 DIAGNOSIS — Z1231 Encounter for screening mammogram for malignant neoplasm of breast: Secondary | ICD-10-CM

## 2020-06-18 ENCOUNTER — Ambulatory Visit: Payer: Medicare PPO | Admitting: Podiatry

## 2020-06-26 DIAGNOSIS — F429 Obsessive-compulsive disorder, unspecified: Secondary | ICD-10-CM | POA: Diagnosis not present

## 2020-07-01 ENCOUNTER — Telehealth: Payer: Self-pay | Admitting: Podiatry

## 2020-07-01 NOTE — Telephone Encounter (Signed)
Pt left message asking about our afo and that if she could look at our afo's tomorrow when she has an appt with Dr Milinda Pointer.  I returned call and left message that most of our AFO's are custom made but we did have some samples in stock and that I have put a note into the appt to discuss with Dr Milinda Pointer.

## 2020-07-02 ENCOUNTER — Encounter: Payer: Self-pay | Admitting: Podiatry

## 2020-07-02 ENCOUNTER — Ambulatory Visit: Payer: Medicare PPO | Admitting: Podiatry

## 2020-07-02 ENCOUNTER — Other Ambulatory Visit: Payer: Self-pay

## 2020-07-02 DIAGNOSIS — L97521 Non-pressure chronic ulcer of other part of left foot limited to breakdown of skin: Secondary | ICD-10-CM | POA: Diagnosis not present

## 2020-07-03 NOTE — Progress Notes (Signed)
She presents today for follow-up of her ulceration to the hallux left.  She denies fever chills nausea vomiting muscle aches or pains states that she thought it was well and that she went swimming at the pool she says did not notice a little pus coming out of it.  Objective: Vital signs are stable alert and oriented x3 there is no erythema cellulitis or odor there is some drainage from the reactive hyperkeratotic lesion with a small punctated wound which I debrided sharply today and took a sample of the purulence to be sent for culture and sensitivity.  This does not probe deep.  She also has long thick yellow dystrophic clinically mycotic nails  Assessment: Pain in limb secondary to onychomycosis and chronic wound hallux left  Plan: She will continue the use of her Bactroban ointment and/or cream on cover daily and will follow-up with me in a couple of weeks.  Should her lab report come back abnormal we will notify her immediately.  Otherwise I will see her in 2 weeks I will smooth her nails the next time She comes in we did not have a grinder in the room.

## 2020-07-05 ENCOUNTER — Telehealth: Payer: Self-pay | Admitting: *Deleted

## 2020-07-05 LAB — WOUND CULTURE
MICRO NUMBER:: 11405488
SPECIMEN QUALITY:: ADEQUATE

## 2020-07-05 MED ORDER — LEVOFLOXACIN 750 MG PO TABS: 750.0000 mg | ORAL_TABLET | Freq: Every day | ORAL | 0 refills | Status: DC

## 2020-07-05 NOTE — Telephone Encounter (Signed)
Per Dr. Maxie Barb review of patient's wound culture-   send levofloxacin 750mg  x10 days take once daily  Called patient - L/M to inform her and I sent that Rx into her pharmacy

## 2020-07-07 NOTE — Progress Notes (Signed)
Levaquin Rx sent

## 2020-07-10 DIAGNOSIS — F429 Obsessive-compulsive disorder, unspecified: Secondary | ICD-10-CM | POA: Diagnosis not present

## 2020-07-17 ENCOUNTER — Encounter: Payer: Self-pay | Admitting: Podiatry

## 2020-07-18 ENCOUNTER — Other Ambulatory Visit: Payer: Self-pay | Admitting: Cardiovascular Disease

## 2020-07-18 ENCOUNTER — Encounter: Payer: Self-pay | Admitting: Podiatry

## 2020-07-18 ENCOUNTER — Other Ambulatory Visit: Payer: Self-pay

## 2020-07-18 ENCOUNTER — Ambulatory Visit: Payer: Medicare PPO | Admitting: Podiatry

## 2020-07-18 DIAGNOSIS — L97521 Non-pressure chronic ulcer of other part of left foot limited to breakdown of skin: Secondary | ICD-10-CM

## 2020-07-19 NOTE — Progress Notes (Signed)
She presents today for follow-up of her hallux ulceration.  Currently she states that seems to be doing better has not been draining much.  Objective: Vital signs are stable she alert oriented x3 hallux left demonstrates a superficial ulceration that currently is not open upon initial evaluation however after debridement measures about 0.5 mm in diameter does not probe deep to bone.  Is starting to develop a little more valgus deformity in the interphalangeal joint area most likely secondary to her dropfoot.  Assessment: Chronic ulceration distal aspect hallux left.  Plan: Discussed etiology pathology and surgical therapies debrided the wound today she will continue to do so we are requesting another upright brace for her dropfoot since her other 1 is broken.  Requesting graphite

## 2020-07-24 DIAGNOSIS — F429 Obsessive-compulsive disorder, unspecified: Secondary | ICD-10-CM | POA: Diagnosis not present

## 2020-07-29 DIAGNOSIS — I739 Peripheral vascular disease, unspecified: Secondary | ICD-10-CM | POA: Diagnosis not present

## 2020-07-29 DIAGNOSIS — M21372 Foot drop, left foot: Secondary | ICD-10-CM | POA: Diagnosis not present

## 2020-07-29 DIAGNOSIS — I10 Essential (primary) hypertension: Secondary | ICD-10-CM | POA: Diagnosis not present

## 2020-07-29 DIAGNOSIS — L97521 Non-pressure chronic ulcer of other part of left foot limited to breakdown of skin: Secondary | ICD-10-CM | POA: Diagnosis not present

## 2020-08-01 ENCOUNTER — Encounter: Payer: Self-pay | Admitting: Podiatry

## 2020-08-01 ENCOUNTER — Ambulatory Visit: Payer: Medicare PPO | Admitting: Podiatry

## 2020-08-01 ENCOUNTER — Other Ambulatory Visit: Payer: Self-pay

## 2020-08-01 DIAGNOSIS — L97521 Non-pressure chronic ulcer of other part of left foot limited to breakdown of skin: Secondary | ICD-10-CM | POA: Diagnosis not present

## 2020-08-01 DIAGNOSIS — Z961 Presence of intraocular lens: Secondary | ICD-10-CM | POA: Diagnosis not present

## 2020-08-01 DIAGNOSIS — H31093 Other chorioretinal scars, bilateral: Secondary | ICD-10-CM | POA: Diagnosis not present

## 2020-08-01 DIAGNOSIS — H35343 Macular cyst, hole, or pseudohole, bilateral: Secondary | ICD-10-CM | POA: Diagnosis not present

## 2020-08-01 DIAGNOSIS — H35373 Puckering of macula, bilateral: Secondary | ICD-10-CM | POA: Diagnosis not present

## 2020-08-01 DIAGNOSIS — H401123 Primary open-angle glaucoma, left eye, severe stage: Secondary | ICD-10-CM | POA: Diagnosis not present

## 2020-08-01 NOTE — Progress Notes (Signed)
She presents today for follow-up of her ulceration left hallux.  States is doing better does not appear to be infected and is not hurting.  Objective: Vital signs are stable she is alert oriented x3 there is no erythema edema cellulitis drainage or odor reactive hyperkeratotic lesion overlying the plantar medial aspect of the hallux left was debrided does demonstrate superficial ulceration much smaller than previously noted.  Measures approximately 3 mm in total diameter and minimal depth.  No purulence no malodor no signs of infection.  Assessment: Dropfoot left with hallux valgus deformity resulting in chronic pressures of the forefoot and skin breakdown.  Plan: Debrided the area today redressed and dressed a compressive dressing follow-up with her in 1 month she will consist continue dressing changes daily.

## 2020-08-02 ENCOUNTER — Encounter: Payer: Self-pay | Admitting: Podiatry

## 2020-08-07 DIAGNOSIS — F429 Obsessive-compulsive disorder, unspecified: Secondary | ICD-10-CM | POA: Diagnosis not present

## 2020-08-21 DIAGNOSIS — F429 Obsessive-compulsive disorder, unspecified: Secondary | ICD-10-CM | POA: Diagnosis not present

## 2020-08-28 DIAGNOSIS — D225 Melanocytic nevi of trunk: Secondary | ICD-10-CM | POA: Diagnosis not present

## 2020-08-28 DIAGNOSIS — Z85828 Personal history of other malignant neoplasm of skin: Secondary | ICD-10-CM | POA: Diagnosis not present

## 2020-08-28 DIAGNOSIS — L821 Other seborrheic keratosis: Secondary | ICD-10-CM | POA: Diagnosis not present

## 2020-08-28 DIAGNOSIS — L570 Actinic keratosis: Secondary | ICD-10-CM | POA: Diagnosis not present

## 2020-08-29 ENCOUNTER — Other Ambulatory Visit: Payer: Self-pay

## 2020-08-29 ENCOUNTER — Ambulatory Visit: Payer: Medicare PPO | Admitting: Podiatry

## 2020-08-29 DIAGNOSIS — L97521 Non-pressure chronic ulcer of other part of left foot limited to breakdown of skin: Secondary | ICD-10-CM | POA: Diagnosis not present

## 2020-08-29 NOTE — Progress Notes (Signed)
She presents today for follow-up of her ulceration plantar medial aspect of the hallux left.  She denies fever chills nausea vomiting muscle aches and pain states that she has been staying in the pool every day exercising.  Objective: Vital signs are stable alert oriented x3 there is no erythema edema cellulitis drainage or odor on the wound appears to be healed.  However once debriding it I was able to open the wound did not probe deep is much more superficial than it has been in the past no purulence no malodor no signs of infection.  Assessment: Well-healing ulceration hallux left.  Plan: This point redebridement the area cleaned out thoroughly placed in a dry sterile compressive dressing with a small amount of antibiotic ointment and I will follow-up with her in the next few weeks.  She states that her letter for [employer] work she was able to get her brace.

## 2020-09-03 DIAGNOSIS — M6281 Muscle weakness (generalized): Secondary | ICD-10-CM | POA: Diagnosis not present

## 2020-09-03 DIAGNOSIS — R2681 Unsteadiness on feet: Secondary | ICD-10-CM | POA: Diagnosis not present

## 2020-09-03 DIAGNOSIS — M21372 Foot drop, left foot: Secondary | ICD-10-CM | POA: Diagnosis not present

## 2020-09-04 DIAGNOSIS — F429 Obsessive-compulsive disorder, unspecified: Secondary | ICD-10-CM | POA: Diagnosis not present

## 2020-09-12 DIAGNOSIS — E785 Hyperlipidemia, unspecified: Secondary | ICD-10-CM | POA: Diagnosis not present

## 2020-09-12 DIAGNOSIS — Z0000 Encounter for general adult medical examination without abnormal findings: Secondary | ICD-10-CM | POA: Diagnosis not present

## 2020-09-12 DIAGNOSIS — E559 Vitamin D deficiency, unspecified: Secondary | ICD-10-CM | POA: Diagnosis not present

## 2020-09-12 DIAGNOSIS — I10 Essential (primary) hypertension: Secondary | ICD-10-CM | POA: Diagnosis not present

## 2020-09-12 DIAGNOSIS — E876 Hypokalemia: Secondary | ICD-10-CM | POA: Diagnosis not present

## 2020-09-12 DIAGNOSIS — E11621 Type 2 diabetes mellitus with foot ulcer: Secondary | ICD-10-CM | POA: Diagnosis not present

## 2020-09-18 DIAGNOSIS — M21372 Foot drop, left foot: Secondary | ICD-10-CM | POA: Diagnosis not present

## 2020-09-18 DIAGNOSIS — F429 Obsessive-compulsive disorder, unspecified: Secondary | ICD-10-CM | POA: Diagnosis not present

## 2020-09-20 DIAGNOSIS — M6281 Muscle weakness (generalized): Secondary | ICD-10-CM | POA: Diagnosis not present

## 2020-09-20 DIAGNOSIS — M21372 Foot drop, left foot: Secondary | ICD-10-CM | POA: Diagnosis not present

## 2020-09-20 DIAGNOSIS — R2681 Unsteadiness on feet: Secondary | ICD-10-CM | POA: Diagnosis not present

## 2020-09-24 DIAGNOSIS — Z0000 Encounter for general adult medical examination without abnormal findings: Secondary | ICD-10-CM | POA: Diagnosis not present

## 2020-09-24 DIAGNOSIS — F5104 Psychophysiologic insomnia: Secondary | ICD-10-CM | POA: Diagnosis not present

## 2020-09-24 DIAGNOSIS — M21372 Foot drop, left foot: Secondary | ICD-10-CM | POA: Diagnosis not present

## 2020-09-24 DIAGNOSIS — I110 Hypertensive heart disease with heart failure: Secondary | ICD-10-CM | POA: Diagnosis not present

## 2020-09-24 DIAGNOSIS — Z23 Encounter for immunization: Secondary | ICD-10-CM | POA: Diagnosis not present

## 2020-09-24 DIAGNOSIS — M542 Cervicalgia: Secondary | ICD-10-CM | POA: Diagnosis not present

## 2020-09-24 DIAGNOSIS — I2510 Atherosclerotic heart disease of native coronary artery without angina pectoris: Secondary | ICD-10-CM | POA: Diagnosis not present

## 2020-09-24 DIAGNOSIS — K219 Gastro-esophageal reflux disease without esophagitis: Secondary | ICD-10-CM | POA: Diagnosis not present

## 2020-09-24 DIAGNOSIS — E785 Hyperlipidemia, unspecified: Secondary | ICD-10-CM | POA: Diagnosis not present

## 2020-09-30 DIAGNOSIS — Z1211 Encounter for screening for malignant neoplasm of colon: Secondary | ICD-10-CM | POA: Diagnosis not present

## 2020-10-01 ENCOUNTER — Other Ambulatory Visit: Payer: Self-pay

## 2020-10-01 ENCOUNTER — Ambulatory Visit (INDEPENDENT_AMBULATORY_CARE_PROVIDER_SITE_OTHER): Payer: Medicare PPO | Admitting: Podiatry

## 2020-10-01 ENCOUNTER — Encounter: Payer: Self-pay | Admitting: Podiatry

## 2020-10-01 DIAGNOSIS — L97521 Non-pressure chronic ulcer of other part of left foot limited to breakdown of skin: Secondary | ICD-10-CM | POA: Diagnosis not present

## 2020-10-01 DIAGNOSIS — M79676 Pain in unspecified toe(s): Secondary | ICD-10-CM | POA: Diagnosis not present

## 2020-10-01 DIAGNOSIS — B351 Tinea unguium: Secondary | ICD-10-CM | POA: Diagnosis not present

## 2020-10-01 NOTE — Progress Notes (Signed)
She presents today for follow-up of her ulceration hallux left.  She states I think is doing better.  The braces really seem to fix everything.  Nails elongated please cut them.  Objective: Vital signs are stable alert oriented x3.  Evaluation of the ulceration I debrided reactive hyperkeratotic tissue today the ulcer is no longer open appears to be completely closed.  Toenails are long thick yellow dystrophic-like mycotic.  Assessment: Pain limb secondary onychomycosis well-healed ulceration hallux interphalangeal joint plantar medial aspect left great toe.  Plan: I debrided all reactive hyperkeratotic tissue debrided toenails 1 through 5 bilateral.  Follow-up with her as needed.

## 2020-10-02 DIAGNOSIS — F429 Obsessive-compulsive disorder, unspecified: Secondary | ICD-10-CM | POA: Diagnosis not present

## 2020-10-04 ENCOUNTER — Other Ambulatory Visit: Payer: Self-pay

## 2020-10-04 ENCOUNTER — Ambulatory Visit
Admission: RE | Admit: 2020-10-04 | Discharge: 2020-10-04 | Disposition: A | Discharge status: Home / Self Care | Payer: Medicare PPO | Source: Ambulatory Visit | Attending: Obstetrics and Gynecology | Admitting: Obstetrics and Gynecology

## 2020-10-04 DIAGNOSIS — Z1231 Encounter for screening mammogram for malignant neoplasm of breast: Secondary | ICD-10-CM

## 2020-10-04 IMAGING — MG MM DIGITAL SCREENING BILAT W/ TOMO AND CAD
6 of 10 series · 6 of 30 positions shown · non-contrast
Comparison: Previous exam(s).

CLINICAL DATA: Screening.

EXAM:
DIGITAL SCREENING BILATERAL MAMMOGRAM WITH TOMOSYNTHESIS AND CAD
TECHNIQUE: Bilateral screening digital craniocaudal and mediolateral oblique
mammograms were obtained. Bilateral screening digital breast
tomosynthesis was performed. The images were evaluated with
computer-aided detection.

[R CC synth-2D]
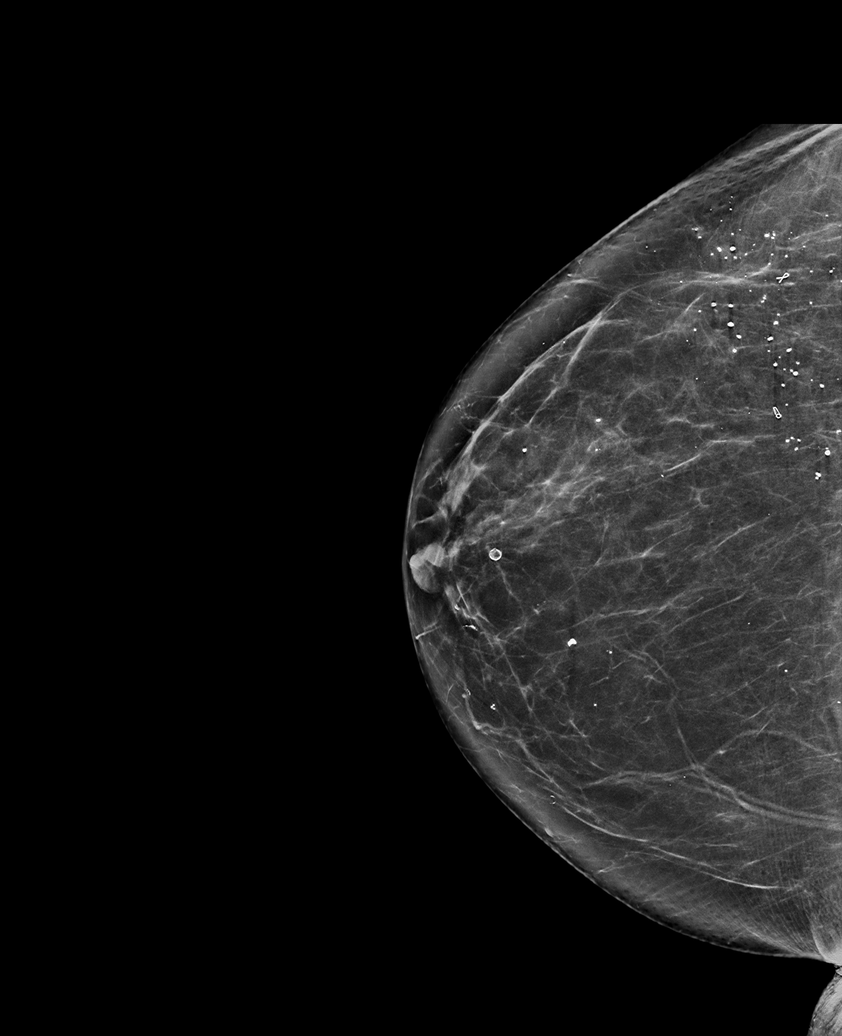

[L MLO synth-2D]
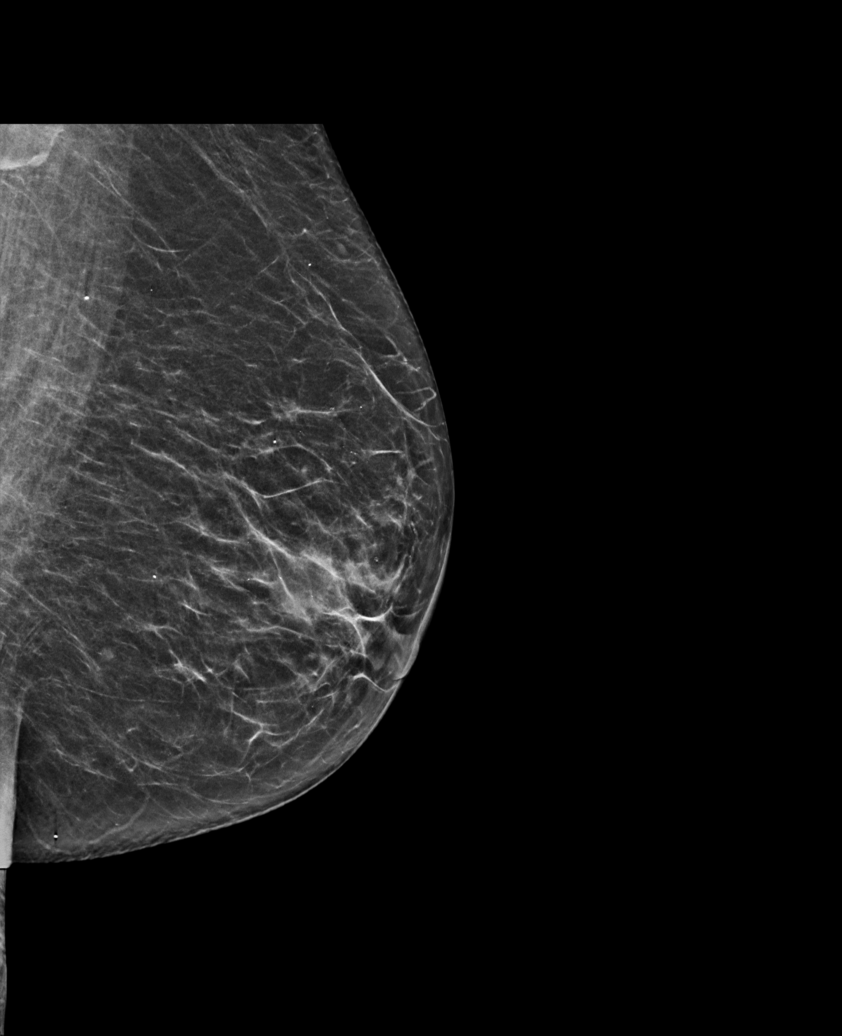

[R MLO synth-2D (1 of 2)]
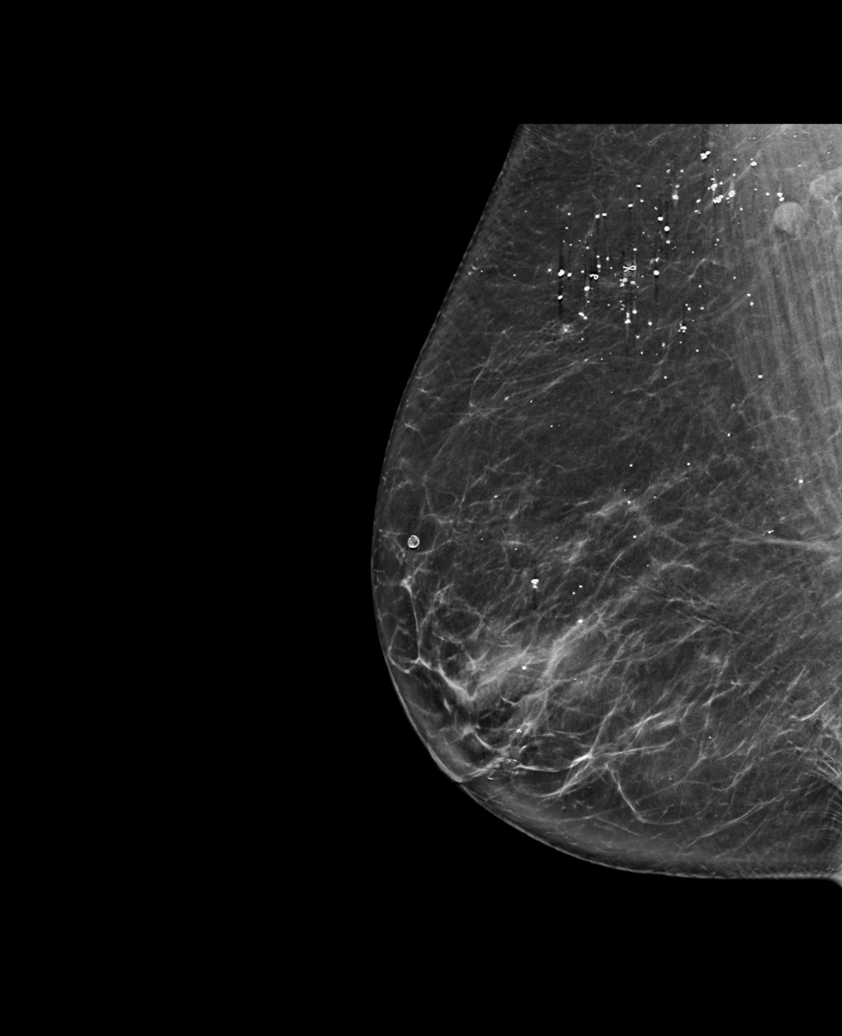

[R MLO synth-2D (2 of 2)]
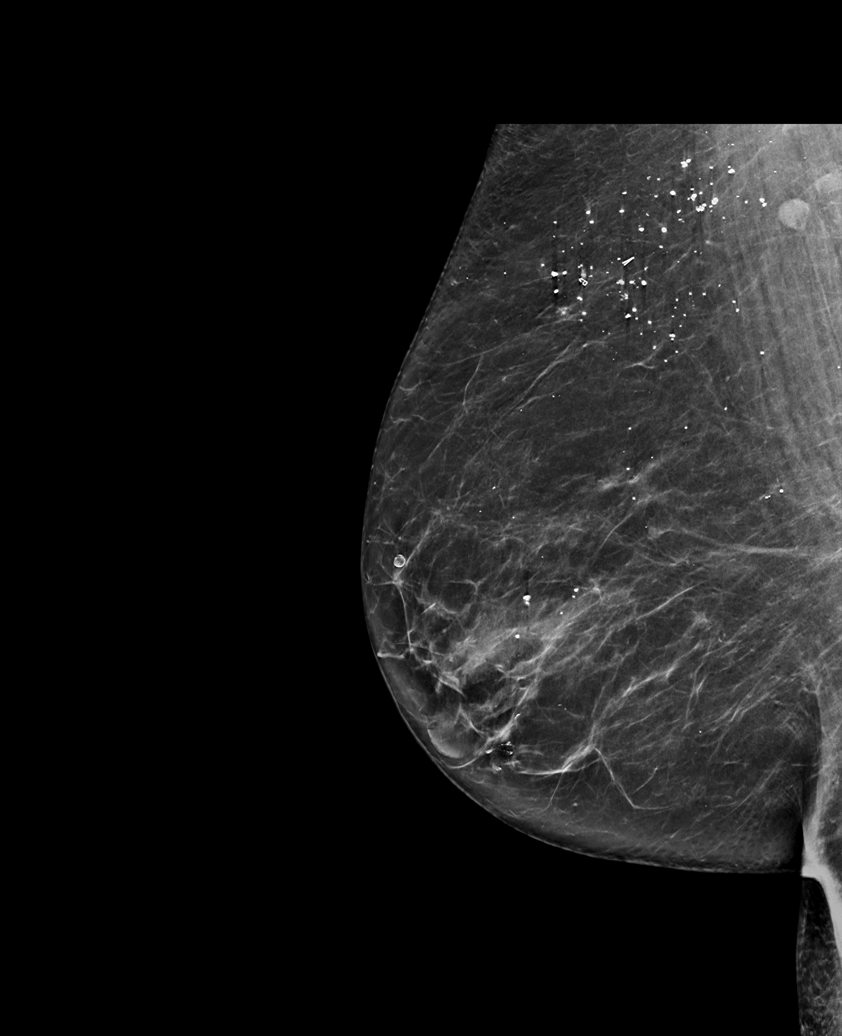

[L CC synth-2D]
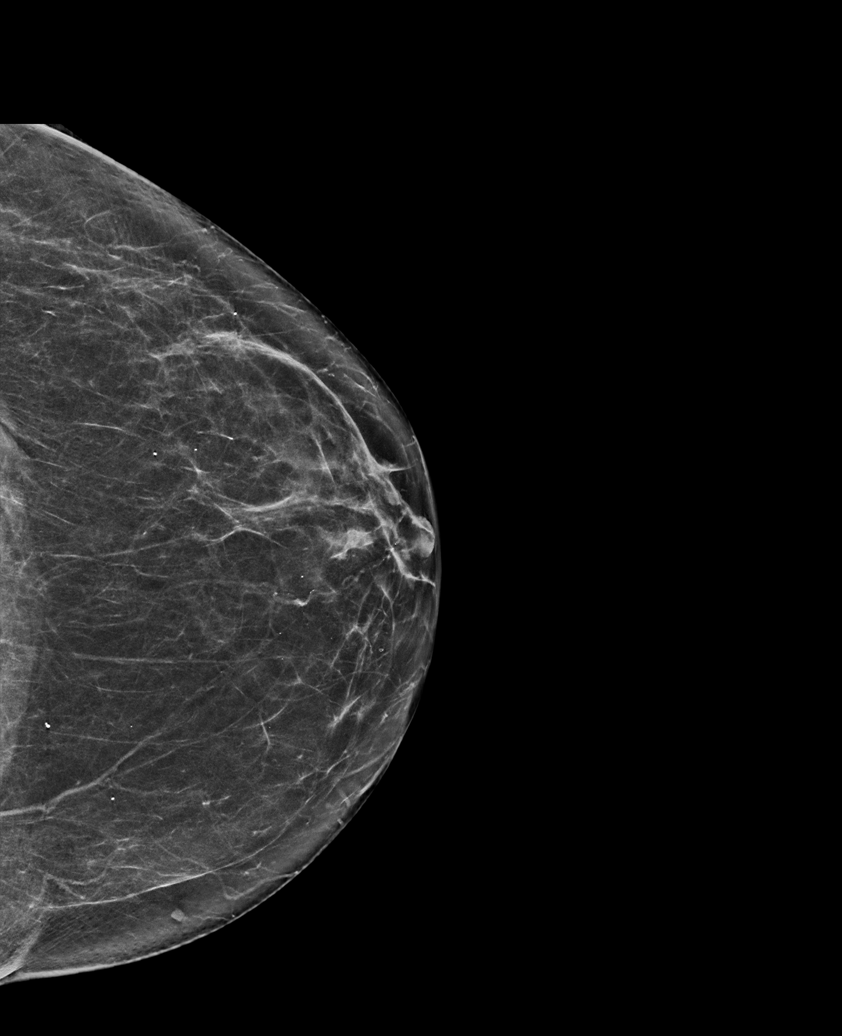

[R MLO tomo · tomo slice 39/78.0]
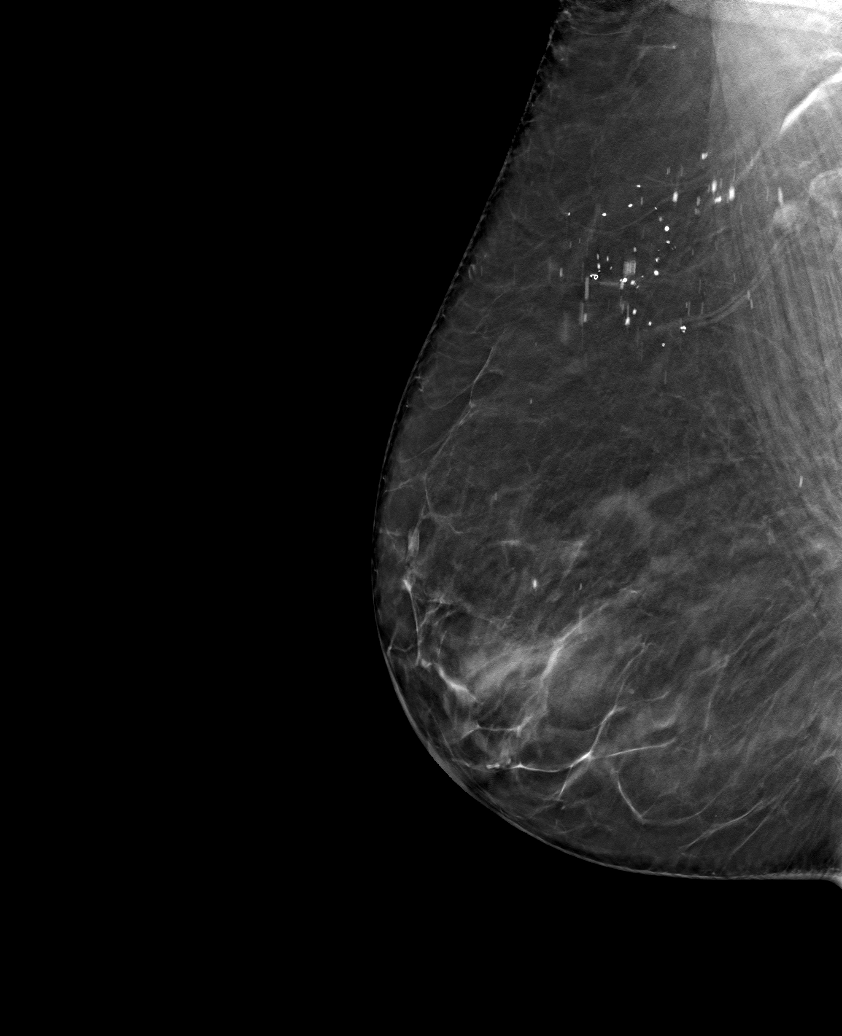

[6 of 30 positions shown; findings below may reference images not displayed]

ACR Breast Density Category b: There are scattered areas of
fibroglandular density.
FINDINGS: There are no findings suspicious for malignancy. The images were
evaluated with computer-aided detection.
IMPRESSION: No mammographic evidence of malignancy. A result letter of this
screening mammogram will be mailed directly to the patient.

RECOMMENDATION:
Screening mammogram in one year. (Code:[OD])

BI-RADS CATEGORY  1: Negative.

## 2020-10-06 LAB — COLOGUARD: COLOGUARD: NEGATIVE

## 2020-10-10 DIAGNOSIS — R2681 Unsteadiness on feet: Secondary | ICD-10-CM | POA: Diagnosis not present

## 2020-10-10 DIAGNOSIS — M21372 Foot drop, left foot: Secondary | ICD-10-CM | POA: Diagnosis not present

## 2020-10-10 DIAGNOSIS — M545 Low back pain, unspecified: Secondary | ICD-10-CM | POA: Diagnosis not present

## 2020-10-10 DIAGNOSIS — M6281 Muscle weakness (generalized): Secondary | ICD-10-CM | POA: Diagnosis not present

## 2020-10-16 DIAGNOSIS — F429 Obsessive-compulsive disorder, unspecified: Secondary | ICD-10-CM | POA: Diagnosis not present

## 2020-10-29 ENCOUNTER — Ambulatory Visit: Payer: Medicare PPO | Admitting: Physician Assistant

## 2020-10-29 ENCOUNTER — Encounter: Payer: Self-pay | Admitting: Physician Assistant

## 2020-10-29 NOTE — Progress Notes (Addendum)
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 female with:   HFimpEF (heart failure with improved ejection fraction)   Non-ischemic cardiomyopathy (low EF out of proportion to CAD)  EF 10-20 >> improved to 37  Echocardiogram 6/21: EF 45-50  Coronary artery disease   Non-obs CAD by cath in 2006 (LAD 30-50, D1 60-70) >> Med Rx  Myoview 12/17: low risk  Hyperlipidemia   GERD  Hypertension   DJD  Obesity   L peroneal palsy; wears AFO  Anxiety   Prior CV studies: Echocardiogram 12/13/19 EF 45-50, GR 1 DD, normal RVSF, trivial MR, trivial AI, mild AV sclerosis without stenosis  ABIs 11/23/17 Normal   GATED SPECT MYO PERF W/LEXISCAN STRESS 2D 06/03/2016 Narrative  Nuclear stress EF: 48%.  Findings consistent with prior myocardial infarction.  This is a low risk study.  The left ventricular ejection fraction is mildly decreased (45-54%).  There was no ST segment deviation noted during stress. Possible small apical infarct no ischemia EF estimated at 48 % with apical hypokinesis   Cardiac catheterization 07/30/04 LM normal LAD proximal 30-50, mid-distal moderate irregularities; D1 60-70 (1.5 mm vessel) LCx Minor irregularities RCA patent EF 40-45   History of Present Illness: Kristin Mack was last seen in 11/21 by Dr. Acie Fredrickson.  She returns for f/u.  She has a Designer, jewellery in education.  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  She is here alone today.  She does pool exercises 3 times a week.  She has not had chest discomfort, significant shortness of breath, leg edema, syncope.         Past Medical History:  Diagnosis Date  . Carpal tunnel syndrome    Left Hand  . Chest pain   . CHF (congestive heart failure) (HCC)    Dilated cardiomyopathy. Her original ejection fraction was 10-20%. Her ejection fraction has improved to 57% by 2009  . Coronary artery disease   . Depression   . Dyslipidemia   . Foot drop, left   . GERD (gastroesophageal reflux disease)   . Headache(784.0)   . Hemorrhoids   . History of anxiety   . Hypertension   . Lower back pain   . Myocardial infarction ([place])   . Obesity   . Osteoarthritis    Severe with collapse of the left hip  . Single functional kidney    single right kidney,  left kidney is atrophic    Current Medications: Current Meds  Medication Sig  . acetaminophen (TYLENOL) 500 MG tablet 1 tab po bid PRN  . melatonin ([employer] MELATONIN) 5 MG TABS Take 1 tablet by mouth at bedtime.  . Multiple Vitamins-Minerals (CENTRUM SILVER ULTRA WOMENS) TABS Take 1 tablet by mouth daily.     Allergies:   Celecoxib and Nickel   Social History   Tobacco Use  . Smoking status: Never Smoker  . Smokeless tobacco: Never Used  Vaping Use  . Vaping Use: Never used  Substance Use Topics  . Alcohol use: No    Alcohol/week: 0.0 standard drinks  .  Drug use: No     Family Hx: The patient's family history includes Cancer in her sister; Stroke in her maternal grandfather and mother. There is no history of Breast cancer.  ROS   EKGs/Labs/Other Test Reviewed:    EKG:  EKG is   ordered today.  The ekg ordered today demonstrates NSR, HR 68, normal axis, nonspecific ST-T wave changes, septal Q waves, QTC 423, no change when compared to prior tracings  Recent Labs: 05/10/2020: ALT 15; BUN 14; Creatinine, Ser 0.87; Potassium 4.2; Sodium 138   Recent Lipid Panel Lab Results  Component Value Date/Time   CHOL 162 05/10/2020 04:08 PM   TRIG 187 (H) 05/10/2020 04:08 PM   HDL 55 05/10/2020 04:08 PM   CHOLHDL 2.9 05/10/2020 04:08 PM   CHOLHDL 2.7  05/27/2016 10:44 AM   LDLCALC 76 05/10/2020 04:08 PM      Risk Assessment/Calculations:      Physical Exam:    VS:  BP 128/82   Pulse 68   Ht 5\' 6"  (1.676 m)   Wt 208 lb (94.3 kg)   SpO2 97%   BMI 33.57 kg/m     Wt Readings from Last 3 Encounters:  10/30/20 208 lb (94.3 kg)  05/10/20 212 lb (96.2 kg)  11/02/19 231 lb 8 oz (105 kg)     Constitutional:      Appearance: Healthy appearance. Not in distress.  Neck:     Vascular: JVD normal.  Pulmonary:     Effort: Pulmonary effort is normal.     Breath sounds: No wheezing. No rales.  Cardiovascular:     Normal rate. Regular rhythm. Normal S1. Normal S2.     Murmurs: There is no murmur.  Edema:    Peripheral edema absent.  Abdominal:     Palpations: Abdomen is soft. There is no hepatomegaly.  Musculoskeletal:     Comments: + L AFO in place Skin:    General: Skin is warm and dry.  Neurological:     Mental Status: Alert and oriented to person, place and time.     Cranial Nerves: Cranial nerves are intact.        ASSESSMENT & PLAN:    1. HFmrEF (heart failure with mildly reduced EF) EF has been as low as 20% in the past.  Most recent echocardiogram in 6/21 with EF 45-50.  Nonischemic cardiomyopathy.  NYHA II.  Volume status stable.  Continue current dose of losartan, carvedilol.  She apparently had issues with hyperkalemia in the past.  Follow-up in 6 months.  2. Coronary artery disease involving native coronary artery of native heart without angina pectoris Nonobstructive disease by cardiac catheterization 2006.  Myoview 2017 was low risk.  She is not having anginal symptoms.  Continue aspirin, atorvastatin.  3. Hyperlipidemia LDL goal <70 Continue high intensity statin therapy with atorvastatin 40 mg daily.  LDL in November 2021 was 76.  If her LDL continues to remain above 70, consider increasing atorvastatin versus adding ezetimibe.  4. Essential hypertension The patient's blood pressure is controlled on her  current regimen.  Continue current therapy.        Dispo:  Return in about 6 months (around 05/02/2021) for Routine Follow Up, w/ Dr. Acie Fredrickson.   Medication Adjustments/Labs and Tests Ordered: Current medicines are reviewed at length with the patient today.  Concerns regarding medicines are outlined above.  Tests Ordered: No orders of the defined types were placed in this encounter.  Medication Changes: No orders of the defined  types were placed in this encounter.   Signed, Richardson Dopp, PA-C  10/30/2020 10:39 AM    [facility] [address], [place], [place]  [address] Phone: [phone]; Fax: [phone]

## 2020-10-30 ENCOUNTER — Encounter: Payer: Self-pay | Admitting: Physician Assistant

## 2020-10-30 ENCOUNTER — Other Ambulatory Visit: Payer: Self-pay

## 2020-10-30 ENCOUNTER — Ambulatory Visit: Payer: Medicare PPO | Admitting: Physician Assistant

## 2020-10-30 VITALS — BP 128/82 | HR 68 | Ht 66.0 in | Wt 208.0 lb

## 2020-10-30 DIAGNOSIS — I2510 Atherosclerotic heart disease of native coronary artery without angina pectoris: Secondary | ICD-10-CM

## 2020-10-30 DIAGNOSIS — I10 Essential (primary) hypertension: Secondary | ICD-10-CM

## 2020-10-30 DIAGNOSIS — I5020 Unspecified systolic (congestive) heart failure: Secondary | ICD-10-CM

## 2020-10-30 DIAGNOSIS — E785 Hyperlipidemia, unspecified: Secondary | ICD-10-CM

## 2020-10-30 NOTE — Patient Instructions (Signed)
Medication Instructions:  Your physician recommends that you continue on your current medications as directed. Please refer to the Current Medication list given to you today.  *If you need a refill on your cardiac medications before your next appointment, please call your pharmacy*   Lab Work: NONE If you have labs (blood work) drawn today and your tests are completely normal, you will receive your results only by: Marland Kitchen MyChart Message (if you have MyChart) OR . A paper copy in the mail If you have any lab test that is abnormal or we need to change your treatment, we will call you to review the results.   Testing/Procedures: NONE   Follow-Up: At [facility], you and your health needs are our priority.  As part of our continuing mission to provide you with exceptional heart care, we have created designated Provider Care Teams.  These Care Teams include your primary Cardiologist (physician) and Advanced Practice Providers (APPs -  Physician Assistants and Nurse Practitioners) who all work together to provide you with the care you need, when you need it.  We recommend signing up for the patient portal called "MyChart".  Sign up information is provided on this After Visit Summary.  MyChart is used to connect with patients for Virtual Visits (Telemedicine).  Patients are able to view lab/test results, encounter notes, upcoming appointments, etc.  Non-urgent messages can be sent to your provider as well.   To learn more about what you can do with MyChart, go to [email or web address].    Your next appointment:   6 month(s)  The format for your next appointment:   In Person  Provider:   You may see Mertie Moores, MD or one of the following Advanced Practice Providers on your designated Care Team:    Richardson Dopp, PA-C  [place], [place]

## 2020-11-01 NOTE — Addendum Note (Signed)
Addended by: Jacinta Shoe on: 11/01/2020 12:53 PM   Modules accepted: Orders

## 2020-11-13 DIAGNOSIS — F429 Obsessive-compulsive disorder, unspecified: Secondary | ICD-10-CM | POA: Diagnosis not present

## 2020-11-19 ENCOUNTER — Ambulatory Visit: Payer: Medicare PPO | Admitting: Cardiology

## 2020-11-27 DIAGNOSIS — F429 Obsessive-compulsive disorder, unspecified: Secondary | ICD-10-CM | POA: Diagnosis not present

## 2020-12-03 DIAGNOSIS — H401122 Primary open-angle glaucoma, left eye, moderate stage: Secondary | ICD-10-CM | POA: Diagnosis not present

## 2020-12-03 DIAGNOSIS — H401111 Primary open-angle glaucoma, right eye, mild stage: Secondary | ICD-10-CM | POA: Diagnosis not present

## 2020-12-11 DIAGNOSIS — F429 Obsessive-compulsive disorder, unspecified: Secondary | ICD-10-CM | POA: Diagnosis not present

## 2020-12-12 ENCOUNTER — Telehealth: Payer: Self-pay | Admitting: Pharmacist

## 2020-12-12 NOTE — Telephone Encounter (Signed)
Received a VM from patient asking [place] to update her med list Now on estradiol 0.01% and does not take calcium carbonate. Med list updated.

## 2020-12-25 ENCOUNTER — Other Ambulatory Visit: Payer: Self-pay

## 2020-12-25 DIAGNOSIS — F429 Obsessive-compulsive disorder, unspecified: Secondary | ICD-10-CM | POA: Diagnosis not present

## 2020-12-25 MED ORDER — CARVEDILOL 12.5 MG PO TABS: ORAL_TABLET | ORAL | 3 refills | Status: DC

## 2020-12-26 DIAGNOSIS — F329 Major depressive disorder, single episode, unspecified: Secondary | ICD-10-CM | POA: Diagnosis not present

## 2020-12-26 DIAGNOSIS — F429 Obsessive-compulsive disorder, unspecified: Secondary | ICD-10-CM | POA: Diagnosis not present

## 2021-01-08 DIAGNOSIS — F429 Obsessive-compulsive disorder, unspecified: Secondary | ICD-10-CM | POA: Diagnosis not present

## 2021-01-09 ENCOUNTER — Other Ambulatory Visit: Payer: Self-pay

## 2021-01-09 ENCOUNTER — Ambulatory Visit: Payer: Medicare PPO | Admitting: Podiatry

## 2021-01-09 ENCOUNTER — Encounter: Payer: Self-pay | Admitting: Podiatry

## 2021-01-09 DIAGNOSIS — M79676 Pain in unspecified toe(s): Secondary | ICD-10-CM | POA: Diagnosis not present

## 2021-01-09 DIAGNOSIS — L97521 Non-pressure chronic ulcer of other part of left foot limited to breakdown of skin: Secondary | ICD-10-CM

## 2021-01-09 DIAGNOSIS — B351 Tinea unguium: Secondary | ICD-10-CM

## 2021-01-09 DIAGNOSIS — D2371 Other benign neoplasm of skin of right lower limb, including hip: Secondary | ICD-10-CM

## 2021-01-09 DIAGNOSIS — D2372 Other benign neoplasm of skin of left lower limb, including hip: Secondary | ICD-10-CM

## 2021-01-11 NOTE — Progress Notes (Signed)
She presents today for follow-up of her painful elongated toenails bilaterally as well as her chronic ulceration plantar medial aspect of the left hallux.  She states that everything's been going well she continues to dress the toe daily wear her silicone pad daily she continues to wear her brace for her foot drop left.  Objective: Vital signs are stable alert oriented x3.  Pulses are palpable.  Ulceration has gone on to heal 100% currently there are no open lesions plantar aspect of her left foot.  No open lesions of the right foot.  Toenails are long thick yellow dystrophic-like mycotic no signs of infection.  Assessment: Well-healing ulcerative lesion left hallux.  Pain in limb secondary to onychomycosis.  Plan: Debridement of toenails and benign skin lesion.  Plan: Follow-up with me 3 months.  Provided her with more silicone pads.

## 2021-01-15 DIAGNOSIS — F429 Obsessive-compulsive disorder, unspecified: Secondary | ICD-10-CM | POA: Diagnosis not present

## 2021-01-20 DIAGNOSIS — L821 Other seborrheic keratosis: Secondary | ICD-10-CM | POA: Diagnosis not present

## 2021-01-20 DIAGNOSIS — L570 Actinic keratosis: Secondary | ICD-10-CM | POA: Diagnosis not present

## 2021-01-20 DIAGNOSIS — Z85828 Personal history of other malignant neoplasm of skin: Secondary | ICD-10-CM | POA: Diagnosis not present

## 2021-02-05 DIAGNOSIS — F429 Obsessive-compulsive disorder, unspecified: Secondary | ICD-10-CM | POA: Diagnosis not present

## 2021-02-11 DIAGNOSIS — N952 Postmenopausal atrophic vaginitis: Secondary | ICD-10-CM | POA: Diagnosis not present

## 2021-02-11 DIAGNOSIS — L900 Lichen sclerosus et atrophicus: Secondary | ICD-10-CM | POA: Diagnosis not present

## 2021-02-11 DIAGNOSIS — Z01411 Encounter for gynecological examination (general) (routine) with abnormal findings: Secondary | ICD-10-CM | POA: Diagnosis not present

## 2021-02-19 DIAGNOSIS — F429 Obsessive-compulsive disorder, unspecified: Secondary | ICD-10-CM | POA: Diagnosis not present

## 2021-03-05 DIAGNOSIS — F429 Obsessive-compulsive disorder, unspecified: Secondary | ICD-10-CM | POA: Diagnosis not present

## 2021-03-11 DIAGNOSIS — H401111 Primary open-angle glaucoma, right eye, mild stage: Secondary | ICD-10-CM | POA: Diagnosis not present

## 2021-03-11 DIAGNOSIS — H401122 Primary open-angle glaucoma, left eye, moderate stage: Secondary | ICD-10-CM | POA: Diagnosis not present

## 2021-03-19 DIAGNOSIS — F429 Obsessive-compulsive disorder, unspecified: Secondary | ICD-10-CM | POA: Diagnosis not present

## 2021-04-02 DIAGNOSIS — F429 Obsessive-compulsive disorder, unspecified: Secondary | ICD-10-CM | POA: Diagnosis not present

## 2021-04-10 ENCOUNTER — Other Ambulatory Visit: Payer: Self-pay

## 2021-04-10 ENCOUNTER — Ambulatory Visit: Payer: Medicare PPO | Admitting: Podiatry

## 2021-04-10 DIAGNOSIS — B351 Tinea unguium: Secondary | ICD-10-CM | POA: Diagnosis not present

## 2021-04-10 DIAGNOSIS — D2371 Other benign neoplasm of skin of right lower limb, including hip: Secondary | ICD-10-CM | POA: Diagnosis not present

## 2021-04-10 DIAGNOSIS — M79676 Pain in unspecified toe(s): Secondary | ICD-10-CM

## 2021-04-10 DIAGNOSIS — D2372 Other benign neoplasm of skin of left lower limb, including hip: Secondary | ICD-10-CM

## 2021-04-10 NOTE — Progress Notes (Signed)
She presents today chief complaint of painful elongated toenails and chronic ulceration to the hallux left.  Objective: Vital signs are stable she is alert and oriented x3.  Pulses are palpable.  Reactive hyperkeratotic tissue overlying a chronic ulceration distal medial aspect of the hallux left foot.  Was debrided today does not demonstrate bleeding no open wound.  Still has dropfoot to this left foot.  Toenails are long thick yellow dystrophic and clinically mycotic.  Assessment: Dropfoot left.  Chronic ulceration hallux left.  Painful elongated toenails bilateral.  Plan: Debridement of toenails 1 through 5 bilateral debridement of reactive hyperkeratotic tissue and placement of padding.  Follow-up with her in 3 months

## 2021-04-15 DIAGNOSIS — R0981 Nasal congestion: Secondary | ICD-10-CM | POA: Diagnosis not present

## 2021-04-15 DIAGNOSIS — I110 Hypertensive heart disease with heart failure: Secondary | ICD-10-CM | POA: Diagnosis not present

## 2021-04-15 DIAGNOSIS — Z1152 Encounter for screening for COVID-19: Secondary | ICD-10-CM | POA: Diagnosis not present

## 2021-04-15 DIAGNOSIS — R5383 Other fatigue: Secondary | ICD-10-CM | POA: Diagnosis not present

## 2021-04-15 DIAGNOSIS — R051 Acute cough: Secondary | ICD-10-CM | POA: Diagnosis not present

## 2021-04-18 ENCOUNTER — Ambulatory Visit
Admission: EM | Admit: 2021-04-18 | Discharge: 2021-04-18 | Disposition: A | Discharge status: Home / Self Care | Payer: Medicare PPO | Attending: Emergency Medicine | Admitting: Emergency Medicine

## 2021-04-18 DIAGNOSIS — Z23 Encounter for immunization: Secondary | ICD-10-CM | POA: Diagnosis not present

## 2021-04-18 DIAGNOSIS — S61239A Puncture wound without foreign body of unspecified finger without damage to nail, initial encounter: Secondary | ICD-10-CM

## 2021-04-18 MED ORDER — TETANUS-DIPHTH-ACELL PERTUSSIS 5-2.5-18.5 LF-MCG/0.5 IM SUSY
0.5000 mL | PREFILLED_SYRINGE | Freq: Once | INTRAMUSCULAR | Status: AC
  Administered 2021-04-18: 0.5 mL via INTRAMUSCULAR

## 2021-04-18 NOTE — ED Triage Notes (Signed)
Patient states today she accidentally obtained a puncture wound to her right index finger.

## 2021-04-18 NOTE — Discharge Instructions (Addendum)
You received a tetanus, diphtheria and pertussis booster vaccine today.  You are may be sore for a few days and you may also experience some intermittent fever, this is normal with this particular booster.  Please monitor your finger for signs and symptoms of swelling, redness, purulent drainage.  At this time I do not believe antibiotics are necessary.  If you begin to see any of these signs however please give [place] a call and let us know and we will send your pharmacy a prescription for antibiotics.

## 2021-04-18 NOTE — ED Provider Notes (Signed)
[facility]    CSN: [id number] Arrival date & time: 04/18/21  1313      History   Chief Complaint Chief Complaint  Patient presents with   Puncture Wound    HPI Kristin Ina, PhD is a 80 y.o. female.   Patient complains of a deep cut on her right index finger on a rusty nail sticking out of her outdoor garbage collection bin this morning, states there was significant amount of bleeding and realized that she has not had a tetanus shot in more than 10 years.  Patient is requesting booster today.  The history is provided by the patient.   Past Medical History:  Diagnosis Date   Carpal tunnel syndrome    Left Hand   Chest pain    CHF (congestive heart failure) (HCC)    Dilated cardiomyopathy. Her original ejection fraction was 10-20%. Her ejection fraction has improved to 57% by 2009   Coronary artery disease    Depression    Dyslipidemia    Foot drop, left    GERD (gastroesophageal reflux disease)    Headache(784.0)    Hemorrhoids    History of anxiety    Hypertension    Lower back pain    Myocardial infarction [facility])    Obesity    Osteoarthritis    Severe with collapse of the left hip   Single functional kidney    single right kidney,  left kidney is atrophic    Patient Active Problem List   Diagnosis Date Noted   Complete tear of right rotator cuff 06/18/2016   Localized, primary osteoarthritis of shoulder region, right 06/18/2016   Coronary artery disease involving native coronary artery of native heart without angina pectoris 05/27/2016   Height loss 01/09/2016   History of total left hip arthroplasty 01/09/2016   Left foot drop 01/09/2016   Postmenopausal status 01/09/2016   Abscess of axilla, right 08/31/2011   Cough 07/29/2011   Sinusitis 04/15/8526   Chronic systolic CHF (congestive heart failure) ([place]) 03/11/2011   Hyperlipidemia 03/11/2011   Carpal tunnel syndrome    GERD (gastroesophageal reflux disease)    Rash and other  nonspecific skin eruption 11/14/2009   Atrophic vaginitis 11/12/2009   Depressive disorder, not elsewhere classified 11/12/2009   Onychomycosis due to dermatophyte 11/12/2009   Arthropathy 11/07/2009   Congestive heart failure ([place]) 11/07/2009   Low back pain 11/07/2009    Past Surgical History:  Procedure Laterality Date   BREAST BIOPSY Right    x2   CARDIAC CATHETERIZATION     Ejection Fraction was around 45-50%   HYSTEROSCOPY  09/03/2011   Procedure: HYSTEROSCOPY;  Surgeon: Logan Bores, MD;  Location: [facility];  Service: Gynecology;  Laterality: N/A;  With Resection of polyp with truclear system   REPLACEMENT TOTAL HIP W/  RESURFACING IMPLANTS  2002   left   TUBAL LIGATION      OB History   No obstetric history on file.      Home Medications    Prior to Admission medications   Medication Sig Start Date End Date Taking? Authorizing Provider  acetaminophen (TYLENOL) 500 MG tablet 1 tab po bid PRN 09/20/09   [provider]  ACID CONTROLLER MAX ST 20 MG tablet TAKE 1 TABLET BY MOUTH BID 05/24/18   [provider]  amoxicillin (AMOXIL) 500 MG capsule Take 4 capsules by mouth as needed. 1 hr prior to dental work 07/31/19   [provider]  amoxicillin-clavulanate (AUGMENTIN) [phone]  MG tablet Take 1 tablet by mouth 2 (two) times daily. 05/28/20   Hyatt, Max T, DPM  aspirin 81 MG tablet Take 81 mg by mouth daily.     [provider]  atorvastatin (LIPITOR) 40 MG tablet Take 40 mg by mouth daily. 10/29/14   [provider]  calcium citrate-vitamin D (CITRACAL+D) 315-200 MG-UNIT tablet Take by mouth.    [provider]  carvedilol (COREG) 12.5 MG tablet TAKE 1 TABLET BY MOUTH TWICE DAILY WITH A MEAL 12/25/20   Nahser, Wonda Cheng, MD  clobetasol cream (TEMOVATE) 4.09 % Apply 1 application topically 2 (two) times daily.  02/15/16   [provider]  estradiol (ESTRACE) 0.1 MG/GM vaginal cream estradiol 0.01% (0.1 mg/gram) vaginal  cream apply locally 3 times per week 12/12/20   Nahser, Wonda Cheng, MD  furosemide (LASIX) 40 MG tablet TAKE 1 TABLET(40 MG) BY MOUTH DAILY 07/19/20   Nahser, Wonda Cheng, MD  latanoprost (XALATAN) 0.005 % ophthalmic solution Place 1 drop into both eyes at bedtime. 02/15/17   [provider]  levofloxacin (LEVAQUIN) 750 MG tablet Take 1 tablet (750 mg total) by mouth daily. 07/05/20   McDonald, Stephan Minister, DPM  LORazepam (ATIVAN) 0.5 MG tablet Take 0.5 mg by mouth as needed for anxiety.     [provider]  losartan (COZAAR) 50 MG tablet Take 50 mg by mouth daily. On Monday Wednesday and Friday    [provider]  melatonin ([employer] MELATONIN) 5 MG TABS Take 1 tablet by mouth at bedtime. 03/18/11   [provider]  metoCLOPramide (REGLAN) 5 MG tablet TAKE 1 TABLET BY MOUTH AT BEDTIME FOR REFLUX 05/24/18   [provider]  moxifloxacin (VIGAMOX) 0.5 % ophthalmic solution moxifloxacin 0.5 % eye drops    [provider]  Multiple Vitamin (MULTI-VITAMIN PO) Take 1 tablet by mouth daily.     [provider]  Multiple Vitamins-Minerals (CENTRUM SILVER ULTRA WOMENS) TABS Take 1 tablet by mouth daily. 03/24/12   [provider]  mupirocin ointment (BACTROBAN) 2 % Apply 1 application topically 2 (two) times daily. 05/28/20   Hyatt, Max T, DPM  NAPROXEN PO Take 625 mg by mouth as needed.    [provider]  nitroGLYCERIN (NITROSTAT) 0.4 MG SL tablet PLACE 1 TABLET UNDER THE TONGUE IF NEEDED EVERY 5 MINUTES FOR CHEST PAIN 05/10/20   Nahser, Wonda Cheng, MD  prednisoLONE acetate (PRED FORTE) 1 % ophthalmic suspension prednisolone acetate 1 % eye drops,suspension    [provider]  PRESCRIPTION MEDICATION Place 1 mL vaginally as directed. inser 1 ml vaginally 2-3 times per week as directed  Estradiol Micronized  0.02% cream (is compounded for patient)    [provider]  risperiDONE (RISPERDAL) 1 MG tablet Take 1 tablet by mouth as  needed. 07/19/19   [provider]  sodium fluoride (PREVIDENT) 1.1 % GEL dental gel PreviDent 5000 Booster Plus 1.1 % dental paste    [provider]  venlafaxine XR (EFFEXOR-XR) 75 MG 24 hr capsule Take 3 capsules by mouth daily.    [provider]  Vitamin D, Ergocalciferol, (DRISDOL) 50000 UNITS CAPS Take 50,000 Units by mouth every 30 (thirty) days.  03/24/12   [provider]    Family History Family History  Problem Relation Age of Onset   Stroke Mother    Cancer Sister    Stroke Maternal Grandfather    Breast cancer Neg Hx     Social History Social History  Tobacco Use   Smoking status: Never   Smokeless tobacco: Never  Vaping Use   Vaping Use: Never used  Substance Use Topics   Alcohol use: No    Alcohol/week: 0.0 standard drinks   Drug use: No     Allergies   Celecoxib and Nickel   Review of Systems Review of Systems Pertinent findings noted in history of present illness.    Physical Exam Triage Vital Signs ED Triage Vitals  Enc Vitals Group     BP 04/18/21 0827 (!) 147/82     Pulse Rate 04/18/21 0827 72     Resp 04/18/21 0827 18     Temp 04/18/21 0827 98.3 F (36.8 C)     Temp Source 04/18/21 0827 Oral     SpO2 04/18/21 0827 98 %     Weight --      Height --      Head Circumference --      Peak Flow --      Pain Score 04/18/21 0826 5     Pain Loc --      Pain Edu? --      Excl. in [place]? --    No data found.  Updated Vital Signs BP 125/79 (BP Location: Left Arm)   Pulse 70   Temp 98.4 F (36.9 C) (Oral)   Resp 20   SpO2 95%   Visual Acuity Right Eye Distance:   Left Eye Distance:   Bilateral Distance:    Right Eye Near:   Left Eye Near:    Bilateral Near:     Physical Exam Vitals and nursing note reviewed.  Constitutional:      General: She is not in acute distress.    Appearance: Normal appearance. She is not ill-appearing.  HENT:     Head: Normocephalic and atraumatic.  Eyes:      General: Lids are normal.        Right eye: No discharge.        Left eye: No discharge.     Extraocular Movements: Extraocular movements intact.     Conjunctiva/sclera: Conjunctivae normal.     Right eye: Right conjunctiva is not injected.     Left eye: Left conjunctiva is not injected.  Neck:     Trachea: Trachea and phonation normal.  Cardiovascular:     Rate and Rhythm: Normal rate and regular rhythm.     Pulses: Normal pulses.     Heart sounds: Normal heart sounds. No murmur heard.   No friction rub. No gallop.  Pulmonary:     Effort: Pulmonary effort is normal. No accessory muscle usage, prolonged expiration or respiratory distress.     Breath sounds: Normal breath sounds. No stridor, decreased air movement or transmitted upper airway sounds. No decreased breath sounds, wheezing, rhonchi or rales.  Chest:     Chest wall: No tenderness.  Musculoskeletal:        General: Normal range of motion.     Cervical back: Normal range of motion and neck supple. Normal range of motion.  Lymphadenopathy:     Cervical: No cervical adenopathy.  Skin:    General: Skin is warm and dry.     Findings: No erythema or rash.     Comments: 2 mm laceration, approximately 2 mm deep on tip of right index finger, wound is hemostatic, no foreign body appreciated.  Neurological:     General: No focal deficit present.     Mental Status: She is alert and  oriented to person, place, and time.  Psychiatric:        Mood and Affect: Mood normal.        Behavior: Behavior normal.     UC Treatments / Results  Labs (all labs ordered are listed, but only abnormal results are displayed) Labs Reviewed - No data to display  EKG   Radiology No results found.  Procedures Procedures (including critical care time)  Medications Ordered in UC Medications  Tdap (BOOSTRIX) injection 0.5 mL (has no administration in time range)    Initial Impression / Assessment and Plan / UC Course  I have reviewed the  triage vital signs and the nursing notes.  Pertinent labs & imaging results that were available during my care of the patient were reviewed by me and considered in my medical decision making (see chart for details).     Wound was dressed and bandaged, Boostrix vaccine provided as requested.  Patient verbalized understanding and agreement of plan as discussed.  All questions were addressed during visit.  Please see discharge instructions below for further details of plan.  Final Clinical Impressions(s) / UC Diagnoses   Final diagnoses:  Puncture wound of finger, initial encounter     Discharge Instructions      You received a tetanus, diphtheria and pertussis booster vaccine today.  You are may be sore for a few days and you may also experience some intermittent fever, this is normal with this particular booster.  Please monitor your finger for signs and symptoms of swelling, redness, purulent drainage.  At this time I do not believe antibiotics are necessary.  If you begin to see any of these signs however please give [place] a call and let us know and we will send your pharmacy a prescription for antibiotics.     ED Prescriptions   None    PDMP not reviewed this encounter.    Lynden Oxford Scales, PA-C 04/18/21 1447

## 2021-04-23 DIAGNOSIS — F429 Obsessive-compulsive disorder, unspecified: Secondary | ICD-10-CM | POA: Diagnosis not present

## 2021-05-05 ENCOUNTER — Encounter: Payer: Self-pay | Admitting: Cardiovascular Disease

## 2021-05-05 NOTE — Progress Notes (Signed)
Cardiology Office Note   Date:  05/06/2021   ID:  Kristin Ina, PhD, DOB 1940-09-16, MRN [id number]  PCP:  Leanna Battles, MD  Cardiologist:   Mertie Moores, MD   Chief Complaint  Patient presents with   Coronary Artery Disease   Congestive Heart Failure        1. CHF- LV EF has improved dramatically 2. Hyperlipidemia 3. Left hip replacement   80 year old female with history of congestive heart failure. Her original ejection fraction was 10-20%. Her ejection fraction has dramatically improved and is now 57% ( Echo 2009). She had an echocardiogram in February 2011 that revealed normal left ventricle systolic function.   She's had 2 heart catheterizations. Her last cardiac cath was in 2006 which revealed moderate irregularities.   She's not having any episodes of chest pain or shortness breath. She works out a regular basis at least 2-3 times a week.  She has gained 20 lbs over the past several months.  She has a foot ulcer in her left foot and has not been in the pool.  She's not had any angina-like chest pain. She did have some stress-related chest discomfort around Christmastime but that has since resolved.  October 18, 2012:  Kristin Mack has done well for the past 6 months.  She has not had any dyspnea.  She has had lots of left hip pain and has not been able to exercise as much.   She is swimming 4 times a week.  Nov. 24, 2014:  Kristin Mack is doing well.  She has been having some acid reflux ( dry cough).    She has been trying eating smaller portions, earing earlier in the evening, avoiding spicy foods.   May 27,2015:  Kristin Mack is doing well.  No  CP or dyspnea.    Stressed out over family issues.   Her sons were living with her for several months.  She has gone up on her Effexor.  She has had some left thigh problems.    Dec. 1, 2015:  Kristin Mack is seen today for follow up of her CHF.    She has done well.    She has not been swimming as much recently - has an ulcer  on the bottom of her left great toe.   Oct 30, 2014:  Kristin Ina, PhD is a 80 y.o. female who presents for  Follow up of her CHF. Is getting over a cold. Has gained some weight.  Is back exercising.  Does water exercises.  Likes to go line dancing .   No CP , breathing is ok.  Has lots of stress with her 29 yo son.   Nov. 8,  2016:  Doing well from a cardiac standpoint.  Lots of issues with GERD. Lots of stress ,    No Cp or dyspnea.   November 22, 2015:  Doing well.  Has gained 13 lbs since her last OV in Nov.  She is very frustrated with her 35 year old son who still lives in the house with her.   Breathing is good.   Swim 3 times  a week.    Dec. 6, 2017  Kristin Mack is seen today for a preoperative evaluation. She needs to have a right shoulder surgery and right carpal tunnel surgery.  She is doing well from a cardiac standpoint.   Has brief Chest pain / left shoulder pain .    Pains last for 2-3 minutes.   Not related  to exercise - may occur after swimming.    Sept, 4, 2018:  Kristin Mack is seen today for follow up of her CAD Breathing is good .  Has been eating a bit of extra salt.   Going to the dentist  Myoview study in Dec. 2017 shows a fixed apical defect thought to be breast attenuation.  Has not had any cp. Did not have the shoulder surgery   Nov 08, 2017: Today for follow-up of her coronary artery disease.  She is had some systolic congestive heart failure in the past but is overall doing great currently. No CP or dyspnea Has an ulcer on her left great toe.  Started as a callous which she clipped off with sissors .   Got infected.  She met up with an old boyfriend , walked quite a bit at the [place]  No CP   Is not avoiding salt as much as she should .    Wt = 230 today   May 03, 2018: Kristena seen today for follow-up visit. Weight today is 228 pounds. Has not been exercising in the pool  ( due to toe ulcer )  Its now healed.  Breathing is ok She  was on the incorrect dose of Lisinopril for several months Now is on the right dose.  Has some atypical CP with arm movement -  No angina   January 02, 2019:  Kristin Mack is doing well.   Has not been swimming Has missed the exercise,   Has been eating more take out food Admits to eating more salt than she should  Nov 02, 2019:  Kristin Mack is doing well.  n  Wt today is 231 lbs.  Continues to have issues with her right knee.  No CP or dyspnea.   Is not getting as much exercise as she should .   Overall is doing well .  No cp or dyspnea   May 10, 2020:  Kristin Mack is seen today for a follow-up visit of her congestive heart failure.  Her weight today is 212 pounds. Has lost 20 lbs since last visit  Lisinopril has been changed to Losartan  Is avoiding the [facility] pool since she had cataract surgery  Breathing is ok.   Echocardiogram from June, 2021 reveals mildly depressed left ventricular systolic function with an ejection fraction of 45 to 50%.  This EF is unchanged from her previous echo in 2014.  She has grade 1 diastolic dysfunction.  May 06, 2021: Kristin Mack is seen today for follow-up visit of her congestive heart failure.  Her weight today is 208 lbs  No CP or dyspnea. Has some fatigue  Swimming regularly, 1600 times in 20 years     Past Medical History:  Diagnosis Date   Carpal tunnel syndrome    Left Hand   Chest pain    CHF (congestive heart failure) (HCC)    Dilated cardiomyopathy. Her original ejection fraction was 10-20%. Her ejection fraction has improved to 57% by 2009   Coronary artery disease    Depression    Dyslipidemia    Foot drop, left    GERD (gastroesophageal reflux disease)    Headache(784.0)    Hemorrhoids    History of anxiety    Hypertension    Lower back pain    Myocardial infarction ([place])    Obesity    Osteoarthritis    Severe with collapse of the left hip   Single functional kidney    single right kidney,  left kidney is atrophic    Past  Surgical History:  Procedure Laterality Date   BREAST BIOPSY Right    x2   CARDIAC CATHETERIZATION     Ejection Fraction was around 45-50%   HYSTEROSCOPY  09/03/2011   Procedure: HYSTEROSCOPY;  Surgeon: Logan Bores, MD;  Location: [facility];  Service: Gynecology;  Laterality: N/A;  With Resection of polyp with truclear system   REPLACEMENT TOTAL HIP W/  RESURFACING IMPLANTS  2002   left   TUBAL LIGATION       Current Outpatient Medications  Medication Sig Dispense Refill   acetaminophen (TYLENOL) 500 MG tablet 1 tab po bid PRN     ACID CONTROLLER MAX ST 20 MG tablet TAKE 1 TABLET BY MOUTH BID     amoxicillin-clavulanate (AUGMENTIN) 875-125 MG tablet Take 1 tablet by mouth 2 (two) times daily. 20 tablet 0   aspirin 81 MG tablet Take 81 mg by mouth daily.      atorvastatin (LIPITOR) 40 MG tablet Take 40 mg by mouth daily.  1   calcium citrate-vitamin D (CITRACAL+D) 315-200 MG-UNIT tablet Take by mouth.     carvedilol (COREG) 12.5 MG tablet TAKE 1 TABLET BY MOUTH TWICE DAILY WITH A MEAL 180 tablet 3   clobetasol cream (TEMOVATE) 4.70 % Apply 1 application topically 2 (two) times daily.      estradiol (ESTRACE) 0.1 MG/GM vaginal cream estradiol 0.01% (0.1 mg/gram) vaginal cream apply locally 3 times per week 42.5 g 0   furosemide (LASIX) 40 MG tablet TAKE 1 TABLET(40 MG) BY MOUTH DAILY 90 tablet 3   latanoprost (XALATAN) 0.005 % ophthalmic solution Place 1 drop into both eyes at bedtime.  0   LORazepam (ATIVAN) 0.5 MG tablet Take 0.5 mg by mouth as needed for anxiety.      losartan (COZAAR) 50 MG tablet Take 50 mg by mouth daily. On Monday Wednesday and Friday     melatonin 5 MG TABS Take 1 tablet by mouth at bedtime.     metoCLOPramide (REGLAN) 5 MG tablet TAKE 1 TABLET BY MOUTH AT BEDTIME FOR REFLUX     Multiple Vitamins-Minerals (CENTRUM SILVER ULTRA WOMENS) TABS Take 1 tablet by mouth daily.     mupirocin ointment (BACTROBAN) 2 % Apply 1 application topically 2 (two) times daily.  30 g 2   NAPROXEN PO Take 625 mg by mouth as needed.     nitroGLYCERIN (NITROSTAT) 0.4 MG SL tablet PLACE 1 TABLET UNDER THE TONGUE IF NEEDED EVERY 5 MINUTES FOR CHEST PAIN 25 tablet 5   PRESCRIPTION MEDICATION Place 1 mL vaginally as directed. inser 1 ml vaginally 2-3 times per week as directed  Estradiol Micronized  0.02% cream (is compounded for patient)     risperiDONE (RISPERDAL) 1 MG tablet Take 1 tablet by mouth as needed.     sodium fluoride (FLUORISHIELD) 1.1 % GEL dental gel PreviDent 5000 Booster Plus 1.1 % dental paste     venlafaxine XR (EFFEXOR-XR) 75 MG 24 hr capsule Take 3 capsules by mouth daily.     Vitamin D, Ergocalciferol, (DRISDOL) 50000 UNITS CAPS Take 50,000 Units by mouth every 30 (thirty) days.      amoxicillin (AMOXIL) 500 MG capsule Take 4 capsules by mouth as needed. 1 hr prior to dental work (Patient not taking: Reported on 05/06/2021)     levofloxacin (LEVAQUIN) 750 MG tablet Take 1 tablet (750 mg total) by mouth daily. (Patient not taking: Reported on 05/06/2021) 10 tablet 0  moxifloxacin (VIGAMOX) 0.5 % ophthalmic solution moxifloxacin 0.5 % eye drops (Patient not taking: Reported on 05/06/2021)     Multiple Vitamin (MULTI-VITAMIN PO) Take 1 tablet by mouth daily.  (Patient not taking: Reported on 05/06/2021)     prednisoLONE acetate (PRED FORTE) 1 % ophthalmic suspension prednisolone acetate 1 % eye drops,suspension (Patient not taking: Reported on 05/06/2021)     No current facility-administered medications for this visit.    Allergies:   Celecoxib and Nickel    Social History:  The patient  reports that she has never smoked. She has never used smokeless tobacco. She reports that she does not drink alcohol and does not use drugs.   Family History:  The patient's family history includes Cancer in her sister; Stroke in her maternal grandfather and mother.    ROS:   Noted in current history, otherwise review of systems is negative.  Physical Exam: Blood  pressure 130/68, pulse 74, height 5' 6.5" (1.689 m), weight 208 lb 6.4 oz (94.5 kg), SpO2 94 %.  GEN:  elderly female,  HEENT: Normal NECK: No JVD; No carotid bruits LYMPHATICS: No lymphadenopathy CARDIAC: RRR , no murmurs, rubs, gallops RESPIRATORY:  Clear to auscultation without rales, wheezing or rhonchi  ABDOMEN: Soft, non-tender, non-distended MUSCULOSKELETAL:  No edema; No deformity  SKIN: Warm and dry NEUROLOGIC:  Alert and oriented x 3    EKG:       Recent Labs: 05/10/2020: ALT 15; BUN 14; Creatinine, Ser 0.87; Potassium 4.2; Sodium 138    Lipid Panel    Component Value Date/Time   CHOL 162 05/10/2020 1608   TRIG 187 (H) 05/10/2020 1608   HDL 55 05/10/2020 1608   CHOLHDL 2.9 05/10/2020 1608   CHOLHDL 2.7 05/27/2016 1044   VLDL 23 05/27/2016 1044   LDLCALC 76 05/10/2020 1608      Wt Readings from Last 3 Encounters:  05/06/21 208 lb 6.4 oz (94.5 kg)  10/30/20 208 lb (94.3 kg)  05/10/20 212 lb (96.2 kg)      Other studies Reviewed: Additional studies/ records that were reviewed today include: . Review of the above records demonstrates:    ASSESSMENT AND PLAN:  1. Chronic systolic CHF-      doing well.  Cont current meds.   2. CAD :     no angina   3. Hyperlipidemia -    lipids have been well controlled.   Check labs today  Advised continued weight loss     3. Left hip replacement;  seems to be doing well.          Current medicines are reviewed at length with the patient today.  The patient does not have concerns regarding medicines.  The following changes have been made:  no change  Labs/ tests ordered today include:   No orders of the defined types were placed in this encounter.   Disposition:   FU with me  or APP in 1 year.     Mertie Moores, MD  05/06/2021 10:32 AM    [facility] [place], [place], [place]  [address] Phone: [phone]; Fax: [phone]

## 2021-05-06 ENCOUNTER — Ambulatory Visit: Payer: Medicare PPO | Admitting: Cardiovascular Disease

## 2021-05-06 ENCOUNTER — Other Ambulatory Visit: Payer: Self-pay

## 2021-05-06 ENCOUNTER — Encounter: Payer: Self-pay | Admitting: Cardiovascular Disease

## 2021-05-06 VITALS — BP 130/68 | HR 74 | Ht 66.5 in | Wt 208.4 lb

## 2021-05-06 DIAGNOSIS — I10 Essential (primary) hypertension: Secondary | ICD-10-CM

## 2021-05-06 DIAGNOSIS — I5042 Chronic combined systolic (congestive) and diastolic (congestive) heart failure: Secondary | ICD-10-CM | POA: Diagnosis not present

## 2021-05-06 DIAGNOSIS — I2510 Atherosclerotic heart disease of native coronary artery without angina pectoris: Secondary | ICD-10-CM

## 2021-05-06 LAB — BASIC METABOLIC PANEL
BUN/Creatinine Ratio: 19 (ref 12–28)
BUN: 15 mg/dL (ref 8–27)
CO2: 29 mmol/L (ref 20–29)
Calcium: 9.6 mg/dL (ref 8.7–10.3)
Chloride: 98 mmol/L (ref 96–106)
Creatinine, Ser: 0.8 mg/dL (ref 0.57–1.00)
Glucose: 95 mg/dL (ref 70–99)
Potassium: 4.6 mmol/L (ref 3.5–5.2)
Sodium: 140 mmol/L (ref 134–144)
eGFR: 75 mL/min/{1.73_m2} (ref >59)

## 2021-05-06 LAB — LIPID PANEL
Chol/HDL Ratio: 2.9 ratio (ref 0.0–4.4)
Cholesterol, Total: 150 mg/dL (ref 100–199)
HDL: 52 mg/dL (ref >39)
LDL Chol Calc (NIH): 79 mg/dL (ref 0–99)
Triglycerides: 102 mg/dL (ref 0–149)
VLDL Cholesterol Cal: 19 mg/dL (ref 5–40)

## 2021-05-06 LAB — ALT: ALT: 16 IU/L (ref 0–32)

## 2021-05-06 NOTE — Patient Instructions (Signed)
Medication Instructions:  Your physician recommends that you continue on your current medications as directed. Please refer to the Current Medication list given to you today.  *If you need a refill on your cardiac medications before your next appointment, please call your pharmacy*  Lab Work: Your physician recommends that you have lab work today- BMET, ALT, and Lipids  If you have labs (blood work) drawn today and your tests are completely normal, you will receive your results only by: MyChart Message (if you have [place]) OR A paper copy in the mail If you have any lab test that is abnormal or we need to change your treatment, we will call you to review the results.  Follow-Up: At [facility], you and your health needs are our priority.  As part of our continuing mission to provide you with exceptional heart care, we have created designated Provider Care Teams.  These Care Teams include your primary Cardiologist (physician) and Advanced Practice Providers (APPs -  Physician Assistants and Nurse Practitioners) who all work together to provide you with the care you need, when you need it.  We recommend signing up for the patient portal called "MyChart".  Sign up information is provided on this After Visit Summary.  MyChart is used to connect with patients for Virtual Visits (Telemedicine).  Patients are able to view lab/test results, encounter notes, upcoming appointments, etc.  Non-urgent messages can be sent to your provider as well.   To learn more about what you can do with MyChart, go to [email or web address].    Your next appointment:   1 year(s)  The format for your next appointment:   In Person  Provider:   Mertie Moores, MD  or Christen Bame, NP     :1}   Other Instructions For COVID Zinc - 50 mg elemental zinc in the form of Zinc Gluconate Vit D Vit C 1000 mg a day  Gargle with Listerine twice a day  Call Dr. Philip Aspen if you catch COVID

## 2021-05-13 DIAGNOSIS — F429 Obsessive-compulsive disorder, unspecified: Secondary | ICD-10-CM | POA: Diagnosis not present

## 2021-05-17 ENCOUNTER — Other Ambulatory Visit: Payer: Self-pay | Admitting: Cardiovascular Disease

## 2021-05-22 ENCOUNTER — Encounter: Payer: Self-pay | Admitting: Family

## 2021-05-22 ENCOUNTER — Encounter: Payer: Self-pay | Admitting: Physical Medicine & Rehabilitation

## 2021-05-28 DIAGNOSIS — F429 Obsessive-compulsive disorder, unspecified: Secondary | ICD-10-CM | POA: Diagnosis not present

## 2021-06-03 DIAGNOSIS — F329 Major depressive disorder, single episode, unspecified: Secondary | ICD-10-CM | POA: Diagnosis not present

## 2021-06-05 ENCOUNTER — Encounter: Payer: Self-pay | Admitting: Podiatry

## 2021-06-05 ENCOUNTER — Other Ambulatory Visit: Payer: Self-pay

## 2021-06-05 ENCOUNTER — Ambulatory Visit: Payer: Medicare PPO | Admitting: Podiatry

## 2021-06-05 ENCOUNTER — Telehealth: Payer: Self-pay | Admitting: Podiatry

## 2021-06-05 DIAGNOSIS — M79676 Pain in unspecified toe(s): Secondary | ICD-10-CM | POA: Diagnosis not present

## 2021-06-05 DIAGNOSIS — B351 Tinea unguium: Secondary | ICD-10-CM

## 2021-06-05 DIAGNOSIS — L97521 Non-pressure chronic ulcer of other part of left foot limited to breakdown of skin: Secondary | ICD-10-CM | POA: Diagnosis not present

## 2021-06-05 NOTE — Telephone Encounter (Signed)
Is she suppose to use a pumice stone on her wound after she soaks it in the Epsom Salts ? Please Advice

## 2021-06-07 NOTE — Progress Notes (Signed)
She presents today concerned that she is redeveloped an ulceration to the medial aspect of the hallux left.  She states that it look like a blister that is been present for about a week or so but I am concerned that the ulcer has opened back up.  She is also concerned about a painful elongated toenails.  Objective: Vital signs stable alert oriented x3.  Pulses are palpable.  Severe hallux valgus deformity rigid in nature has resulted and valgus rotation of her hallux left which also demonstrates a very superficial blister I debrided the blister today and it looks good it is not deep there is no purulence no signs of infection associated with it at all.  Toenails are long thick yellow dystrophic clinically mycotic.  No open lesions or holes anywhere else.  Assessment: Superficial ulceration hallux left.  Pain in limb secondary to onychomycosis bilateral.  Plan: Debrided toenails 1 through 5 bilateral.  Debrided ulceration now measuring 0.7 cm in diameter.  I recommended that she continue to soak Epson salts warm water applied dressing daily and I will take a look at it again in another few weeks.  She will call with questions or concerns.

## 2021-06-10 NOTE — Telephone Encounter (Signed)
Returned the call to patient giving physician's instructions , not to use a pumice stone on wound ,verbalized understanding.

## 2021-06-11 DIAGNOSIS — M62838 Other muscle spasm: Secondary | ICD-10-CM | POA: Diagnosis not present

## 2021-06-11 DIAGNOSIS — R2681 Unsteadiness on feet: Secondary | ICD-10-CM | POA: Diagnosis not present

## 2021-06-11 DIAGNOSIS — M545 Low back pain, unspecified: Secondary | ICD-10-CM | POA: Diagnosis not present

## 2021-06-17 ENCOUNTER — Telehealth: Payer: Self-pay | Admitting: Cardiovascular Disease

## 2021-06-17 NOTE — Telephone Encounter (Signed)
Ok to take B12

## 2021-06-17 NOTE — Telephone Encounter (Signed)
Returned call and advised ok to take B12

## 2021-06-17 NOTE — Telephone Encounter (Signed)
Pt c/o medication issue:  1. Name of Medication:  Vitamin B12  2. How are you currently taking this medication (dosage and times per day)?  Patient hasn't started  3. Are you having a reaction (difficulty breathing--STAT)?  No   4. What is your medication issue?   Patient states she currently takes Vitamin C 1000 MG, Zinc 50 MG, and Vitamin D3. She would like to confirm whether it is safe to add Vitamin B12 to her diet. Please advise.

## 2021-07-02 ENCOUNTER — Other Ambulatory Visit: Payer: Self-pay | Admitting: Cardiovascular Disease

## 2021-07-09 DIAGNOSIS — F429 Obsessive-compulsive disorder, unspecified: Secondary | ICD-10-CM | POA: Diagnosis not present

## 2021-07-10 ENCOUNTER — Other Ambulatory Visit: Payer: Self-pay | Admitting: Obstetrics and Gynecology

## 2021-07-10 DIAGNOSIS — Z1231 Encounter for screening mammogram for malignant neoplasm of breast: Secondary | ICD-10-CM

## 2021-07-14 DIAGNOSIS — M21372 Foot drop, left foot: Secondary | ICD-10-CM | POA: Diagnosis not present

## 2021-07-14 DIAGNOSIS — M25561 Pain in right knee: Secondary | ICD-10-CM | POA: Diagnosis not present

## 2021-07-14 DIAGNOSIS — M1711 Unilateral primary osteoarthritis, right knee: Secondary | ICD-10-CM | POA: Diagnosis not present

## 2021-07-14 DIAGNOSIS — L84 Corns and callosities: Secondary | ICD-10-CM | POA: Diagnosis not present

## 2021-07-15 ENCOUNTER — Ambulatory Visit: Payer: Medicare PPO | Admitting: Podiatry

## 2021-07-22 ENCOUNTER — Encounter: Payer: Medicare PPO | Attending: Physical Medicine & Rehabilitation | Admitting: Physical Medicine & Rehabilitation

## 2021-07-22 ENCOUNTER — Encounter: Payer: Self-pay | Admitting: Physical Medicine & Rehabilitation

## 2021-07-22 ENCOUNTER — Other Ambulatory Visit: Payer: Self-pay

## 2021-07-22 VITALS — BP 119/72 | HR 68 | Ht 66.5 in | Wt 211.6 lb

## 2021-07-22 DIAGNOSIS — M24572 Contracture, left ankle: Secondary | ICD-10-CM

## 2021-07-22 DIAGNOSIS — M25561 Pain in right knee: Secondary | ICD-10-CM | POA: Diagnosis not present

## 2021-07-22 DIAGNOSIS — G8929 Other chronic pain: Secondary | ICD-10-CM

## 2021-07-22 NOTE — Patient Instructions (Signed)
Xrays ordered PT ordered May need injection Right knee

## 2021-07-22 NOTE — Progress Notes (Signed)
Subjective:    Patient ID: Kristin Ina, Kristin Mack, female    DOB: 02/08/1941, 81 y.o.   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  The patient has had foot drop since a right total hip replacement performed in 2003.  The hip replacement has functioned well for her however she had no improvement of her strength at the foot and ankle over time.  She also complains of right knee pain and thinks she may need right knee replacement.  She was told that she had severe arthritis in the knee in the past but cannot remember exactly when that was or if she has had x-rays.  Her pain is described as sharp in the knee dull and foot throbbing in the back.  Her walking tolerance is 5 minutes.  Her pain increases with standing worse based on activity no sleep disturbance. Orthotics include left AFO.  She also has an old left night splint which she states was uncomfortable and she did not tolerate well. The patient uses topical pain medications for her pain as well as 1-2 Tylenol per day and 1-2 naproxen per day.  She also ices her right knee every 2 hours as needed.  He exercises 3 days a week at the [facility] and is moderate exercise time per sections 45 minutes to 1 hour she does chair yoga and water aerobics.  Average standing time per day approximately half hour average walking time per day.  Other past history significant cervicalgia  Pain Inventory Average Pain 8 Pain Right Now 8 My pain is intermittent, constant, sharp, dull, and throbbing in back  In the last 24 hours, has pain interfered with the following? General activity 2 Relation with others 0 Enjoyment of life 2 What TIME of day is your pain at its worst? varies Sleep (in general) Good  Pain is worse with: standing Pain improves with: rest and heat/ice Relief from Meds: 8  walk with assistance use a cane use a walker how many minutes can you walk? 5 ability to climb  steps?  no do you drive?  yes Do you have any goals in this area?  yes  retired  bladder control problems trouble walking depression anxiety  Any changes since last visit?  no  Any changes since last visit?  no    Family History  Problem Relation Age of Onset   Stroke Mother    Cancer Sister    Stroke Maternal Grandfather    Breast cancer Neg Hx    Social History   Socioeconomic History   Marital status: Divorced    Spouse name: Not on file   Number of children: Not on file   Years of education: Not on file   Highest education level: Not on file  Occupational History   Not on file  Tobacco Use   Smoking status: Never   Smokeless tobacco: Never  Vaping Use   Vaping Use: Never used  Substance and Sexual Activity   Alcohol use: No    Alcohol/week: 0.0 standard drinks   Drug use: No   Sexual activity: Yes    Birth control/protection: Surgical  Other Topics Concern   Not on file  Social History Narrative   Not on file   Social Determinants of Health   Financial Resource Strain: Not on file  Food Insecurity: Not on file  Transportation Needs: Not on file  Physical Activity: Not on file  Stress: Not on file  Social Connections: Not on file   Past Surgical History:  Procedure Laterality Date   BREAST BIOPSY Right    x2   CARDIAC CATHETERIZATION     Ejection Fraction was around 45-50%   HYSTEROSCOPY  09/03/2011   Procedure: HYSTEROSCOPY;  Surgeon: Logan Bores, MD;  Location: [facility];  Service: Gynecology;  Laterality: N/A;  With Resection of polyp with truclear system   REPLACEMENT TOTAL HIP W/  RESURFACING IMPLANTS  2002   left   TUBAL LIGATION     Past Medical History:  Diagnosis Date   Carpal tunnel syndrome    Left Hand   Chest pain    CHF (congestive heart failure) (HCC)    Dilated cardiomyopathy. Her original ejection fraction was 10-20%. Her ejection fraction has improved to 57% by 2009   Coronary artery disease    Depression     Dyslipidemia    Foot drop, left    GERD (gastroesophageal reflux disease)    Headache(784.0)    Hemorrhoids    History of anxiety    Hypertension    Lower back pain    Myocardial infarction [facility])    Obesity    Osteoarthritis    Severe with collapse of the left hip   Single functional kidney    single right kidney,  left kidney is atrophic   Ht 5' 6.5" (1.689 m)    Wt 211 lb 9.6 oz (96 kg)    BMI 33.64 kg/m   Opioid Risk Score:   Fall Risk Score:  `1  Depression screen PHQ 2/9  Depression screen [facility] 2/9 07/22/2021 06/27/2016 10/20/2014  Decreased Interest 0 0 0  Down, Depressed, Hopeless 1 0 0  PHQ - 2 Score 1 0 0  Altered sleeping 3 - -  Tired, decreased energy 2 - -  Change in appetite 1 - -  Feeling bad or failure about yourself  0 - -  Trouble concentrating 0 - -  Moving slowly or fidgety/restless 0 - -  Suicidal thoughts 0 - -  PHQ-9 Score 7 - -  Difficult doing work/chores Somewhat difficult - -     Review of Systems  Constitutional: Negative.   HENT: Negative.    Eyes: Negative.   Respiratory: Negative.    Cardiovascular: Negative.   Gastrointestinal: Negative.   Endocrine: Negative.   Genitourinary:  Positive for urgency.  Musculoskeletal:  Positive for back pain.  Skin: Negative.   Allergic/Immunologic: Negative.   Neurological:  Positive for weakness and numbness.  Hematological: Negative.   Psychiatric/Behavioral:  Positive for dysphoric mood. The patient is nervous/anxious.       Objective:   Physical Exam Vitals and nursing note reviewed.  Constitutional:      Appearance: She is obese.  HENT:     Head: Normocephalic and atraumatic.  Eyes:     Extraocular Movements: Extraocular movements intact.     Conjunctiva/sclera: Conjunctivae normal.     Pupils: Pupils are equal, round, and reactive to light.  Musculoskeletal:        General: Normal range of motion.     Cervical back: Normal range of motion.     Right lower leg: No edema.     Left  lower leg: No edema.  Skin:    General: Skin is warm and dry.  Neurological:     Mental Status: She is alert and oriented to person, place, and time. Mental status is at baseline.     Deep  Tendon Reflexes:     Reflex Scores:      Patellar reflexes are 1+ on the right side and 1+ on the left side.      Achilles reflexes are 1+ on the right side and 0 on the left side. Psychiatric:        Mood and Affect: Mood normal.        Behavior: Behavior normal.        Thought Content: Thought content normal.        Judgment: Judgment normal.    Knee ROM Right ext -20 deg  Right knee flex 100 deg Left ext -10 deg Left knee flex 120  Ankle ROM Right DF 90 deg Left ankle DF 65 deg  MMT 5/5 RLE HF, KE, Hamstring , ADF, PF 5/5 L knee ext, HF, Hamstring , PF 0/5 L ankle DF  Decreased pp left L5 and S1   EDB atrophy on Left side     Assessment & Plan:   Chronic left foot drop of 20 years duration.  This with patient do not think with exercise therapy will improve her strength.  In terms of stretching, I do think it would be beneficial to at least maintain her current range of motion but may not be able to increase it given soft tissue shortening. In regards to a night boot she did not have good tolerance of this in the past.  This could only retard further worsening of range of motion and not correct existing problems. We discussed that hallux valgus right  and left side as well as some rotation of her toe she is wearing bearing weight on the lateral border of her great toe on the left side.  She is scheduled to see podiatry which is appropriate.  Right knee pain.  Recommend x-rays given there are no imaging studies in her medical record.  Would suspect she may have osteoarthritis plus minus meniscal degeneration.  She may benefit from corticosteroid injection or Synvisc  gel injection.  We discussed we could assess this further at next visit.  I am referring her for outpatient physical therapy at  [facility], may incorporate aquatic therapy.  I will see her back in about 6 to 8 weeks.

## 2021-07-23 DIAGNOSIS — M1711 Unilateral primary osteoarthritis, right knee: Secondary | ICD-10-CM | POA: Diagnosis not present

## 2021-07-23 DIAGNOSIS — F429 Obsessive-compulsive disorder, unspecified: Secondary | ICD-10-CM | POA: Diagnosis not present

## 2021-07-23 DIAGNOSIS — M25561 Pain in right knee: Secondary | ICD-10-CM | POA: Diagnosis not present

## 2021-07-24 DIAGNOSIS — R29890 Loss of height: Secondary | ICD-10-CM | POA: Diagnosis not present

## 2021-07-24 DIAGNOSIS — K219 Gastro-esophageal reflux disease without esophagitis: Secondary | ICD-10-CM | POA: Diagnosis not present

## 2021-07-24 DIAGNOSIS — M8589 Other specified disorders of bone density and structure, multiple sites: Secondary | ICD-10-CM | POA: Diagnosis not present

## 2021-07-24 DIAGNOSIS — M47817 Spondylosis without myelopathy or radiculopathy, lumbosacral region: Secondary | ICD-10-CM | POA: Diagnosis not present

## 2021-07-24 DIAGNOSIS — Z9181 History of falling: Secondary | ICD-10-CM | POA: Diagnosis not present

## 2021-07-24 DIAGNOSIS — M21372 Foot drop, left foot: Secondary | ICD-10-CM | POA: Diagnosis not present

## 2021-07-24 DIAGNOSIS — Z780 Asymptomatic menopausal state: Secondary | ICD-10-CM | POA: Diagnosis not present

## 2021-07-24 DIAGNOSIS — M6158 Other ossification of muscle, other site: Secondary | ICD-10-CM | POA: Diagnosis not present

## 2021-07-24 DIAGNOSIS — M25561 Pain in right knee: Secondary | ICD-10-CM | POA: Diagnosis not present

## 2021-07-24 DIAGNOSIS — M5184 Other intervertebral disc disorders, thoracic region: Secondary | ICD-10-CM | POA: Diagnosis not present

## 2021-07-24 DIAGNOSIS — Z79899 Other long term (current) drug therapy: Secondary | ICD-10-CM | POA: Diagnosis not present

## 2021-07-24 DIAGNOSIS — M4316 Spondylolisthesis, lumbar region: Secondary | ICD-10-CM | POA: Diagnosis not present

## 2021-07-24 DIAGNOSIS — M47816 Spondylosis without myelopathy or radiculopathy, lumbar region: Secondary | ICD-10-CM | POA: Diagnosis not present

## 2021-07-24 DIAGNOSIS — M8580 Other specified disorders of bone density and structure, unspecified site: Secondary | ICD-10-CM | POA: Diagnosis not present

## 2021-07-24 DIAGNOSIS — Z96642 Presence of left artificial hip joint: Secondary | ICD-10-CM | POA: Diagnosis not present

## 2021-07-24 DIAGNOSIS — M438X4 Other specified deforming dorsopathies, thoracic region: Secondary | ICD-10-CM | POA: Diagnosis not present

## 2021-07-24 DIAGNOSIS — M4186 Other forms of scoliosis, lumbar region: Secondary | ICD-10-CM | POA: Diagnosis not present

## 2021-07-25 DIAGNOSIS — H5340 Unspecified visual field defects: Secondary | ICD-10-CM | POA: Diagnosis not present

## 2021-07-25 DIAGNOSIS — H5310 Unspecified subjective visual disturbances: Secondary | ICD-10-CM | POA: Diagnosis not present

## 2021-07-29 NOTE — Therapy (Signed)
OUTPATIENT PHYSICAL THERAPY LOWER EXTREMITY EVALUATION   Patient Name: Kristin PLATNER, PhD MRN: [id number] DOB:09-Jun-1941, 81 y.o., female Today's Date: 07/30/2021   PT End of Session - 07/30/21 0858     Visit Number 1    Number of Visits 16    Date for PT Re-Evaluation 09/17/21    Authorization Time Period 8 weeks    Progress Note Due on Visit 10    PT Start Time 0900    PT Stop Time 0945    PT Time Calculation (min) 45 min    Activity Tolerance Patient tolerated treatment well;Patient limited by pain    Behavior During Therapy [facility] for tasks assessed/performed             Past Medical History:  Diagnosis Date   Carpal tunnel syndrome    Left Hand   Chest pain    CHF (congestive heart failure) (HCC)    Dilated cardiomyopathy. Her original ejection fraction was 10-20%. Her ejection fraction has improved to 57% by 2009   Coronary artery disease    Depression    Dyslipidemia    Foot drop, left    GERD (gastroesophageal reflux disease)    Headache(784.0)    Hemorrhoids    History of anxiety    Hypertension    Lower back pain    Myocardial infarction ([place])    Obesity    Osteoarthritis    Severe with collapse of the left hip   Single functional kidney    single right kidney,  left kidney is atrophic   Past Surgical History:  Procedure Laterality Date   BREAST BIOPSY Right    x2   CARDIAC CATHETERIZATION     Ejection Fraction was around 45-50%   HYSTEROSCOPY  09/03/2011   Procedure: HYSTEROSCOPY;  Surgeon: Logan Bores, MD;  Location: [facility];  Service: Gynecology;  Laterality: N/A;  With Resection of polyp with truclear system   REPLACEMENT TOTAL HIP W/  RESURFACING IMPLANTS  2002   left   TUBAL LIGATION     Patient Active Problem List   Diagnosis Date Noted   Pain in right knee 10/12/2019   Complete tear of right rotator cuff 06/18/2016   Localized, primary osteoarthritis of shoulder region, right 06/18/2016   Coronary artery disease involving  native coronary artery of native heart without angina pectoris 05/27/2016   Height loss 01/09/2016   History of total left hip arthroplasty 01/09/2016   Left foot drop 01/09/2016   Postmenopausal status 01/09/2016   Hypokalemia 03/30/2013   Abscess of axilla, right 08/31/2011   Cough 07/29/2011   Sinusitis 20/25/4270   Chronic systolic CHF (congestive heart failure) ([place]) 03/11/2011   Hyperlipidemia 03/11/2011   Carpal tunnel syndrome    GERD (gastroesophageal reflux disease)    Other specified abnormal findings of blood chemistry 11/22/2009   Vitamin D deficiency 11/22/2009   Rash and other nonspecific skin eruption 11/14/2009   Atrophic vaginitis 11/12/2009   Depressive disorder, not elsewhere classified 11/12/2009   Onychomycosis due to dermatophyte 11/12/2009   Arthropathy 11/07/2009   Congestive heart failure ([place]) 11/07/2009   Low back pain 11/07/2009   Other acquired deformity of ankle and foot(736.79) 11/07/2009   Anxiety disorder 09/20/2009   Cardiomyopathy ([place]) 09/20/2009   Essential hypertension 09/20/2009   Nocturia 09/20/2009    PCP: Donnajean Lopes, MD  REFERRING PROVIDER: Charlett Blake, MD  REFERRING DIAG:  [phone] (ICD-10-CM) - Chronic pain of right knee  M24.572 (ICD-10-CM) - Ankle contracture, left  THERAPY DIAG:  Chronic Pain Right Knee M25.561G89.29 Difficulty in walking R26.2 Muscle weakness M62.81 Other abnormalities of gait R26.89 Unsteadiness of feet  R26.81   ONSET DATE: 08/20/2021  SUBJECTIVE:   SUBJECTIVE STATEMENT: Pt reports pain sx increased about 5 weeks ago. HAs been relying on rle more over past 20 years due to foot drop left ankle s/p left THR. Pain limits her ability to stand or walk long distances. She grocery shops using buggy and is unable to stand to cook or complete other household chores. Pt does go to water aerobics x 3 weekly to [facility] which relieves her pain. She has been completing >10 years,  PERTINENT  HISTORY: Right total hip replacement performed in 2003 Chronic left foot drop of 20 years duration with Left AFO  Hallux valgus right  and left side as well as some rotation of her toe she is wearing bearing weight on the lateral border of her great toe on the left side.  Right knee pain.  Possible osteoarthritis plus minus meniscal degeneration.    PAIN:  Are you having pain? Yes NPRS scale: 8/10 Pain location: Knee Pain orientation: Right  PAIN TYPE: aching Pain description: intermittent  Aggravating factors: standing, walking Relieving factors: sitting, water activity  PRECAUTIONS: Falls Right foot drop with AFO  WEIGHT BEARING RESTRICTIONS No  FALLS:  Has patient fallen in last 6 months? No, Number of falls:   LIVING ENVIRONMENT: Lives with: lives with their family Lives in: House/apartment Stairs: No;  Has following equipment at home: Single point cane, Environmental consultant - 4 wheeled, and raised commode  OCCUPATION: Retired Pharmacist, hospital  PLOF: Independent with basic ADLs  PATIENT GOALS   Be able to walk and continue with aquatic therapy   OBJECTIVE:   DIAGNOSTIC FINDINGS: no diagnostics  PATIENT SURVEYS:  FOTO 33  COGNITION:  Overall cognitive status: Within functional limits for tasks assessed     SENSATION:  Light touch: Deficits :decreased sensation (light touch) right distal foot   POSTURE:  Slightly kyphotic  PALPATION: Tenderness prepatellar R  LE AROM/PROM:  A/PROM Right 07/30/2021 Left 07/30/2021  Hip flexion    Hip extension    Hip abduction    Hip adduction    Hip internal rotation    Hip external rotation    Knee flexion 110 wfl  Knee extension -25 wfl  Ankle dorsiflexion wfl 5  Ankle plantarflexion wfl wfl  Ankle inversion    Ankle eversion     (Blank rows = not tested)  LE MMT:  MMT Right 07/30/2021 Left 07/30/2021  Hip flexion 4 4                      Knee flexion 3+ 5  Knee extension 3+ 5  Ankle dorsiflexion  2  Ankle  plantarflexion  4           (Blank rows = not tested)   FUNCTIONAL TESTS:  5 times sit to stand: unable to complete limited to pain Timed up and go (TUG): 18 sec  GAIT: Distance walked: 500 ft Assistive device utilized: Single point cane Level of assistance: Modified independence Comments: Antalgic gait RLE due to decreased and Painful ROM right knee    TODAY'S TREATMENT: Evaluation; HEP    PATIENT EDUCATION:  Education details: dx and management of condition Person educated: Patient Education method: Explanation and demonstration Education comprehension: returned demonstration and verbal   HOME EXERCISE PROGRAM: Access Code: [email or web address] URL: [email or web address] Date: 07/30/2021 Prepared by: Denton Meek  Exercises Supine Quad Set - 1 x daily - 7 x weekly - 3 sets - 10 reps Supine Heel Slides - 1 x daily - 7 x weekly - 3 sets - 10 reps Active Straight Leg Raise with Quad Set - 1 x daily - 7 x weekly - 3 sets - 10 reps Supine Hip Abduction - 1 x daily - 7 x weekly - 3 sets - 10 reps  ASSESSMENT:  CLINICAL IMPRESSION: Patient is a 81 y.o. female who was seen today for physical therapy evaluation and treatment for right knee pain. Objective impairments include Abnormal gait; Decreased activity tolerance; Decreased range of motion; Decreased strength; Decreased balance; Decreased mobility; Difficulty walking; Increased edema; Impaired sensation; Pain. These impairments are limiting patient from  Bathing; Locomotion Level; Continence; Squat; Stairs; Stand; Transfers . Personal factors including  Age; Comorbidity 2; Comorbidity 1  are also affecting patient's functional outcome. Patient will benefit from skilled PT to address above impairments and improve overall function.  REHAB POTENTIAL: Good  CLINICAL DECISION MAKING: Stable/uncomplicated  EVALUATION COMPLEXITY: Low   GOALS: Goals reviewed with patient? no  SHORT TERM GOALS:  STG Name Target Date Goal  status  1 Pt will improve on FOTO from 33 to 39 progressing pt from a community Ambulator toward an advanced Ambulator Baseline: 33 08/20/2021 INITIAL  2 Pt will be independent and compliant with HEP to decrease pain and improve ROM Baseline:  08/20/2021 INITIAL  3 Right knee extension will improve from -25 to -20 degrees to improve heel strike with ambulation Baseline: 08/20/2021 INITIAL  4 Right knee pain will decrease to <or= 5/10 for improved mobility Baseline: 08/20/2021 INITIAL                  LONG TERM GOALS:   LTG Name Target Date Goal status  1 Pt will improve on FOTO from 33 to 43 progressing pt from a community Ambulator to an advanced Ambulator Baseline: 09/24/2021 INITIAL  2 Right knee extension will improve to < -20d Baseline: 09/24/2021 INITIAL  3 Pt will improve TUG to < 14 seconds to demonstrate improved strength and ROM. Baseline:18 09/24/2021 INITIAL  4 Pt will improve in right knee flex and extension strength from 3+/5 to >or= 4+/5 Baseline: 09/24/2021 INITIAL                  PLAN: PT FREQUENCY: 1-2x/week  PT DURATION: 8 weeks  PLANNED INTERVENTIONS: Therapeutic exercises, Therapeutic activity, Neuro Muscular re-education, Balance training, Gait training, Patient/Family education, Joint mobilization, Aquatic Therapy, Cryotherapy, Moist heat, and Taping  PLAN FOR NEXT SESSION: Aquatics for quad strengthening; balance and improved gait   Stanton Kidney ([place])  MPT 07/30/2021, 5:42 PM

## 2021-07-30 ENCOUNTER — Ambulatory Visit (HOSPITAL_BASED_OUTPATIENT_CLINIC_OR_DEPARTMENT_OTHER): Payer: Medicare PPO | Attending: Physical Medicine & Rehabilitation | Admitting: Physical Therapy

## 2021-07-30 ENCOUNTER — Other Ambulatory Visit: Payer: Self-pay

## 2021-07-30 ENCOUNTER — Encounter (HOSPITAL_BASED_OUTPATIENT_CLINIC_OR_DEPARTMENT_OTHER): Payer: Self-pay | Admitting: Physical Therapy

## 2021-07-30 DIAGNOSIS — M24572 Contracture, left ankle: Secondary | ICD-10-CM | POA: Insufficient documentation

## 2021-07-30 DIAGNOSIS — M25561 Pain in right knee: Secondary | ICD-10-CM | POA: Diagnosis not present

## 2021-07-30 DIAGNOSIS — R262 Difficulty in walking, not elsewhere classified: Secondary | ICD-10-CM

## 2021-07-30 DIAGNOSIS — R2689 Other abnormalities of gait and mobility: Secondary | ICD-10-CM | POA: Insufficient documentation

## 2021-07-30 DIAGNOSIS — M6281 Muscle weakness (generalized): Secondary | ICD-10-CM | POA: Insufficient documentation

## 2021-07-30 DIAGNOSIS — G8929 Other chronic pain: Secondary | ICD-10-CM | POA: Insufficient documentation

## 2021-07-30 DIAGNOSIS — R2681 Unsteadiness on feet: Secondary | ICD-10-CM | POA: Insufficient documentation

## 2021-07-31 DIAGNOSIS — Z780 Asymptomatic menopausal state: Secondary | ICD-10-CM | POA: Diagnosis not present

## 2021-07-31 DIAGNOSIS — M85851 Other specified disorders of bone density and structure, right thigh: Secondary | ICD-10-CM | POA: Diagnosis not present

## 2021-08-04 ENCOUNTER — Ambulatory Visit (HOSPITAL_BASED_OUTPATIENT_CLINIC_OR_DEPARTMENT_OTHER): Payer: Medicare PPO | Admitting: Physical Therapy

## 2021-08-04 ENCOUNTER — Encounter (HOSPITAL_BASED_OUTPATIENT_CLINIC_OR_DEPARTMENT_OTHER): Payer: Self-pay | Admitting: Physical Therapy

## 2021-08-04 ENCOUNTER — Other Ambulatory Visit: Payer: Self-pay

## 2021-08-04 DIAGNOSIS — R2689 Other abnormalities of gait and mobility: Secondary | ICD-10-CM | POA: Diagnosis not present

## 2021-08-04 DIAGNOSIS — M24572 Contracture, left ankle: Secondary | ICD-10-CM | POA: Diagnosis not present

## 2021-08-04 DIAGNOSIS — M25561 Pain in right knee: Secondary | ICD-10-CM | POA: Diagnosis not present

## 2021-08-04 DIAGNOSIS — R2681 Unsteadiness on feet: Secondary | ICD-10-CM | POA: Diagnosis not present

## 2021-08-04 DIAGNOSIS — M6281 Muscle weakness (generalized): Secondary | ICD-10-CM

## 2021-08-04 DIAGNOSIS — R262 Difficulty in walking, not elsewhere classified: Secondary | ICD-10-CM

## 2021-08-04 DIAGNOSIS — G8929 Other chronic pain: Secondary | ICD-10-CM

## 2021-08-04 NOTE — Therapy (Signed)
OUTPATIENT PHYSICAL THERAPY TREATMENT NOTE   Patient Name: Kristin DUNNIGAN, PhD MRN: [id number] DOB:Oct 05, 1940, 81 y.o., female Today's Date: 08/05/2021  PCP: Donnajean Lopes, MD REFERRING PROVIDER: Donnajean Lopes, MD   PT End of Session - 08/04/21 1515     Visit Number 2    Number of Visits 16    Date for PT Re-Evaluation 09/24/21    Authorization Time Period 8 weeks    Progress Note Due on Visit 10    PT Start Time 1515    PT Stop Time 1603    PT Time Calculation (min) 48 min    Equipment Utilized During Treatment Gait belt    Activity Tolerance Patient tolerated treatment well    Behavior During Therapy WFL for tasks assessed/performed             Past Medical History:  Diagnosis Date   Carpal tunnel syndrome    Left Hand   Chest pain    CHF (congestive heart failure) ([place])    Dilated cardiomyopathy. Her original ejection fraction was 10-20%. Her ejection fraction has improved to 57% by 2009   Coronary artery disease    Depression    Dyslipidemia    Foot drop, left    GERD (gastroesophageal reflux disease)    Headache(784.0)    Hemorrhoids    History of anxiety    Hypertension    Lower back pain    Myocardial infarction ([place])    Obesity    Osteoarthritis    Severe with collapse of the left hip   Single functional kidney    single right kidney,  left kidney is atrophic   Past Surgical History:  Procedure Laterality Date   BREAST BIOPSY Right    x2   CARDIAC CATHETERIZATION     Ejection Fraction was around 45-50%   HYSTEROSCOPY  09/03/2011   Procedure: HYSTEROSCOPY;  Surgeon: Logan Bores, MD;  Location: [facility];  Service: Gynecology;  Laterality: N/A;  With Resection of polyp with truclear system   REPLACEMENT TOTAL HIP W/  RESURFACING IMPLANTS  2002   left   TUBAL LIGATION     Patient Active Problem List   Diagnosis Date Noted   Pain in right knee 10/12/2019   Complete tear of right rotator cuff 06/18/2016   Localized, primary  osteoarthritis of shoulder region, right 06/18/2016   Coronary artery disease involving native coronary artery of native heart without angina pectoris 05/27/2016   Height loss 01/09/2016   History of total left hip arthroplasty 01/09/2016   Left foot drop 01/09/2016   Postmenopausal status 01/09/2016   Hypokalemia 03/30/2013   Abscess of axilla, right 08/31/2011   Cough 07/29/2011   Sinusitis 04/88/8916   Chronic systolic CHF (congestive heart failure) ([place]) 03/11/2011   Hyperlipidemia 03/11/2011   Carpal tunnel syndrome    GERD (gastroesophageal reflux disease)    Other specified abnormal findings of blood chemistry 11/22/2009   Vitamin D deficiency 11/22/2009   Rash and other nonspecific skin eruption 11/14/2009   Atrophic vaginitis 11/12/2009   Depressive disorder, not elsewhere classified 11/12/2009   Onychomycosis due to dermatophyte 11/12/2009   Arthropathy 11/07/2009   Congestive heart failure ([place]) 11/07/2009   Low back pain 11/07/2009   Other acquired deformity of ankle and foot(736.79) 11/07/2009   Anxiety disorder 09/20/2009   Cardiomyopathy ([place]) 09/20/2009   Essential hypertension 09/20/2009   Nocturia 09/20/2009    REFERRING DIAG:  M25.561,G89.29 (ICD-10-CM) - Chronic pain of right knee  M24.572 (ICD-10-CM) -  Ankle contracture, left    THERAPY DIAG:  Chronic pain of right knee  Difficulty in walking, not elsewhere classified  Muscle weakness (generalized)  Other abnormalities of gait and mobility  Unsteadiness on feet  PERTINENT HISTORY: Right total hip replacement performed in 2003 Chronic left foot drop of 20 years duration with Left AFO  Hallux valgus right  and left side as well as some rotation of her toe she is wearing bearing weight on the lateral border of her great toe on the left side.  Right knee pain.  Possible osteoarthritis plus minus meniscal degeneration.  PRECAUTIONS: Falls Right foot drop with AFO  SUBJECTIVE: Rt knee is hurting,  took a tylenol.   PAIN:  Are you having pain? Yes NPRS scale: 5/10 right knee PAIN TYPE: aching Pain description: aching  Aggravating factors: standing/moving Relieving factors: sitting   DIAGNOSTIC FINDINGS: no diagnostics   PATIENT SURVEYS:  FOTO 33   COGNITION:          Overall cognitive status: Within functional limits for tasks assessed                        SENSATION:          Light touch: Deficits :decreased sensation (light touch) right distal foot     POSTURE:  Slightly kyphotic   PALPATION: Tenderness prepatellar R   LE AROM/PROM:   A/PROM Right 07/30/2021 Left 07/30/2021  Hip flexion      Hip extension      Hip abduction      Hip adduction      Hip internal rotation      Hip external rotation      Knee flexion 110 wfl  Knee extension -25 wfl  Ankle dorsiflexion wfl 5  Ankle plantarflexion wfl wfl  Ankle inversion      Ankle eversion       (Blank rows = not tested)   LE MMT:   MMT Right 07/30/2021 Left 07/30/2021  Hip flexion 4 4                                     Knee flexion 3+ 5  Knee extension 3+ 5  Ankle dorsiflexion   2  Ankle plantarflexion   4                 (Blank rows = not tested)     FUNCTIONAL TESTS:  5 times sit to stand: unable to complete limited to pain Timed up and go (TUG): 18 sec   GAIT: Distance walked: 500 ft Assistive device utilized: Single point cane Level of assistance: Modified independence Comments: Antalgic gait RLE due to decreased and Painful ROM right knee       TODAY'S TREATMENT: 2/13: Supine quad set & added SLR Sidelying hip abd Discussed aquatic exercises   Evaluation; HEP       PATIENT EDUCATION:  Education details: exercise form/rationale, AFO, trekking poles, night splint Person educated: Patient Education method: Explanation and demonstration Education comprehension: returned demonstration and verbal     HOME EXERCISE PROGRAM: Access Code: [email or web address] URL:    [email or web address]   ASSESSMENT:   CLINICAL IMPRESSION: Discussed anatomy of condition as exercises were performed and discussion of aquatic exercises added to HEP printout. Difficulty with coordinated contraction of quads improved with instruction. Tightness in foot at night likely due to foot drop and we discussed  using a night splint to maintain a DF stretch to avoid cramping. We also discussed the use of trekking poles for upright posture and 4-point contact to improve stability. Current tightness in leg is causing foot to press out of her shoe and AFO- requested a different AFO from vendor to look at.   Objective impairments include Abnormal gait; Decreased activity tolerance; Decreased range of motion; Decreased strength; Decreased balance; Decreased mobility; Difficulty walking; Increased edema; Impaired sensation; Pain. These impairments are limiting patient from  Bathing; Locomotion Level; Continence; Squat; Stairs; Stand; Transfers . Personal factors including  Age; Comorbidity 2; Comorbidity 1  are also affecting patient's functional outcome. Patient will benefit from skilled PT to address above impairments and improve overall function.   REHAB POTENTIAL: Good   CLINICAL DECISION MAKING: Stable/uncomplicated   EVALUATION COMPLEXITY: Low     GOALS: Goals reviewed with patient? no   SHORT TERM GOALS:   STG Name Target Date Goal status  1 Pt will improve on FOTO from 33 to 39 progressing pt from a community Ambulator toward an advanced Ambulator Baseline: 33 08/20/2021 INITIAL  2 Pt will be independent and compliant with HEP to decrease pain and improve ROM Baseline:  08/20/2021 INITIAL  3 Right knee extension will improve from -25 to -20 degrees to improve heel strike with ambulation Baseline: 08/20/2021 INITIAL  4 Right knee pain will decrease to <or= 5/10 for improved mobility Baseline: 08/20/2021 INITIAL                               LONG TERM GOALS:     LTG Name Target Date Goal status  1 Pt will improve on FOTO from 33 to 43 progressing pt from a community Ambulator to an advanced Ambulator Baseline: 09/24/2021 INITIAL  2 Right knee extension will improve to < -20d Baseline: 09/24/2021 INITIAL  3 Pt will improve TUG to < 14 seconds to demonstrate improved strength and ROM. Baseline:18 09/24/2021 INITIAL  4 Pt will improve in right knee flex and extension strength from 3+/5 to >or= 4+/5 Baseline: 09/24/2021 INITIAL                               PLAN: PT FREQUENCY: 1-2x/week   PT DURATION: 8 weeks   PLANNED INTERVENTIONS: Therapeutic exercises, Therapeutic activity, Neuro Muscular re-education, Balance training, Gait training, Patient/Family education, Joint mobilization, Aquatic Therapy, Cryotherapy, Moist heat, and Taping   PLAN FOR NEXT SESSION: Aquatics for quad strengthening- when wound on foot heals; balance and improved gait    C.  PT, DPT 08/05/21 11:30 AM

## 2021-08-05 ENCOUNTER — Ambulatory Visit (HOSPITAL_BASED_OUTPATIENT_CLINIC_OR_DEPARTMENT_OTHER): Payer: Medicare PPO | Admitting: Physical Therapy

## 2021-08-05 DIAGNOSIS — H33313 Horseshoe tear of retina without detachment, bilateral: Secondary | ICD-10-CM | POA: Diagnosis not present

## 2021-08-05 DIAGNOSIS — H401111 Primary open-angle glaucoma, right eye, mild stage: Secondary | ICD-10-CM | POA: Diagnosis not present

## 2021-08-05 DIAGNOSIS — H401123 Primary open-angle glaucoma, left eye, severe stage: Secondary | ICD-10-CM | POA: Diagnosis not present

## 2021-08-05 DIAGNOSIS — H35343 Macular cyst, hole, or pseudohole, bilateral: Secondary | ICD-10-CM | POA: Diagnosis not present

## 2021-08-05 DIAGNOSIS — H35373 Puckering of macula, bilateral: Secondary | ICD-10-CM | POA: Diagnosis not present

## 2021-08-05 DIAGNOSIS — H31093 Other chorioretinal scars, bilateral: Secondary | ICD-10-CM | POA: Diagnosis not present

## 2021-08-05 DIAGNOSIS — Z961 Presence of intraocular lens: Secondary | ICD-10-CM | POA: Diagnosis not present

## 2021-08-06 ENCOUNTER — Other Ambulatory Visit: Payer: Self-pay

## 2021-08-06 ENCOUNTER — Ambulatory Visit: Payer: Medicare PPO | Admitting: Podiatry

## 2021-08-06 ENCOUNTER — Encounter: Payer: Self-pay | Admitting: Podiatry

## 2021-08-06 DIAGNOSIS — M79676 Pain in unspecified toe(s): Secondary | ICD-10-CM | POA: Diagnosis not present

## 2021-08-06 DIAGNOSIS — F429 Obsessive-compulsive disorder, unspecified: Secondary | ICD-10-CM | POA: Diagnosis not present

## 2021-08-06 DIAGNOSIS — L84 Corns and callosities: Secondary | ICD-10-CM | POA: Diagnosis not present

## 2021-08-06 DIAGNOSIS — M179 Osteoarthritis of knee, unspecified: Secondary | ICD-10-CM | POA: Insufficient documentation

## 2021-08-06 DIAGNOSIS — H5340 Unspecified visual field defects: Secondary | ICD-10-CM | POA: Diagnosis not present

## 2021-08-06 DIAGNOSIS — R2681 Unsteadiness on feet: Secondary | ICD-10-CM | POA: Insufficient documentation

## 2021-08-06 DIAGNOSIS — B351 Tinea unguium: Secondary | ICD-10-CM | POA: Diagnosis not present

## 2021-08-06 NOTE — Progress Notes (Signed)
°  Subjective:  Patient ID: Kristin Ina, PhD, female    DOB: Nov 19, 1940,   MRN: [id number]  Chief Complaint  Patient presents with   Nail Problem    Trim nails    Callouses    I have a spot on the left big toe and I use dried balm and I do soak in epsom salt and I go to swim class three times a week and this has been going on for about 78 years     81 y.o. female presents for concern of a left foot callus that has turned into a wound. She has seen Dr. Milinda Pointer in the past for this and was told to do epsom salt soaks and dry dressing to the area recently. She has had this area for more than 10 years. Relates foot drop on that side. She uses a toe guard which helps. She does swim three times a week. She has been soaking and using mupirocin.  . Denies any other pedal complaints. Denies n/v/f/c.   Past Medical History:  Diagnosis Date   Carpal tunnel syndrome    Left Hand   Chest pain    CHF (congestive heart failure) ([facility])    Dilated cardiomyopathy. Her original ejection fraction was 10-20%. Her ejection fraction has improved to 57% by 2009   Coronary artery disease    Depression    Dyslipidemia    Foot drop, left    GERD (gastroesophageal reflux disease)    Headache(784.0)    Hemorrhoids    History of anxiety    Hypertension    Lower back pain    Myocardial infarction ([place])    Obesity    Osteoarthritis    Severe with collapse of the left hip   Single functional kidney    single right kidney,  left kidney is atrophic    Objective:  Physical Exam: Vascular: DP/PT pulses 2/4 bilateral. CFT <3 seconds. Normal hair growth on digits. No edema.  Skin. No lacerations or abrasions bilateral feet. Hyperkeratotic lesion to left hallux with no underlying wound noted today but very close. No surrounding erythema or edema.  Musculoskeletal: MMT 5/5 bilateral lower extremities in DF, PF, Inversion and Eversion. Deceased ROM in DF of ankle joint.  Neurological: Sensation intact to light  touch. HAV deformity noted bilateral with bilateral hammered digits.   Assessment:   1. Pre-ulcerative calluses   2. Pain due to onychomycosis of toenail      Plan:  Patient was evaluated and treated and all questions answered. -Discussed and educated patient on  foot care, especially with  regards to the vascular, neurological and musculoskeletal systems.  -Discussed supportive shoes at all times and checking feet regularly.  -Mechanically debrided all nails 1-5 bilateral using sterile nail nipper and filed with dremel without incident  -Hyperkeratotic lesion debrided, no underlying ulcer today. Appears healed.  Discussed care for this area. Will have her follow-up with Aaron Edelman to look ar orthotics to offload area on left hallux.  -Answered all patient questions -Patient to return  in 3 months for at risk foot care -Patient advised to call the office if any problems or questions arise in the meantime.  Return in about 3 months (around 11/03/2021) for rfc.   Lorenda Peck, DPM

## 2021-08-07 ENCOUNTER — Ambulatory Visit: Payer: Medicare PPO | Admitting: Podiatry

## 2021-08-13 ENCOUNTER — Ambulatory Visit (HOSPITAL_BASED_OUTPATIENT_CLINIC_OR_DEPARTMENT_OTHER): Payer: Medicare PPO | Admitting: Physical Therapy

## 2021-08-13 ENCOUNTER — Encounter (HOSPITAL_BASED_OUTPATIENT_CLINIC_OR_DEPARTMENT_OTHER): Payer: Self-pay | Admitting: Physical Therapy

## 2021-08-13 ENCOUNTER — Other Ambulatory Visit: Payer: Self-pay

## 2021-08-13 DIAGNOSIS — M24572 Contracture, left ankle: Secondary | ICD-10-CM | POA: Diagnosis not present

## 2021-08-13 DIAGNOSIS — M25561 Pain in right knee: Secondary | ICD-10-CM | POA: Diagnosis not present

## 2021-08-13 DIAGNOSIS — M6281 Muscle weakness (generalized): Secondary | ICD-10-CM

## 2021-08-13 DIAGNOSIS — G8929 Other chronic pain: Secondary | ICD-10-CM

## 2021-08-13 DIAGNOSIS — R2681 Unsteadiness on feet: Secondary | ICD-10-CM

## 2021-08-13 DIAGNOSIS — R2689 Other abnormalities of gait and mobility: Secondary | ICD-10-CM | POA: Diagnosis not present

## 2021-08-13 DIAGNOSIS — R262 Difficulty in walking, not elsewhere classified: Secondary | ICD-10-CM

## 2021-08-13 NOTE — Therapy (Signed)
OUTPATIENT PHYSICAL THERAPY TREATMENT NOTE   Patient Name: Kristin DIGNAN, PhD MRN: [id number] DOB:1940/10/28, 81 y.o., female Today's Date: 08/13/2021  PCP: Donnajean Lopes, MD REFERRING PROVIDER: Charlett Blake, MD   PT End of Session - 08/13/21 [phone]     Visit Number 3    Number of Visits 16    Date for PT Re-Evaluation 09/24/21    Authorization Time Period 8 weeks    Progress Note Due on Visit 10    PT Start Time 0845    PT Stop Time 0930    PT Time Calculation (min) 45 min    Activity Tolerance Patient tolerated treatment well    Behavior During Therapy [facility] for tasks assessed/performed             Past Medical History:  Diagnosis Date   Carpal tunnel syndrome    Left Hand   Chest pain    CHF (congestive heart failure) ([place])    Dilated cardiomyopathy. Her original ejection fraction was 10-20%. Her ejection fraction has improved to 57% by 2009   Coronary artery disease    Depression    Dyslipidemia    Foot drop, left    GERD (gastroesophageal reflux disease)    Headache(784.0)    Hemorrhoids    History of anxiety    Hypertension    Lower back pain    Myocardial infarction ([place])    Obesity    Osteoarthritis    Severe with collapse of the left hip   Single functional kidney    single right kidney,  left kidney is atrophic   Past Surgical History:  Procedure Laterality Date   BREAST BIOPSY Right    x2   CARDIAC CATHETERIZATION     Ejection Fraction was around 45-50%   HYSTEROSCOPY  09/03/2011   Procedure: HYSTEROSCOPY;  Surgeon: Logan Bores, MD;  Location: [facility];  Service: Gynecology;  Laterality: N/A;  With Resection of polyp with truclear system   REPLACEMENT TOTAL HIP W/  RESURFACING IMPLANTS  2002   left   TUBAL LIGATION     Patient Active Problem List   Diagnosis Date Noted   Osteoarthritis of knee 08/06/2021   Unsteady gait 08/06/2021   At high risk for falls 07/24/2021   Low bone mass 07/24/2021   Pain in right knee  10/12/2019   Non-pressure chronic ulcer of other part of left foot with fat layer exposed ([place]) 11/03/2017   Encounter for general adult medical examination without abnormal findings 05/11/2017   Acute bronchitis 05/05/2017   Injury 08/21/2016   Complete tear of right rotator cuff 06/18/2016   Localized, primary osteoarthritis of shoulder region, right 06/18/2016   Coronary artery disease involving native coronary artery of native heart without angina pectoris 05/27/2016   Neck pain 02/19/2016   Other slipping, tripping and stumbling without falling, initial encounter 02/19/2016   Pain in thoracic spine 02/19/2016   Posttraumatic headache 02/19/2016   Height loss 01/09/2016   History of total left hip arthroplasty 01/09/2016   Left foot drop 01/09/2016   Postmenopausal status 01/09/2016   Hypokalemia 03/30/2013   Abscess of axilla, right 08/31/2011   Cough 07/29/2011   Sinusitis 07/29/2011   Acquired hallux valgus 05/11/2011   Type 2 diabetes mellitus with foot ulcer (CODE) ([place]) 05/11/2011   Microscopic hematuria 03/18/2011   Psychophysiologic insomnia 67/20/9470   Chronic systolic CHF (congestive heart failure) ([place]) 03/11/2011   Hyperlipidemia 03/11/2011   Carpal tunnel syndrome    GERD (gastroesophageal  reflux disease)    Other specified abnormal findings of blood chemistry 11/22/2009   Vitamin D deficiency 11/22/2009   Rash and other nonspecific skin eruption 11/14/2009   Atrophic vaginitis 11/12/2009   Depressive disorder, not elsewhere classified 11/12/2009   Onychomycosis due to dermatophyte 11/12/2009   Arthropathy 11/07/2009   Congestive heart failure ([place]) 11/07/2009   Low back pain 11/07/2009   Other acquired deformity of ankle and foot(736.79) 11/07/2009   Anxiety disorder 09/20/2009   Cardiomyopathy ([place]) 09/20/2009   Essential hypertension 09/20/2009   Nocturia 09/20/2009   Abnormal weight gain 09/20/2009   Pain in left hip 09/20/2009    REFERRING DIAG:   M25.561,G89.29 (ICD-10-CM) - Chronic pain of right knee  M24.572 (ICD-10-CM) - Ankle contracture, left    THERAPY DIAG:  Chronic pain of right knee  Difficulty in walking, not elsewhere classified  Muscle weakness (generalized)  Other abnormalities of gait and mobility  Unsteadiness on feet  PERTINENT HISTORY: Right total hip replacement performed in 2003 Chronic left foot drop of 20 years duration with Left AFO  Hallux valgus right  and left side as well as some rotation of her toe she is wearing bearing weight on the lateral border of her great toe on the left side.  Right knee pain.  Possible osteoarthritis plus minus meniscal degeneration.  PRECAUTIONS: Falls Right foot drop with AFO  SUBJECTIVE: Rt knee is hurting, took a tylenol.   PAIN:  Are you having pain? Yes NPRS scale: 5/10 right knee PAIN TYPE: aching Pain description: aching  Aggravating factors: standing/moving Relieving factors: sitting   DIAGNOSTIC FINDINGS: no diagnostics   PATIENT SURVEYS:  FOTO 33   COGNITION:          Overall cognitive status: Within functional limits for tasks assessed                        SENSATION:          Light touch: Deficits :decreased sensation (light touch) right distal foot     POSTURE:  Slightly kyphotic   PALPATION: Tenderness prepatellar R   LE AROM/PROM:   A/PROM Right 07/30/2021 Left 07/30/2021  Hip flexion      Hip extension      Hip abduction      Hip adduction      Hip internal rotation      Hip external rotation      Knee flexion 110 wfl  Knee extension -25 wfl  Ankle dorsiflexion wfl 5  Ankle plantarflexion wfl wfl  Ankle inversion      Ankle eversion       (Blank rows = not tested)   LE MMT:   MMT Right 07/30/2021 Left 07/30/2021  Hip flexion 4 4                                     Knee flexion 3+ 5  Knee extension 3+ 5  Ankle dorsiflexion   2  Ankle plantarflexion   4                 (Blank rows = not tested)      FUNCTIONAL TESTS:  5 times sit to stand: unable to complete limited to pain Timed up and go (TUG): 18 sec   GAIT: Distance walked: 500 ft Assistive device utilized: Single point cane Level of assistance: Modified independence Comments: Antalgic gait RLE  due to decreased and Painful ROM right knee       TODAY'S TREATMENT: 2/22:  Orthotist spent time trailing AFO  Gastroc stretches: seated with strap & standing lunge- in new AFO  Gait with AFO- PT and vendor educating & instructing  2/13: Supine quad set & added SLR Sidelying hip abd Discussed aquatic exercises   Evaluation; HEP       PATIENT EDUCATION:  Education details: AFO Person educated: Patient Education method: Explanation and demonstration Education comprehension: returned demonstration and verbal     HOME EXERCISE PROGRAM: Access Code: [email or web address] URL:   [email or web address]   ASSESSMENT:   CLINICAL IMPRESSION: [employer] vendor came to trial the X-tern classic AFO today. This brace allowed her to perform a dorsiflexion motion to allow for hip extension through stance phase and toe off in gait resulting in a decreased limp and more upright posture. Will continue to benefit from postural training but she was fit with the AFO and allowed to take home for a trial. Insurance is an issue as she got a new one last spring and [facility] only covers new ones every 5  years. Asked her to pause the use of silicone padding under her toe and wrap with gauze, rather than using adhesive, to reduce skin breakdown and promote healing. Discussed possibly considering wound care if it does not start to improve soon.   Objective impairments include Abnormal gait; Decreased activity tolerance; Decreased range of motion; Decreased strength; Decreased balance; Decreased mobility; Difficulty walking; Increased edema; Impaired sensation; Pain. These impairments are limiting patient from  Bathing; Locomotion Level; Continence;  Squat; Stairs; Stand; Transfers . Personal factors including  Age; Comorbidity 2; Comorbidity 1  are also affecting patient's functional outcome. Patient will benefit from skilled PT to address above impairments and improve overall function.   REHAB POTENTIAL: Good   CLINICAL DECISION MAKING: Stable/uncomplicated   EVALUATION COMPLEXITY: Low     GOALS: Goals reviewed with patient? no   SHORT TERM GOALS:   STG Name Target Date Goal status  1 Pt will improve on FOTO from 33 to 39 progressing pt from a community Ambulator toward an advanced Ambulator Baseline: 33 08/20/2021 INITIAL  2 Pt will be independent and compliant with HEP to decrease pain and improve ROM Baseline:  08/20/2021 INITIAL  3 Right knee extension will improve from -25 to -20 degrees to improve heel strike with ambulation Baseline: 08/20/2021 INITIAL  4 Right knee pain will decrease to <or= 5/10 for improved mobility Baseline: 08/20/2021 INITIAL                               LONG TERM GOALS:    LTG Name Target Date Goal status  1 Pt will improve on FOTO from 33 to 43 progressing pt from a community Ambulator to an advanced Ambulator Baseline: 09/24/2021 INITIAL  2 Right knee extension will improve to < -20d Baseline: 09/24/2021 INITIAL  3 Pt will improve TUG to < 14 seconds to demonstrate improved strength and ROM. Baseline:18 09/24/2021 INITIAL  4 Pt will improve in right knee flex and extension strength from 3+/5 to >or= 4+/5 Baseline: 09/24/2021 INITIAL                               PLAN: PT FREQUENCY: 1-2x/week   PT DURATION: 8 weeks   PLANNED INTERVENTIONS: Therapeutic exercises, Therapeutic activity, Neuro Muscular re-education, Balance  training, Gait training, Patient/Family education, Joint mobilization, Aquatic Therapy, Cryotherapy, Moist heat, and Taping   PLAN FOR NEXT SESSION: outcome of AFO? Posture, gym equipment   [place].  PT, DPT 08/13/21 12:27 PM

## 2021-08-14 ENCOUNTER — Ambulatory Visit: Payer: Medicare PPO | Admitting: Podiatry

## 2021-08-19 ENCOUNTER — Other Ambulatory Visit: Payer: Self-pay

## 2021-08-19 ENCOUNTER — Ambulatory Visit (HOSPITAL_BASED_OUTPATIENT_CLINIC_OR_DEPARTMENT_OTHER): Payer: Medicare PPO | Admitting: Physical Therapy

## 2021-08-19 ENCOUNTER — Encounter (HOSPITAL_BASED_OUTPATIENT_CLINIC_OR_DEPARTMENT_OTHER): Payer: Self-pay | Admitting: Physical Therapy

## 2021-08-19 DIAGNOSIS — G8929 Other chronic pain: Secondary | ICD-10-CM

## 2021-08-19 DIAGNOSIS — M25561 Pain in right knee: Secondary | ICD-10-CM | POA: Diagnosis not present

## 2021-08-19 DIAGNOSIS — M24572 Contracture, left ankle: Secondary | ICD-10-CM | POA: Diagnosis not present

## 2021-08-19 DIAGNOSIS — R2689 Other abnormalities of gait and mobility: Secondary | ICD-10-CM | POA: Diagnosis not present

## 2021-08-19 DIAGNOSIS — R262 Difficulty in walking, not elsewhere classified: Secondary | ICD-10-CM

## 2021-08-19 DIAGNOSIS — M6281 Muscle weakness (generalized): Secondary | ICD-10-CM | POA: Diagnosis not present

## 2021-08-19 DIAGNOSIS — R2681 Unsteadiness on feet: Secondary | ICD-10-CM | POA: Diagnosis not present

## 2021-08-19 NOTE — Therapy (Signed)
OUTPATIENT PHYSICAL THERAPY TREATMENT NOTE   Patient Name: Kristin MACALUSO, PhD MRN: [id number] DOB:04-05-1941, 81 y.o., female Today's Date: 08/19/2021  PCP: Donnajean Lopes, MD REFERRING PROVIDER: Donnajean Lopes, MD   PT End of Session - 08/19/21 1440     Visit Number 4    Number of Visits 16    Date for PT Re-Evaluation 09/24/21    Authorization Time Period 8 weeks    Progress Note Due on Visit 10    PT Start Time 1436    PT Stop Time 1517    PT Time Calculation (min) 41 min    Activity Tolerance Patient tolerated treatment well    Behavior During Therapy WFL for tasks assessed/performed             Past Medical History:  Diagnosis Date   Carpal tunnel syndrome    Left Hand   Chest pain    CHF (congestive heart failure) ([place])    Dilated cardiomyopathy. Her original ejection fraction was 10-20%. Her ejection fraction has improved to 57% by 2009   Coronary artery disease    Depression    Dyslipidemia    Foot drop, left    GERD (gastroesophageal reflux disease)    Headache(784.0)    Hemorrhoids    History of anxiety    Hypertension    Lower back pain    Myocardial infarction ([place])    Obesity    Osteoarthritis    Severe with collapse of the left hip   Single functional kidney    single right kidney,  left kidney is atrophic   Past Surgical History:  Procedure Laterality Date   BREAST BIOPSY Right    x2   CARDIAC CATHETERIZATION     Ejection Fraction was around 45-50%   HYSTEROSCOPY  09/03/2011   Procedure: HYSTEROSCOPY;  Surgeon: Logan Bores, MD;  Location: [facility];  Service: Gynecology;  Laterality: N/A;  With Resection of polyp with truclear system   REPLACEMENT TOTAL HIP W/  RESURFACING IMPLANTS  2002   left   TUBAL LIGATION     Patient Active Problem List   Diagnosis Date Noted   Osteoarthritis of knee 08/06/2021   Unsteady gait 08/06/2021   At high risk for falls 07/24/2021   Low bone mass 07/24/2021   Pain in right knee  10/12/2019   Non-pressure chronic ulcer of other part of left foot with fat layer exposed ([place]) 11/03/2017   Encounter for general adult medical examination without abnormal findings 05/11/2017   Acute bronchitis 05/05/2017   Injury 08/21/2016   Complete tear of right rotator cuff 06/18/2016   Localized, primary osteoarthritis of shoulder region, right 06/18/2016   Coronary artery disease involving native coronary artery of native heart without angina pectoris 05/27/2016   Neck pain 02/19/2016   Other slipping, tripping and stumbling without falling, initial encounter 02/19/2016   Pain in thoracic spine 02/19/2016   Posttraumatic headache 02/19/2016   Height loss 01/09/2016   History of total left hip arthroplasty 01/09/2016   Left foot drop 01/09/2016   Postmenopausal status 01/09/2016   Hypokalemia 03/30/2013   Abscess of axilla, right 08/31/2011   Cough 07/29/2011   Sinusitis 07/29/2011   Acquired hallux valgus 05/11/2011   Type 2 diabetes mellitus with foot ulcer (CODE) ([place]) 05/11/2011   Microscopic hematuria 03/18/2011   Psychophysiologic insomnia 16/38/4665   Chronic systolic CHF (congestive heart failure) ([place]) 03/11/2011   Hyperlipidemia 03/11/2011   Carpal tunnel syndrome    GERD (gastroesophageal  reflux disease)    Other specified abnormal findings of blood chemistry 11/22/2009   Vitamin D deficiency 11/22/2009   Rash and other nonspecific skin eruption 11/14/2009   Atrophic vaginitis 11/12/2009   Depressive disorder, not elsewhere classified 11/12/2009   Onychomycosis due to dermatophyte 11/12/2009   Arthropathy 11/07/2009   Congestive heart failure ([place]) 11/07/2009   Low back pain 11/07/2009   Other acquired deformity of ankle and foot(736.79) 11/07/2009   Anxiety disorder 09/20/2009   Cardiomyopathy ([place]) 09/20/2009   Essential hypertension 09/20/2009   Nocturia 09/20/2009   Abnormal weight gain 09/20/2009   Pain in left hip 09/20/2009    REFERRING DIAG:   M25.561,G89.29 (ICD-10-CM) - Chronic pain of right knee  M24.572 (ICD-10-CM) - Ankle contracture, left    THERAPY DIAG:  Chronic pain of right knee  Difficulty in walking, not elsewhere classified  Muscle weakness (generalized)  PERTINENT HISTORY: Right total hip replacement performed in 2003 Chronic left foot drop of 20 years duration with Left AFO  Hallux valgus right  and left side as well as some rotation of her toe she is wearing bearing weight on the lateral border of her great toe on the left side.  Right knee pain.  Possible osteoarthritis plus minus meniscal degeneration.  PRECAUTIONS: Falls Right foot drop with AFO  SUBJECTIVE: I really like the AFO- I feel like I can do heel toe better. My knee is not as bad.   PAIN:  Are you having pain? Yes NPRS scale: 3-4/10 right knee PAIN TYPE: aching Pain description: aching  Aggravating factors: standing/moving Relieving factors: sitting   DIAGNOSTIC FINDINGS: no diagnostics   PATIENT SURVEYS:  FOTO 33   COGNITION:          Overall cognitive status: Within functional limits for tasks assessed                        SENSATION:          Light touch: Deficits :decreased sensation (light touch) right distal foot     POSTURE:  Slightly kyphotic   PALPATION: Tenderness prepatellar R   LE AROM/PROM:   A/PROM Right 07/30/2021 Left 07/30/2021  Hip flexion      Hip extension      Hip abduction      Hip adduction      Hip internal rotation      Hip external rotation      Knee flexion 110 wfl  Knee extension -25 wfl  Ankle dorsiflexion wfl 5  Ankle plantarflexion wfl wfl  Ankle inversion      Ankle eversion       (Blank rows = not tested)   LE MMT:   MMT Right 07/30/2021 Left 07/30/2021 Right 2/28  Hip flexion 4 4                                           Knee flexion 3+ 5 4+  Knee extension 3+ 5 4+  Ankle dorsiflexion   2   Ankle plantarflexion   4                    (Blank rows = not tested)      FUNCTIONAL TESTS:  5 times sit to stand: unable to complete limited to pain; 2/28: 23s (wearing new AFO) Timed up and go (TUG): 18 sec  GAIT: Distance walked: 500 ft Assistive device utilized: Single point cane Level of assistance: Modified independence Comments: Antalgic gait RLE due to decreased and Painful ROM right knee       TODAY'S TREATMENT: 2/28:  5TSTS test  LAQ with ball in adduction squeeze  SLS with quad contraction  Wide tandem weight shift with glut + quad set  Mini wall sit- practice for pool exercise  Nu step 5 min L5  2/22:  Orthotist spent time trailing AFO  Gastroc stretches: seated with strap & standing lunge- in new AFO  Gait with AFO- PT and vendor educating & instructing  2/13: Supine quad set & added SLR Sidelying hip abd Discussed aquatic exercises   Evaluation; HEP       PATIENT EDUCATION:  Education details: AFO Person educated: Patient Education method: Explanation and demonstration Education comprehension: returned demonstration and verbal     HOME EXERCISE PROGRAM: Access Code: [email or web address] URL:   [email or web address]   ASSESSMENT:   CLINICAL IMPRESSION: Pt ambulates with much improved posture and confidence with new AFO. Overall she feels like her strength is improving and was surprised that she was able to complete a 5TSTS without UE support. I would like to continue to progress her to where she can navigate a single curb safely and confidently without UE support.   Objective impairments include Abnormal gait; Decreased activity tolerance; Decreased range of motion; Decreased strength; Decreased balance; Decreased mobility; Difficulty walking; Increased edema; Impaired sensation; Pain. These impairments are limiting patient from  Bathing; Locomotion Level; Continence; Squat; Stairs; Stand; Transfers . Personal factors including  Age; Comorbidity 2; Comorbidity 1  are also affecting patient's functional outcome. Patient  will benefit from skilled PT to address above impairments and improve overall function.   REHAB POTENTIAL: Good   CLINICAL DECISION MAKING: Stable/uncomplicated   EVALUATION COMPLEXITY: Low     GOALS: Goals reviewed with patient? no   SHORT TERM GOALS:   STG Name Target Date Goal status  1 Pt will improve on FOTO from 33 to 39 progressing pt from a community Ambulator toward an advanced Ambulator Baseline: 33 08/20/2021 INITIAL  2 Pt will be independent and compliant with HEP to decrease pain and improve ROM Baseline:  08/20/2021 INITIAL  3 Right knee extension will improve from -25 to -20 degrees to improve heel strike with ambulation Baseline: 08/20/2021 INITIAL  4 Right knee pain will decrease to <or= 5/10 for improved mobility Baseline: 08/20/2021 INITIAL                               LONG TERM GOALS:    LTG Name Target Date Goal status  1 Pt will improve on FOTO from 33 to 43 progressing pt from a community Ambulator to an advanced Ambulator Baseline: 09/24/2021 INITIAL  2 Right knee extension will improve to < -20d Baseline: 09/24/2021 INITIAL  3 Pt will improve TUG to < 14 seconds to demonstrate improved strength and ROM. Baseline:18 09/24/2021 INITIAL  4 Pt will improve in right knee flex and extension strength from 3+/5 to >or= 4+/5 Baseline: 09/24/2021 INITIAL                               PLAN: PT FREQUENCY: 1-2x/week   PT DURATION: 8 weeks   PLANNED INTERVENTIONS: Therapeutic exercises, Therapeutic activity, Neuro Muscular re-education, Balance training, Gait training, Patient/Family education, Joint mobilization, Aquatic Therapy,  Cryotherapy, Moist heat, and Taping   PLAN FOR NEXT SESSION: review STGs, gym equipment    C.  PT, DPT 08/19/21 4:27 PM

## 2021-08-20 DIAGNOSIS — Z9181 History of falling: Secondary | ICD-10-CM | POA: Diagnosis not present

## 2021-08-20 DIAGNOSIS — M810 Age-related osteoporosis without current pathological fracture: Secondary | ICD-10-CM | POA: Diagnosis not present

## 2021-08-20 DIAGNOSIS — Z8781 Personal history of (healed) traumatic fracture: Secondary | ICD-10-CM | POA: Diagnosis not present

## 2021-08-20 DIAGNOSIS — F429 Obsessive-compulsive disorder, unspecified: Secondary | ICD-10-CM | POA: Diagnosis not present

## 2021-08-20 DIAGNOSIS — K219 Gastro-esophageal reflux disease without esophagitis: Secondary | ICD-10-CM | POA: Diagnosis not present

## 2021-08-20 DIAGNOSIS — Z8679 Personal history of other diseases of the circulatory system: Secondary | ICD-10-CM | POA: Diagnosis not present

## 2021-08-20 DIAGNOSIS — M25561 Pain in right knee: Secondary | ICD-10-CM | POA: Diagnosis not present

## 2021-08-20 DIAGNOSIS — M21372 Foot drop, left foot: Secondary | ICD-10-CM | POA: Diagnosis not present

## 2021-08-27 ENCOUNTER — Ambulatory Visit (HOSPITAL_BASED_OUTPATIENT_CLINIC_OR_DEPARTMENT_OTHER): Payer: Medicare PPO | Attending: Physical Medicine & Rehabilitation | Admitting: Physical Therapy

## 2021-08-27 ENCOUNTER — Other Ambulatory Visit: Payer: Self-pay

## 2021-08-27 ENCOUNTER — Encounter (HOSPITAL_BASED_OUTPATIENT_CLINIC_OR_DEPARTMENT_OTHER): Payer: Self-pay | Admitting: Physical Therapy

## 2021-08-27 DIAGNOSIS — M6281 Muscle weakness (generalized): Secondary | ICD-10-CM | POA: Insufficient documentation

## 2021-08-27 DIAGNOSIS — G8929 Other chronic pain: Secondary | ICD-10-CM | POA: Insufficient documentation

## 2021-08-27 DIAGNOSIS — M25561 Pain in right knee: Secondary | ICD-10-CM | POA: Diagnosis not present

## 2021-08-27 DIAGNOSIS — R262 Difficulty in walking, not elsewhere classified: Secondary | ICD-10-CM | POA: Insufficient documentation

## 2021-08-27 NOTE — Therapy (Signed)
OUTPATIENT PHYSICAL THERAPY TREATMENT NOTE   Patient Name: Kristin RIGGIN, PhD MRN: [id number] DOB:May 14, 1941, 81 y.o., female Today's Date: 08/27/2021  PCP: Donnajean Lopes, MD REFERRING PROVIDER: Donnajean Lopes, MD   PT End of Session - 08/27/21 1017     Visit Number 5    Number of Visits 16    Date for PT Re-Evaluation 09/24/21    Authorization Time Period 8 weeks    Progress Note Due on Visit 10    PT Start Time 1017    PT Stop Time 1100    PT Time Calculation (min) 43 min    Activity Tolerance Patient tolerated treatment well    Behavior During Therapy WFL for tasks assessed/performed             Past Medical History:  Diagnosis Date   Carpal tunnel syndrome    Left Hand   Chest pain    CHF (congestive heart failure) ([place])    Dilated cardiomyopathy. Her original ejection fraction was 10-20%. Her ejection fraction has improved to 57% by 2009   Coronary artery disease    Depression    Dyslipidemia    Foot drop, left    GERD (gastroesophageal reflux disease)    Headache(784.0)    Hemorrhoids    History of anxiety    Hypertension    Lower back pain    Myocardial infarction ([place])    Obesity    Osteoarthritis    Severe with collapse of the left hip   Single functional kidney    single right kidney,  left kidney is atrophic   Past Surgical History:  Procedure Laterality Date   BREAST BIOPSY Right    x2   CARDIAC CATHETERIZATION     Ejection Fraction was around 45-50%   HYSTEROSCOPY  09/03/2011   Procedure: HYSTEROSCOPY;  Surgeon: Logan Bores, MD;  Location: [facility];  Service: Gynecology;  Laterality: N/A;  With Resection of polyp with truclear system   REPLACEMENT TOTAL HIP W/  RESURFACING IMPLANTS  2002   left   TUBAL LIGATION     Patient Active Problem List   Diagnosis Date Noted   Osteoarthritis of knee 08/06/2021   Unsteady gait 08/06/2021   At high risk for falls 07/24/2021   Low bone mass 07/24/2021   Pain in right knee  10/12/2019   Non-pressure chronic ulcer of other part of left foot with fat layer exposed ([place]) 11/03/2017   Encounter for general adult medical examination without abnormal findings 05/11/2017   Acute bronchitis 05/05/2017   Injury 08/21/2016   Complete tear of right rotator cuff 06/18/2016   Localized, primary osteoarthritis of shoulder region, right 06/18/2016   Coronary artery disease involving native coronary artery of native heart without angina pectoris 05/27/2016   Neck pain 02/19/2016   Other slipping, tripping and stumbling without falling, initial encounter 02/19/2016   Pain in thoracic spine 02/19/2016   Posttraumatic headache 02/19/2016   Height loss 01/09/2016   History of total left hip arthroplasty 01/09/2016   Left foot drop 01/09/2016   Postmenopausal status 01/09/2016   Hypokalemia 03/30/2013   Abscess of axilla, right 08/31/2011   Cough 07/29/2011   Sinusitis 07/29/2011   Acquired hallux valgus 05/11/2011   Type 2 diabetes mellitus with foot ulcer (CODE) ([place]) 05/11/2011   Microscopic hematuria 03/18/2011   Psychophysiologic insomnia 32/95/1884   Chronic systolic CHF (congestive heart failure) ([place]) 03/11/2011   Hyperlipidemia 03/11/2011   Carpal tunnel syndrome    GERD (gastroesophageal  reflux disease)    Other specified abnormal findings of blood chemistry 11/22/2009   Vitamin D deficiency 11/22/2009   Rash and other nonspecific skin eruption 11/14/2009   Atrophic vaginitis 11/12/2009   Depressive disorder, not elsewhere classified 11/12/2009   Onychomycosis due to dermatophyte 11/12/2009   Arthropathy 11/07/2009   Congestive heart failure ([place]) 11/07/2009   Low back pain 11/07/2009   Other acquired deformity of ankle and foot(736.79) 11/07/2009   Anxiety disorder 09/20/2009   Cardiomyopathy ([place]) 09/20/2009   Essential hypertension 09/20/2009   Nocturia 09/20/2009   Abnormal weight gain 09/20/2009   Pain in left hip 09/20/2009    REFERRING DIAG:   M25.561,G89.29 (ICD-10-CM) - Chronic pain of right knee  M24.572 (ICD-10-CM) - Ankle contracture, left    THERAPY DIAG:  Chronic pain of right knee  Difficulty in walking, not elsewhere classified  Muscle weakness (generalized)  PERTINENT HISTORY: Right total hip replacement performed in 2003 Chronic left foot drop of 20 years duration with Left AFO  Hallux valgus right  and left side as well as some rotation of her toe she is wearing bearing weight on the lateral border of her great toe on the left side.  Right knee pain.  Possible osteoarthritis plus minus meniscal degeneration.  PRECAUTIONS: Falls Right foot drop with AFO  SUBJECTIVE: My knee pain has gotten better. No pain in seated  PAIN:  Are you having pain? Yes NPRS scale: 2-3/10 right knee when walking- intermittent PAIN TYPE: aching Pain description: aching  Aggravating factors: standing/moving Relieving factors: sitting   DIAGNOSTIC FINDINGS: no diagnostics   PATIENT SURVEYS:  FOTO EVAL: 33   3/8: 36   COGNITION:          Overall cognitive status: Within functional limits for tasks assessed                        SENSATION:          Light touch: Deficits :decreased sensation (light touch) right distal foot     POSTURE:  Slightly kyphotic   PALPATION: Tenderness prepatellar R   LE AROM/PROM:   A/PROM Right 07/30/2021 Left 07/30/2021  Hip flexion      Hip extension      Hip abduction      Hip adduction      Hip internal rotation      Hip external rotation      Knee flexion 110 wfl  Knee extension -25 wfl  Ankle dorsiflexion wfl 5  Ankle plantarflexion wfl wfl  Ankle inversion      Ankle eversion       (Blank rows = not tested)   LE MMT:   MMT Right 07/30/2021 Left 07/30/2021 Right 2/28  Hip flexion 4 4                                           Knee flexion 3+ 5 4+  Knee extension 3+ 5 4+  Ankle dorsiflexion   2   Ankle plantarflexion   4                    (Blank rows = not  tested)     FUNCTIONAL TESTS:  5 times sit to stand: unable to complete limited to pain; 2/28: 23s (wearing new AFO) Timed up and go (TUG): 18 sec   GAIT: Distance  walked: 500 ft Assistive device utilized: Single point cane Level of assistance: Modified independence Comments: Antalgic gait RLE due to decreased and Painful ROM right knee       TODAY'S TREATMENT: 3/8: Reviewed gym machines: cables, leg press, knee extension, knee flexion, abduction, adduction  2/28:  5TSTS test  LAQ with ball in adduction squeeze  SLS with quad contraction  Wide tandem weight shift with glut + quad set  Mini wall sit- practice for pool exercise  Nu step 5 min L5  2/22:  Orthotist spent time trailing AFO  Gastroc stretches: seated with strap & standing lunge- in new AFO  Gait with AFO- PT and vendor educating & instructing  2/13: Supine quad set & added SLR Sidelying hip abd Discussed aquatic exercises   Evaluation; HEP       PATIENT EDUCATION:  Education details: AFO Person educated: Patient Education method: Explanation and demonstration Education comprehension: returned demonstration and verbal     HOME EXERCISE PROGRAM: Access Code: [email or web address] URL:   [email or web address]   ASSESSMENT:   CLINICAL IMPRESSION: Knee extension is improving and pt is demonstrating a more upright gait. Practiced using gym machines today as she is feeling intimidated by them. Good tolerance and encouraged her to ask for help from a training if needed.   Objective impairments include Abnormal gait; Decreased activity tolerance; Decreased range of motion; Decreased strength; Decreased balance; Decreased mobility; Difficulty walking; Increased edema; Impaired sensation; Pain. These impairments are limiting patient from  Bathing; Locomotion Level; Continence; Squat; Stairs; Stand; Transfers . Personal factors including  Age; Comorbidity 2; Comorbidity 1  are also affecting patient's  functional outcome. Patient will benefit from skilled PT to address above impairments and improve overall function.   REHAB POTENTIAL: Good   CLINICAL DECISION MAKING: Stable/uncomplicated   EVALUATION COMPLEXITY: Low     GOALS: Goals reviewed with patient? no   SHORT TERM GOALS:   STG Name Target Date Goal status  1 Pt will improve on FOTO from 33 to 39 progressing pt from a community Ambulator toward an advanced Ambulator Baseline: 36 08/20/2021 ongoing  2 Pt will be independent and compliant with HEP to decrease pain and improve ROM Baseline:  08/20/2021 achieved  3 Right knee extension will improve from -25 to -20 degrees to improve heel strike with ambulation Baseline: -15 08/20/2021 achieved  4 Right knee pain will decrease to <or= 5/10 for improved mobility Baseline: 08/20/2021 achieved                               LONG TERM GOALS:    LTG Name Target Date Goal status  1 Pt will improve on FOTO from 33 to 43 progressing pt from a community Ambulator to an advanced Ambulator Baseline: 09/24/2021 INITIAL  2 Right knee extension will improve to < -20d Baseline: 09/24/2021 INITIAL  3 Pt will improve TUG to < 14 seconds to demonstrate improved strength and ROM. Baseline:18 09/24/2021 INITIAL  4 Pt will improve in right knee flex and extension strength from 3+/5 to >or= 4+/5 Baseline: 09/24/2021 INITIAL                               PLAN: PT FREQUENCY: 1-2x/week   PT DURATION: 8 weeks   PLANNED INTERVENTIONS: Therapeutic exercises, Therapeutic activity, Neuro Muscular re-education, Balance training, Gait training, Patient/Family education, Joint mobilization, Aquatic Therapy, Cryotherapy, Moist heat, and  Taping   PLAN FOR NEXT SESSION: continue to progress land-based balance challenges, quad strength    C.  PT, DPT 08/27/21 11:57 AM

## 2021-08-28 DIAGNOSIS — D2272 Melanocytic nevi of left lower limb, including hip: Secondary | ICD-10-CM | POA: Diagnosis not present

## 2021-08-28 DIAGNOSIS — D225 Melanocytic nevi of trunk: Secondary | ICD-10-CM | POA: Diagnosis not present

## 2021-08-28 DIAGNOSIS — Z85828 Personal history of other malignant neoplasm of skin: Secondary | ICD-10-CM | POA: Diagnosis not present

## 2021-08-28 DIAGNOSIS — D692 Other nonthrombocytopenic purpura: Secondary | ICD-10-CM | POA: Diagnosis not present

## 2021-08-28 DIAGNOSIS — D0359 Melanoma in situ of other part of trunk: Secondary | ICD-10-CM | POA: Diagnosis not present

## 2021-08-28 DIAGNOSIS — D485 Neoplasm of uncertain behavior of skin: Secondary | ICD-10-CM | POA: Diagnosis not present

## 2021-09-02 ENCOUNTER — Telehealth: Payer: Self-pay | Admitting: Cardiovascular Disease

## 2021-09-02 NOTE — Telephone Encounter (Signed)
Left message for patient stating PharmD did not find any interactions between current medications and new Prolia injection, it is OK to take. ? ?Left office number for callback if she has any questions or concerns. ?

## 2021-09-02 NOTE — Telephone Encounter (Signed)
Pt is being prescribed a new medication by her pcp and would like to know if it is ok for her to take... medication is pralia injection twice a year ... please advise  ?

## 2021-09-02 NOTE — Telephone Encounter (Signed)
Prolia does not interact with any of her medications, ok to take. ?

## 2021-09-04 ENCOUNTER — Encounter: Payer: Medicare PPO | Admitting: Physical Medicine & Rehabilitation

## 2021-09-04 DIAGNOSIS — L988 Other specified disorders of the skin and subcutaneous tissue: Secondary | ICD-10-CM | POA: Diagnosis not present

## 2021-09-04 DIAGNOSIS — D0359 Melanoma in situ of other part of trunk: Secondary | ICD-10-CM | POA: Diagnosis not present

## 2021-09-04 DIAGNOSIS — Z85828 Personal history of other malignant neoplasm of skin: Secondary | ICD-10-CM | POA: Diagnosis not present

## 2021-09-10 ENCOUNTER — Ambulatory Visit (HOSPITAL_BASED_OUTPATIENT_CLINIC_OR_DEPARTMENT_OTHER): Payer: Medicare PPO | Admitting: Physical Therapy

## 2021-09-10 ENCOUNTER — Other Ambulatory Visit: Payer: Self-pay

## 2021-09-10 ENCOUNTER — Encounter (HOSPITAL_BASED_OUTPATIENT_CLINIC_OR_DEPARTMENT_OTHER): Payer: Self-pay | Admitting: Physical Therapy

## 2021-09-10 DIAGNOSIS — G8929 Other chronic pain: Secondary | ICD-10-CM

## 2021-09-10 DIAGNOSIS — R262 Difficulty in walking, not elsewhere classified: Secondary | ICD-10-CM

## 2021-09-10 DIAGNOSIS — M6281 Muscle weakness (generalized): Secondary | ICD-10-CM

## 2021-09-10 DIAGNOSIS — M25561 Pain in right knee: Secondary | ICD-10-CM | POA: Diagnosis not present

## 2021-09-10 NOTE — Therapy (Signed)
? ?OUTPATIENT PHYSICAL THERAPY TREATMENT NOTE ? ? ?Patient Name: Kristin GUIN, PhD ?MRN: [id number] ?DOB:[birth year], 81 y.o., female ?Today's Date: 09/10/2021 ? ?PCP: Donnajean Lopes, MD ?REFERRING PROVIDER: Donnajean Lopes, MD ? ? PT End of Session - 09/10/21 1102   ? ? Visit Number 6   ? Number of Visits 16   ? Date for PT Re-Evaluation 09/24/21   ? Authorization Time Period 8 weeks   ? Progress Note Due on Visit 10   ? PT Start Time 1100   ? PT Stop Time 1145   ? PT Time Calculation (min) 45 min   ? Activity Tolerance Patient tolerated treatment well   ? Behavior During Therapy [facility] for tasks assessed/performed   ? ?  ?  ? ?  ? ? ?Past Medical History:  ?Diagnosis Date  ? Carpal tunnel syndrome   ? Left Hand  ? Chest pain   ? CHF (congestive heart failure) ([place])   ? Dilated cardiomyopathy. Her original ejection fraction was 10-20%. Her ejection fraction has improved to 57% by 2009  ? Coronary artery disease   ? Depression   ? Dyslipidemia   ? Foot drop, left   ? GERD (gastroesophageal reflux disease)   ? Headache(784.0)   ? Hemorrhoids   ? History of anxiety   ? Hypertension   ? Lower back pain   ? Myocardial infarction [facility])   ? Obesity   ? Osteoarthritis   ? Severe with collapse of the left hip  ? Single functional kidney   ? single right kidney,  left kidney is atrophic  ? ?Past Surgical History:  ?Procedure Laterality Date  ? BREAST BIOPSY Right   ? x2  ? CARDIAC CATHETERIZATION    ? Ejection Fraction was around 45-50%  ? HYSTEROSCOPY  09/03/2011  ? Procedure: HYSTEROSCOPY;  Surgeon: Logan Bores, MD;  Location: [place] [facility];  Service: Gynecology;  Laterality: N/A;  With Resection of polyp with truclear system  ? REPLACEMENT TOTAL HIP W/  RESURFACING IMPLANTS  2002  ? left  ? TUBAL LIGATION    ? ?Patient Active Problem List  ? Diagnosis Date Noted  ? Osteoarthritis of knee 08/06/2021  ? Unsteady gait 08/06/2021  ? At high risk for falls 07/24/2021  ? Low bone mass 07/24/2021  ? Pain in right knee  10/12/2019  ? Non-pressure chronic ulcer of other part of left foot with fat layer exposed ([place]) 11/03/2017  ? Encounter for general adult medical examination without abnormal findings 05/11/2017  ? Acute bronchitis 05/05/2017  ? Injury 08/21/2016  ? Complete tear of right rotator cuff 06/18/2016  ? Localized, primary osteoarthritis of shoulder region, right 06/18/2016  ? Coronary artery disease involving native coronary artery of native heart without angina pectoris 05/27/2016  ? Neck pain 02/19/2016  ? Other slipping, tripping and stumbling without falling, initial encounter 02/19/2016  ? Pain in thoracic spine 02/19/2016  ? Posttraumatic headache 02/19/2016  ? Height loss 01/09/2016  ? History of total left hip arthroplasty 01/09/2016  ? Left foot drop 01/09/2016  ? Postmenopausal status 01/09/2016  ? Hypokalemia 03/30/2013  ? Abscess of axilla, right 08/31/2011  ? Cough 07/29/2011  ? Sinusitis 07/29/2011  ? Acquired hallux valgus 05/11/2011  ? Type 2 diabetes mellitus with foot ulcer (CODE) ([place]) 05/11/2011  ? Microscopic hematuria 03/18/2011  ? Psychophysiologic insomnia 03/18/2011  ? Chronic systolic CHF (congestive heart failure) ([place]) 03/11/2011  ? Hyperlipidemia 03/11/2011  ? Carpal tunnel syndrome   ? GERD (  gastroesophageal reflux disease)   ? Other specified abnormal findings of blood chemistry 11/22/2009  ? Vitamin D deficiency 11/22/2009  ? Rash and other nonspecific skin eruption 11/14/2009  ? Atrophic vaginitis 11/12/2009  ? Depressive disorder, not elsewhere classified 11/12/2009  ? Onychomycosis due to dermatophyte 11/12/2009  ? Arthropathy 11/07/2009  ? Congestive heart failure ([place]) 11/07/2009  ? Low back pain 11/07/2009  ? Other acquired deformity of ankle and foot(736.79) 11/07/2009  ? Anxiety disorder 09/20/2009  ? Cardiomyopathy ([place]) 09/20/2009  ? Essential hypertension 09/20/2009  ? Nocturia 09/20/2009  ? Abnormal weight gain 09/20/2009  ? Pain in left hip 09/20/2009  ? ? ?REFERRING DIAG:   ?M25.561,G89.29 (ICD-10-CM) - Chronic pain of right knee  ?M24.572 (ICD-10-CM) - Ankle contracture, left  ? ? ?THERAPY DIAG:  ?Chronic pain of right knee ? ?Difficulty in walking, not elsewhere classified ? ?Muscle weakness (generalized) ? ?PERTINENT HISTORY: Right total hip replacement performed in 2003 ?Chronic left foot drop of 20 years duration with Left AFO ? Hallux valgus right  and left side as well as some rotation of her toe she is wearing bearing weight on the lateral border of her great toe on the left side.  ?Right knee pain.  Possible osteoarthritis plus minus meniscal degeneration. ? ?PRECAUTIONS: Falls ?Right foot drop with AFO ? ?SUBJECTIVE: I have not gotten in the pool for about 2 weeks and it really affected my health. I feel a little wobbly on my left foot.  ? ?PAIN:  ?Are you having pain? Yes ?NPRS scale: 3-4/10 right knee when walking- intermittent ?PAIN TYPE: aching ?Pain description: aching  ?Aggravating factors: standing/moving ?Relieving factors: sitting ? ? ?DIAGNOSTIC FINDINGS: no diagnostics ?  ?PATIENT SURVEYS:  ?FOTO EVAL: 33   3/8: 36 ?  ?COGNITION: ?         Overall cognitive status: Within functional limits for tasks assessed              ?          ?SENSATION: ?         Light touch: Deficits :decreased sensation (light touch) right distal foot ?  ?  ?POSTURE:  ?Slightly kyphotic ?  ?PALPATION: ?Tenderness prepatellar R ?  ?LE AROM/PROM: ?  ?A/PROM Right ?07/30/2021 Left ?07/30/2021 09/10/21  ?Hip flexion       ?Hip extension       ?Hip abduction       ?Hip adduction       ?Hip internal rotation       ?Hip external rotation       ?Knee flexion 110 wfl   ?Knee extension -25 wfl Rt: -10  ?Ankle dorsiflexion wfl 5   ?Ankle plantarflexion wfl wfl   ?Ankle inversion       ?Ankle eversion       ? (Blank rows = not tested) ?  ?LE MMT: ?  ?MMT Right ?07/30/2021 Left ?07/30/2021 Right ?2/28  ?Hip flexion 4 4   ?        ?        ?        ?        ?        ?Knee flexion 3+ 5 4+  ?Knee extension 3+ 5  4+  ?Ankle dorsiflexion   2   ?Ankle plantarflexion   4   ?        ?        ? (Blank rows = not tested) ?  ?  ?  FUNCTIONAL TESTS:  ?5 times sit to stand: unable to complete limited to pain; 2/28: 23s (wearing new AFO) ?Timed up and go (TUG): 18 sec ?  ?GAIT: ?Distance walked: 500 ft ?Assistive device utilized: Single point cane ?Level of assistance: Modified independence ?Comments: Antalgic gait RLE due to decreased and Painful ROM right knee ?  ?  ?  ?TODAY'S TREATMENT: ?3/22: ?Ball behind knee, saq ?Ball under heel- quad set ?GAIT TRAINING: quad set at heel strike, broke down gait sequence and prog to full sequence ? ?3/8: ?Reviewed gym machines: cables, leg press, knee extension, knee flexion, abduction, adduction ? ?2/28: ? 5TSTS test ? LAQ with ball in adduction squeeze ? SLS with quad contraction ? Wide tandem weight shift with glut + quad set ? Mini wall sit- practice for pool exercise ? Nu step 5 min L5 ? ?2/22: ? Orthotist spent time trailing AFO ? Gastroc stretches: seated with strap & standing lunge- in new AFO ? Gait with AFO- PT and vendor educating & instructing ? ?  ?  ?  ?PATIENT EDUCATION:  ?Education details: AFO ?Person educated: Patient ?Education method: Explanation and demonstration ?Education comprehension: returned demonstration and verbal ?  ?  ?HOME EXERCISE PROGRAM: ?Access Code: [email or web address] URL:  ? ?[email or web address]  ? ?ASSESSMENT: ?  ?CLINICAL IMPRESSION: ?Improving knee extension noted. Focused on continuing flexibility as well as proximal control for gait stability.  ? ?Objective impairments include Abnormal gait; Decreased activity tolerance; Decreased range of motion; Decreased strength; Decreased balance; Decreased mobility; Difficulty walking; Increased edema; Impaired sensation; Pain. These impairments are limiting patient from  Bathing; Locomotion Level; Continence; Squat; Stairs; Stand; Transfers . Personal factors including  Age; Comorbidity 2; Comorbidity 1  are  also affecting patient's functional outcome. Patient will benefit from skilled PT to address above impairments and improve overall function. ?  ?REHAB POTENTIAL: Good ?  ?CLINICAL DECISION MAKING: Stable/u

## 2021-09-18 ENCOUNTER — Encounter (HOSPITAL_BASED_OUTPATIENT_CLINIC_OR_DEPARTMENT_OTHER): Payer: Self-pay | Admitting: Physical Therapy

## 2021-09-18 ENCOUNTER — Ambulatory Visit (HOSPITAL_BASED_OUTPATIENT_CLINIC_OR_DEPARTMENT_OTHER): Payer: Medicare PPO | Admitting: Physical Therapy

## 2021-09-18 DIAGNOSIS — R262 Difficulty in walking, not elsewhere classified: Secondary | ICD-10-CM | POA: Diagnosis not present

## 2021-09-18 DIAGNOSIS — G8929 Other chronic pain: Secondary | ICD-10-CM | POA: Diagnosis not present

## 2021-09-18 DIAGNOSIS — M6281 Muscle weakness (generalized): Secondary | ICD-10-CM | POA: Diagnosis not present

## 2021-09-18 DIAGNOSIS — M25561 Pain in right knee: Secondary | ICD-10-CM | POA: Diagnosis not present

## 2021-09-18 NOTE — Therapy (Signed)
? ?OUTPATIENT PHYSICAL THERAPY TREATMENT NOTE ? ? ?Patient Name: Kristin SALONE, PhD ?MRN: [id number] ?DOB:[birth year], 81 y.o., female ?Today's Date: 09/18/2021 ? ?PCP: Donnajean Lopes, MD ?REFERRING PROVIDER: Donnajean Lopes, MD ? ? PT End of Session - 09/18/21 1350   ? ? Visit Number 7   ? Number of Visits 16   ? Date for PT Re-Evaluation 09/24/21   ? Authorization Time Period 8 weeks   ? Progress Note Due on Visit 10   ? PT Start Time 1345   ? PT Stop Time 1426   ? PT Time Calculation (min) 41 min   ? Activity Tolerance Patient tolerated treatment well   ? Behavior During Therapy [facility] for tasks assessed/performed   ? ?  ?  ? ?  ? ? ?Past Medical History:  ?Diagnosis Date  ? Carpal tunnel syndrome   ? Left Hand  ? Chest pain   ? CHF (congestive heart failure) ([place])   ? Dilated cardiomyopathy. Her original ejection fraction was 10-20%. Her ejection fraction has improved to 57% by 2009  ? Coronary artery disease   ? Depression   ? Dyslipidemia   ? Foot drop, left   ? GERD (gastroesophageal reflux disease)   ? Headache(784.0)   ? Hemorrhoids   ? History of anxiety   ? Hypertension   ? Lower back pain   ? Myocardial infarction [facility])   ? Obesity   ? Osteoarthritis   ? Severe with collapse of the left hip  ? Single functional kidney   ? single right kidney,  left kidney is atrophic  ? ?Past Surgical History:  ?Procedure Laterality Date  ? BREAST BIOPSY Right   ? x2  ? CARDIAC CATHETERIZATION    ? Ejection Fraction was around 45-50%  ? HYSTEROSCOPY  09/03/2011  ? Procedure: HYSTEROSCOPY;  Surgeon: Logan Bores, MD;  Location: [facility];  Service: Gynecology;  Laterality: N/A;  With Resection of polyp with truclear system  ? REPLACEMENT TOTAL HIP W/  RESURFACING IMPLANTS  2002  ? left  ? TUBAL LIGATION    ? ?Patient Active Problem List  ? Diagnosis Date Noted  ? Osteoarthritis of knee 08/06/2021  ? Unsteady gait 08/06/2021  ? At high risk for falls 07/24/2021  ? Low bone mass 07/24/2021  ? Pain in right knee  10/12/2019  ? Non-pressure chronic ulcer of other part of left foot with fat layer exposed ([place]) 11/03/2017  ? Encounter for general adult medical examination without abnormal findings 05/11/2017  ? Acute bronchitis 05/05/2017  ? Injury 08/21/2016  ? Complete tear of right rotator cuff 06/18/2016  ? Localized, primary osteoarthritis of shoulder region, right 06/18/2016  ? Coronary artery disease involving native coronary artery of native heart without angina pectoris 05/27/2016  ? Neck pain 02/19/2016  ? Other slipping, tripping and stumbling without falling, initial encounter 02/19/2016  ? Pain in thoracic spine 02/19/2016  ? Posttraumatic headache 02/19/2016  ? Height loss 01/09/2016  ? History of total left hip arthroplasty 01/09/2016  ? Left foot drop 01/09/2016  ? Postmenopausal status 01/09/2016  ? Hypokalemia 03/30/2013  ? Abscess of axilla, right 08/31/2011  ? Cough 07/29/2011  ? Sinusitis 07/29/2011  ? Acquired hallux valgus 05/11/2011  ? Type 2 diabetes mellitus with foot ulcer (CODE) ([place]) 05/11/2011  ? Microscopic hematuria 03/18/2011  ? Psychophysiologic insomnia 03/18/2011  ? Chronic systolic CHF (congestive heart failure) ([place]) 03/11/2011  ? Hyperlipidemia 03/11/2011  ? Carpal tunnel syndrome   ? GERD (  gastroesophageal reflux disease)   ? Other specified abnormal findings of blood chemistry 11/22/2009  ? Vitamin D deficiency 11/22/2009  ? Rash and other nonspecific skin eruption 11/14/2009  ? Atrophic vaginitis 11/12/2009  ? Depressive disorder, not elsewhere classified 11/12/2009  ? Onychomycosis due to dermatophyte 11/12/2009  ? Arthropathy 11/07/2009  ? Congestive heart failure ([place]) 11/07/2009  ? Low back pain 11/07/2009  ? Other acquired deformity of ankle and foot(736.79) 11/07/2009  ? Anxiety disorder 09/20/2009  ? Cardiomyopathy ([place]) 09/20/2009  ? Essential hypertension 09/20/2009  ? Nocturia 09/20/2009  ? Abnormal weight gain 09/20/2009  ? Pain in left hip 09/20/2009  ? ? ?REFERRING DIAG:   ?M25.561,G89.29 (ICD-10-CM) - Chronic pain of right knee  ?M24.572 (ICD-10-CM) - Ankle contracture, left  ? ? ?THERAPY DIAG:  ?Chronic pain of right knee ? ?Difficulty in walking, not elsewhere classified ? ?Muscle weakness (generalized) ? ?PERTINENT HISTORY: Right total hip replacement performed in 2003 ?Chronic left foot drop of 20 years duration with Left AFO ? Hallux valgus right  and left side as well as some rotation of her toe she is wearing bearing weight on the lateral border of her great toe on the left side.  ?Right knee pain.  Possible osteoarthritis plus minus meniscal degeneration. ? ?PRECAUTIONS: Falls ?Right foot drop with AFO ? ?SUBJECTIVE: Still not back in the pool but I did get my stitches out. My knee was hurting a little more after doing chair yoga.  ? ?PAIN:  ?Are you having pain? Yes ?NPRS scale: 4/10 right knee  ?PAIN TYPE: aching ?Pain description: aching  ?Aggravating factors: standing/moving ?Relieving factors: sitting ? ? ?DIAGNOSTIC FINDINGS: no diagnostics ?  ?PATIENT SURVEYS:  ?FOTO EVAL: 33   3/8: 36 ?  ?COGNITION: ?         Overall cognitive status: Within functional limits for tasks assessed              ?          ?SENSATION: ?         Light touch: Deficits :decreased sensation (light touch) right distal foot ?  ?  ?POSTURE:  ?Slightly kyphotic ?  ?PALPATION: ?Tenderness prepatellar R ?  ?LE AROM/PROM: ?  ?A/PROM Right ?07/30/2021 Left ?07/30/2021 09/10/21  ?Hip flexion       ?Hip extension       ?Hip abduction       ?Hip adduction       ?Hip internal rotation       ?Hip external rotation       ?Knee flexion 110 wfl   ?Knee extension -25 wfl Rt: -10  ?Ankle dorsiflexion wfl 5   ?Ankle plantarflexion wfl wfl   ?Ankle inversion       ?Ankle eversion       ? (Blank rows = not tested) ?  ?LE MMT: ?  ?MMT Right ?07/30/2021 Left ?07/30/2021 Right ?2/28  ?Hip flexion 4 4   ?        ?        ?        ?        ?        ?Knee flexion 3+ 5 4+  ?Knee extension 3+ 5 4+  ?Ankle dorsiflexion   2    ?Ankle plantarflexion   4   ?        ?        ? (Blank rows = not tested) ?  ?  ?FUNCTIONAL TESTS:  ?  5 times sit to stand: unable to complete limited to pain; 2/28: 23s (wearing new AFO) ?Timed up and go (TUG): 18 sec ?  ?GAIT: ?Distance walked: 500 ft ?Assistive device utilized: Single point cane ?Level of assistance: Modified independence ?Comments: Antalgic gait RLE due to decreased and Painful ROM right knee ?  ?  ?  ?TODAY'S TREATMENT: ?3/30: ?Nustep 6 min L6 ?Gait training- upright posture/Rt glut activation in stance phase, Rt heel strike to reduce drag ?Education on donning/doffing AFO ?LAQ ball bw knees 2x10 each, trying to keep the ball still ? ?3/22: ?Ball behind knee, saq ?Ball under heel- quad set ?GAIT TRAINING: quad set at heel strike, broke down gait sequence and prog to full sequence ? ?3/8: ?Reviewed gym machines: cables, leg press, knee extension, knee flexion, abduction, adduction ? ?2/28: ? 5TSTS test ? LAQ with ball in adduction squeeze ? SLS with quad contraction ? Wide tandem weight shift with glut + quad set ? Mini wall sit- practice for pool exercise ? Nu step 5 min L5 ? ?2/22: ? Orthotist spent time trailing AFO ? Gastroc stretches: seated with strap & standing lunge- in new AFO ? Gait with AFO- PT and vendor educating & instructing ? ?  ?  ?  ?PATIENT EDUCATION:  ?Education details: exercise form/rationale ?Person educated: Patient ?Education method: Explanation and demonstration ?Education comprehension: returned demonstration and verbal ?  ?  ?HOME EXERCISE PROGRAM: ?Access Code: [email or web address]  ?URL:  [email or web address]  ? ?ASSESSMENT: ?  ?CLINICAL IMPRESSION: ?Improved upright posture in gait with Right heel strike without drag and activated gluts. May bring shoe laces with her at next visit to assist in changing them.  ? ?Objective impairments include Abnormal gait; Decreased activity tolerance; Decreased range of motion; Decreased strength; Decreased balance; Decreased  mobility; Difficulty walking; Increased edema; Impaired sensation; Pain. These impairments are limiting patient from  Bathing; Locomotion Level; Continence; Squat; Stairs; Stand; Transfers . Personal factors incl

## 2021-09-23 ENCOUNTER — Ambulatory Visit (HOSPITAL_BASED_OUTPATIENT_CLINIC_OR_DEPARTMENT_OTHER): Payer: Medicare PPO | Attending: Physical Medicine & Rehabilitation | Admitting: Physical Therapy

## 2021-09-23 ENCOUNTER — Encounter (HOSPITAL_BASED_OUTPATIENT_CLINIC_OR_DEPARTMENT_OTHER): Payer: Self-pay | Admitting: Physical Therapy

## 2021-09-23 DIAGNOSIS — R262 Difficulty in walking, not elsewhere classified: Secondary | ICD-10-CM | POA: Insufficient documentation

## 2021-09-23 DIAGNOSIS — M6281 Muscle weakness (generalized): Secondary | ICD-10-CM | POA: Diagnosis not present

## 2021-09-23 DIAGNOSIS — M25561 Pain in right knee: Secondary | ICD-10-CM | POA: Diagnosis not present

## 2021-09-23 DIAGNOSIS — G8929 Other chronic pain: Secondary | ICD-10-CM | POA: Diagnosis not present

## 2021-09-23 NOTE — Therapy (Addendum)
? ?OUTPATIENT PHYSICAL THERAPY TREATMENT NOTE/Discharge ? ? ?Patient Name: Kristin HILLARY, PhD ?MRN: [id number] ?DOB:11-Apr-1941, 81 y.o., female ?Today's Date: 09/23/2021 ? ?PCP: Donnajean Lopes, MD ?REFERRING PROVIDER: Donnajean Lopes, MD ? ? PT End of Session - 09/23/21 0930   ? ? Visit Number 8   ? Number of Visits 16   ? Date for PT Re-Evaluation 09/24/21   ? Authorization Time Period 8 weeks   ? Progress Note Due on Visit 10   ? PT Start Time [phone]   ? PT Stop Time 1014   ? PT Time Calculation (min) 46 min   ? Activity Tolerance Patient tolerated treatment well   ? Behavior During Therapy [facility] for tasks assessed/performed   ? ?  ?  ? ?  ? ? ?Past Medical History:  ?Diagnosis Date  ? Carpal tunnel syndrome   ? Left Hand  ? Chest pain   ? CHF (congestive heart failure) ([place])   ? Dilated cardiomyopathy. Her original ejection fraction was 10-20%. Her ejection fraction has improved to 57% by 2009  ? Coronary artery disease   ? Depression   ? Dyslipidemia   ? Foot drop, left   ? GERD (gastroesophageal reflux disease)   ? Headache(784.0)   ? Hemorrhoids   ? History of anxiety   ? Hypertension   ? Lower back pain   ? Myocardial infarction [facility])   ? Obesity   ? Osteoarthritis   ? Severe with collapse of the left hip  ? Single functional kidney   ? single right kidney,  left kidney is atrophic  ? ?Past Surgical History:  ?Procedure Laterality Date  ? BREAST BIOPSY Right   ? x2  ? CARDIAC CATHETERIZATION    ? Ejection Fraction was around 45-50%  ? HYSTEROSCOPY  09/03/2011  ? Procedure: HYSTEROSCOPY;  Surgeon: Logan Bores, MD;  Location: [name];  Service: Gynecology;  Laterality: N/A;  With Resection of polyp with truclear system  ? REPLACEMENT TOTAL HIP W/  RESURFACING IMPLANTS  2002  ? left  ? TUBAL LIGATION    ? ?Patient Active Problem List  ? Diagnosis Date Noted  ? Osteoarthritis of knee 08/06/2021  ? Unsteady gait 08/06/2021  ? At high risk for falls 07/24/2021  ? Low bone mass 07/24/2021  ? Pain in right  knee 10/12/2019  ? Non-pressure chronic ulcer of other part of left foot with fat layer exposed ([place]) 11/03/2017  ? Encounter for general adult medical examination without abnormal findings 05/11/2017  ? Acute bronchitis 05/05/2017  ? Injury 08/21/2016  ? Complete tear of right rotator cuff 06/18/2016  ? Localized, primary osteoarthritis of shoulder region, right 06/18/2016  ? Coronary artery disease involving native coronary artery of native heart without angina pectoris 05/27/2016  ? Neck pain 02/19/2016  ? Other slipping, tripping and stumbling without falling, initial encounter 02/19/2016  ? Pain in thoracic spine 02/19/2016  ? Posttraumatic headache 02/19/2016  ? Height loss 01/09/2016  ? History of total left hip arthroplasty 01/09/2016  ? Left foot drop 01/09/2016  ? Postmenopausal status 01/09/2016  ? Hypokalemia 03/30/2013  ? Abscess of axilla, right 08/31/2011  ? Cough 07/29/2011  ? Sinusitis 07/29/2011  ? Acquired hallux valgus 05/11/2011  ? Type 2 diabetes mellitus with foot ulcer (CODE) ([place]) 05/11/2011  ? Microscopic hematuria 03/18/2011  ? Psychophysiologic insomnia 03/18/2011  ? Chronic systolic CHF (congestive heart failure) ([place]) 03/11/2011  ? Hyperlipidemia 03/11/2011  ? Carpal tunnel syndrome   ? GERD (  gastroesophageal reflux disease)   ? Other specified abnormal findings of blood chemistry 11/22/2009  ? Vitamin D deficiency 11/22/2009  ? Rash and other nonspecific skin eruption 11/14/2009  ? Atrophic vaginitis 11/12/2009  ? Depressive disorder, not elsewhere classified 11/12/2009  ? Onychomycosis due to dermatophyte 11/12/2009  ? Arthropathy 11/07/2009  ? Congestive heart failure ([place]) 11/07/2009  ? Low back pain 11/07/2009  ? Other acquired deformity of ankle and foot(736.79) 11/07/2009  ? Anxiety disorder 09/20/2009  ? Cardiomyopathy ([place]) 09/20/2009  ? Essential hypertension 09/20/2009  ? Nocturia 09/20/2009  ? Abnormal weight gain 09/20/2009  ? Pain in left hip 09/20/2009  ? ? ?REFERRING  DIAG:  ?M25.561,G89.29 (ICD-10-CM) - Chronic pain of right knee  ?M24.572 (ICD-10-CM) - Ankle contracture, left  ? ? ?THERAPY DIAG:  ?Chronic pain of right knee ? ?Difficulty in walking, not elsewhere classified ? ?Muscle weakness (generalized) ? ?PERTINENT HISTORY: Right total hip replacement performed in 2003 ?Chronic left foot drop of 20 years duration with Left AFO ? Hallux valgus right  and left side as well as some rotation of her toe she is wearing bearing weight on the lateral border of her great toe on the left side.  ?Right knee pain.  Possible osteoarthritis plus minus meniscal degeneration. ? ?PRECAUTIONS: Falls ?Right foot drop with AFO ? ?SUBJECTIVE: I feel a little sluggish today. Knee pain is not bad.  ? ? ?PAIN:  ?Are you having pain? Yes ?NPRS scale: 2/10 right knee  ?PAIN TYPE: aching ?Pain description: aching  ?Aggravating factors: standing/moving ?Relieving factors: sitting ? ? ?DIAGNOSTIC FINDINGS: no diagnostics ?  ?PATIENT SURVEYS:  ?FOTO EVAL: 33   3/8: 36     4/4: 43 ?  ?COGNITION: ?         Overall cognitive status: Within functional limits for tasks assessed              ?          ?SENSATION: ?         Light touch: Deficits :decreased sensation (light touch) right distal foot ?  ?  ?POSTURE:  ?Able to demo upright posture with lack of knee ext on Right side ? ?  ?LE AROM/PROM: ?  ?A/PROM Right ?07/30/2021 Left ?07/30/2021 09/10/21  ?Hip flexion       ?Hip extension       ?Hip abduction       ?Hip adduction       ?Hip internal rotation       ?Hip external rotation       ?Knee flexion 110 wfl   ?Knee extension -25 wfl Rt: -10  ?Ankle dorsiflexion wfl 5   ?Ankle plantarflexion wfl wfl   ?Ankle inversion       ?Ankle eversion       ? (Blank rows = not tested) ?  ?LE MMT: ?  ?MMT Right ?07/30/2021 Left ?07/30/2021 Right ?2/28  ?Hip flexion 4 4   ?        ?        ?        ?        ?        ?Knee flexion 3+ 5 4+  ?Knee extension 3+ 5 4+  ?Ankle dorsiflexion   2   ?Ankle plantarflexion   4   ?        ?         ? (Blank rows = not tested) ?  ?  ?FUNCTIONAL TESTS:  ?  5 times sit to stand: unable to complete limited to pain; 2/28: 23s (wearing new AFO) ?4/4: 20s ?Timed up and go (TUG): 18 sec  ?4/4: 14s ?  ?GAIT: ?Distance walked: 500 ft ?Assistive device utilized: Single point cane ?Level of assistance: Modified independence ?Comments: Antalgic gait RLE due to decreased and Painful ROM right knee ?  ?  ?  ?TODAY'S TREATMENT: ?4/4: ?Nu step 5 min L6 ?TUG & STS testing ?Curb & stair navigation with cane ? ?3/30: ?Nustep 6 min L6 ?Gait training- upright posture/Rt glut activation in stance phase, Rt heel strike to reduce drag ?Education on donning/doffing AFO ?LAQ ball bw knees 2x10 each, trying to keep the ball still ? ?3/22: ?Ball behind knee, saq ?Ball under heel- quad set ?GAIT TRAINING: quad set at heel strike, broke down gait sequence and prog to full sequence ? ?3/8: ?Reviewed gym machines: cables, leg press, knee extension, knee flexion, abduction, adduction ? ?2/28: ? 5TSTS test ? LAQ with ball in adduction squeeze ? SLS with quad contraction ? Wide tandem weight shift with glut + quad set ? Mini wall sit- practice for pool exercise ? Nu step 5 min L5 ? ?2/22: ? Orthotist spent time trailing AFO ? Gastroc stretches: seated with strap & standing lunge- in new AFO ? Gait with AFO- PT and vendor educating & instructing ? ?  ?  ?  ?PATIENT EDUCATION:  ?Education details: exercise form/rationale ?Person educated: Patient ?Education method: Explanation and demonstration ?Education comprehension: returned demonstration and verbal ?  ?  ?HOME EXERCISE PROGRAM: ?Access Code: [email or web address]  ?URL:  [email or web address]  ? ?ASSESSMENT: ?  ?CLINICAL IMPRESSION: ?Pt has met all of her goals at this time and is prepared for d/c to independent program. I encouraged her to continue working at this level, increase pool to 4 days/ week and practice stairs & curbs the correct way. Encouraged her to contact me with any  further questions.  ? ?Objective impairments include Abnormal gait; Decreased activity tolerance; Decreased range of motion; Decreased strength; Decreased balance; Decreased mobility; Difficulty walking; I

## 2021-09-24 DIAGNOSIS — F429 Obsessive-compulsive disorder, unspecified: Secondary | ICD-10-CM | POA: Diagnosis not present

## 2021-10-01 DIAGNOSIS — M810 Age-related osteoporosis without current pathological fracture: Secondary | ICD-10-CM | POA: Diagnosis not present

## 2021-10-07 ENCOUNTER — Ambulatory Visit
Admission: RE | Admit: 2021-10-07 | Discharge: 2021-10-07 | Disposition: A | Discharge status: Home / Self Care | Payer: Medicare PPO | Source: Ambulatory Visit | Attending: Obstetrics and Gynecology | Admitting: Obstetrics and Gynecology

## 2021-10-07 DIAGNOSIS — Z1231 Encounter for screening mammogram for malignant neoplasm of breast: Secondary | ICD-10-CM

## 2021-10-08 DIAGNOSIS — F429 Obsessive-compulsive disorder, unspecified: Secondary | ICD-10-CM | POA: Diagnosis not present

## 2021-10-14 DIAGNOSIS — E785 Hyperlipidemia, unspecified: Secondary | ICD-10-CM | POA: Diagnosis not present

## 2021-10-14 DIAGNOSIS — I10 Essential (primary) hypertension: Secondary | ICD-10-CM | POA: Diagnosis not present

## 2021-10-14 DIAGNOSIS — Z0000 Encounter for general adult medical examination without abnormal findings: Secondary | ICD-10-CM | POA: Diagnosis not present

## 2021-10-14 DIAGNOSIS — E559 Vitamin D deficiency, unspecified: Secondary | ICD-10-CM | POA: Diagnosis not present

## 2021-10-14 DIAGNOSIS — E11621 Type 2 diabetes mellitus with foot ulcer: Secondary | ICD-10-CM | POA: Diagnosis not present

## 2021-10-21 DIAGNOSIS — E11621 Type 2 diabetes mellitus with foot ulcer: Secondary | ICD-10-CM | POA: Diagnosis not present

## 2021-10-21 DIAGNOSIS — I2510 Atherosclerotic heart disease of native coronary artery without angina pectoris: Secondary | ICD-10-CM | POA: Diagnosis not present

## 2021-10-21 DIAGNOSIS — M25561 Pain in right knee: Secondary | ICD-10-CM | POA: Diagnosis not present

## 2021-10-21 DIAGNOSIS — I10 Essential (primary) hypertension: Secondary | ICD-10-CM | POA: Diagnosis not present

## 2021-10-21 DIAGNOSIS — R200 Anesthesia of skin: Secondary | ICD-10-CM | POA: Diagnosis not present

## 2021-10-21 DIAGNOSIS — Z0000 Encounter for general adult medical examination without abnormal findings: Secondary | ICD-10-CM | POA: Diagnosis not present

## 2021-10-21 DIAGNOSIS — E785 Hyperlipidemia, unspecified: Secondary | ICD-10-CM | POA: Diagnosis not present

## 2021-10-21 DIAGNOSIS — E669 Obesity, unspecified: Secondary | ICD-10-CM | POA: Diagnosis not present

## 2021-10-21 DIAGNOSIS — M21372 Foot drop, left foot: Secondary | ICD-10-CM | POA: Diagnosis not present

## 2021-10-22 DIAGNOSIS — F429 Obsessive-compulsive disorder, unspecified: Secondary | ICD-10-CM | POA: Diagnosis not present

## 2021-11-04 ENCOUNTER — Ambulatory Visit: Payer: Medicare PPO | Admitting: Podiatry

## 2021-11-04 ENCOUNTER — Encounter: Payer: Self-pay | Admitting: Podiatry

## 2021-11-04 DIAGNOSIS — B351 Tinea unguium: Secondary | ICD-10-CM | POA: Diagnosis not present

## 2021-11-04 DIAGNOSIS — L84 Corns and callosities: Secondary | ICD-10-CM | POA: Diagnosis not present

## 2021-11-04 DIAGNOSIS — H9193 Unspecified hearing loss, bilateral: Secondary | ICD-10-CM | POA: Insufficient documentation

## 2021-11-04 DIAGNOSIS — M79676 Pain in unspecified toe(s): Secondary | ICD-10-CM

## 2021-11-04 NOTE — Progress Notes (Signed)
She presents today for routine nail debridement as well as evaluation of her ulceration to the hallux left.  She denies any changes in her past medical history medications or allergies. ? ?Objective: Vital signs stable alert oriented x3 pulses are palpable.  Hallux valgus with hallux interphalangeal is resulted in a superficial ulceration which currently is closed to the plantar medial aspect of the IP joint hallux left.  Toenails are thick yellow dystrophic clinical mycotic and there are no other open lesions or wounds on the feet bilaterally. ? ?Assessment: Chronic ulceration left foot.  Toenails painful mycotic bilateral. ? ?Plan: Debridement of toenails 1 through 5 bilateral debridement of skin ulceration hallux left.  Placed a silicone padding after dry sterile dressing we will follow-up with her in 3 months ?

## 2021-11-05 DIAGNOSIS — F429 Obsessive-compulsive disorder, unspecified: Secondary | ICD-10-CM | POA: Diagnosis not present

## 2021-11-19 DIAGNOSIS — F429 Obsessive-compulsive disorder, unspecified: Secondary | ICD-10-CM | POA: Diagnosis not present

## 2021-11-24 DIAGNOSIS — Z85828 Personal history of other malignant neoplasm of skin: Secondary | ICD-10-CM | POA: Diagnosis not present

## 2021-11-24 DIAGNOSIS — L820 Inflamed seborrheic keratosis: Secondary | ICD-10-CM | POA: Diagnosis not present

## 2021-12-03 DIAGNOSIS — F429 Obsessive-compulsive disorder, unspecified: Secondary | ICD-10-CM | POA: Diagnosis not present

## 2021-12-04 ENCOUNTER — Ambulatory Visit: Payer: Medicare PPO | Admitting: Orthopedic Surgery

## 2021-12-04 ENCOUNTER — Encounter: Payer: Self-pay | Admitting: Orthopedic Surgery

## 2021-12-04 DIAGNOSIS — R29898 Other symptoms and signs involving the musculoskeletal system: Secondary | ICD-10-CM

## 2021-12-04 DIAGNOSIS — R200 Anesthesia of skin: Secondary | ICD-10-CM

## 2021-12-04 NOTE — Progress Notes (Signed)
Office Visit Note   Patient: Kristin Ina, PhD           Date of Birth: [birth year]           MRN: [id number] Visit Date: 12/04/2021              Requested by: Donnajean Lopes, MD [place],  [place] [id number] PCP: Donnajean Lopes, MD   Assessment & Plan: Visit Diagnoses:  1. Numbness of right hand   2. Right hand weakness     Plan: Discussed with patient that she has history exam findings consistent with severe and prolonged nerve entrapment.  She seems to have involvement of both the ulnar and median nerves.  She has significant thenar atrophy and less severe intrinsic atrophy.  We discussed the nature of both carpal and cubital tunnel syndrome as well as its diagnosis, prognosis, both surgical surgical and conservative treatment options.  After discussion, she would like to proceed with a EMG/nerve conduction study to further evaluate the nature of her neuropathy.  Follow-Up Instructions: No follow-ups on file.   Orders:  No orders of the defined types were placed in this encounter.  No orders of the defined types were placed in this encounter.     Procedures: No procedures performed   Clinical Data: No additional findings.   Subjective: Chief Complaint  Patient presents with   Right Hand - Weakness, Numbness    This is a 81 year old right-hand-dominant female who presents with numbness of the right hand.  This is a longstanding issue for and began 10 to 12 years ago.  She does note that she had a left carpal tunnel release done 20 years or so ago.  She describes constant baseline numbness of all of her fingers.  She does have nocturnal symptoms sporadically in which she wakes from sleep with her hand painful and numb.  She also describes significant weakness of her hand.  She has difficulty with certain activities but specifically mentions opening jars or lids.  She is never had any treatment for this.  She is never had any electrodiagnostic  studies.  Weakness Associated symptoms include weakness.    Review of Systems  Neurological:  Positive for weakness.     Objective: Vital Signs: There were no vitals taken for this visit.  Physical Exam Constitutional:      Appearance: Normal appearance.  Cardiovascular:     Rate and Rhythm: Normal rate.     Pulses: Normal pulses.  Pulmonary:     Effort: Pulmonary effort is normal.  Skin:    General: Skin is warm and dry.     Capillary Refill: Capillary refill takes less than 2 seconds.  Neurological:     Mental Status: She is alert.     Right Hand Exam   Tenderness  The patient is experiencing no tenderness.   Muscle Strength  Grip: 3/5   Other  Erythema: absent Sensation: decreased Pulse: present  Comments:  Severe thenar atrophy with no active opposition or palmar/radial abduction.  FDI atrophy with 3+/5 strength.  Positive Tinel and Phalen signs at wrist.  Negative Durkan sign.  Positive Tinel at elbow w/out evidence of ulnar nerve instability.  2PD is >72m in all fingers.       Specialty Comments:  No specialty comments available.  Imaging: No results found.   PMFS History: Patient Active Problem List   Diagnosis Date Noted   Numbness of right hand 12/04/2021   Right hand  weakness 12/04/2021   Bilateral hearing loss 11/04/2021   Hx of compression fracture of spine 08/20/2021   Senile osteoporosis 08/20/2021   Osteoarthritis of knee 08/06/2021   Unsteady gait 08/06/2021   At high risk for falls 07/24/2021   Low bone mass 07/24/2021   Pain in right knee 10/12/2019   Non-pressure chronic ulcer of other part of left foot with fat layer exposed ([place]) 11/03/2017   Encounter for general adult medical examination without abnormal findings 05/11/2017   Acute bronchitis 05/05/2017   Injury 08/21/2016   Complete tear of right rotator cuff 06/18/2016   Localized, primary osteoarthritis of shoulder region, right 06/18/2016   Coronary artery disease  involving native coronary artery of native heart without angina pectoris 05/27/2016   Neck pain 02/19/2016   Other slipping, tripping and stumbling without falling, initial encounter 02/19/2016   Pain in thoracic spine 02/19/2016   Posttraumatic headache 02/19/2016   Height loss 01/09/2016   History of total left hip arthroplasty 01/09/2016   Left foot drop 01/09/2016   Postmenopausal status 01/09/2016   Hypokalemia 03/30/2013   Abscess of axilla, right 08/31/2011   Cough 07/29/2011   Sinusitis 07/29/2011   Acquired hallux valgus 05/11/2011   Type 2 diabetes mellitus with foot ulcer (CODE) ([place]) 05/11/2011   Microscopic hematuria 03/18/2011   Psychophysiologic insomnia 02/58/5277   Chronic systolic CHF (congestive heart failure) ([place]) 03/11/2011   Hyperlipidemia 03/11/2011   Carpal tunnel syndrome    GERD (gastroesophageal reflux disease)    Other specified abnormal findings of blood chemistry 11/22/2009   Vitamin D deficiency 11/22/2009   Rash and other nonspecific skin eruption 11/14/2009   Atrophic vaginitis 11/12/2009   Depressive disorder, not elsewhere classified 11/12/2009   Onychomycosis due to dermatophyte 11/12/2009   Arthropathy 11/07/2009   Congestive heart failure ([place]) 11/07/2009   Low back pain 11/07/2009   Other acquired deformity of ankle and foot(736.79) 11/07/2009   Anxiety disorder 09/20/2009   Cardiomyopathy ([place]) 09/20/2009   Essential hypertension 09/20/2009   Nocturia 09/20/2009   Abnormal weight gain 09/20/2009   Pain in left hip 09/20/2009   Past Medical History:  Diagnosis Date   Carpal tunnel syndrome    Left Hand   Chest pain    CHF (congestive heart failure) ([facility])    Dilated cardiomyopathy. Her original ejection fraction was 10-20%. Her ejection fraction has improved to 57% by 2009   Coronary artery disease    Depression    Dyslipidemia    Foot drop, left    GERD (gastroesophageal reflux disease)    Headache(784.0)    Hemorrhoids     History of anxiety    Hypertension    Lower back pain    Myocardial infarction ([place])    Obesity    Osteoarthritis    Severe with collapse of the left hip   Single functional kidney    single right kidney,  left kidney is atrophic    Family History  Problem Relation Age of Onset   Stroke Mother    Cancer Sister    Stroke Maternal Grandfather    Breast cancer Neg Hx     Past Surgical History:  Procedure Laterality Date   BREAST BIOPSY Right    x2   CARDIAC CATHETERIZATION     Ejection Fraction was around 45-50%   HYSTEROSCOPY  09/03/2011   Procedure: HYSTEROSCOPY;  Surgeon: Logan Bores, MD;  Location: [facility];  Service: Gynecology;  Laterality: N/A;  With Resection of polyp with truclear system  REPLACEMENT TOTAL HIP W/  RESURFACING IMPLANTS  2002   left   TUBAL LIGATION     Social History   Occupational History   Not on file  Tobacco Use   Smoking status: Never   Smokeless tobacco: Never  Vaping Use   Vaping Use: Never used  Substance and Sexual Activity   Alcohol use: No    Alcohol/week: 0.0 standard drinks of alcohol   Drug use: No   Sexual activity: Yes    Birth control/protection: Surgical

## 2021-12-16 DIAGNOSIS — F329 Major depressive disorder, single episode, unspecified: Secondary | ICD-10-CM | POA: Diagnosis not present

## 2021-12-17 DIAGNOSIS — F429 Obsessive-compulsive disorder, unspecified: Secondary | ICD-10-CM | POA: Diagnosis not present

## 2021-12-20 ENCOUNTER — Other Ambulatory Visit: Payer: Self-pay | Admitting: Cardiovascular Disease

## 2021-12-24 DIAGNOSIS — E11621 Type 2 diabetes mellitus with foot ulcer: Secondary | ICD-10-CM | POA: Diagnosis not present

## 2021-12-24 DIAGNOSIS — L98491 Non-pressure chronic ulcer of skin of other sites limited to breakdown of skin: Secondary | ICD-10-CM | POA: Diagnosis not present

## 2021-12-24 DIAGNOSIS — L97521 Non-pressure chronic ulcer of other part of left foot limited to breakdown of skin: Secondary | ICD-10-CM | POA: Diagnosis not present

## 2021-12-25 ENCOUNTER — Ambulatory Visit: Payer: Medicare PPO | Admitting: Neurology

## 2021-12-25 ENCOUNTER — Encounter: Payer: Self-pay | Admitting: Neurology

## 2021-12-25 VITALS — BP 103/62 | HR 66 | Ht 66.0 in | Wt 202.2 lb

## 2021-12-25 DIAGNOSIS — G4700 Insomnia, unspecified: Secondary | ICD-10-CM | POA: Diagnosis not present

## 2021-12-25 DIAGNOSIS — E669 Obesity, unspecified: Secondary | ICD-10-CM | POA: Diagnosis not present

## 2021-12-25 DIAGNOSIS — R0683 Snoring: Secondary | ICD-10-CM | POA: Diagnosis not present

## 2021-12-25 DIAGNOSIS — I428 Other cardiomyopathies: Secondary | ICD-10-CM | POA: Diagnosis not present

## 2021-12-25 DIAGNOSIS — G4730 Sleep apnea, unspecified: Secondary | ICD-10-CM | POA: Diagnosis not present

## 2021-12-25 DIAGNOSIS — R351 Nocturia: Secondary | ICD-10-CM

## 2021-12-25 NOTE — Progress Notes (Signed)
Subjective:    Patient ID: Kristin Mack, Kristin Mack is a 81 y.o. female.  HPI    Kristin Age, MD, Kristin Mack [facility] [address], [address] [address] [place], [place] [address]  Dear Dr. Philip Mack,   I saw your patient, Kristin Mack, upon your kind request in my sleep clinic today for initial consultation of her sleep disorder, in particular, concern for underlying obstructive sleep apnea.  The patient is unaccompanied today.  As you know, Kristin Mack is a 81 year old right-handed woman with an underlying medical history of left total hip replacement, radiculopathy, nonischemic cardiomyopathy, carpal tunnel syndrome, anxiety, depression, and obesity, who reports snoring and excessive daytime somnolence.  I reviewed the office note from 10/21/2021.  Her Epworth sleepiness score is 1 out of 24, fatigue severity score is 24 out of 63.  She lives with her son.  She has 2 sons altogether.  She does not have a TV in her bedroom.  She has nocturia about once per average night, denies recurrent morning headaches.  Bedtime is generally around 10 PM.  Every other night or so she takes Risperdal at bedtime.  She sees psychiatry, she has a prescription for lorazepam but rarely takes it.  She does not currently take the Robaxin.  She does take melatonin about 5 mg at night.  Rise time is around 8 AM.  She does not currently drink any alcohol, she is a non-smoker and does not currently consume any caffeine on a regular basis.  They do not have any pets in the household.  She does not have any family history of sleep apnea.  Weight has been more or less stable.  Her Past Medical History Is Significant For: Past Medical History:  Diagnosis Date   Carpal tunnel syndrome    Left Hand   Chest pain    CHF (congestive heart failure) (HCC)    Dilated cardiomyopathy. Her original ejection fraction was 10-20%. Her ejection fraction has improved to 57% by 2009   Coronary artery disease     Depression    Dyslipidemia    Foot drop, left    GERD (gastroesophageal reflux disease)    Headache(784.0)    Hemorrhoids    History of anxiety    Hypertension    Lower back pain    Myocardial infarction ([place])    Obesity    Osteoarthritis    Severe with collapse of the left hip   Single functional kidney    single right kidney,  left kidney is atrophic    Her Past Surgical History Is Significant For: Past Surgical History:  Procedure Laterality Date   BREAST BIOPSY Right    x2   CARDIAC CATHETERIZATION     Ejection Fraction was around 45-50%   HYSTEROSCOPY  09/03/2011   Procedure: HYSTEROSCOPY;  Surgeon: Kristin Bores, MD;  Location: [facility];  Service: Gynecology;  Laterality: N/A;  With Resection of polyp with truclear system   REPLACEMENT TOTAL HIP W/  RESURFACING IMPLANTS  2002   left   TUBAL LIGATION      Her Family History Is Significant For: Family History  Problem Relation Mack of Onset   Stroke Mother    Cancer Sister    Stroke Maternal Grandfather    Breast cancer Neg Hx    Sleep apnea Neg Hx     Her Social History Is Significant For: Social History   Socioeconomic History   Marital status: Divorced    Spouse name: Not  on file   Number of children: Not on file   Years of education: Not on file   Highest education level: Not on file  Occupational History   Not on file  Tobacco Use   Smoking status: Never   Smokeless tobacco: Never  Vaping Use   Vaping Use: Never used  Substance and Sexual Activity   Alcohol use: No    Alcohol/week: 0.0 standard drinks of alcohol   Drug use: No   Sexual activity: Yes    Birth control/protection: Surgical  Other Topics Concern   Not on file  Social History Narrative   Not on file   Social Determinants of Health   Financial Resource Strain: Not on file  Food Insecurity: Not on file  Transportation Needs: Not on file  Physical Activity: Not on file  Stress: Not on file  Social Connections: Not on file     Her Allergies Are:  Allergies  Allergen Reactions   Celecoxib Other (See Comments)   Nickel Itching and Rash  :   Her Current Medications Are:  Outpatient Encounter Medications as of 12/25/2021  Medication Sig   acetaminophen (TYLENOL) 500 MG tablet 1 tab po bid PRN   ACID CONTROLLER MAX ST 20 MG tablet TAKE 1 TABLET BY MOUTH BID   aspirin 81 MG tablet Take 81 mg by mouth daily.    atorvastatin (LIPITOR) 40 MG tablet Take 40 mg by mouth daily.   calcium citrate-vitamin D (CITRACAL+D) 315-200 MG-UNIT tablet Take by mouth.   carvedilol (COREG) 12.5 MG tablet TAKE 1 TABLET BY MOUTH TWICE DAILY WITH A MEAL   clobetasol cream (TEMOVATE) 4.09 % Apply 1 application topically 2 (two) times daily.    estradiol (ESTRACE) 0.1 MG/GM vaginal cream estradiol 0.01% (0.1 mg/gram) vaginal cream apply locally 3 times per week   famotidine (PEPCID) 20 MG tablet Take 1 tablet by mouth 2 (two) times daily.   furosemide (LASIX) 40 MG tablet TAKE 1 TABLET(40 MG) BY MOUTH DAILY   latanoprost (XALATAN) 0.005 % ophthalmic solution Place 1 drop into both eyes at bedtime.   LORazepam (ATIVAN) 0.5 MG tablet Take 0.5 mg by mouth as needed for anxiety.    losartan (COZAAR) 50 MG tablet Take 50 mg by mouth daily. On Monday Wednesday and Friday   melatonin 5 MG TABS Take 1 tablet by mouth at bedtime.   methocarbamol (ROBAXIN) 500 MG tablet TAKE 1 TABLET BY MOUTH EVERY 8 HOURS FOR MUSCLE RELAXATION   metoCLOPramide (REGLAN) 5 MG tablet TAKE 1 TABLET BY MOUTH AT BEDTIME FOR REFLUX   Multiple Vitamins-Minerals (CENTRUM SILVER ULTRA WOMENS) TABS Take 1 tablet by mouth daily.   mupirocin ointment (BACTROBAN) 2 % Apply 1 application topically 2 (two) times daily. (Patient taking differently: Apply 1 application  topically as needed.)   naproxen (NAPROSYN) 500 MG tablet Take by mouth.   NAPROXEN PO Take 625 mg by mouth as needed.   nitroGLYCERIN (NITROSTAT) 0.4 MG SL tablet PLACE 1 TABLET UNDER THE TONGUE AS NEEDED EVERY  5 MINUTES FOR CHEST PAIN   risperiDONE (RISPERDAL) 1 MG tablet Take 1 tablet by mouth as needed.   sodium fluoride (FLUORISHIELD) 1.1 % GEL dental gel PreviDent 5000 Booster Plus 1.1 % dental paste   venlafaxine XR (EFFEXOR-XR) 75 MG 24 hr capsule Take 3 capsules by mouth daily.   Vitamin D, Ergocalciferol, (DRISDOL) 50000 UNITS CAPS Take 50,000 Units by mouth every 30 (thirty) days.    amoxicillin (AMOXIL) 500 MG capsule Take 4  capsules by mouth as needed. 1 hr prior to dental work (Patient not taking: Reported on 05/06/2021)   levofloxacin (LEVAQUIN) 750 MG tablet Take 1 tablet (750 mg total) by mouth daily.   lisinopril (ZESTRIL) 10 MG tablet Take by mouth.   moxifloxacin (VIGAMOX) 0.5 % ophthalmic solution    Multiple Vitamin (MULTI-VITAMIN PO) Take 1 tablet by mouth daily.   pantoprazole (PROTONIX) 40 MG tablet Take by mouth.   prednisoLONE acetate (PRED FORTE) 1 % ophthalmic suspension    predniSONE (DELTASONE) 20 MG tablet 1 tablet (Patient not taking: Reported on 07/22/2021)   PRESCRIPTION MEDICATION Place 1 mL vaginally as directed. inser 1 ml vaginally 2-3 times per week as directed  Estradiol Micronized  0.02% cream (is compounded for patient) (Patient not taking: Reported on 07/22/2021)   No facility-administered encounter medications on file as of 12/25/2021.  :   Review of Systems:  Out of a complete 14 point review of systems, all are reviewed and negative with the exception of these symptoms as listed below:   Review of Systems  Neurological:        Pt here for sleep consult   Pt states snores,hypertension,little fatigue, Pt denies sleep study,CPAP,headaches    ESS:1 FSS:24    Objective:  Neurological Exam  Physical Exam Physical Examination:   Vitals:   12/25/21 0824  BP: 103/62  Pulse: 66    General Examination: The patient is a very pleasant 81 y.o. female in no acute distress. She appears well-developed and well-nourished and well groomed.   HEENT:  Normocephalic, atraumatic, pupils are equal, round and reactive to light, extraocular tracking is good without limitation to gaze excursion or nystagmus noted. Hearing is grossly intact. Face is symmetric with normal facial animation. Speech is clear with no dysarthria noted. There is no hypophonia. There is no lip, neck/head, jaw or voice tremor. Neck is supple with full range of passive and active motion. There are no carotid bruits on auscultation. Oropharynx exam reveals: mild mouth dryness, adequate dental hygiene and mild airway crowding, due to redundant soft palate and wider uvula, tonsils on the smaller side.  Neck circumference 15 three-quarter inches.  Minimal overbite.  Tongue protrudes centrally and palate elevates symmetrically.  Chest: Clear to auscultation without wheezing, rhonchi or crackles noted.  Heart: S1+S2+0, regular and normal without murmurs, rubs or gallops noted.   Abdomen: Soft, non-tender and non-distended.  Extremities: There is trace edema left distal lower extremity, left foot is in an AFO.   Skin: Warm and dry without trophic changes noted.   Musculoskeletal: exam reveals no obvious joint deformities, tenderness or joint swelling or erythema.   Neurologically:  Mental status: The patient is awake, alert and oriented in all 4 spheres. Her immediate and remote memory, attention, language skills and fund of knowledge are appropriate. There is no evidence of aphasia, agnosia, apraxia or anomia. Speech is clear with normal prosody and enunciation. Thought process is linear. Mood is normal and affect is normal.  Cranial nerves II - XII are as described above under HEENT exam.  Motor exam: Normal bulk, strength and tone is noted. There is no obvious tremor. Fine motor skills and coordination: grossly intact.  Cerebellar testing: No dysmetria or intention tremor. There is no truncal or gait ataxia.  Sensory exam: intact to light touch in the upper and lower extremities.   She walks with a single-point cane.  Assessment and Plan:  In summary, Kristin Mack, Kristin Mack is a very pleasant 81 y.o.-year  old female with an underlying medical history of left total hip replacement, radiculopathy, nonischemic cardiomyopathy, carpal tunnel syndrome, anxiety, depression, and obesity, whose history and physical exam are concerning for sleep disordered breathing, supporting a current working diagnosis of unspecified sleep apnea, with the main differential diagnoses of obstructive sleep apnea (OSA) versus upper airway resistance syndrome (UARS) versus central sleep apnea (CSA), or mixed sleep apnea. A laboratory attended sleep study is considered gold standard for evaluation of sleep disordered breathing and is recommended at this time and clinically justified.   I had a long chat with the patient about my findings and the diagnosis of sleep apnea , particularly OSA, its prognosis and treatment options. We talked about medical/conservative treatments, surgical interventions and non-pharmacological approaches for symptom control. I explained, in particular, the risks and ramifications of untreated moderate to severe OSA, especially with respect to developing cardiovascular disease down the road, including congestive heart failure (CHF), difficult to treat hypertension, cardiac arrhythmias (particularly A-fib), neurovascular complications including TIA, stroke and dementia. Even type 2 diabetes has, in part, been linked to untreated OSA. Symptoms of untreated OSA may include (but may not be limited to) daytime sleepiness, nocturia (i.e. frequent nighttime urination), memory problems, mood irritability and suboptimally controlled or worsening mood disorder such as depression and/or anxiety, lack of energy, lack of motivation, physical discomfort, as well as recurrent headaches, especially morning or nocturnal headaches. We talked about the importance of maintaining a healthy lifestyle and  striving for healthy weight. In addition, we talked about the importance of striving for and maintaining good sleep hygiene. I recommended the following at this time: sleep study.  I outlined the differences between a laboratory attended sleep study which is considered more comprehensive and accurate over the option of a home sleep test (HST); the latter may lead to underestimation of sleep disordered breathing in some instances and does not help with diagnosing upper airway resistance syndrome and is not accurate enough to diagnose primary central sleep apnea typically. I explained the different sleep test procedures to the patient in detail and also outlined possible surgical and non-surgical treatment options of OSA, including the use of a pressure airway pressure (PAP) device (ie CPAP, AutoPAP/APAP or BiPAP in certain circumstances), a custom-made dental device (aka oral appliance, which would require a referral to a specialist dentist or orthodontist typically, and is generally speaking not considered a good choice for patients with full dentures or edentulous state), upper airway surgical options, such as traditional UPPP (which is not considered a first-line treatment) or the Inspire device (hypoglossal nerve stimulator, which would involve a referral for consultation with an ENT surgeon, after careful selection, following inclusion criteria). I explained the PAP treatment option to the patient in detail, as this is generally considered first-line treatment.  The patient indicated that she would be willing to try PAP therapy, if the need arises. I explained the importance of being compliant with PAP treatment, not only for insurance purposes but primarily to improve patient's symptoms symptoms, and for the patient's long term health benefit, including to reduce Her cardiovascular risks longer-term.    We will pick up our discussion about the next steps and treatment options after testing.  We will keep  her posted as to the test results by phone call and/or MyChart messaging where possible.  We will plan to follow-up in sleep clinic accordingly as well.  I answered all her questions today and the patient was in agreement.   I encouraged her to call  with any interim questions, concerns, problems or updates or email [place] through [place].  Generally speaking, sleep test authorizations may take up to 2 weeks, sometimes less, sometimes longer, the patient is encouraged to get in touch with [place] if they do not hear back from the sleep lab staff directly within the next 2 weeks.  Thank you very much for allowing me to participate in the care of this nice patient. If I can be of any further assistance to you please do not hesitate to call me at [phone].  Sincerely,   Kristin Age, MD, Kristin Mack

## 2021-12-25 NOTE — Patient Instructions (Signed)

## 2021-12-26 DIAGNOSIS — H1189 Other specified disorders of conjunctiva: Secondary | ICD-10-CM | POA: Diagnosis not present

## 2021-12-26 DIAGNOSIS — H401131 Primary open-angle glaucoma, bilateral, mild stage: Secondary | ICD-10-CM | POA: Diagnosis not present

## 2021-12-30 ENCOUNTER — Ambulatory Visit: Payer: Medicare PPO | Admitting: Podiatry

## 2021-12-30 DIAGNOSIS — L97522 Non-pressure chronic ulcer of other part of left foot with fat layer exposed: Secondary | ICD-10-CM | POA: Diagnosis not present

## 2021-12-30 DIAGNOSIS — M21372 Foot drop, left foot: Secondary | ICD-10-CM

## 2021-12-30 MED ORDER — GENTAMICIN SULFATE 0.1 % EX CREA: 1.0000 | TOPICAL_CREAM | Freq: Two times a day (BID) | CUTANEOUS | 1 refills | Status: DC

## 2021-12-30 MED ORDER — DOXYCYCLINE HYCLATE 100 MG PO TABS: 100.0000 mg | ORAL_TABLET | Freq: Two times a day (BID) | ORAL | 0 refills | Status: DC

## 2021-12-31 DIAGNOSIS — F429 Obsessive-compulsive disorder, unspecified: Secondary | ICD-10-CM | POA: Diagnosis not present

## 2022-01-09 ENCOUNTER — Encounter: Payer: Self-pay | Admitting: Physical Medicine and Rehabilitation

## 2022-01-09 ENCOUNTER — Ambulatory Visit: Payer: Medicare PPO | Admitting: Physical Medicine and Rehabilitation

## 2022-01-09 DIAGNOSIS — R202 Paresthesia of skin: Secondary | ICD-10-CM | POA: Diagnosis not present

## 2022-01-09 NOTE — Progress Notes (Signed)
Chief Complaint  Patient presents with   Toe Pain    Right 4th and 5th pain due to friction and pinching of new brace orthotic per pt x 1 months.     HPI: 81 y.o. female presenting today for evaluation of a wound with redness and swelling to the left fourth toe.  Patient states that this began about 2 months ago from friction of a new AFO that she began wearing.  She has a history of hip replacement about 20 years ago and developed foot drop.  She presents for treatment and evaluation   Past Medical History:  Diagnosis Date   Carpal tunnel syndrome    Left Hand   Chest pain    CHF (congestive heart failure) (HCC)    Dilated cardiomyopathy. Her original ejection fraction was 10-20%. Her ejection fraction has improved to 57% by 2009   Coronary artery disease    Depression    Dyslipidemia    Foot drop, left    GERD (gastroesophageal reflux disease)    Headache(784.0)    Hemorrhoids    History of anxiety    Hypertension    Lower back pain    Myocardial infarction ([place])    Obesity    Osteoarthritis    Severe with collapse of the left hip   Single functional kidney    single right kidney,  left kidney is atrophic    Past Surgical History:  Procedure Laterality Date   BREAST BIOPSY Right    x2   CARDIAC CATHETERIZATION     Ejection Fraction was around 45-50%   HYSTEROSCOPY  09/03/2011   Procedure: HYSTEROSCOPY;  Surgeon: Logan Bores, MD;  Location: [facility];  Service: Gynecology;  Laterality: N/A;  With Resection of polyp with truclear system   REPLACEMENT TOTAL HIP W/  RESURFACING IMPLANTS  2002   left   TUBAL LIGATION      Allergies  Allergen Reactions   Celecoxib Other (See Comments)   Nickel Itching and Rash      Physical Exam: General: The patient is alert and oriented x3 in no acute distress.  Dermatology: Ulcer noted to the distal tip of the left fourth toe measuring approximately 0.4 x 0.4 x 0.1 cm.  No purulence.  No exposed bone muscle tendon  ligament or joint.  Granular wound base.  No malodor.  There is some surrounding erythema and edema associated to the toe.  Please see above noted photo  Vascular: Palpable pedal pulses bilaterally. Capillary refill within normal limits.  Negative for any significant edema or erythema  Neurological: Light touch and protective threshold grossly intact  Musculoskeletal Exam: Mild reducible hammertoe contracture deformity of the lesser digits left foot with muscle weakness.   Assessment: 1.  Ulcer left fourth digit with localized cellulitis   Plan of Care:  1. Patient evaluated.  2.  Medically necessary excisional debridement including subcutaneous tissue was performed using a tissue nipper.  Excisional debridement of the necrotic nonviable tissue down to healthier bleeding viable tissue was performed with postdebridement measurement same as pre- 3.  Refill prescription for doxycycline 100 mg 2 times daily #20 4.  Prescription for gentamicin cream apply 2 times daily with a light Band-Aid or dressing 5.  Discontinue the new AFO.  Resume the old AFO.  Patient believes the new AFO is what caused the wound 6.  Return to clinic 3 weeks     Edrick Kins, DPM [facility]  Dr. Dorathy Daft.  Amalia Hailey, DPM    [address], [place] [address]                Office [phone]  Fax [phone]

## 2022-01-09 NOTE — Progress Notes (Unsigned)
Pt state numbness and weakness in her right hand. Pt stte it hard for her to turn off or on a lap and to hold on to items.   Numeric Pain Rating Scale and Functional Assessment Average Pain 6   In the last MONTH (on 0-10 scale) has pain interfered with the following?  1. General activity like being  able to carry out your everyday physical activities such as walking, climbing stairs, carrying groceries, or moving a chair?  Rating(10)   -BT

## 2022-01-13 DIAGNOSIS — F429 Obsessive-compulsive disorder, unspecified: Secondary | ICD-10-CM | POA: Diagnosis not present

## 2022-01-13 NOTE — Progress Notes (Signed)
Kristin Ina, Kristin Mack - 81 y.o. female MRN [id number]  Date of birth: 18-May-1941  Office Visit Note: Visit Date: 01/09/2022 PCP: Donnajean Lopes, MD Referred by: Sherilyn Cooter, MD  Subjective: Chief Complaint  Patient presents with   Right Hand - Numbness, Weakness   HPI:  Kristin Ina, Kristin Mack is a 81 y.o. female who comes in today at the request of Dr. Sherilyn Cooter for electrodiagnostic study of the Right upper extremities.  Patient is Right hand dominant.  She reports a many year history of right hand pain with numbness and tingling particularly at night and now more constant.  She reports significant weakness of the right hand with trying to do normal activities.  This really does affect her daily living.  She has a history of prior left carpal tunnel release some 20 years ago.  No prior electrodiagnostic studies to review.  No complaints of left-sided symptoms.  No frank radicular type complaints.   ROS Otherwise per HPI.  Assessment & Plan: Visit Diagnoses:    ICD-10-CM   1. Paresthesia of skin  R20.2 NCV with EMG (electromyography)      Plan: Impression: The above electrodiagnostic study is ABNORMAL and reveals evidence of a very severe right median nerve entrapment at the wrist (carpal tunnel syndrome) affecting sensory and motor components. The lesion is characterized by sensory and motor demyelination with evidence of significant axonal injury.   **Interestingly on needle EMG of the right FDI there were some electrical instability findings but with essentially normal nerve conductions.  This could be more of a distal deep ulnar neuropathy or potential for C8 radiculopathy.  There is no significant electrodiagnostic evidence of any other focal nerve entrapment, brachial plexopathy or cervical radiculopathy.   Recommendations: 1.  Follow-up with referring physician. 2.  Continue current management of symptoms. 3.  Suggest surgical evaluation.  Meds &  Orders: No orders of the defined types were placed in this encounter.   Orders Placed This Encounter  Procedures   NCV with EMG (electromyography)    Follow-up: Return in about 2 weeks (around 01/23/2022) for Sherilyn Cooter, MD.   Procedures: No procedures performed  EMG & NCV Findings: Evaluation of the right median motor nerve showed prolonged distal onset latency (8.1 ms), reduced amplitude (0.2 mV), and decreased conduction velocity (Elbow-Wrist, 39 m/s).  The right median (across palm) sensory nerve showed no response (Palm), prolonged distal peak latency (7.5 ms), and reduced amplitude (4.4 V).  All remaining nerves (as indicated in the following tables) were within normal limits.    Needle evaluation of the right abductor pollicis brevis muscle showed decreased insertional activity, widespread spontaneous activity, and diminished recruitment.  The right first dorsal interosseous muscle showed increased insertional activity and slightly increased spontaneous activity.  All remaining muscles (as indicated in the following table) showed no evidence of electrical instability.    Impression: The above electrodiagnostic study is ABNORMAL and reveals evidence of a very severe right median nerve entrapment at the wrist (carpal tunnel syndrome) affecting sensory and motor components. The lesion is characterized by sensory and motor demyelination with evidence of significant axonal injury.   **Interestingly on needle EMG of the right FDI there were some electrical instability findings but with essentially normal nerve conductions.  This could be more of a distal deep ulnar neuropathy or potential for C8 radiculopathy.  There is no significant electrodiagnostic evidence of any other focal nerve entrapment, brachial plexopathy or cervical radiculopathy.   Recommendations: 1.  Follow-up with referring physician. 2.  Continue current management of symptoms. 3.  Suggest surgical  evaluation.  ___________________________ Laurence Spates FAAPMR Board Certified, American Board of Physical Medicine and Rehabilitation    Nerve Conduction Studies Anti Sensory Summary Table   Stim Site NR Peak (ms) Norm Peak (ms) P-T Amp (V) Norm P-T Amp Site1 Site2 Delta-P (ms) Dist (cm) Vel (m/s) Norm Vel (m/s)  Right Median Acr Palm Anti Sensory (2nd Digit)  32.5C  Wrist    *7.5 <3.6 *4.4 >10 Wrist Palm  0.0    Palm *NR  <2.0          Right Radial Anti Sensory (Base 1st Digit)  31.8C  Wrist    2.1 <3.1 30.8  Wrist Base 1st Digit 2.1 0.0    Right Ulnar Anti Sensory (5th Digit)  32.5C  Wrist    3.3 <3.7 15.4 >15.0 Wrist 5th Digit 3.3 14.0 42 >38   Motor Summary Table   Stim Site NR Onset (ms) Norm Onset (ms) O-P Amp (mV) Norm O-P Amp Site1 Site2 Delta-0 (ms) Dist (cm) Vel (m/s) Norm Vel (m/s)  Right Median Motor (Abd Poll Brev)  31.9C  Wrist    *8.1 <4.2 *0.2 >5 Elbow Wrist 5.7 22.5 *39 >50  Elbow    13.8  0.1         Right Ulnar Motor (Abd Dig Min)  31.3C  Wrist    3.4 <4.2 4.8 >3 B Elbow Wrist 3.9 21.0 54 >53  B Elbow    7.3  4.1  A Elbow B Elbow 2.0 11.0 55 >53  A Elbow    9.3  3.4          EMG   Side Muscle Nerve Root Ins Act Fibs Psw Amp Dur Poly Recrt Int Fraser Din Comment  Right Abd Poll Brev Median C8-T1 *Decr *4+ *4+ Nml Nml 0 *Reduced Nml few muaps  Right 1stDorInt Ulnar C8-T1 *CRD *1+ *1+ Nml Nml 0 Nml Nml   Right PronatorTeres Median C6-7 Nml Nml Nml Nml Nml 0 Nml Nml   Right Biceps Musculocut C5-6 Nml Nml Nml Nml Nml 0 Nml Nml   Right Deltoid Axillary C5-6 Nml Nml Nml Nml Nml 0 Nml Nml     Nerve Conduction Studies Anti Sensory Left/Right Comparison   Stim Site L Lat (ms) R Lat (ms) L-R Lat (ms) L Amp (V) R Amp (V) L-R Amp (%) Site1 Site2 L Vel (m/s) R Vel (m/s) L-R Vel (m/s)  Median Acr Palm Anti Sensory (2nd Digit)  32.5C  Wrist  *7.5   *4.4  Wrist Palm     Palm             Radial Anti Sensory (Base 1st Digit)  31.8C  Wrist  2.1   30.8  Wrist Base 1st  Digit     Ulnar Anti Sensory (5th Digit)  32.5C  Wrist  3.3   15.4  Wrist 5th Digit  42    Motor Left/Right Comparison   Stim Site L Lat (ms) R Lat (ms) L-R Lat (ms) L Amp (mV) R Amp (mV) L-R Amp (%) Site1 Site2 L Vel (m/s) R Vel (m/s) L-R Vel (m/s)  Median Motor (Abd Poll Brev)  31.9C  Wrist  *8.1   *0.2  Elbow Wrist  *39   Elbow  13.8   0.1        Ulnar Motor (Abd Dig Min)  31.3C  Wrist  3.4   4.8  B Elbow Wrist  54   B Elbow  7.3   4.1  A Elbow B Elbow  55   A Elbow  9.3   3.4           Waveforms:             Clinical History: No specialty comments available.     Objective:  VS:  HT:    WT:   BMI:     BP:   HR: bpm  TEMP: ( )  RESP:  Physical Exam Musculoskeletal:        General: No swelling, tenderness or deformity.     Comments: Inspection reveals what some classically call a pan deformity with significant atrophy of the right APB and some atrophy of the hand intrinsics and FDI. There is no swelling, color changes, allodynia or dystrophic changes. There is 5 out of 5 strength in the bilateral wrist extension, finger abduction and long finger flexion but right APB is 1 out of 5.. There is decreased sensation to light touch in all dermatomal and peripheral nerve distributions distally in the right hand compared to the left.  There is a negative Hoffmann's test bilaterally.  Skin:    General: Skin is warm and dry.     Findings: No erythema or rash.  Neurological:     General: No focal deficit present.     Mental Status: She is alert and oriented to person, place, and time.     Motor: No weakness or abnormal muscle tone.     Coordination: Coordination normal.  Psychiatric:        Mood and Affect: Mood normal.        Behavior: Behavior normal.      Imaging: No results found.

## 2022-01-13 NOTE — Procedures (Signed)
EMG & NCV Findings: Evaluation of the right median motor nerve showed prolonged distal onset latency (8.1 ms), reduced amplitude (0.2 mV), and decreased conduction velocity (Elbow-Wrist, 39 m/s).  The right median (across palm) sensory nerve showed no response (Palm), prolonged distal peak latency (7.5 ms), and reduced amplitude (4.4 V).  All remaining nerves (as indicated in the following tables) were within normal limits.    Needle evaluation of the right abductor pollicis brevis muscle showed decreased insertional activity, widespread spontaneous activity, and diminished recruitment.  The right first dorsal interosseous muscle showed increased insertional activity and slightly increased spontaneous activity.  All remaining muscles (as indicated in the following table) showed no evidence of electrical instability.    Impression: The above electrodiagnostic study is ABNORMAL and reveals evidence of a very severe right median nerve entrapment at the wrist (carpal tunnel syndrome) affecting sensory and motor components. The lesion is characterized by sensory and motor demyelination with evidence of significant axonal injury.   **Interestingly on needle EMG of the right FDI there were some electrical instability findings but with essentially normal nerve conductions.  This could be more of a distal deep ulnar neuropathy or potential for C8 radiculopathy.  There is no significant electrodiagnostic evidence of any other focal nerve entrapment, brachial plexopathy or cervical radiculopathy.   Recommendations: 1.  Follow-up with referring physician. 2.  Continue current management of symptoms. 3.  Suggest surgical evaluation.  ___________________________ Laurence Spates FAAPMR Board Certified, American Board of Physical Medicine and Rehabilitation    Nerve Conduction Studies Anti Sensory Summary Table   Stim Site NR Peak (ms) Norm Peak (ms) P-T Amp (V) Norm P-T Amp Site1 Site2 Delta-P (ms) Dist  (cm) Vel (m/s) Norm Vel (m/s)  Right Median Acr Palm Anti Sensory (2nd Digit)  32.5C  Wrist    *7.5 <3.6 *4.4 >10 Wrist Palm  0.0    Palm *NR  <2.0          Right Radial Anti Sensory (Base 1st Digit)  31.8C  Wrist    2.1 <3.1 30.8  Wrist Base 1st Digit 2.1 0.0    Right Ulnar Anti Sensory (5th Digit)  32.5C  Wrist    3.3 <3.7 15.4 >15.0 Wrist 5th Digit 3.3 14.0 42 >38   Motor Summary Table   Stim Site NR Onset (ms) Norm Onset (ms) O-P Amp (mV) Norm O-P Amp Site1 Site2 Delta-0 (ms) Dist (cm) Vel (m/s) Norm Vel (m/s)  Right Median Motor (Abd Poll Brev)  31.9C  Wrist    *8.1 <4.2 *0.2 >5 Elbow Wrist 5.7 22.5 *39 >50  Elbow    13.8  0.1         Right Ulnar Motor (Abd Dig Min)  31.3C  Wrist    3.4 <4.2 4.8 >3 B Elbow Wrist 3.9 21.0 54 >53  B Elbow    7.3  4.1  A Elbow B Elbow 2.0 11.0 55 >53  A Elbow    9.3  3.4          EMG   Side Muscle Nerve Root Ins Act Fibs Psw Amp Dur Poly Recrt Int Fraser Din Comment  Right Abd Poll Brev Median C8-T1 *Decr *4+ *4+ Nml Nml 0 *Reduced Nml few muaps  Right 1stDorInt Ulnar C8-T1 *CRD *1+ *1+ Nml Nml 0 Nml Nml   Right PronatorTeres Median C6-7 Nml Nml Nml Nml Nml 0 Nml Nml   Right Biceps Musculocut C5-6 Nml Nml Nml Nml Nml 0 Nml Nml   Right Deltoid Axillary  C5-6 Nml Nml Nml Nml Nml 0 Nml Nml     Nerve Conduction Studies Anti Sensory Left/Right Comparison   Stim Site L Lat (ms) R Lat (ms) L-R Lat (ms) L Amp (V) R Amp (V) L-R Amp (%) Site1 Site2 L Vel (m/s) R Vel (m/s) L-R Vel (m/s)  Median Acr Palm Anti Sensory (2nd Digit)  32.5C  Wrist  *7.5   *4.4  Wrist Palm     Palm             Radial Anti Sensory (Base 1st Digit)  31.8C  Wrist  2.1   30.8  Wrist Base 1st Digit     Ulnar Anti Sensory (5th Digit)  32.5C  Wrist  3.3   15.4  Wrist 5th Digit  42    Motor Left/Right Comparison   Stim Site L Lat (ms) R Lat (ms) L-R Lat (ms) L Amp (mV) R Amp (mV) L-R Amp (%) Site1 Site2 L Vel (m/s) R Vel (m/s) L-R Vel (m/s)  Median Motor (Abd Poll Brev)   31.9C  Wrist  *8.1   *0.2  Elbow Wrist  *39   Elbow  13.8   0.1        Ulnar Motor (Abd Dig Min)  31.3C  Wrist  3.4   4.8  B Elbow Wrist  54   B Elbow  7.3   4.1  A Elbow B Elbow  55   A Elbow  9.3   3.4           Waveforms:

## 2022-01-14 ENCOUNTER — Ambulatory Visit: Payer: Medicare PPO | Admitting: Podiatry

## 2022-01-14 DIAGNOSIS — L97522 Non-pressure chronic ulcer of other part of left foot with fat layer exposed: Secondary | ICD-10-CM | POA: Diagnosis not present

## 2022-01-14 NOTE — Progress Notes (Signed)
Chief Complaint  Patient presents with   folow-up    fu L4th toe red and inflamed    HPI: 81 y.o. female presenting today for follow-up evaluation of a wound with redness and swelling to the left fourth toe caused from friction of a new AFO that she began wearing.  She has a history of hip replacement about 20 years ago and developed foot drop.  Patient completed the oral antibiotics.  She also has been applying the gentamicin cream as instructed.  She has returned to wearing the old AFO  Past Medical History:  Diagnosis Date   Carpal tunnel syndrome    Left Hand   Chest pain    CHF (congestive heart failure) (HCC)    Dilated cardiomyopathy. Her original ejection fraction was 10-20%. Her ejection fraction has improved to 57% by 2009   Coronary artery disease    Depression    Dyslipidemia    Foot drop, left    GERD (gastroesophageal reflux disease)    Headache(784.0)    Hemorrhoids    History of anxiety    Hypertension    Lower back pain    Myocardial infarction ([place])    Obesity    Osteoarthritis    Severe with collapse of the left hip   Single functional kidney    single right kidney,  left kidney is atrophic    Past Surgical History:  Procedure Laterality Date   BREAST BIOPSY Right    x2   CARDIAC CATHETERIZATION     Ejection Fraction was around 45-50%   HYSTEROSCOPY  09/03/2011   Procedure: HYSTEROSCOPY;  Surgeon: Logan Bores, MD;  Location: [facility];  Service: Gynecology;  Laterality: N/A;  With Resection of polyp with truclear system   REPLACEMENT TOTAL HIP W/  RESURFACING IMPLANTS  2002   left   TUBAL LIGATION      Allergies  Allergen Reactions   Celecoxib Other (See Comments)   Nickel Itching and Rash    Physical Exam: General: The patient is alert and oriented x3 in no acute distress.  Dermatology: The ulcer to the left fourth toe has healed.  Complete reepithelialization has occurred.  There is some slight callus tissue around the area but there  is no open wound.  Edema and erythema is resolved   Vascular: Palpable pedal pulses bilaterally. Capillary refill within normal limits.  Negative for any significant edema or erythema  Neurological: Light touch and protective threshold grossly intact  Musculoskeletal Exam: Mild reducible hammertoe contracture deformity of the lesser digits left foot with muscle weakness.   Assessment: 1.  Ulcer left fourth digit with localized cellulitis; resolved   Plan of Care:  1. Patient evaluated.  2.  Light debridement of the freshly healed area was performed using a 312 scalpel.  No bleeding.  No open wound. 3.  Continue wearing old AFO.  Discontinue the new AFO.  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  Return to clinic as needed   Edrick Kins, DPM [facility]  Dr. Edrick Kins, DPM    [address], [place] [address]                Office [phone]  Fax (  [phone]    

## 2022-01-15 ENCOUNTER — Ambulatory Visit: Payer: Medicare PPO | Admitting: Orthopedic Surgery

## 2022-01-15 DIAGNOSIS — G5601 Carpal tunnel syndrome, right upper limb: Secondary | ICD-10-CM | POA: Diagnosis not present

## 2022-01-15 NOTE — Progress Notes (Signed)
Office Visit Note   Patient: Kristin Ina, Kristin Mack           Date of Birth: 06/20/1941           MRN: [id number] Visit Date: 01/15/2022              Requested by: Donnajean Lopes, MD [place],  [place] [address] PCP: Donnajean Lopes, MD   Assessment & Plan: Visit Diagnoses:  1. Carpal tunnel syndrome of right wrist     Plan: We reviewed patient's recent EMG/nerve conduction study which suggest severe right carpal tunnel syndrome.  We discussed the nature of her carpal tunnel syndrome including the significant thenar atrophy.  We discussed that the best chance for recovery would be a carpal tunnel release.  She does have some motor unit action potentials which suggest possibility of reinnervation of the thenar muscle.  We reviewed use of surgery including bleeding, infection, damage to the median nerve or its branches, incomplete symptom relief, need for additional procedures.  Patient like to proceed with a carpal tunnel release.  The surgical date and time will be confirmed with the patient.  Follow-Up Instructions: No follow-ups on file.   Orders:  No orders of the defined types were placed in this encounter.  No orders of the defined types were placed in this encounter.     Procedures: No procedures performed   Clinical Data: No additional findings.   Subjective: Chief Complaint  Patient presents with   Right Hand - Follow-up    EMG/NCS Review    This is a 81 year old female who presents with continued numbness and paresthesias involving the right hand.  Is been going on for at least 10 to 12 years.  She has baseline numbness in all of her fingers in the right hand except for the small finger.  She has nocturnal symptoms in which she wakes with her hand painful and numb.  She has significant weakness of her hand.  She has significant thenar atrophy.  She has difficulty opening jars or lids secondary to numbness and weakness.  Her recent EMG/nerve  conduction study on 01/09/2022 suggested a very severe right carpal tunnel syndrome.     Review of Systems   Objective: Vital Signs: There were no vitals taken for this visit.  Physical Exam Constitutional:      Appearance: Normal appearance.  Cardiovascular:     Rate and Rhythm: Normal rate.     Pulses: Normal pulses.  Pulmonary:     Effort: Pulmonary effort is normal.  Skin:    General: Skin is warm and dry.     Capillary Refill: Capillary refill takes less than 2 seconds.  Neurological:     Mental Status: She is alert.     Right Hand Exam   Tenderness  The patient is experiencing no tenderness.   Other  Erythema: absent Sensation: decreased Pulse: present  Comments:  Significant thenar atrophy with no abduction or opposition.  Positive Phalen and Durkan signs.  Questionable Tinel sign.       Specialty Comments:  No specialty comments available.  Imaging: No results found.   PMFS History: Patient Active Problem List   Diagnosis Date Noted   Numbness of right hand 12/04/2021   Right hand weakness 12/04/2021   Bilateral hearing loss 11/04/2021   Hx of compression fracture of spine 08/20/2021   Senile osteoporosis 08/20/2021   Osteoarthritis of knee 08/06/2021   Unsteady gait 08/06/2021   At high  risk for falls 07/24/2021   Low bone mass 07/24/2021   Pain in right knee 10/12/2019   Non-pressure chronic ulcer of other part of left foot with fat layer exposed ([place]) 11/03/2017   Encounter for general adult medical examination without abnormal findings 05/11/2017   Acute bronchitis 05/05/2017   Injury 08/21/2016   Complete tear of right rotator cuff 06/18/2016   Localized, primary osteoarthritis of shoulder region, right 06/18/2016   Coronary artery disease involving native coronary artery of native heart without angina pectoris 05/27/2016   Neck pain 02/19/2016   Other slipping, tripping and stumbling without falling, initial encounter 02/19/2016    Pain in thoracic spine 02/19/2016   Posttraumatic headache 02/19/2016   Height loss 01/09/2016   History of total left hip arthroplasty 01/09/2016   Left foot drop 01/09/2016   Postmenopausal status 01/09/2016   Hypokalemia 03/30/2013   Abscess of axilla, right 08/31/2011   Cough 07/29/2011   Sinusitis 07/29/2011   Acquired hallux valgus 05/11/2011   Type 2 diabetes mellitus with foot ulcer (CODE) ([place]) 05/11/2011   Microscopic hematuria 03/18/2011   Psychophysiologic insomnia 63/14/9702   Chronic systolic CHF (congestive heart failure) ([place]) 03/11/2011   Hyperlipidemia 03/11/2011   Carpal tunnel syndrome    GERD (gastroesophageal reflux disease)    Other specified abnormal findings of blood chemistry 11/22/2009   Vitamin D deficiency 11/22/2009   Rash and other nonspecific skin eruption 11/14/2009   Atrophic vaginitis 11/12/2009   Depressive disorder, not elsewhere classified 11/12/2009   Onychomycosis due to dermatophyte 11/12/2009   Arthropathy 11/07/2009   Congestive heart failure ([place]) 11/07/2009   Low back pain 11/07/2009   Other acquired deformity of ankle and foot(736.79) 11/07/2009   Anxiety disorder 09/20/2009   Cardiomyopathy ([place]) 09/20/2009   Essential hypertension 09/20/2009   Nocturia 09/20/2009   Abnormal weight gain 09/20/2009   Pain in left hip 09/20/2009   Past Medical History:  Diagnosis Date   Carpal tunnel syndrome    Left Hand   Chest pain    CHF (congestive heart failure) (HCC)    Dilated cardiomyopathy. Her original ejection fraction was 10-20%. Her ejection fraction has improved to 57% by 2009   Coronary artery disease    Depression    Dyslipidemia    Foot drop, left    GERD (gastroesophageal reflux disease)    Headache(784.0)    Hemorrhoids    History of anxiety    Hypertension    Lower back pain    Myocardial infarction ([place])    Obesity    Osteoarthritis    Severe with collapse of the left hip   Single functional kidney    single  right kidney,  left kidney is atrophic    Family History  Problem Relation Age of Onset   Stroke Mother    Cancer Sister    Stroke Maternal Grandfather    Breast cancer Neg Hx    Sleep apnea Neg Hx     Past Surgical History:  Procedure Laterality Date   BREAST BIOPSY Right    x2   CARDIAC CATHETERIZATION     Ejection Fraction was around 45-50%   HYSTEROSCOPY  09/03/2011   Procedure: HYSTEROSCOPY;  Surgeon: Logan Bores, MD;  Location: [facility];  Service: Gynecology;  Laterality: N/A;  With Resection of polyp with truclear system   REPLACEMENT TOTAL HIP W/  RESURFACING IMPLANTS  2002   left   TUBAL LIGATION     Social History   Occupational History  Not on file  Tobacco Use   Smoking status: Never   Smokeless tobacco: Never  Vaping Use   Vaping Use: Never used  Substance and Sexual Activity   Alcohol use: No    Alcohol/week: 0.0 standard drinks of alcohol   Drug use: No   Sexual activity: Yes    Birth control/protection: Surgical

## 2022-01-21 ENCOUNTER — Telehealth: Payer: Self-pay | Admitting: Orthopedic Surgery

## 2022-01-21 DIAGNOSIS — Z85828 Personal history of other malignant neoplasm of skin: Secondary | ICD-10-CM | POA: Diagnosis not present

## 2022-01-21 DIAGNOSIS — L570 Actinic keratosis: Secondary | ICD-10-CM | POA: Diagnosis not present

## 2022-01-21 NOTE — Telephone Encounter (Signed)
I spoke with Dr. Rush Barer today about scheduling surgery for right carpal tunnel release. Patient was unaware doctor will be leaving [place] at the end of the month. She would like for doctor to call her to discuss. Patient is torn as to schedule surgery or not, and hoping talking to doctor will help her make up he mind. Please call patient to discuss.

## 2022-01-22 ENCOUNTER — Ambulatory Visit: Payer: Medicare PPO | Admitting: Podiatry

## 2022-01-22 ENCOUNTER — Telehealth: Payer: Self-pay | Admitting: Neurology

## 2022-01-22 NOTE — Telephone Encounter (Signed)
[employer] pending uploaded notes on the portal  

## 2022-01-23 DIAGNOSIS — H903 Sensorineural hearing loss, bilateral: Secondary | ICD-10-CM | POA: Diagnosis not present

## 2022-01-26 NOTE — Telephone Encounter (Signed)
Patient left a message 01/23/22 stating she has decided to hold of on surgery for now. Will explore other options if necessary in the future.

## 2022-01-27 NOTE — Telephone Encounter (Signed)
checked status on the portal it is still pending

## 2022-01-28 DIAGNOSIS — F429 Obsessive-compulsive disorder, unspecified: Secondary | ICD-10-CM | POA: Diagnosis not present

## 2022-02-02 DIAGNOSIS — L97521 Non-pressure chronic ulcer of other part of left foot limited to breakdown of skin: Secondary | ICD-10-CM | POA: Diagnosis not present

## 2022-02-02 DIAGNOSIS — L98491 Non-pressure chronic ulcer of skin of other sites limited to breakdown of skin: Secondary | ICD-10-CM | POA: Diagnosis not present

## 2022-02-02 NOTE — Telephone Encounter (Signed)
NPSG- [employer] Josem Kaufmann: [id number] (exp. 01/22/22 to 02/21/22)  Patient is scheduled at [facility] for 02/18/22 at 8 pm.  Mailed packet to the patient.

## 2022-02-03 DIAGNOSIS — H5213 Myopia, bilateral: Secondary | ICD-10-CM | POA: Diagnosis not present

## 2022-02-03 DIAGNOSIS — H401111 Primary open-angle glaucoma, right eye, mild stage: Secondary | ICD-10-CM | POA: Diagnosis not present

## 2022-02-03 DIAGNOSIS — H401122 Primary open-angle glaucoma, left eye, moderate stage: Secondary | ICD-10-CM | POA: Diagnosis not present

## 2022-02-05 ENCOUNTER — Ambulatory Visit: Payer: Medicare PPO | Admitting: Podiatry

## 2022-02-05 ENCOUNTER — Encounter: Payer: Self-pay | Admitting: Podiatry

## 2022-02-05 DIAGNOSIS — M79676 Pain in unspecified toe(s): Secondary | ICD-10-CM | POA: Diagnosis not present

## 2022-02-05 DIAGNOSIS — B351 Tinea unguium: Secondary | ICD-10-CM | POA: Diagnosis not present

## 2022-02-05 DIAGNOSIS — L97521 Non-pressure chronic ulcer of other part of left foot limited to breakdown of skin: Secondary | ICD-10-CM

## 2022-02-05 NOTE — Progress Notes (Signed)
Kristin Mack presents today for follow-up of ulceration fourth toe left foot as well as a previous wound to her hallux left foot.  She states that they do look better but she is concerned that she is going to get an infection of this fourth toe and that losing her toe.  States that she is seeing Dr. Amalia Hailey for this to put her on doxycycline and gentamicin.  She states that it seems to be doing little better has seen her nurse practitioner 2-3 times and she looks at it and will treatment a little bit.  She states she denies fever chills nausea vomiting muscle aches pains.  Objective: Vital signs stable tellurian x3 neurovascular status is unchanged.  Hallux does not demonstrate any open wound.  Fourth toe left foot does demonstrate hammertoe deformity with a distal clavus that is superficially ulcerated once the clavus was removed.  The ulceration measures approximately 1.0 mm to 1.5 mm no purulence no malodor there is no cellulitic process involved here.  She has pain in her toenails and that they are long thick yellow dystrophic clinical mycotic.  Assessment: History of ulceration to the hallux which is healed.  She has had a history of cellulitis with an ulceration of the fourth toe left foot which is under good control.  Toenails are long thick yellow dystrophic clinical mycotic which I debrided today.  Plan: Debrided nails 1 through 5 today debrided ulcer fourth toe left foot today she will continue her doxycycline she will continue gentamicin application.  Provided her with silicone padding and a buttress pad or sulcus pad.  I will follow-up with her in a couple weeks to make sure she is doing well.  Questions or concerns she will notify us immediately.

## 2022-02-11 DIAGNOSIS — F429 Obsessive-compulsive disorder, unspecified: Secondary | ICD-10-CM | POA: Diagnosis not present

## 2022-02-18 ENCOUNTER — Ambulatory Visit (INDEPENDENT_AMBULATORY_CARE_PROVIDER_SITE_OTHER): Payer: Medicare PPO | Admitting: Neurology

## 2022-02-18 DIAGNOSIS — R351 Nocturia: Secondary | ICD-10-CM

## 2022-02-18 DIAGNOSIS — R0683 Snoring: Secondary | ICD-10-CM

## 2022-02-18 DIAGNOSIS — R9431 Abnormal electrocardiogram [ECG] [EKG]: Secondary | ICD-10-CM

## 2022-02-18 DIAGNOSIS — G4761 Periodic limb movement disorder: Secondary | ICD-10-CM

## 2022-02-18 DIAGNOSIS — G4730 Sleep apnea, unspecified: Secondary | ICD-10-CM

## 2022-02-18 DIAGNOSIS — G4720 Circadian rhythm sleep disorder, unspecified type: Secondary | ICD-10-CM

## 2022-02-18 DIAGNOSIS — G4700 Insomnia, unspecified: Secondary | ICD-10-CM

## 2022-02-18 DIAGNOSIS — E669 Obesity, unspecified: Secondary | ICD-10-CM

## 2022-02-18 DIAGNOSIS — I428 Other cardiomyopathies: Secondary | ICD-10-CM

## 2022-02-24 DIAGNOSIS — Z01419 Encounter for gynecological examination (general) (routine) without abnormal findings: Secondary | ICD-10-CM | POA: Diagnosis not present

## 2022-02-25 DIAGNOSIS — F429 Obsessive-compulsive disorder, unspecified: Secondary | ICD-10-CM | POA: Diagnosis not present

## 2022-03-02 ENCOUNTER — Encounter: Payer: Self-pay | Admitting: Podiatry

## 2022-03-02 ENCOUNTER — Ambulatory Visit: Payer: Medicare PPO | Admitting: Podiatry

## 2022-03-02 DIAGNOSIS — L97521 Non-pressure chronic ulcer of other part of left foot limited to breakdown of skin: Secondary | ICD-10-CM

## 2022-03-02 NOTE — Procedures (Signed)
[facility] POLYSOMNOGRAPHY  INTERPRETATION REPORT   STUDY DATE:  02/18/2022     PATIENT NAME:  Kristin Mack         DATE OF BIRTH:  1940/11/16  PATIENT ID:  [id number]    TYPE OF STUDY:  PSG  READING PHYSICIAN: Star Age, MD REFERRED BY: Dr. Rosalia Hammers TECHNICIAN: Gaylyn Cheers, RPSGT   HISTORY: 81 year old right-handed woman with an underlying medical history of left total hip replacement, radiculopathy, nonischemic cardiomyopathy, carpal tunnel syndrome, anxiety, depression, and obesity, who reports snoring and excessive daytime somnolence. ?I reviewed the office note from 10/21/2021. ?Her Epworth sleepiness score is 1 out of 24, fatigue severity score is 24 out of 63. Height: 66 in Weight: 202 lb (BMI 32) Neck Size: 16 in.   MEDICATIONS: Tylenol, Acid controller max, Lipitor, Citracal+D, Coreg, Aspirin, Temovate, Estrace, Pepcid, Lasix, Xalatean, Ativan, Cozaar, Melatonin, Robaxin, Reglan, Centrum silver, Bactroban, Naprosyn, Naproxen, Nitrostat, Risperdal, Flourishield, Effexor-XR, Vitamin D, Amoxil, Levaquin, Zestril, Vigamox, Multivitamin, Protonix, Pred forte, Deltasone, Estradoil TECHNICAL DESCRIPTION: A registered sleep technologist ( RPSGT)  was in attendance for the duration of the recording.  Data collection, scoring, video monitoring, and reporting were performed in compliance with the AASM Manual for the Scoring of Sleep and Associated Events; (Hypopnea is scored based on the criteria listed in Section VIII D. 1b in the AASM Manual V2.6 using a 4% oxygen desaturation rule or Hypopnea is scored based on the criteria listed in Section VIII D. 1a in the AASM Manual V2.6 using 3% oxygen desaturation and /or arousal rule).   SLEEP CONTINUITY AND SLEEP ARCHITECTURE:  Lights-out was at 21:08: and lights-on at  05:25: a total recording time (TRT) of 8 hours, 14 minutes. Total sleep time ( TST) was 388.0 minutes with a mildly decreased sleep  efficiency at 78.5%.   BODY POSITION:  TST was divided  between the following sleep positions: 100.0% supine;  0.0% lateral;  0% prone. Duration of total sleep and percent of total sleep in their respective position is as follows: supine 388 minutes (100%), non-supine 0 minutes (0%); right 00 minutes (0%), left 00 minutes (0%), and prone 00 minutes (0%).  Total supine REM sleep time was 30 minutes (100% of total REM sleep). Sleep latency was increased at 34.5 minutes.  REM sleep latency was markedly delayed at 387.0 minutes. Of the total sleep time, the percentage of stage N1 sleep was 7.2%, stage N2 sleep was 85%, which is markedly increased, stage N3 sleep was 0.3%, and REM sleep was 7.7%, which is reduced. There were 3 Stage R periods observed on this study night, 20 awakenings (i.e. transitions to Stage W from any sleep stage), and 67 total stage transitions. Wake after sleep onset (WASO) time accounted for 71.5 minutes with minimal to mild sleep fragmentation noted.  RESPIRATORY MONITORING:  Based on CMS criteria (using a 4% oxygen desaturation rule for scoring hypopneas), there were 9 apneas (7 obstructive; 0 central; 2 mixed), and 7 hypopneas.  Apnea index was 1.4. Hypopnea index was 1.1. The apnea-hypopnea index was 2.5/hour overall (2.5 supine, 0 non-supine; 6.0 REM, 6.0 supine REM). There were 0 respiratory effort-related arousals (RERAs).  The RERA index was 0 events/h. Total respiratory disturbance index (RDI) was 2.5 events/h. RDI results showed: supine RDI  2.5 /h; non-supine RDI 0.0 /h; REM RDI 6.0 /h, supine REM RDI 6.0 /h.   Based on AASM criteria (using a 3% oxygen desaturation and /or arousal rule for scoring hypopneas), there were  9 apneas (7 obstructive; 0 central; 2 mixed), and 12 hypopneas. Apnea index was 1.4. Hypopnea index was 1.9. The apnea-hypopnea index was 3.2 overall (3.2 supine, 0 non-supine; 6.0 REM, 6.0 supine REM).  There were 0 respiratory effort-related arousals  (RERAs).  The RERA index was 0 events/h. Total respiratory disturbance index (RDI) was 3.2 events/h. RDI results showed: supine RDI  3.2 /h; non-supine RDI 0.0 /h; REM RDI 6.0 /h, supine REM RDI 6.0 /h.  OXIMETRY: Oxyhemoglobin Saturation Nadir during sleep was at  88%) from a mean of 95%.  Of the Total sleep time (TST)   hypoxemia (=<88%) was present for  1.0 minutes, or 0.3% of total sleep time.  LIMB MOVEMENTS: There were 353 periodic limb movements of sleep (54.6/hr), of which 6 (0.9/hr) were associated with an arousal. AROUSAL: There were 59 arousals in total, for an arousal index of 9 arousals/hour.  Of these, 3 were identified as respiratory-related arousals (0 /h), 6 were PLM-related arousals (1 /h), and 54 were non-specific arousals (8 /h). There were 0 occurrences of Cheyne Stokes breathing.  Snoring was classified as intermittent and mild to moderate. EEG:  The EEG was of normal amplitude and frequency, with symmetric manifestation of sleep stages. EKG: The electrocardiogram showed normal sinus rhythm with rare PVCs noted. The average heart rate during sleep was 62 bpm.  The heart rate during sleep varied between a minimum of N/A and  a maximum of  82 bpm. AUDIO and VIDEO: The video and audio analysis did not show any abnormal or unusual behaviors, movements, phonations or vocalizations. Post study, the patient indicated, that sleep was the same as usual.  IMPRESSION: 1. Primary snoring 2. Periodic limb movement disorder (PLMD) 3. Dysfunctions associated with sleep stages or arousal from sleep 4. Non-specific abnormal EKG RECOMMENDATIONS: 1. This study does not demonstrate any significant obstructive or central sleep disordered breathing with an AHI of less than 5/hour. Her total AHI is 2.5/hour, O2 nadir of 88%. Intermittent mild to moderate snoring was noted. Treatment with a positive airway pressure device, such as CPAP or autoPAP is not indicated. Weight loss may aid in reducing her  snoring.  2. Severe PLMs (periodic limb movements of sleep) were noted during this study with no significant arousals; clinical correlation is recommended. Medication effect from the antidepressant medication should be considered.   3. This study shows sleep fragmentation and abnormal sleep stage percentages; these are nonspecific findings and per se do not signify an intrinsic sleep disorder or a cause for the patient's sleep-related symptoms. Causes include (but are not limited to) the first night effect of the sleep study, circadian rhythm disturbances, medication effect or an underlying mood disorder or medical problem.  4. The study showed rare PVCs on EKG; clinical correlation is recommended. The patient is followed by cardiology.  5. The patient should be cautioned not to drive, work at heights, or operate dangerous or heavy equipment when tired or sleepy. Review and reiteration of good sleep hygiene measures should be pursued with any patient. 6. The patient will be advised to follow up with the referring provider, who will be notified of the test results.   I certify that I have reviewed the entire raw data recording prior to the issuance of this report in accordance with the Standards of Accreditation of the American Academy of Sleep Medicine (AASM). Star Age,  MD, PhD

## 2022-03-02 NOTE — Progress Notes (Signed)
She presents today for follow-up of her ulceration fourth toe left foot.  States that is doing somewhat better able to get in the pool so continue to exercise.  She states that there has not been any drainage of the toe dressing.  She continues to wear her buttress pads.  Objective: Vital signs are stable she is alert and oriented x3.  Pulses are palpable.  Hallux valgus deformity is noted ulceration on the hallux is not open it appears to be completely healed.  Fourth toe left foot does still demonstrate some postinflammatory hyperpigmentation but the distal ulceration has gone on to heal 100%.  Assessment: Well-healing ulcerations.  Plan: Debrided the wound today.  Placed new buttress pads I will follow-up with her on an as-needed basis.  She will follow-up with me regularly in about 3 months

## 2022-03-03 ENCOUNTER — Telehealth: Payer: Self-pay | Admitting: Neurology

## 2022-03-03 ENCOUNTER — Ambulatory Visit: Payer: Medicare PPO | Admitting: Podiatry

## 2022-03-03 DIAGNOSIS — D2272 Melanocytic nevi of left lower limb, including hip: Secondary | ICD-10-CM | POA: Diagnosis not present

## 2022-03-03 DIAGNOSIS — Z85828 Personal history of other malignant neoplasm of skin: Secondary | ICD-10-CM | POA: Diagnosis not present

## 2022-03-03 DIAGNOSIS — L821 Other seborrheic keratosis: Secondary | ICD-10-CM | POA: Diagnosis not present

## 2022-03-03 DIAGNOSIS — D692 Other nonthrombocytopenic purpura: Secondary | ICD-10-CM | POA: Diagnosis not present

## 2022-03-03 DIAGNOSIS — D225 Melanocytic nevi of trunk: Secondary | ICD-10-CM | POA: Diagnosis not present

## 2022-03-03 DIAGNOSIS — D2271 Melanocytic nevi of right lower limb, including hip: Secondary | ICD-10-CM | POA: Diagnosis not present

## 2022-03-03 DIAGNOSIS — Z85820 Personal history of malignant melanoma of skin: Secondary | ICD-10-CM | POA: Diagnosis not present

## 2022-03-03 NOTE — Telephone Encounter (Signed)
I called patient to discuss. No answer, left a message asking her to call us back. If patient calls back another day please route call to POD 4.

## 2022-03-03 NOTE — Telephone Encounter (Signed)
I called patient.  I discussed her sleep study results and recommendations.  Patient will follow-up with her PCP as planned.  Patient verbalized understanding of results.  Patient did not have any questions or concerns at this time.

## 2022-03-03 NOTE — Telephone Encounter (Signed)
Pt returned call to nurse. Requesting nurse call-back

## 2022-03-03 NOTE — Telephone Encounter (Signed)
-----   Message from Star Age, MD sent at 03/02/2022  5:33 PM EDT ----- Patient referred by Dr. Philip Aspen, seen by me on 12/25/21, diagnostic PSG on 02/18/22.   Please call and notify the patient that the recent sleep study did not show any significant obstructive sleep apnea with a total AHI at 2.5/hour, O2 nadir of 88%. Intermittent mild to moderate snoring was noted. Treatment with a positive airway pressure device, such as CPAP or autoPAP is not indicated. Weight loss may aid in reducing her snoring. Please inform patient that she can FU with her PCP as scheduled.   Thanks,  Star Age, MD, PhD [facility])

## 2022-03-03 NOTE — Telephone Encounter (Signed)
Pt is calling. Requesting a nurse call and go over sleep study results.

## 2022-03-09 ENCOUNTER — Ambulatory Visit: Payer: Medicare PPO | Admitting: Podiatry

## 2022-03-11 DIAGNOSIS — F429 Obsessive-compulsive disorder, unspecified: Secondary | ICD-10-CM | POA: Diagnosis not present

## 2022-03-19 ENCOUNTER — Other Ambulatory Visit: Payer: Self-pay | Admitting: Cardiovascular Disease

## 2022-03-24 NOTE — Telephone Encounter (Signed)
She can increase her Melatonin to up to 10 mg at night, but should be mindful that she can have grogginess from it in the middle of the night if she has to get up and use the restroom.

## 2022-03-24 NOTE — Telephone Encounter (Signed)
Pt said, wakes up and cannot go back to sleep after using the bathroom.  Would like a call from the nurse to discuss how to get back to sleep after going to the bathroom.

## 2022-03-24 NOTE — Telephone Encounter (Signed)
I called the pt and discussed she can increase her Melatonin, per Dr Rexene Alberts, up to 10 mg at night but to watch for grogginess upon wakening during the night. Pt understands she doesn't have to jump from 5 mg to 10 mg, could try a dose in between. Also discussed ways to create a bedroom environment conducive to sleeping such as avoiding lights, television, avoiding alcohol close to bedtime, not playing on cell phone/ipad in bed. The patient's questions were answered.  She says she does not always have to use the restroom whenever she wakes up so we discussed that if she does not have to go then the less stimulation and less lights there are, the better off she be trying to fall back asleep.  However if she feels like she needs to use the restroom then it would be better for her to go ahead so she can get comfortable and try to go back to sleep.

## 2022-03-25 DIAGNOSIS — M79641 Pain in right hand: Secondary | ICD-10-CM | POA: Insufficient documentation

## 2022-03-25 DIAGNOSIS — G5601 Carpal tunnel syndrome, right upper limb: Secondary | ICD-10-CM | POA: Diagnosis not present

## 2022-03-25 DIAGNOSIS — M13841 Other specified arthritis, right hand: Secondary | ICD-10-CM | POA: Diagnosis not present

## 2022-03-30 ENCOUNTER — Telehealth: Payer: Self-pay | Admitting: *Deleted

## 2022-03-30 NOTE — Telephone Encounter (Signed)
Primary Cardiologist:Philip Nahser, MD  Chart reviewed as part of pre-operative protocol coverage. Because of Ngan Qualls Estrin, PhD's past medical history and time since last visit, he/she will require a follow-up visit in order to better assess preoperative cardiovascular risk.  Pre-op covering staff: - Please schedule appointment and call patient to inform them. She has an appointment on 05/06/22 with Dr. Acie Fredrickson. I left her a message to ask that she call back to schedule sooner ov due to surgical date is 05/05/22 and she has not been seen since 04/2021. - Please contact requesting surgeon's office via preferred method (i.e, phone, fax) to inform them of need for appointment prior to surgery.  If applicable, this message will also be routed to pharmacy pool and/or primary cardiologist for input on holding anticoagulant/antiplatelet agent as requested below so that this information is available at time of patient's appointment.   Emmaline Life, NP-C  03/30/2022, 11:38 AM [address] Office [phone] Fax [phone]

## 2022-03-30 NOTE — Telephone Encounter (Signed)
   Pre-operative Risk Assessment    Patient Name: Kristin CLOSSER, Kristin Mack  DOB: Jun 08, 1941 MRN: [id number]      Request for Surgical Clearance    Procedure:  RIGHT CARPAL TUNNEL RELEASE OPEN IN NATURE WITH TENDON TRANSFER/PALMARIS TO ADBUCTOR POLLICIS BREVIS TENDON TRANSFER  Date of Surgery:  Clearance 05/05/22                                 Surgeon:  DR. Roseanne Kaufman Surgeon's Group or Practice Name:  [facility] Phone number:  [phone] Fax number:  [phone]   Type of Clearance Requested:   - Pharmacy:  Hold Aspirin NOT INDICATED HOW LONG   Type of Anesthesia:   ANESTHESIA - BLOCK WITH IV SEDATION   Additional requests/questions:    Astrid Divine   03/30/2022, 11:19 AM

## 2022-03-30 NOTE — Telephone Encounter (Signed)
Routed to requesting surgeon's office to make them aware of information below.

## 2022-03-31 ENCOUNTER — Encounter: Payer: Self-pay | Admitting: Cardiovascular Disease

## 2022-03-31 ENCOUNTER — Ambulatory Visit: Payer: Medicare PPO | Attending: Cardiovascular Disease | Admitting: Cardiovascular Disease

## 2022-03-31 VITALS — BP 108/50 | HR 65 | Ht 66.0 in | Wt 196.4 lb

## 2022-03-31 DIAGNOSIS — I2510 Atherosclerotic heart disease of native coronary artery without angina pectoris: Secondary | ICD-10-CM | POA: Diagnosis not present

## 2022-03-31 DIAGNOSIS — I5042 Chronic combined systolic (congestive) and diastolic (congestive) heart failure: Secondary | ICD-10-CM | POA: Diagnosis not present

## 2022-03-31 NOTE — Telephone Encounter (Signed)
Pt is scheduled to see Dr. Acie Fredrickson, today, 03/31/22.  Clearance will be addressed at that time.  Will route to the requesting surgeon's office to make them aware.

## 2022-03-31 NOTE — Patient Instructions (Signed)
Medication Instructions:  NO CHANGES *If you need a refill on your cardiac medications before your next appointment, please call your pharmacy*   Lab Work: NONE If you have labs (blood work) drawn today and your tests are completely normal, you will receive your results only by: MyChart Message (if you have MyChart) OR A paper copy in the mail If you have any lab test that is abnormal or we need to change your treatment, we will call you to review the results.   Testing/Procedures: NONE   Follow-Up: At [facility], you and your health needs are our priority.  As part of our continuing mission to provide you with exceptional heart care, we have created designated Provider Care Teams.  These Care Teams include your primary Cardiologist (physician) and Advanced Practice Providers (APPs -  Physician Assistants and Nurse Practitioners) who all work together to provide you with the care you need, when you need it.  We recommend signing up for the patient portal called "MyChart".  Sign up information is provided on this After Visit Summary.  MyChart is used to connect with patients for Virtual Visits (Telemedicine).  Patients are able to view lab/test results, encounter notes, upcoming appointments, etc.  Non-urgent messages can be sent to your provider as well.   To learn more about what you can do with MyChart, go to [email or web address].    Your next appointment:   1 year(s)  The format for your next appointment:   In Person  Provider:   Philip Nahser, MD     Other Instructions NONE  Important Information About Sugar       

## 2022-03-31 NOTE — Progress Notes (Signed)
Cardiology Office Note   Date:  03/31/2022   ID:  Kristin Mack, Kristin Mack, DOB 27-Mar-1941, MRN [id number]  PCP:  Donnajean Lopes, MD  Cardiologist:   Mertie Moores, MD   Chief Complaint  Patient presents with   Coronary Artery Disease        Congestive Heart Failure        1. CHF- LV EF has improved dramatically 2. Hyperlipidemia 3. Left hip replacement   81 year old female with history of congestive heart failure. Her original ejection fraction was 10-20%. Her ejection fraction has dramatically improved and is now 57% ( Echo 2009). She had an echocardiogram in February 2011 that revealed normal left ventricle systolic function.   She's had 2 heart catheterizations. Her last cardiac cath was in 2006 which revealed moderate irregularities.   She's not having any episodes of chest pain or shortness breath. She works out a regular basis at least 2-3 times a week.  She has gained 20 lbs over the past several months.  She has a foot ulcer in her left foot and has not been in the pool.  She's not had any angina-like chest pain. She did have some stress-related chest discomfort around Christmastime but that has since resolved.  October 18, 2012:  Kristin Mack has done well for the past 6 months.  She has not had any dyspnea.  She has had lots of left hip pain and has not been able to exercise as much.   She is swimming 4 times a week.  Nov. 24, 2014:  Kristin Mack is doing well.  She has been having some acid reflux ( dry cough).    She has been trying eating smaller portions, earing earlier in the evening, avoiding spicy foods.   May 27,2015:  Kristin Mack is doing well.  No  CP or dyspnea.    Stressed out over family issues.   Her sons were living with her for several months.  She has gone up on her Effexor.  She has had some left thigh problems.    Dec. 1, 2015:  Kristin Mack is seen today for follow up of her CHF.    She has done well.    She has not been swimming as much recently - has  an ulcer on the bottom of her left great toe.   Oct 30, 2014:  Kristin Mack, Kristin Mack is a 81 y.o. female who presents for  Follow up of her CHF. Is getting over a cold. Has gained some weight.  Is back exercising.  Does water exercises.  Likes to go line dancing .   No CP , breathing is ok.  Has lots of stress with her 56 yo son.   Nov. 8,  2016:  Doing well from a cardiac standpoint.  Lots of issues with GERD. Lots of stress ,    No Cp or dyspnea.   November 22, 2015:  Doing well.  Has gained 13 lbs since her last OV in Nov.  She is very frustrated with her 81 year old son who still lives in the house with her.   Breathing is good.   Swim 3 times  a week.    Dec. 6, 2017  Kristin Mack is seen today for a preoperative evaluation. She needs to have a right shoulder surgery and right carpal tunnel surgery.  She is doing well from a cardiac standpoint.   Has brief Chest pain / left shoulder pain .    Pains last for  2-3 minutes.   Not related to exercise - may occur after swimming.    Sept, 4, 2018:  Kristin Mack is seen today for follow up of her CAD Breathing is good .  Has been eating a bit of extra salt.   Going to the dentist  Myoview study in Dec. 2017 shows a fixed apical defect thought to be breast attenuation.  Has not had any cp. Did not have the shoulder surgery   Nov 08, 2017: Today for follow-up of her coronary artery disease.  She is had some systolic congestive heart failure in the past but is overall doing great currently. No CP or dyspnea Has an ulcer on her left great toe.  Started as a callous which she clipped off with sissors .   Got infected.  She met up with an old boyfriend , walked quite a bit at the [place]  No CP   Is not avoiding salt as much as she should .    Wt = 230 today   May 03, 2018: Kristin Mack seen today for follow-up visit. Weight today is 228 pounds. Has not been exercising in the pool  ( due to toe ulcer )  Its now healed.  Breathing is  ok She was on the incorrect dose of Lisinopril for several months Now is on the right dose.  Has some atypical CP with arm movement -  No angina   January 02, 2019:  Kristin Mack is doing well.   Has not been swimming Has missed the exercise,   Has been eating more take out food Admits to eating more salt than she should  Nov 02, 2019:  Kristin Mack is doing well.  n  Wt today is 231 lbs.  Continues to have issues with her right knee.  No CP or dyspnea.   Is not getting as much exercise as she should .   Overall is doing well .  No cp or dyspnea   May 10, 2020:  Kristin Mack is seen today for a follow-up visit of her congestive heart failure.  Her weight today is 212 pounds. Has lost 20 lbs since last visit  Lisinopril has been changed to Losartan  Is avoiding the [facility] pool since she had cataract surgery  Breathing is ok.   Echocardiogram from June, 2021 reveals mildly depressed left ventricular systolic function with an ejection fraction of 45 to 50%.  This EF is unchanged from her previous echo in 2014.  She has grade 1 diastolic dysfunction.  May 06, 2021: Kristin Mack is seen today for follow-up visit of her congestive heart failure.  Her weight today is 208 lbs  No CP or dyspnea. Has some fatigue  Swimming regularly, 1600 times in 20 years   March 31, 2022: Kristin Mack is seen today for follow-up visit.  She has a history of coronary artery disease and congestive heart failure.  LVEF is 45 to 50%.  Weight today was 196 pounds which is down 12 pounds from last November.  Is here for pre op evaluation prior to R carpal  tunnel surgery  No CP , no dyspnea  She is at low risk for her upcoming carpal tunnel surgery.  She may hold her aspirin for 5 to 7 days prior to her surgery.  Restart aspirin as soon as it is deemed safe from surgical standpoint.  Water exercises 3-4 times a week ( an hour each time )  self directed  No cp     Past Medical History:  Diagnosis Date   Carpal tunnel  syndrome    Left Hand   Chest pain    CHF (congestive heart failure) (HCC)    Dilated cardiomyopathy. Her original ejection fraction was 10-20%. Her ejection fraction has improved to 57% by 2009   Coronary artery disease    Depression    Dyslipidemia    Foot drop, left    GERD (gastroesophageal reflux disease)    Headache(784.0)    Hemorrhoids    History of anxiety    Hypertension    Lower back pain    Myocardial infarction ([place])    Obesity    Osteoarthritis    Severe with collapse of the left hip   Single functional kidney    single right kidney,  left kidney is atrophic    Past Surgical History:  Procedure Laterality Date   BREAST BIOPSY Right    x2   CARDIAC CATHETERIZATION     Ejection Fraction was around 45-50%   HYSTEROSCOPY  09/03/2011   Procedure: HYSTEROSCOPY;  Surgeon: Logan Bores, MD;  Location: [facility];  Service: Gynecology;  Laterality: N/A;  With Resection of polyp with truclear system   REPLACEMENT TOTAL HIP W/  RESURFACING IMPLANTS  2002   left   TUBAL LIGATION       Current Outpatient Medications  Medication Sig Dispense Refill   acetaminophen (TYLENOL) 500 MG tablet 1 tab po bid PRN     amoxicillin (AMOXIL) 500 MG capsule Take 4 capsules by mouth as needed. 1 hr prior to dental work     aspirin 81 MG tablet Take 81 mg by mouth daily.      atorvastatin (LIPITOR) 40 MG tablet Take 40 mg by mouth daily.  1   calcium citrate-vitamin D (CITRACAL+D) 315-200 MG-UNIT tablet Take by mouth.     carvedilol (COREG) 12.5 MG tablet TAKE 1 TABLET BY MOUTH TWICE DAILY WITH A MEAL 180 tablet 0   Cholecalciferol (VITAMIN D3) 50 MCG (2000 UT) TABS Take 2,000 Units by mouth daily.     clobetasol cream (TEMOVATE) 6.30 % Apply 1 application topically 2 (two) times daily.      estradiol (ESTRACE) 0.1 MG/GM vaginal cream estradiol 0.01% (0.1 mg/gram) vaginal cream apply locally 3 times per week 42.5 g 0   famotidine (PEPCID) 20 MG tablet Take 20 mg by mouth 2 (two)  times daily.     furosemide (LASIX) 40 MG tablet TAKE 1 TABLET(40 MG) BY MOUTH DAILY 90 tablet 3   gentamicin cream (GARAMYCIN) 0.1 % Apply 1 Application topically 2 (two) times daily. 30 g 1   latanoprost (XALATAN) 0.005 % ophthalmic solution Place 1 drop into both eyes at bedtime.  0   LORazepam (ATIVAN) 0.5 MG tablet Take 0.5 mg by mouth as needed for anxiety.      losartan (COZAAR) 50 MG tablet Take 50 mg by mouth daily. On Monday Wednesday and Friday     melatonin 5 MG TABS Take 1 tablet by mouth at bedtime.     methocarbamol (ROBAXIN) 500 MG tablet TAKE 1 TABLET BY MOUTH EVERY 8 HOURS FOR MUSCLE RELAXATION     moxifloxacin (VIGAMOX) 0.5 % ophthalmic solution      Multiple Vitamins-Minerals (CENTRUM SILVER ULTRA WOMENS) TABS Take 1 tablet by mouth daily.     mupirocin ointment (BACTROBAN) 2 % Apply 1 application topically 2 (two) times daily. 30 g 2   naproxen (NAPROSYN) 500 MG tablet Take by mouth.     nitroGLYCERIN (NITROSTAT) 0.4  MG SL tablet PLACE 1 TABLET UNDER THE TONGUE AS NEEDED EVERY 5 MINUTES FOR CHEST PAIN 25 tablet 5   PRESCRIPTION MEDICATION Place 1 mL vaginally as directed. inser 1 ml vaginally 2-3 times per week as directed  Estradiol Micronized  0.02% cream (is compounded for patient)     risperiDONE (RISPERDAL) 1 MG tablet Take 1 tablet by mouth daily.     sodium fluoride (FLUORISHIELD) 1.1 % GEL dental gel PreviDent 5000 Booster Plus 1.1 % dental paste     venlafaxine XR (EFFEXOR-XR) 75 MG 24 hr capsule Take 3 capsules by mouth daily.     No current facility-administered medications for this visit.    Allergies:   Celecoxib and Nickel    Social History:  The patient  reports that she has never smoked. She has never used smokeless tobacco. She reports that she does not drink alcohol and does not use drugs.   Family History:  The patient's family history includes Cancer in her sister; Stroke in her maternal grandfather and mother.    ROS:   Noted in current history,  otherwise review of systems is negative.  Physical Exam: Blood pressure (!) 108/50, pulse 65, height _0  (1.676 m), weight 196 lb 6.4 oz (89.1 kg), SpO2 96 %.      GEN:  Well nourished, well developed in no acute distress HEENT: Normal NECK: No JVD; No carotid bruits LYMPHATICS: No lymphadenopathy CARDIAC: RRR , no murmurs, rubs, gallops RESPIRATORY:  Clear to auscultation without rales, wheezing or rhonchi  ABDOMEN: Soft, non-tender, non-distended MUSCULOSKELETAL:  No edema; No deformity  SKIN: Warm and dry NEUROLOGIC:  Alert and oriented x 3   EKG:     March 31, 2022: Normal sinus rhythm at 65.  Poor R wave progression-possible septal myocardial infarction.  Recent Labs: 05/06/2021: ALT 16; BUN 15; Creatinine, Ser 0.80; Potassium 4.6; Sodium 140    Lipid Panel    Component Value Date/Time   CHOL 150 05/06/2021 1047   TRIG 102 05/06/2021 1047   HDL 52 05/06/2021 1047   CHOLHDL 2.9 05/06/2021 1047   CHOLHDL 2.7 05/27/2016 1044   VLDL 23 05/27/2016 1044   LDLCALC 79 05/06/2021 1047      Wt Readings from Last 3 Encounters:  03/31/22 196 lb 6.4 oz (89.1 kg)  12/25/21 202 lb 3.2 oz (91.7 kg)  07/22/21 211 lb 9.6 oz (96 kg)      Other studies Reviewed: Additional studies/ records that were reviewed today include: . Review of the above records demonstrates:    ASSESSMENT AND PLAN:  1. Chronic systolic CHF-       stable ,  no significant dyspnea   2. CAD :      no recent angina .   Exercises several times a week   3. Hyperlipidemia -     labs from Dr. Shon Baton office look great     3. Carpal Tunnel syndrome:   Is here for pre op evaluation prior to R carpal  tunnel surgery  No CP , no dyspnea  She is at low risk for her upcoming carpal tunnel surgery.  She may hold her aspirin for 5 to 7 days prior to her surgery.  Restart aspirin as soon as it is deemed safe from surgical standpoint.         Current medicines are reviewed at length with the patient  today.  The patient does not have concerns regarding medicines.  The following changes have been made:  no  change  Labs/ tests ordered today include:   Orders Placed This Encounter  Procedures   EKG 12-Lead     Disposition:   FU with me  or APP in 1 year.     Mertie Moores, MD  03/31/2022 12:12 PM    [address] [place], [place],   [address] Phone: [phone]; Fax: [phone]

## 2022-04-02 DIAGNOSIS — M810 Age-related osteoporosis without current pathological fracture: Secondary | ICD-10-CM | POA: Diagnosis not present

## 2022-04-07 DIAGNOSIS — F429 Obsessive-compulsive disorder, unspecified: Secondary | ICD-10-CM | POA: Diagnosis not present

## 2022-04-08 DIAGNOSIS — F429 Obsessive-compulsive disorder, unspecified: Secondary | ICD-10-CM | POA: Diagnosis not present

## 2022-04-13 DIAGNOSIS — R1031 Right lower quadrant pain: Secondary | ICD-10-CM | POA: Diagnosis not present

## 2022-04-13 DIAGNOSIS — R1030 Lower abdominal pain, unspecified: Secondary | ICD-10-CM | POA: Diagnosis not present

## 2022-04-13 DIAGNOSIS — M25551 Pain in right hip: Secondary | ICD-10-CM | POA: Diagnosis not present

## 2022-04-13 DIAGNOSIS — M1611 Unilateral primary osteoarthritis, right hip: Secondary | ICD-10-CM | POA: Diagnosis not present

## 2022-04-20 ENCOUNTER — Ambulatory Visit: Payer: Medicare PPO | Admitting: Cardiovascular Disease

## 2022-04-22 DIAGNOSIS — F429 Obsessive-compulsive disorder, unspecified: Secondary | ICD-10-CM | POA: Diagnosis not present

## 2022-05-01 ENCOUNTER — Telehealth: Payer: Self-pay | Admitting: Cardiovascular Disease

## 2022-05-01 NOTE — Telephone Encounter (Signed)
Dr. Rush Barer on the line wanting to ask questions regarding a surgery

## 2022-05-01 NOTE — Telephone Encounter (Signed)
Patient called very anxious about her upcoming carpal tunnel surgery. She states that the [facility] locations she's scheduled at does not have a cardiologist on site and she is wondering if she should change where her procedure will be done. Educated patient that she is cleared from a cardiac standpoint and that she is at a very low risk of complications with this surgery, but that if she is this worried perhaps she should change it. Advised that she call them to discuss their protocols, but reiterated that if they are putting her under moderate sedation, they will be monitoring her vitals, o2 and heart rate and will have protocols in place in the event something happens. Patient thanked me for the reassurance.

## 2022-05-05 DIAGNOSIS — G5601 Carpal tunnel syndrome, right upper limb: Secondary | ICD-10-CM | POA: Diagnosis not present

## 2022-05-05 DIAGNOSIS — M62531 Muscle wasting and atrophy, not elsewhere classified, right forearm: Secondary | ICD-10-CM | POA: Diagnosis not present

## 2022-05-05 DIAGNOSIS — M62541 Muscle wasting and atrophy, not elsewhere classified, right hand: Secondary | ICD-10-CM | POA: Diagnosis not present

## 2022-05-06 ENCOUNTER — Ambulatory Visit: Payer: Medicare PPO | Admitting: Cardiovascular Disease

## 2022-05-06 ENCOUNTER — Telehealth: Payer: Self-pay | Admitting: Cardiovascular Disease

## 2022-05-06 NOTE — Telephone Encounter (Signed)
  Pt would like to ask Dr. Acie Fredrickson where she should get her hip replacement in [facility] or [facility]. She requested an appt with Dr. Acie Fredrickson and made an appt on 07/10/22 but wanted to speak with a nurse. She said she has some questions

## 2022-05-07 ENCOUNTER — Ambulatory Visit: Payer: Medicare PPO | Admitting: Podiatry

## 2022-05-08 NOTE — Telephone Encounter (Signed)
Returned call to patient who states she is wanting her hip replacement to be done at [facility] hospital where there is "cardiology backup." She states she feels strongly and just knows that [facility] would prefer her to have cardiology on-site for such a procedure, so she is wanting to discuss with Dr Acie Fredrickson who he feels would be the best to do the procedure and where. She is requesting OV for this and would like the current January appt moved up. Rescheduled for 05/11/22.

## 2022-05-10 ENCOUNTER — Encounter: Payer: Self-pay | Admitting: Cardiovascular Disease

## 2022-05-10 NOTE — Progress Notes (Unsigned)
Cardiology Office Note   Date:  05/11/2022   ID:  Kristin Ina, PhD, DOB [birth year], MRN [id number]  PCP:  Donnajean Lopes, MD  Cardiologist:   Mertie Moores, MD   Chief Complaint  Patient presents with   Congestive Heart Failure        1. CHF- LV EF has improved dramatically 2. Hyperlipidemia 3. Left hip replacement   81 year old female with history of congestive heart failure. Her original ejection fraction was 10-20%. Her ejection fraction has dramatically improved and is now 57% ( Echo 2009). She had an echocardiogram in February 2011 that revealed normal left ventricle systolic function.   She's had 2 heart catheterizations. Her last cardiac cath was in 2006 which revealed moderate irregularities.   She's not having any episodes of chest pain or shortness breath. She works out a regular basis at least 2-3 times a week.  She has gained 20 lbs over the past several months.  She has a foot ulcer in her left foot and has not been in the pool.  She's not had any angina-like chest pain. She did have some stress-related chest discomfort around Christmastime but that has since resolved.  October 18, 2012:  Kristin Mack has done well for the past 6 months.  She has not had any dyspnea.  She has had lots of left hip pain and has not been able to exercise as much.   She is swimming 4 times a week.  Nov. 24, 2014:  Kristin Mack is doing well.  She has been having some acid reflux ( dry cough).    She has been trying eating smaller portions, earing earlier in the evening, avoiding spicy foods.   May 27,2015:  Kristin Mack is doing well.  No  CP or dyspnea.    Stressed out over family issues.   Her sons were living with her for several months.  She has gone up on her Effexor.  She has had some left thigh problems.    Dec. 1, 2015:  Kristin Mack is seen today for follow up of her CHF.    She has done well.    She has not been swimming as much recently - has an ulcer on the bottom of her left  great toe.   Oct 30, 2014:  Kristin Ina, PhD is a 81 y.o. female who presents for  Follow up of her CHF. Is getting over a cold. Has gained some weight.  Is back exercising.  Does water exercises.  Likes to go line dancing .   No CP , breathing is ok.  Has lots of stress with her 79 yo son.   Nov. 8,  2016:  Doing well from a cardiac standpoint.  Lots of issues with GERD. Lots of stress ,    No Cp or dyspnea.   November 22, 2015:  Doing well.  Has gained 13 lbs since her last OV in Nov.  She is very frustrated with her 79 year old son who still lives in the house with her.   Breathing is good.   Swim 3 times  a week.    Dec. 6, 2017  Kristin Mack is seen today for a preoperative evaluation. She needs to have a right shoulder surgery and right carpal tunnel surgery.  She is doing well from a cardiac standpoint.   Has brief Chest pain / left shoulder pain .    Pains last for 2-3 minutes.   Not related to exercise - may  occur after swimming.    Sept, 4, 2018:  Kristin Mack is seen today for follow up of her CAD Breathing is good .  Has been eating a bit of extra salt.   Going to the dentist  Myoview study in Dec. 2017 shows a fixed apical defect thought to be breast attenuation.  Has not had any cp. Did not have the shoulder surgery   Nov 08, 2017: Today for follow-up of her coronary artery disease.  She is had some systolic congestive heart failure in the past but is overall doing great currently. No CP or dyspnea Has an ulcer on her left great toe.  Started as a callous which she clipped off with sissors .   Got infected.  She met up with an old boyfriend , walked quite a bit at the [place]  No CP   Is not avoiding salt as much as she should .    Wt = 230 today   May 03, 2018: Kristin Mack seen today for follow-up visit. Weight today is 228 pounds. Has not been exercising in the pool  ( due to toe ulcer )  Its now healed.  Breathing is ok She was on the incorrect dose  of Lisinopril for several months Now is on the right dose.  Has some atypical CP with arm movement -  No angina   January 02, 2019:  Kristin Mack is doing well.   Has not been swimming Has missed the exercise,   Has been eating more take out food Admits to eating more salt than she should  Nov 02, 2019:  Kristin Mack is doing well.  n  Wt today is 231 lbs.  Continues to have issues with her right knee.  No CP or dyspnea.   Is not getting as much exercise as she should .   Overall is doing well .  No cp or dyspnea   May 10, 2020:  Kristin Mack is seen today for a follow-up visit of her congestive heart failure.  Her weight today is 212 pounds. Has lost 20 lbs since last visit  Lisinopril has been changed to Losartan  Is avoiding the [facility] pool since she had cataract surgery  Breathing is ok.   Echocardiogram from June, 2021 reveals mildly depressed left ventricular systolic function with an ejection fraction of 45 to 50%.  This EF is unchanged from her previous echo in 2014.  She has grade 1 diastolic dysfunction.  May 06, 2021: Kristin Mack is seen today for follow-up visit of her congestive heart failure.  Her weight today is 208 lbs  No CP or dyspnea. Has some fatigue  Swimming regularly, 1600 times in 20 years   March 31, 2022: Kristin Mack is seen today for follow-up visit.  She has a history of coronary artery disease and congestive heart failure.  LVEF is 45 to 50%.  Weight today was 196 pounds which is down 12 pounds from last November.  Is here for pre op evaluation prior to R carpal  tunnel surgery  No CP , no dyspnea  She is at low risk for her upcoming carpal tunnel surgery.  She may hold her aspirin for 5 to 7 days prior to her surgery.  Restart aspirin as soon as it is deemed safe from surgical standpoint.  Water exercises 3-4 times a week ( an hour each time )  self directed  No cp    Nov. 20, 2023 Kristin Mack is seen to discuss her upcoming hip surgery. Had R carpal  tunnel  surgery.   Had questions about the CV risks associated with her hip surgery  No cp ,  No dyspnea   Is on Losartan only 3 days a week ( because of hyperkalemai)  We discussed Entresto I would like for her to be over her carple tunnel surgery and over the hip surgery before we change meds that could potentially cause hypotension  Her LV function is only mildly depressed  She is at low risk for her hip replacement . She may hold her ASA for 5-7 days prior to her surgery .      Past Medical History:  Diagnosis Date   Carpal tunnel syndrome    Left Hand   Chest pain    CHF (congestive heart failure) (HCC)    Dilated cardiomyopathy. Her original ejection fraction was 10-20%. Her ejection fraction has improved to 57% by 2009   Coronary artery disease    Depression    Dyslipidemia    Foot drop, left    GERD (gastroesophageal reflux disease)    Headache(784.0)    Hemorrhoids    History of anxiety    Hypertension    Lower back pain    Myocardial infarction ([place])    Obesity    Osteoarthritis    Severe with collapse of the left hip   Single functional kidney    single right kidney,  left kidney is atrophic    Past Surgical History:  Procedure Laterality Date   BREAST BIOPSY Right    x2   CARDIAC CATHETERIZATION     Ejection Fraction was around 45-50%   HYSTEROSCOPY  09/03/2011   Procedure: HYSTEROSCOPY;  Surgeon: Logan Bores, MD;  Location: [facility];  Service: Gynecology;  Laterality: N/A;  With Resection of polyp with truclear system   REPLACEMENT TOTAL HIP W/  RESURFACING IMPLANTS  2002   left   TUBAL LIGATION       Current Outpatient Medications  Medication Sig Dispense Refill   acetaminophen (TYLENOL) 500 MG tablet 1 tab po bid PRN     amoxicillin (AMOXIL) 500 MG capsule Take 4 capsules by mouth as needed. 1 hr prior to dental work     aspirin 81 MG tablet Take 81 mg by mouth daily.      atorvastatin (LIPITOR) 40 MG tablet Take 40 mg by mouth daily.  1    calcium citrate-vitamin D (CITRACAL+D) 315-200 MG-UNIT tablet Take by mouth.     carvedilol (COREG) 12.5 MG tablet TAKE 1 TABLET BY MOUTH TWICE DAILY WITH A MEAL 180 tablet 0   Cholecalciferol (VITAMIN D3) 50 MCG (2000 UT) TABS Take 2,000 Units by mouth daily.     clobetasol cream (TEMOVATE) 9.93 % Apply 1 application topically 2 (two) times daily.      doxycycline (VIBRAMYCIN) 100 MG capsule Take 100 mg by mouth 2 (two) times daily.     estradiol (ESTRACE) 0.1 MG/GM vaginal cream estradiol 0.01% (0.1 mg/gram) vaginal cream apply locally 3 times per week 42.5 g 0   famotidine (PEPCID) 20 MG tablet Take 20 mg by mouth 2 (two) times daily.     furosemide (LASIX) 40 MG tablet TAKE 1 TABLET(40 MG) BY MOUTH DAILY 90 tablet 3   gentamicin cream (GARAMYCIN) 0.1 % Apply 1 Application topically 2 (two) times daily. 30 g 1   latanoprost (XALATAN) 0.005 % ophthalmic solution Place 1 drop into both eyes at bedtime.  0   LORazepam (ATIVAN) 0.5 MG tablet Take 0.5 mg by mouth  as needed for anxiety.      losartan (COZAAR) 50 MG tablet Take 50 mg by mouth daily. On Monday Wednesday and Friday     melatonin 5 MG TABS Take 1 tablet by mouth at bedtime.     methocarbamol (ROBAXIN) 500 MG tablet TAKE 1 TABLET BY MOUTH EVERY 8 HOURS FOR MUSCLE RELAXATION     moxifloxacin (VIGAMOX) 0.5 % ophthalmic solution      Multiple Vitamins-Minerals (CENTRUM SILVER ULTRA WOMENS) TABS Take 1 tablet by mouth daily.     mupirocin ointment (BACTROBAN) 2 % Apply 1 application topically 2 (two) times daily. 30 g 2   naproxen (NAPROSYN) 500 MG tablet Take by mouth.     nitroGLYCERIN (NITROSTAT) 0.4 MG SL tablet PLACE 1 TABLET UNDER THE TONGUE AS NEEDED EVERY 5 MINUTES FOR CHEST PAIN 25 tablet 5   oxyCODONE (OXY IR/ROXICODONE) 5 MG immediate release tablet Take 5 mg by mouth every 4 (four) hours as needed.     PRESCRIPTION MEDICATION Place 1 mL vaginally as directed. inser 1 ml vaginally 2-3 times per week as directed  Estradiol  Micronized  0.02% cream (is compounded for patient)     risperiDONE (RISPERDAL) 1 MG tablet Take 1 tablet by mouth daily.     sodium fluoride (FLUORISHIELD) 1.1 % GEL dental gel PreviDent 5000 Booster Plus 1.1 % dental paste     venlafaxine XR (EFFEXOR-XR) 75 MG 24 hr capsule Take 3 capsules by mouth daily.     No current facility-administered medications for this visit.    Allergies:   Celecoxib and Nickel    Social History:  The patient  reports that she has never smoked. She has never used smokeless tobacco. She reports that she does not drink alcohol and does not use drugs.   Family History:  The patient's family history includes Cancer in her sister; Stroke in her maternal grandfather and mother.    ROS:   Noted in current history, otherwise review of systems is negative.  Physical Exam: Blood pressure 122/66, pulse 65, height _0  (1.676 m), weight 194 lb (88 kg), SpO2 99 %.      GEN:  Well nourished, well developed in no acute distress HEENT: Normal NECK: No JVD; No carotid bruits LYMPHATICS: No lymphadenopathy CARDIAC: RRR , no murmurs, rubs, gallops RESPIRATORY:  Clear to auscultation without rales, wheezing or rhonchi  ABDOMEN: Soft, non-tender, non-distended MUSCULOSKELETAL:  No edema;  R hand is in a cast / splint  SKIN: Warm and dry NEUROLOGIC:  Alert and oriented x 3    EKG:        Recent Labs: No results found for requested labs within last 365 days.    Lipid Panel    Component Value Date/Time   CHOL 150 05/06/2021 1047   TRIG 102 05/06/2021 1047   HDL 52 05/06/2021 1047   CHOLHDL 2.9 05/06/2021 1047   CHOLHDL 2.7 05/27/2016 1044   VLDL 23 05/27/2016 1044   LDLCALC 79 05/06/2021 1047      Wt Readings from Last 3 Encounters:  05/11/22 194 lb (88 kg)  03/31/22 196 lb 6.4 oz (89.1 kg)  12/25/21 202 lb 3.2 oz (91.7 kg)      Other studies Reviewed: Additional studies/ records that were reviewed today include: . Review of the above records  demonstrates:    ASSESSMENT AND PLAN:  1. Chronic systolic CHF-      has mild chronic combined CHF ,  does not appear to be volume overloaded.  She has had issues with daily losartan.  She may tolerate low-dose Entresto although I do not think we will want to change while she is in the middle of getting surgeries.  I would want to make sure that she has full use of her arms and legs before we add medicines that might cause her to be hypotensive.  We can discuss this at her next visit    2. CAD :     She denies any angina.  3. Hyperlipidemia -     Stable.    3. Carpal Tunnel syndrome:   Status post right carpal tunnel surgery.         Current medicines are reviewed at length with the patient today.  The patient does not have concerns regarding medicines.  The following changes have been made:  no change  Labs/ tests ordered today include:   No orders of the defined types were placed in this encounter.    Disposition:   FU with me in 6 months     Mertie Moores, MD  05/11/2022 1:19 PM    [facility] [address], [place], [place]  [address] Phone: [phone]; Fax: [phone]

## 2022-05-11 ENCOUNTER — Encounter: Payer: Self-pay | Admitting: Cardiovascular Disease

## 2022-05-11 ENCOUNTER — Ambulatory Visit: Payer: Medicare PPO | Attending: Cardiovascular Disease | Admitting: Cardiovascular Disease

## 2022-05-11 VITALS — BP 122/66 | HR 65 | Ht 66.0 in | Wt 194.0 lb

## 2022-05-11 DIAGNOSIS — I5042 Chronic combined systolic (congestive) and diastolic (congestive) heart failure: Secondary | ICD-10-CM

## 2022-05-11 NOTE — Patient Instructions (Signed)
Medication Instructions:  Your physician recommends that you continue on your current medications as directed. Please refer to the Current Medication list given to you today.  *If you need a refill on your cardiac medications before your next appointment, please call your pharmacy*   Lab Work: NONE If you have labs (blood work) drawn today and your tests are completely normal, you will receive your results only by: [place] (if you have MyChart) OR A paper copy in the mail If you have any lab test that is abnormal or we need to change your treatment, we will call you to review the results.   Testing/Procedures: NONE   Follow-Up: At [facility], you and your health needs are our priority.  As part of our continuing mission to provide you with exceptional heart care, we have created designated Provider Care Teams.  These Care Teams include your primary Cardiologist (physician) and Advanced Practice Providers (APPs -  Physician Assistants and Nurse Practitioners) who all work together to provide you with the care you need, when you need it.  Your next appointment:   6 month(s)  The format for your next appointment:   In Person  Provider:   Mertie Moores, MD       Important Information About Sugar

## 2022-05-12 ENCOUNTER — Ambulatory Visit: Payer: Medicare PPO | Admitting: Cardiovascular Disease

## 2022-05-12 ENCOUNTER — Encounter: Payer: Self-pay | Admitting: Podiatry

## 2022-05-12 ENCOUNTER — Ambulatory Visit: Payer: Medicare PPO | Admitting: Podiatry

## 2022-05-12 DIAGNOSIS — M79676 Pain in unspecified toe(s): Secondary | ICD-10-CM | POA: Diagnosis not present

## 2022-05-12 DIAGNOSIS — D2372 Other benign neoplasm of skin of left lower limb, including hip: Secondary | ICD-10-CM

## 2022-05-12 DIAGNOSIS — B351 Tinea unguium: Secondary | ICD-10-CM

## 2022-05-17 NOTE — Progress Notes (Signed)
She presents today chief complaint of painful elongated toenails and follow-up of her chronic ulceration to the plantar medial aspect of the hallux left.  Objective: Vital signs stable alert and oriented x 3.  Pulses are palpable.  No open lesions or wounds are noted today otherwise toenails are long thick yellow dystrophic with mycotic.  Assessment: Pain in limb secondary to onychomycosis Skin lesion no open lesions or wounds.  Plan: Debridement of all benign skin lesions debridement of toenails 1 through 5 bilateral.

## 2022-05-20 DIAGNOSIS — M1611 Unilateral primary osteoarthritis, right hip: Secondary | ICD-10-CM | POA: Diagnosis not present

## 2022-05-20 DIAGNOSIS — Z4789 Encounter for other orthopedic aftercare: Secondary | ICD-10-CM | POA: Diagnosis not present

## 2022-05-20 DIAGNOSIS — M25551 Pain in right hip: Secondary | ICD-10-CM | POA: Diagnosis not present

## 2022-05-20 DIAGNOSIS — F429 Obsessive-compulsive disorder, unspecified: Secondary | ICD-10-CM | POA: Diagnosis not present

## 2022-05-25 DIAGNOSIS — Z1152 Encounter for screening for COVID-19: Secondary | ICD-10-CM | POA: Diagnosis not present

## 2022-05-25 DIAGNOSIS — J209 Acute bronchitis, unspecified: Secondary | ICD-10-CM | POA: Diagnosis not present

## 2022-05-25 DIAGNOSIS — R051 Acute cough: Secondary | ICD-10-CM | POA: Diagnosis not present

## 2022-06-03 DIAGNOSIS — M79641 Pain in right hand: Secondary | ICD-10-CM | POA: Diagnosis not present

## 2022-06-03 DIAGNOSIS — F429 Obsessive-compulsive disorder, unspecified: Secondary | ICD-10-CM | POA: Diagnosis not present

## 2022-06-09 DIAGNOSIS — F329 Major depressive disorder, single episode, unspecified: Secondary | ICD-10-CM | POA: Diagnosis not present

## 2022-06-16 DIAGNOSIS — M79641 Pain in right hand: Secondary | ICD-10-CM | POA: Diagnosis not present

## 2022-06-18 ENCOUNTER — Telehealth: Payer: Self-pay | Admitting: Cardiovascular Disease

## 2022-06-18 NOTE — Telephone Encounter (Signed)
Pt states that she has medication that needs to be added to her medication list. Medication is Prolia, an injection that she gets every 6 months. Pt would like a callback. Please advise

## 2022-06-18 NOTE — Telephone Encounter (Signed)
Returned call to patient.  She states she receives a Prolia injection every 6 months and would like this added to her medication list.  Medication list updated with addition of Prolia every 6 months.  Patient verbalized understanding and expressed appreciation for call.

## 2022-06-26 DIAGNOSIS — M79641 Pain in right hand: Secondary | ICD-10-CM | POA: Diagnosis not present

## 2022-06-30 DIAGNOSIS — M79641 Pain in right hand: Secondary | ICD-10-CM | POA: Diagnosis not present

## 2022-07-08 ENCOUNTER — Other Ambulatory Visit: Payer: Self-pay | Admitting: Cardiovascular Disease

## 2022-07-08 DIAGNOSIS — F429 Obsessive-compulsive disorder, unspecified: Secondary | ICD-10-CM | POA: Diagnosis not present

## 2022-07-09 DIAGNOSIS — M1611 Unilateral primary osteoarthritis, right hip: Secondary | ICD-10-CM | POA: Diagnosis not present

## 2022-07-10 ENCOUNTER — Ambulatory Visit: Payer: Medicare PPO | Admitting: Cardiovascular Disease

## 2022-07-14 DIAGNOSIS — M79641 Pain in right hand: Secondary | ICD-10-CM | POA: Diagnosis not present

## 2022-07-20 DIAGNOSIS — M79641 Pain in right hand: Secondary | ICD-10-CM | POA: Diagnosis not present

## 2022-07-21 ENCOUNTER — Encounter: Payer: Self-pay | Admitting: Podiatry

## 2022-07-21 ENCOUNTER — Ambulatory Visit: Payer: Medicare PPO | Admitting: Podiatry

## 2022-07-21 DIAGNOSIS — B351 Tinea unguium: Secondary | ICD-10-CM | POA: Diagnosis not present

## 2022-07-21 DIAGNOSIS — D2372 Other benign neoplasm of skin of left lower limb, including hip: Secondary | ICD-10-CM | POA: Diagnosis not present

## 2022-07-21 DIAGNOSIS — M79676 Pain in unspecified toe(s): Secondary | ICD-10-CM | POA: Diagnosis not present

## 2022-07-21 NOTE — Progress Notes (Signed)
She presents today chief complaint of painful elongated toenails and calluses plantar aspect of the hallux left this has been preulcerative in the past but she states that she is doing pretty well.  Is recently had carpal tunnel surgery and arthritis surgery on her medic carpal phalangeal joint..  Objective: Pulses remain palpable.  Rigid hallux interphalangeal mild hallux valgus deformity left foot no open skin lesions.  Reactive hyperkeratotic lesion plantar medial aspect of the hallux interphalangeal joint.  Otherwise toenails 1 through 5 are thick yellow dystrophic and mycotic.  Assessment: Pain in limb

## 2022-07-22 DIAGNOSIS — F429 Obsessive-compulsive disorder, unspecified: Secondary | ICD-10-CM | POA: Diagnosis not present

## 2022-07-27 DIAGNOSIS — M79641 Pain in right hand: Secondary | ICD-10-CM | POA: Diagnosis not present

## 2022-08-03 DIAGNOSIS — M25541 Pain in joints of right hand: Secondary | ICD-10-CM | POA: Diagnosis not present

## 2022-08-04 DIAGNOSIS — H52203 Unspecified astigmatism, bilateral: Secondary | ICD-10-CM | POA: Diagnosis not present

## 2022-08-04 DIAGNOSIS — H401112 Primary open-angle glaucoma, right eye, moderate stage: Secondary | ICD-10-CM | POA: Diagnosis not present

## 2022-08-04 DIAGNOSIS — H401123 Primary open-angle glaucoma, left eye, severe stage: Secondary | ICD-10-CM | POA: Diagnosis not present

## 2022-08-04 DIAGNOSIS — Z961 Presence of intraocular lens: Secondary | ICD-10-CM | POA: Diagnosis not present

## 2022-08-05 DIAGNOSIS — F429 Obsessive-compulsive disorder, unspecified: Secondary | ICD-10-CM | POA: Diagnosis not present

## 2022-08-10 DIAGNOSIS — M25541 Pain in joints of right hand: Secondary | ICD-10-CM | POA: Diagnosis not present

## 2022-08-11 DIAGNOSIS — H35343 Macular cyst, hole, or pseudohole, bilateral: Secondary | ICD-10-CM | POA: Diagnosis not present

## 2022-08-11 DIAGNOSIS — H401111 Primary open-angle glaucoma, right eye, mild stage: Secondary | ICD-10-CM | POA: Diagnosis not present

## 2022-08-11 DIAGNOSIS — H401123 Primary open-angle glaucoma, left eye, severe stage: Secondary | ICD-10-CM | POA: Diagnosis not present

## 2022-08-11 DIAGNOSIS — H33313 Horseshoe tear of retina without detachment, bilateral: Secondary | ICD-10-CM | POA: Diagnosis not present

## 2022-08-11 DIAGNOSIS — H31093 Other chorioretinal scars, bilateral: Secondary | ICD-10-CM | POA: Diagnosis not present

## 2022-08-11 DIAGNOSIS — H35373 Puckering of macula, bilateral: Secondary | ICD-10-CM | POA: Diagnosis not present

## 2022-08-13 DIAGNOSIS — M25541 Pain in joints of right hand: Secondary | ICD-10-CM | POA: Diagnosis not present

## 2022-08-17 DIAGNOSIS — M25541 Pain in joints of right hand: Secondary | ICD-10-CM | POA: Diagnosis not present

## 2022-08-20 DIAGNOSIS — F429 Obsessive-compulsive disorder, unspecified: Secondary | ICD-10-CM | POA: Diagnosis not present

## 2022-08-20 DIAGNOSIS — M25541 Pain in joints of right hand: Secondary | ICD-10-CM | POA: Diagnosis not present

## 2022-08-24 DIAGNOSIS — M25541 Pain in joints of right hand: Secondary | ICD-10-CM | POA: Diagnosis not present

## 2022-08-28 DIAGNOSIS — M79641 Pain in right hand: Secondary | ICD-10-CM | POA: Diagnosis not present

## 2022-09-01 DIAGNOSIS — Z85820 Personal history of malignant melanoma of skin: Secondary | ICD-10-CM | POA: Diagnosis not present

## 2022-09-01 DIAGNOSIS — D2272 Melanocytic nevi of left lower limb, including hip: Secondary | ICD-10-CM | POA: Diagnosis not present

## 2022-09-01 DIAGNOSIS — D225 Melanocytic nevi of trunk: Secondary | ICD-10-CM | POA: Diagnosis not present

## 2022-09-01 DIAGNOSIS — L218 Other seborrheic dermatitis: Secondary | ICD-10-CM | POA: Diagnosis not present

## 2022-09-01 DIAGNOSIS — L821 Other seborrheic keratosis: Secondary | ICD-10-CM | POA: Diagnosis not present

## 2022-09-01 DIAGNOSIS — D2271 Melanocytic nevi of right lower limb, including hip: Secondary | ICD-10-CM | POA: Diagnosis not present

## 2022-09-01 DIAGNOSIS — Z85828 Personal history of other malignant neoplasm of skin: Secondary | ICD-10-CM | POA: Diagnosis not present

## 2022-09-02 DIAGNOSIS — M25551 Pain in right hip: Secondary | ICD-10-CM | POA: Diagnosis not present

## 2022-09-02 DIAGNOSIS — R52 Pain, unspecified: Secondary | ICD-10-CM | POA: Insufficient documentation

## 2022-09-02 DIAGNOSIS — F429 Obsessive-compulsive disorder, unspecified: Secondary | ICD-10-CM | POA: Diagnosis not present

## 2022-09-02 DIAGNOSIS — Z4789 Encounter for other orthopedic aftercare: Secondary | ICD-10-CM | POA: Diagnosis not present

## 2022-09-08 ENCOUNTER — Other Ambulatory Visit: Payer: Self-pay | Admitting: Obstetrics and Gynecology

## 2022-09-08 DIAGNOSIS — Z1231 Encounter for screening mammogram for malignant neoplasm of breast: Secondary | ICD-10-CM

## 2022-09-16 ENCOUNTER — Ambulatory Visit: Payer: Medicare PPO | Admitting: Podiatry

## 2022-09-16 DIAGNOSIS — L97512 Non-pressure chronic ulcer of other part of right foot with fat layer exposed: Secondary | ICD-10-CM | POA: Diagnosis not present

## 2022-09-16 DIAGNOSIS — F429 Obsessive-compulsive disorder, unspecified: Secondary | ICD-10-CM | POA: Diagnosis not present

## 2022-09-16 MED ORDER — SILVER SULFADIAZINE 1 % EX CREA: 1.0000 | TOPICAL_CREAM | Freq: Every day | CUTANEOUS | 1 refills | Status: DC

## 2022-09-16 NOTE — Progress Notes (Signed)
Chief Complaint  Patient presents with   Foot Ulcer    Patient came in today for right foot 5th toe ulcer started 3 days ago, rate of pain 4 out of 10 when rubbing in the shoe, TX: Neosporin,     HPI: 82 y.o. female presenting today for new complaint of a superficial ulcer that developed to the dorsal aspect of the right fifth digit.  Patient states that about 1 week ago she wore a different pair shoes without socks and after wearing them she noticed a ulcer developed to the fifth digit of the right foot.  She has been applying gauze to the area.  Presenting for further treatment and evaluation  Past Medical History:  Diagnosis Date   Carpal tunnel syndrome    Left Hand   Chest pain    CHF (congestive heart failure) (HCC)    Dilated cardiomyopathy. Her original ejection fraction was 10-20%. Her ejection fraction has improved to 57% by 2009   Coronary artery disease    Depression    Dyslipidemia    Foot drop, left    GERD (gastroesophageal reflux disease)    Headache(784.0)    Hemorrhoids    History of anxiety    Hypertension    Lower back pain    Myocardial infarction ([place])    Obesity    Osteoarthritis    Severe with collapse of the left hip   Single functional kidney    single right kidney,  left kidney is atrophic    Past Surgical History:  Procedure Laterality Date   BREAST BIOPSY Right    x2   CARDIAC CATHETERIZATION     Ejection Fraction was around 45-50%   HYSTEROSCOPY  09/03/2011   Procedure: HYSTEROSCOPY;  Surgeon: Logan Bores, MD;  Location: [facility];  Service: Gynecology;  Laterality: N/A;  With Resection of polyp with truclear system   REPLACEMENT TOTAL HIP W/  RESURFACING IMPLANTS  2002   left   TUBAL LIGATION      Allergies  Allergen Reactions   Celecoxib Other (See Comments)   Vigamox [Moxifloxacin] Other (See Comments)   Nickel Itching and Rash    Other reaction(s): Not available     RT fifth toe 09/16/2022  Physical Exam: General: The  patient is alert and oriented x3 in no acute distress.  Dermatology: Superficial ulcer noted overlying the PIPJ of the fifth digit right foot measuring approximately 0.3 x 0.3 x 0.1 cm.  good potential for healing.  No drainage.  No erythema or indication of infection  Vascular: Palpable pedal pulses bilaterally. Capillary refill within normal limits.  Negative for any significant edema or erythema  Neurological: Grossly intact via light touch  Musculoskeletal Exam: Hammertoe deformity noted right foot contributing to the dorsal toe pressure in her shoes.  Foot drop left lower extremity secondary to left hip replacement surgery 20 years ago  Assessment: 1.  Ulcer right fifth digit fat layer exposed   Plan of Care:  1. Patient evaluated.  2.  Comprehensive foot exam performed today 3.  Prescription for Silvadene cream.  Apply daily with a Band-Aid 4.  Patient has a pair shoes that do not irritate the fifth toe.  Continue wearing her new balance shoes that allow plenty of room in the toebox to reduce friction from the hammertoes 5.  Return to clinic 3 weeks follow-up     Edrick Kins, DPM [facility]  Dr. Edrick Kins, DPM  [address], [place] [address]                Office [phone]  Fax [phone]

## 2022-09-17 ENCOUNTER — Telehealth: Payer: Self-pay | Admitting: Podiatry

## 2022-09-17 NOTE — Telephone Encounter (Signed)
Pt had a little ulcer on her right  little toe and Dr Amalia Hailey gave her cream to put on it and said to put a band aid on it daily.Should she wear it at night as well?

## 2022-09-21 ENCOUNTER — Telehealth: Payer: Self-pay | Admitting: *Deleted

## 2022-09-22 NOTE — Telephone Encounter (Signed)
Patient was called .

## 2022-09-23 NOTE — Telephone Encounter (Signed)
Notified pt today she should be wearing bandaid at night,as I was out of the office. I did ask if anyone had contacted her earlier this week and she said someone did call her.

## 2022-09-24 ENCOUNTER — Ambulatory Visit: Payer: Medicare PPO | Admitting: Podiatry

## 2022-09-24 ENCOUNTER — Encounter: Payer: Self-pay | Admitting: Podiatry

## 2022-09-24 DIAGNOSIS — M79676 Pain in unspecified toe(s): Secondary | ICD-10-CM

## 2022-09-24 DIAGNOSIS — L84 Corns and callosities: Secondary | ICD-10-CM

## 2022-09-24 DIAGNOSIS — L97512 Non-pressure chronic ulcer of other part of right foot with fat layer exposed: Secondary | ICD-10-CM | POA: Diagnosis not present

## 2022-09-24 DIAGNOSIS — B351 Tinea unguium: Secondary | ICD-10-CM | POA: Diagnosis not present

## 2022-09-25 NOTE — Progress Notes (Signed)
She presents today for follow-up of her superficial ulceration dorsal aspect fifth digit right foot.  States that seems to be doing better and keeping it covered all the time.  Toenails are also long and painful.  Objective: Vital signs stable alert oriented x 3 ulceration fifth digit of the right foot appears to be healing very nicely compared to when it was in previously.  Does not demonstrate any signs of infection does not appear to be probing deep margins appear to be contracting.  Toenails are long thick yellow dystrophic onychomycotic.  Assessment: Pain in limb secondary to onychomycosis resolving ulcerative lesion dorsal aspect right foot.  Plan: Debrided nails debrided lesion placed padding today.  Follow-up with her in about 2 months.  She will watch for worsening symptoms of that fifth digit.  If that should arise she will notify us immediately.

## 2022-09-30 DIAGNOSIS — F429 Obsessive-compulsive disorder, unspecified: Secondary | ICD-10-CM | POA: Diagnosis not present

## 2022-10-08 DIAGNOSIS — M8580 Other specified disorders of bone density and structure, unspecified site: Secondary | ICD-10-CM | POA: Diagnosis not present

## 2022-10-08 DIAGNOSIS — Z5181 Encounter for therapeutic drug level monitoring: Secondary | ICD-10-CM | POA: Diagnosis not present

## 2022-10-08 DIAGNOSIS — M810 Age-related osteoporosis without current pathological fracture: Secondary | ICD-10-CM | POA: Diagnosis not present

## 2022-10-08 DIAGNOSIS — Z9181 History of falling: Secondary | ICD-10-CM | POA: Diagnosis not present

## 2022-10-08 DIAGNOSIS — M21372 Foot drop, left foot: Secondary | ICD-10-CM | POA: Diagnosis not present

## 2022-10-08 DIAGNOSIS — Z8781 Personal history of (healed) traumatic fracture: Secondary | ICD-10-CM | POA: Diagnosis not present

## 2022-10-08 DIAGNOSIS — K219 Gastro-esophageal reflux disease without esophagitis: Secondary | ICD-10-CM | POA: Diagnosis not present

## 2022-10-09 ENCOUNTER — Ambulatory Visit: Payer: Medicare PPO | Admitting: Podiatry

## 2022-10-16 DIAGNOSIS — Z780 Asymptomatic menopausal state: Secondary | ICD-10-CM | POA: Diagnosis not present

## 2022-10-16 DIAGNOSIS — M810 Age-related osteoporosis without current pathological fracture: Secondary | ICD-10-CM | POA: Diagnosis not present

## 2022-10-22 ENCOUNTER — Ambulatory Visit
Admission: RE | Admit: 2022-10-22 | Discharge: 2022-10-22 | Disposition: A | Discharge status: Home / Self Care | Payer: Medicare PPO | Source: Ambulatory Visit | Attending: Obstetrics and Gynecology | Admitting: Obstetrics and Gynecology

## 2022-10-22 DIAGNOSIS — Z1231 Encounter for screening mammogram for malignant neoplasm of breast: Secondary | ICD-10-CM | POA: Diagnosis not present

## 2022-10-28 DIAGNOSIS — F429 Obsessive-compulsive disorder, unspecified: Secondary | ICD-10-CM | POA: Diagnosis not present

## 2022-11-07 ENCOUNTER — Encounter: Payer: Self-pay | Admitting: Cardiovascular Disease

## 2022-11-07 NOTE — Progress Notes (Unsigned)
Cardiology Office Note   Date:  11/09/2022   ID:  Kristin Mai, PhD, DOB 1940/08/17, MRN [id number]  PCP:  Garlan Fillers, MD  Cardiologist:   Kristeen Miss, MD   Chief Complaint  Patient presents with   Congestive Heart Failure     CAD      1. CHF- LV EF has improved dramatically 2. Hyperlipidemia 3. Left hip replacement   82 year old female with history of congestive heart failure. Her original ejection fraction was 10-20%. Her ejection fraction has dramatically improved and is now 57% ( Echo 2009). She had an echocardiogram in February 2011 that revealed normal left ventricle systolic function.   She's had 2 heart catheterizations. Her last cardiac cath was in 2006 which revealed moderate irregularities.   She's not having any episodes of chest pain or shortness breath. She works out a regular basis at least 2-3 times a week.  She has gained 20 lbs over the past several months.  She has a foot ulcer in her left foot and has not been in the pool.  She's not had any angina-like chest pain. She did have some stress-related chest discomfort around Christmastime but that has since resolved.  October 18, 2012:  Kristin Mack has done well for the past 6 months.  She has not had any dyspnea.  She has had lots of left hip pain and has not been able to exercise as much.   She is swimming 4 times a week.  Nov. 24, 2014:  Kristin Mack is doing well.  She has been having some acid reflux ( dry cough).    She has been trying eating smaller portions, earing earlier in the evening, avoiding spicy foods.   May 27,2015:  Kristin Mack is doing well.  No  CP or dyspnea.    Stressed out over family issues.   Her sons were living with her for several months.  She has gone up on her Effexor.  She has had some left thigh problems.    Dec. 1, 2015:  Kristin Mack is seen today for follow up of her CHF.    She has done well.    She has not been swimming as much recently - has an ulcer on the bottom of  her left great toe.   Oct 30, 2014:  Kristin Mai, PhD is a 82 y.o. female who presents for  Follow up of her CHF. Is getting over a cold. Has gained some weight.  Is back exercising.  Does water exercises.  Likes to go line dancing .   No CP , breathing is ok.  Has lots of stress with her 85 yo son.   Nov. 8,  2016:  Doing well from a cardiac standpoint.  Lots of issues with GERD. Lots of stress ,    No Cp or dyspnea.   November 22, 2015:  Doing well.  Has gained 13 lbs since her last OV in Nov.  She is very frustrated with her 32 year old son who still lives in the house with her.   Breathing is good.   Swim 3 times  a week.    Dec. 6, 2017  Kristin Mack is seen today for a preoperative evaluation. She needs to have a right shoulder surgery and right carpal tunnel surgery.  She is doing well from a cardiac standpoint.   Has brief Chest pain / left shoulder pain .    Pains last for 2-3 minutes.   Not related to  exercise - may occur after swimming.    Sept, 4, 2018:  Kristin Mack is seen today for follow up of her CAD Breathing is good .  Has been eating a bit of extra salt.   Going to the dentist  Myoview study in Dec. 2017 shows a fixed apical defect thought to be breast attenuation.  Has not had any cp. Did not have the shoulder surgery   Nov 08, 2017: Today for follow-up of her coronary artery disease.  She is had some systolic congestive heart failure in the past but is overall doing great currently. No CP or dyspnea Has an ulcer on her left great toe.  Started as a callous which she clipped off with sissors .   Got infected.  She met up with an old boyfriend , walked quite a bit at the [facility]  No CP   Is not avoiding salt as much as she should .    Wt = 230 today   May 03, 2018: Kristin Mack seen today for follow-up visit. Weight today is 228 pounds. Has not been exercising in the pool  ( due to toe ulcer )  Its now healed.  Breathing is ok She was on the  incorrect dose of Lisinopril for several months Now is on the right dose.  Has some atypical CP with arm movement -  No angina   January 02, 2019:  Kristin Mack is doing well.   Has not been swimming Has missed the exercise,   Has been eating more take out food Admits to eating more salt than she should  Nov 02, 2019:  Kristin Mack is doing well.  n  Wt today is 231 lbs.  Continues to have issues with her right knee.  No CP or dyspnea.   Is not getting as much exercise as she should .   Overall is doing well .  No cp or dyspnea   May 10, 2020:  Kristin Mack is seen today for a follow-up visit of her congestive heart failure.  Her weight today is 212 pounds. Has lost 20 lbs since last visit  Lisinopril has been changed to Losartan  Is avoiding the [facility] pool since she had cataract surgery  Breathing is ok.   Echocardiogram from June, 2021 reveals mildly depressed left ventricular systolic function with an ejection fraction of 45 to 50%.  This EF is unchanged from her previous echo in 2014.  She has grade 1 diastolic dysfunction.  May 06, 2021: Kristin Mack is seen today for follow-up visit of her congestive heart failure.  Her weight today is 208 lbs  No CP or dyspnea. Has some fatigue  Swimming regularly, 1600 times in 20 years   March 31, 2022: Kristin Mack is seen today for follow-up visit.  She has a history of coronary artery disease and congestive heart failure.  LVEF is 45 to 50%.  Weight today was 196 pounds which is down 12 pounds from last November.  Is here for pre op evaluation prior to R carpal  tunnel surgery  No CP , no dyspnea  She is at low risk for her upcoming carpal tunnel surgery.  She may hold her aspirin for 5 to 7 days prior to her surgery.  Restart aspirin as soon as it is deemed safe from surgical standpoint.  Water exercises 3-4 times a week ( an hour each time )  self directed  No cp    Nov. 20, 2023 Kristin Mack is seen to discuss her upcoming hip surgery.  Had R  carpal tunnel surgery.   Had questions about the CV risks associated with her hip surgery  No cp ,  No dyspnea   Is on Losartan only 3 days a week ( because of hyperkalemai)  We discussed Entresto I would like for her to be over her carple tunnel surgery and over the hip surgery before we change meds that could potentially cause hypotension  Her LV function is only mildly depressed  She is at low risk for her hip replacement . She may hold her ASA for 5-7 days prior to her surgery .   Nov 09, 2022 Kristin Mack is seen for follow up of her CHF  Wt is 186 lbs .   Has steadily been losing weight  Exercising without any chest  Swims at the [facility]  3-4 times a week.  Eating fish 3 times a week  - salmon   We discussed entresto. She would like to try it  Will DC lasix DC losartan Start Entresto 24-26 BID  BMP in 2-3 weeks   Will follow up with [place] Benjamine Sprague, NP) in 3 months     Past Medical History:  Diagnosis Date   Carpal tunnel syndrome    Left Hand   Chest pain    CHF (congestive heart failure) (HCC)    Dilated cardiomyopathy. Her original ejection fraction was 10-20%. Her ejection fraction has improved to 57% by 2009   Coronary artery disease    Depression    Dyslipidemia    Foot drop, left    GERD (gastroesophageal reflux disease)    Headache(784.0)    Hemorrhoids    History of anxiety    Hypertension    Lower back pain    Myocardial infarction (HCC)    Obesity    Osteoarthritis    Severe with collapse of the left hip   Single functional kidney    single right kidney,  left kidney is atrophic    Past Surgical History:  Procedure Laterality Date   BREAST BIOPSY Right    x2   CARDIAC CATHETERIZATION     Ejection Fraction was around 45-50%   HYSTEROSCOPY  09/03/2011   Procedure: HYSTEROSCOPY;  Surgeon: Oliver Pila, MD;  Location: [facility];  Service: Gynecology;  Laterality: N/A;  With Resection of polyp with truclear system   REPLACEMENT TOTAL HIP W/   RESURFACING IMPLANTS  2002   left   TUBAL LIGATION       Current Outpatient Medications  Medication Sig Dispense Refill   acetaminophen (TYLENOL) 500 MG tablet 1 tab po bid PRN     aspirin 81 MG tablet Take 81 mg by mouth daily.      atorvastatin (LIPITOR) 40 MG tablet Take 40 mg by mouth daily.  1   calcium citrate-vitamin D (CITRACAL+D) 315-200 MG-UNIT tablet Take by mouth.     carvedilol (COREG) 12.5 MG tablet TAKE 1 TABLET BY MOUTH TWICE DAILY WITH A MEAL 180 tablet 3   Cholecalciferol (VITAMIN D3) 50 MCG (2000 UT) TABS Take 2,000 Units by mouth daily.     clobetasol cream (TEMOVATE) 0.05 % Apply 1 application topically 2 (two) times daily.      denosumab (PROLIA) 60 MG/ML SOSY injection Inject 60 mg into the skin every 6 (six) months.     estradiol (ESTRACE) 0.1 MG/GM vaginal cream estradiol 0.01% (0.1 mg/gram) vaginal cream apply locally 3 times per week 42.5 g 0   famotidine (PEPCID) 20 MG tablet Take 20  mg by mouth 2 (two) times daily.     furosemide (LASIX) 40 MG tablet TAKE 1 TABLET(40 MG) BY MOUTH DAILY 90 tablet 3   gentamicin cream (GARAMYCIN) 0.1 % Apply 1 Application topically 2 (two) times daily. 30 g 1   latanoprost (XALATAN) 0.005 % ophthalmic solution Place 1 drop into both eyes at bedtime.  0   LORazepam (ATIVAN) 0.5 MG tablet Take 0.5 mg by mouth as needed for anxiety.      losartan (COZAAR) 50 MG tablet Take 50 mg by mouth daily. On Monday Wednesday and Friday     melatonin 5 MG TABS Take 1 tablet by mouth at bedtime.     Multiple Vitamins-Minerals (CENTRUM SILVER ULTRA WOMENS) TABS Take 1 tablet by mouth daily.     naproxen (NAPROSYN) 500 MG tablet Take by mouth.     nitroGLYCERIN (NITROSTAT) 0.4 MG SL tablet PLACE 1 TABLET UNDER THE TONGUE AS NEEDED EVERY 5 MINUES FOR CHEST FOR PAIN 25 tablet 11   PRESCRIPTION MEDICATION Place 1 mL vaginally as directed. inser 1 ml vaginally 2-3 times per week as directed  Estradiol Micronized  0.02% cream (is compounded for  patient)     risperiDONE (RISPERDAL) 1 MG tablet Take 1 tablet by mouth daily.     sodium fluoride (FLUORISHIELD) 1.1 % GEL dental gel PreviDent 5000 Booster Plus 1.1 % dental paste     venlafaxine XR (EFFEXOR-XR) 75 MG 24 hr capsule Take 3 capsules by mouth daily.     amoxicillin (AMOXIL) 500 MG capsule Take 4 capsules by mouth as needed. 1 hr prior to dental work (Patient not taking: Reported on 09/16/2022)     doxycycline (VIBRAMYCIN) 100 MG capsule Take 100 mg by mouth 2 (two) times daily. (Patient not taking: Reported on 09/16/2022)     methocarbamol (ROBAXIN) 500 MG tablet TAKE 1 TABLET BY MOUTH EVERY 8 HOURS FOR MUSCLE RELAXATION (Patient not taking: Reported on 09/16/2022)     moxifloxacin (VIGAMOX) 0.5 % ophthalmic solution  (Patient not taking: Reported on 09/16/2022)     mupirocin ointment (BACTROBAN) 2 % Apply 1 application topically 2 (two) times daily. (Patient not taking: Reported on 09/16/2022) 30 g 2   oxyCODONE (OXY IR/ROXICODONE) 5 MG immediate release tablet Take 5 mg by mouth every 4 (four) hours as needed. (Patient not taking: Reported on 09/16/2022)     silver sulfADIAZINE (SILVADENE) 1 % cream Apply 1 Application topically daily. (Patient not taking: Reported on 11/09/2022) 25 g 1   No current facility-administered medications for this visit.    Allergies:   Celecoxib, Vigamox [moxifloxacin], and Nickel    Social History:  The patient  reports that she has never smoked. She has never used smokeless tobacco. She reports that she does not drink alcohol and does not use drugs.   Family History:  The patient's family history includes Cancer in her sister; Stroke in her maternal grandfather and mother.    ROS:   Noted in current history, otherwise review of systems is negative.  Physical Exam: Blood pressure 102/60, pulse 67, height 5\' 5"  (1.651 m), weight 186 lb (84.4 kg), SpO2 96 %.       GEN:  Well nourished, well developed in no acute distress HEENT: Normal NECK: No  JVD; No carotid bruits LYMPHATICS: No lymphadenopathy CARDIAC: RRR , no murmurs, rubs, gallops RESPIRATORY:  Clear to auscultation without rales, wheezing or rhonchi  ABDOMEN: Soft, non-tender, non-distended MUSCULOSKELETAL:  No edema; No deformity  SKIN: Warm and dry  NEUROLOGIC:  Alert and oriented x 3    EKG:        Recent Labs: No results found for requested labs within last 365 days.    Lipid Panel    Component Value Date/Time   CHOL 150 05/06/2021 1047   TRIG 102 05/06/2021 1047   HDL 52 05/06/2021 1047   CHOLHDL 2.9 05/06/2021 1047   CHOLHDL 2.7 05/27/2016 1044   VLDL 23 05/27/2016 1044   LDLCALC 79 05/06/2021 1047      Wt Readings from Last 3 Encounters:  11/09/22 186 lb (84.4 kg)  05/11/22 194 lb (88 kg)  03/31/22 196 lb 6.4 oz (89.1 kg)      Other studies Reviewed: Additional studies/ records that were reviewed today include: . Review of the above records demonstrates:    ASSESSMENT AND PLAN:  1. Chronic systolic CHF-        We discussed entresto. She would like to try it  Will DC lasix DC losartan Start Entresto 24-26 BID  BMP in 2-3 weeks   Will follow up with [place] Benjamine Sprague, NP) in 3 months     2. CAD :    no angina    3. Hyperlipidemia -          3. Carpal Tunnel syndrome:  s/p surgery            Current medicines are reviewed at length with the patient today.  The patient does not have concerns regarding medicines.  The following changes have been made:  no change  Labs/ tests ordered today include:   No orders of the defined types were placed in this encounter.    Disposition:   FU with Eligha Bridegroom, NP in 3 months     Kristeen Miss, MD  11/09/2022 11:20 AM    [facility] [address] [place], [place], [place]  [address] Phone: [phone]; Fax: [phone]

## 2022-11-09 ENCOUNTER — Encounter: Payer: Self-pay | Admitting: Cardiovascular Disease

## 2022-11-09 ENCOUNTER — Ambulatory Visit: Payer: Medicare PPO | Attending: Cardiovascular Disease | Admitting: Cardiovascular Disease

## 2022-11-09 VITALS — BP 102/60 | HR 67 | Ht 65.0 in | Wt 186.0 lb

## 2022-11-09 DIAGNOSIS — I5042 Chronic combined systolic (congestive) and diastolic (congestive) heart failure: Secondary | ICD-10-CM | POA: Diagnosis not present

## 2022-11-09 MED ORDER — ENTRESTO 24-26 MG PO TABS: 1.0000 | ORAL_TABLET | Freq: Two times a day (BID) | ORAL | 3 refills | Status: DC

## 2022-11-09 NOTE — Patient Instructions (Addendum)
Medication Instructions:  Your physician has recommended you make the following change in your medication:  1) STOP taking losartan  2) STOP taking Lasix (furosemide) 3) START taking Entresto 24-26 mg twice daily   *If you need a refill on your cardiac medications before your next appointment, please call your pharmacy*  Lab Work: IN 2-3 WEEKS: BMET  Follow-Up: At [facility], you and your health needs are our priority.  As part of our continuing mission to provide you with exceptional heart care, we have created designated Provider Care Teams.  These Care Teams include your primary Cardiologist (physician) and Advanced Practice Providers (APPs -  Physician Assistants and Nurse Practitioners) who all work together to provide you with the care you need, when you need it.  Your next appointment:   3 month(s)  Provider:   Eligha Bridegroom, NP

## 2022-11-11 DIAGNOSIS — F429 Obsessive-compulsive disorder, unspecified: Secondary | ICD-10-CM | POA: Diagnosis not present

## 2022-11-18 ENCOUNTER — Ambulatory Visit: Payer: Medicare PPO | Admitting: Podiatry

## 2022-11-18 ENCOUNTER — Telehealth: Payer: Self-pay | Admitting: Podiatry

## 2022-11-18 ENCOUNTER — Encounter: Payer: Self-pay | Admitting: Podiatry

## 2022-11-18 DIAGNOSIS — M21372 Foot drop, left foot: Secondary | ICD-10-CM

## 2022-11-18 DIAGNOSIS — L97522 Non-pressure chronic ulcer of other part of left foot with fat layer exposed: Secondary | ICD-10-CM | POA: Diagnosis not present

## 2022-11-18 NOTE — Progress Notes (Signed)
  Subjective:  Patient ID: Kristin Mai, PhD, female    DOB: 1941-03-26,   MRN: [id number]  Chief Complaint  Patient presents with   Callouses    RM 21 Left great toe blister/ulcer. Pt states pain with pressure only. No drainage or bleeding.     82 y.o. female presents for new concern of blister on her left great toe that started on Monday. Relates she developed the blister and was concerned. She has not been dressing it. Has a history of ulcerations in the past.  . Denies any other pedal complaints. Denies n/v/f/c.   Past Medical History:  Diagnosis Date   Carpal tunnel syndrome    Left Hand   Chest pain    CHF (congestive heart failure) (HCC)    Dilated cardiomyopathy. Her original ejection fraction was 10-20%. Her ejection fraction has improved to 57% by 2009   Coronary artery disease    Depression    Dyslipidemia    Foot drop, left    GERD (gastroesophageal reflux disease)    Headache(784.0)    Hemorrhoids    History of anxiety    Hypertension    Lower back pain    Myocardial infarction (HCC)    Obesity    Osteoarthritis    Severe with collapse of the left hip   Single functional kidney    single right kidney,  left kidney is atrophic    Objective:  Physical Exam: Vascular: DP/PT pulses 2/4 bilateral. CFT <3 seconds. Normal hair growth on digits. No edema.  Skin. No lacerations or abrasions bilateral feet. Ulceration noted to distal left hallux underlying hyperkeratosis about 0.5 cm x 0.7 cm x 0.2 cm with granular base. No erythema edema or purulence noted.  Musculoskeletal: MMT 5/5 bilateral lower extremities in PF, Inversion and Eversion. 3/5 in DF. Deceased ROM in DF of ankle joint HAV deformity noted on left and hammertoes deformities noted as well.  Neurological: Sensation intact to light touch.   Assessment:   1. Toe ulcer, left, with fat layer exposed (HCC)   2. Foot drop, left      Plan:  Patient was evaluated and treated and all questions  answered. Ulcer Left great toe with fat layer exposed  -Debridement as below. -Dressed with betadine, DSD. -Off-loading with surgical shoe and AFO.  -No abx indicated.  -Discussed glucose control and proper protein-rich diet.  -Discussed if any worsening redness, pain, fever or chills to call or may need to report to the emergency room. Patient expressed understanding.   Procedure: Excisional Debridement of Wound Rationale: Removal of non-viable soft tissue from the wound to promote healing.  Anesthesia: none Pre-Debridement Wound Measurements: Overlying callus  Post-Debridement Wound Measurements: 0.5 cm x 0.7 cm x 0.2 cm  Type of Debridement: Sharp Excisional Tissue Removed: Non-viable soft tissue Depth of Debridement: subcutaneous tissue. Technique: Sharp excisional debridement to bleeding, viable wound base.  Dressing: Dry, sterile, compression dressing. Disposition: Patient tolerated procedure well. Patient to return in 2 week for follow-up.  Return in about 2 weeks (around 12/02/2022) for wound check.   Louann Sjogren, DPM

## 2022-11-18 NOTE — Telephone Encounter (Signed)
Patient called and left VM on RN line stating that she was not sure where to get the betadine or what to do with it. She was not sure if it is OTC or prescription. She was not sure if she should soak it or what to do with it. Please call patient.

## 2022-11-24 ENCOUNTER — Ambulatory Visit: Payer: Medicare PPO | Admitting: Podiatry

## 2022-11-25 ENCOUNTER — Other Ambulatory Visit: Payer: Self-pay | Admitting: Podiatry

## 2022-11-25 MED ORDER — DOXYCYCLINE HYCLATE 100 MG PO TABS: 100.0000 mg | ORAL_TABLET | Freq: Two times a day (BID) | ORAL | 0 refills | Status: DC

## 2022-11-26 ENCOUNTER — Ambulatory Visit: Payer: Medicare PPO | Attending: Cardiovascular Disease

## 2022-11-26 DIAGNOSIS — I5042 Chronic combined systolic (congestive) and diastolic (congestive) heart failure: Secondary | ICD-10-CM | POA: Diagnosis not present

## 2022-11-26 DIAGNOSIS — H401123 Primary open-angle glaucoma, left eye, severe stage: Secondary | ICD-10-CM | POA: Diagnosis not present

## 2022-11-26 DIAGNOSIS — H33302 Unspecified retinal break, left eye: Secondary | ICD-10-CM | POA: Diagnosis not present

## 2022-11-26 DIAGNOSIS — H401111 Primary open-angle glaucoma, right eye, mild stage: Secondary | ICD-10-CM | POA: Diagnosis not present

## 2022-11-26 LAB — BASIC METABOLIC PANEL
BUN/Creatinine Ratio: 22 (ref 12–28)
BUN: 16 mg/dL (ref 8–27)
CO2: 27 mmol/L (ref 20–29)
Calcium: 9.8 mg/dL (ref 8.7–10.3)
Chloride: 101 mmol/L (ref 96–106)
Creatinine, Ser: 0.73 mg/dL (ref 0.57–1.00)
Glucose: 95 mg/dL (ref 70–99)
Potassium: 5.1 mmol/L (ref 3.5–5.2)
Sodium: 138 mmol/L (ref 134–144)
eGFR: 84 mL/min/{1.73_m2} (ref >59)

## 2022-12-01 ENCOUNTER — Ambulatory Visit: Payer: Medicare PPO | Admitting: Podiatry

## 2022-12-01 DIAGNOSIS — L97522 Non-pressure chronic ulcer of other part of left foot with fat layer exposed: Secondary | ICD-10-CM | POA: Diagnosis not present

## 2022-12-01 DIAGNOSIS — B351 Tinea unguium: Secondary | ICD-10-CM | POA: Diagnosis not present

## 2022-12-01 DIAGNOSIS — M79676 Pain in unspecified toe(s): Secondary | ICD-10-CM | POA: Diagnosis not present

## 2022-12-01 NOTE — Progress Notes (Unsigned)
She presents today for follow-up of her ulceration saw Dr. Juanetta Gosling 2 weeks ago and the ulcer is now open patient states that she has been doing pretty well thought she saw some pus that is called and an aspirin antibiotic she is currently taking doxycycline.  Her nails are also long and painful.  Objective: Vital signs are stable alert oriented x 3.  There is no erythema edema salines drainage or odor 5 mm ulcerative lesion to the plantar medial aspect of the interphalangeal joint hallux left.  Does not probe deep granulation tissue is present no purulence no malodor no signs of infection.  Toenails are long thick yellow dystrophic onychomycotic.  Assessment: Pain in limb secondary to onychomycosis chronic ulceration medial aspect of the left hallux.  Plan: Continue the antibiotic continue iodine application twice daily and dressing change.  Follow-up with her in 1 month debrided toenails 1 through 5 bilateral.

## 2022-12-02 ENCOUNTER — Ambulatory Visit: Payer: Medicare PPO | Admitting: Podiatry

## 2022-12-09 DIAGNOSIS — F429 Obsessive-compulsive disorder, unspecified: Secondary | ICD-10-CM | POA: Diagnosis not present

## 2022-12-11 ENCOUNTER — Telehealth: Payer: Self-pay | Admitting: Cardiovascular Disease

## 2022-12-11 DIAGNOSIS — I2510 Atherosclerotic heart disease of native coronary artery without angina pectoris: Secondary | ICD-10-CM

## 2022-12-11 NOTE — Telephone Encounter (Signed)
Returned call to patient and informed that per Dr Harvie Bridge OV notes in 2017-she does have CAD, but not mentioned since 2018 and not prior to 2017. She is taking Atorvastatin and Aspirin, so on correct therapy if so. Additionally, ECHO report from 2021 showed aortic valve sclerosis, so likely some coronary calcium build-up present. She asks if we would order a CAC CT. Informed her what that was and it's fee and she would like to proceed, but wants this done at [facility]. Order placed at this time.

## 2022-12-11 NOTE — Telephone Encounter (Signed)
Patient calling to see if she has coronary heart disease, calcium her arteries, and if she is a candidate for a CT scan. Please advise

## 2022-12-14 ENCOUNTER — Telehealth: Payer: Self-pay | Admitting: Cardiovascular Disease

## 2022-12-14 NOTE — Telephone Encounter (Signed)
Returned call to patient to inform again that the CAC CT is out-of-pocket for $99 and that Francine Graven will not pay for it. Informed that the order has been placed as of last week, but is only done at select [facility] locations (not all) and that I could ask our scheduling team to reach out to [facility] to ensure that they see the order, but that it would be GI that actually schedules her for it. She is fine with the $99, but wonders now "if I'm putting the cart before the horse." Explained that while the CT is usually never a bad idea, she does have an appt scheduled with Swinyer, NP in two months and she can get her professional opinion of if any insight is likely to be gleaned by doing it. She agrees and will speak with her about this before proceeding.

## 2022-12-14 NOTE — Telephone Encounter (Signed)
Patient calling to request her lab results from 6/6 be mailed to her. She would also like to know if Dr. Elease Hashimoto could send an order to [facility] for a CT scan for CAC and CAB. She says [facility] called her Friday and informed her they need an order sent to them. She would also like to know how much it would cost her.

## 2022-12-14 NOTE — Telephone Encounter (Signed)
Pt called in again stating she'd like MD to send over a PA for CT for CAC to [facility]. Please advise

## 2022-12-15 DIAGNOSIS — R82998 Other abnormal findings in urine: Secondary | ICD-10-CM | POA: Diagnosis not present

## 2022-12-15 DIAGNOSIS — E785 Hyperlipidemia, unspecified: Secondary | ICD-10-CM | POA: Diagnosis not present

## 2022-12-15 DIAGNOSIS — I10 Essential (primary) hypertension: Secondary | ICD-10-CM | POA: Diagnosis not present

## 2022-12-15 DIAGNOSIS — Z0000 Encounter for general adult medical examination without abnormal findings: Secondary | ICD-10-CM | POA: Diagnosis not present

## 2022-12-15 DIAGNOSIS — N390 Urinary tract infection, site not specified: Secondary | ICD-10-CM | POA: Diagnosis not present

## 2022-12-15 DIAGNOSIS — E559 Vitamin D deficiency, unspecified: Secondary | ICD-10-CM | POA: Diagnosis not present

## 2022-12-15 DIAGNOSIS — E11621 Type 2 diabetes mellitus with foot ulcer: Secondary | ICD-10-CM | POA: Diagnosis not present

## 2022-12-16 DIAGNOSIS — F339 Major depressive disorder, recurrent, unspecified: Secondary | ICD-10-CM | POA: Diagnosis not present

## 2022-12-17 ENCOUNTER — Encounter: Payer: Self-pay | Admitting: Podiatry

## 2022-12-17 ENCOUNTER — Ambulatory Visit: Payer: Medicare PPO | Admitting: Podiatry

## 2022-12-17 DIAGNOSIS — L84 Corns and callosities: Secondary | ICD-10-CM | POA: Diagnosis not present

## 2022-12-17 DIAGNOSIS — L97521 Non-pressure chronic ulcer of other part of left foot limited to breakdown of skin: Secondary | ICD-10-CM | POA: Diagnosis not present

## 2022-12-17 NOTE — Progress Notes (Signed)
Chief Complaint  Patient presents with   Foot Ulcer    Follow up ulcer hallux left   "Its still got that little hole and I would like to go back to my water classes"   HPI: 82 y.o. female presents today after requesting this appointment to have a recurring callus, that has history of prior ulceration, evaluated on her left great toe.  She does have a previously diagnosed hallux valgus deformity.  Denies pain today.  She is interested in getting back to her water exercise classes.  She notes that she does wear water shoes when in the pool.  Past Medical History:  Diagnosis Date   Carpal tunnel syndrome    Left Hand   Chest pain    CHF (congestive heart failure) (HCC)    Dilated cardiomyopathy. Her original ejection fraction was 10-20%. Her ejection fraction has improved to 57% by 2009   Coronary artery disease    Depression    Dyslipidemia    Foot drop, left    GERD (gastroesophageal reflux disease)    Headache(784.0)    Hemorrhoids    History of anxiety    Hypertension    Lower back pain    Myocardial infarction (HCC)    Obesity    Osteoarthritis    Severe with collapse of the left hip   Single functional kidney    single right kidney,  left kidney is atrophic    Past Surgical History:  Procedure Laterality Date   BREAST BIOPSY Right    x2   CARDIAC CATHETERIZATION     Ejection Fraction was around 45-50%   HYSTEROSCOPY  09/03/2011   Procedure: HYSTEROSCOPY;  Surgeon: Oliver Pila, MD;  Location: [facility];  Service: Gynecology;  Laterality: N/A;  With Resection of polyp with truclear system   REPLACEMENT TOTAL HIP W/  RESURFACING IMPLANTS  2002   left   TUBAL LIGATION      Allergies  Allergen Reactions   Celecoxib Other (See Comments)   Vigamox [Moxifloxacin] Other (See Comments)   Nickel Itching and Rash    Other reaction(s): Not available    Physical Exam: There were no vitals filed for this visit.  There are palpable pedal pulses on the left foot.   The area of previous ulceration is located on the plantar medial aspect of the left hallux IPJ.  There is a hyperkeratotic rim, and where the ulceration was previously, has filled in with eschar tissue.  This is dry and stable.  No active drainage is noted.  No necrosis is seen.  There are no signs of infection.  Assessment/Plan of Care: 1. Pre-ulcerative calluses   2. Skin ulcer of left foot, limited to breakdown of skin [facility])     Discussed clinical findings with patient today.  The hyperkeratotic tissue was sharply shaved with a sterile #313 blade.  The eschar was not removed and instructed the patient to let this slowly slough away on its own as it performed a protective barrier to the area.  Betadine ointment and a light dressing was applied.  She can remove this later today.  She can use Tegaderm over the area when in the pool while wearing her water shoes.  She will keep a close eye on the area to look for any signs of re-ulceration  Follow-up as needed  Clerance Lav, DPM, FACFAS [facility]     [address]  [place], [place] [address]                Office [phone]  Fax [phone]

## 2022-12-20 ENCOUNTER — Encounter: Payer: Self-pay | Admitting: Podiatry

## 2022-12-22 ENCOUNTER — Ambulatory Visit: Payer: Medicare PPO | Admitting: Podiatry

## 2022-12-29 DIAGNOSIS — Z1339 Encounter for screening examination for other mental health and behavioral disorders: Secondary | ICD-10-CM | POA: Diagnosis not present

## 2022-12-29 DIAGNOSIS — M546 Pain in thoracic spine: Secondary | ICD-10-CM | POA: Diagnosis not present

## 2022-12-29 DIAGNOSIS — M1611 Unilateral primary osteoarthritis, right hip: Secondary | ICD-10-CM | POA: Diagnosis not present

## 2022-12-29 DIAGNOSIS — Z1331 Encounter for screening for depression: Secondary | ICD-10-CM | POA: Diagnosis not present

## 2022-12-29 DIAGNOSIS — I2510 Atherosclerotic heart disease of native coronary artery without angina pectoris: Secondary | ICD-10-CM | POA: Diagnosis not present

## 2022-12-29 DIAGNOSIS — E11621 Type 2 diabetes mellitus with foot ulcer: Secondary | ICD-10-CM | POA: Diagnosis not present

## 2022-12-29 DIAGNOSIS — Z0000 Encounter for general adult medical examination without abnormal findings: Secondary | ICD-10-CM | POA: Diagnosis not present

## 2022-12-29 DIAGNOSIS — L97521 Non-pressure chronic ulcer of other part of left foot limited to breakdown of skin: Secondary | ICD-10-CM | POA: Diagnosis not present

## 2022-12-29 DIAGNOSIS — I5042 Chronic combined systolic (congestive) and diastolic (congestive) heart failure: Secondary | ICD-10-CM | POA: Diagnosis not present

## 2022-12-29 DIAGNOSIS — I429 Cardiomyopathy, unspecified: Secondary | ICD-10-CM | POA: Diagnosis not present

## 2022-12-29 DIAGNOSIS — I110 Hypertensive heart disease with heart failure: Secondary | ICD-10-CM | POA: Diagnosis not present

## 2022-12-30 DIAGNOSIS — F429 Obsessive-compulsive disorder, unspecified: Secondary | ICD-10-CM | POA: Diagnosis not present

## 2023-01-13 DIAGNOSIS — F429 Obsessive-compulsive disorder, unspecified: Secondary | ICD-10-CM | POA: Diagnosis not present

## 2023-01-27 DIAGNOSIS — F429 Obsessive-compulsive disorder, unspecified: Secondary | ICD-10-CM | POA: Diagnosis not present

## 2023-01-28 ENCOUNTER — Encounter: Payer: Self-pay | Admitting: Podiatry

## 2023-01-28 ENCOUNTER — Ambulatory Visit: Payer: Medicare PPO | Admitting: Podiatry

## 2023-01-28 DIAGNOSIS — L97521 Non-pressure chronic ulcer of other part of left foot limited to breakdown of skin: Secondary | ICD-10-CM | POA: Diagnosis not present

## 2023-01-28 NOTE — Progress Notes (Signed)
She presents today for follow-up of her ulceration to the plantar medial aspect of the left hallux.  She states that it was starting get a little concerned so he came in early.  She denies fever chills nausea vomit muscle aches and pains.  She states that she follows the area every day and puts her light dressing on it with her silicone sleeve.  Objective: Superficial ulceration measuring 2 mm x 3 mm does demonstrate a rim of hyperkeratosis the epithelization is occurring and very very superficial.  Assessment: Well healing ulcerative lesion medial aspect hallux left.  Plan: Trim the reactive hyperkeratotic tissue today started Betadine solution and a dry sterile dressing will follow-up with her the first of next month.

## 2023-02-01 ENCOUNTER — Other Ambulatory Visit (HOSPITAL_COMMUNITY): Payer: Self-pay | Admitting: Family Medicine

## 2023-02-01 DIAGNOSIS — N2889 Other specified disorders of kidney and ureter: Secondary | ICD-10-CM | POA: Diagnosis not present

## 2023-02-01 DIAGNOSIS — R1032 Left lower quadrant pain: Secondary | ICD-10-CM | POA: Diagnosis not present

## 2023-02-01 DIAGNOSIS — M546 Pain in thoracic spine: Secondary | ICD-10-CM | POA: Diagnosis not present

## 2023-02-01 DIAGNOSIS — N390 Urinary tract infection, site not specified: Secondary | ICD-10-CM | POA: Diagnosis not present

## 2023-02-01 DIAGNOSIS — M1611 Unilateral primary osteoarthritis, right hip: Secondary | ICD-10-CM | POA: Diagnosis not present

## 2023-02-02 ENCOUNTER — Ambulatory Visit (HOSPITAL_COMMUNITY): Payer: Medicare PPO

## 2023-02-02 ENCOUNTER — Encounter: Payer: Self-pay | Admitting: Family Medicine

## 2023-02-02 ENCOUNTER — Ambulatory Visit
Admission: RE | Admit: 2023-02-02 | Discharge: 2023-02-02 | Disposition: A | Discharge status: Home / Self Care | Payer: Medicare PPO | Source: Ambulatory Visit | Attending: Family Medicine | Admitting: Family Medicine

## 2023-02-02 ENCOUNTER — Encounter (HOSPITAL_COMMUNITY): Payer: Self-pay

## 2023-02-02 DIAGNOSIS — R109 Unspecified abdominal pain: Secondary | ICD-10-CM | POA: Diagnosis not present

## 2023-02-02 DIAGNOSIS — R1032 Left lower quadrant pain: Secondary | ICD-10-CM

## 2023-02-02 DIAGNOSIS — I700 Atherosclerosis of aorta: Secondary | ICD-10-CM | POA: Diagnosis not present

## 2023-02-02 DIAGNOSIS — K449 Diaphragmatic hernia without obstruction or gangrene: Secondary | ICD-10-CM | POA: Diagnosis not present

## 2023-02-02 MED ORDER — IOPAMIDOL (ISOVUE-300) INJECTION 61%
500.0000 mL | Freq: Once | INTRAVENOUS | Status: AC | PRN
  Administered 2023-02-02: 100 mL via INTRAVENOUS

## 2023-02-03 ENCOUNTER — Other Ambulatory Visit: Payer: Self-pay | Admitting: Internal Medicine

## 2023-02-03 DIAGNOSIS — N289 Disorder of kidney and ureter, unspecified: Secondary | ICD-10-CM

## 2023-02-08 NOTE — Progress Notes (Unsigned)
Cardiology Office Note:  .   Date:  02/09/2023  ID:  Dorna Mai, PhD, DOB Dec 12, 1940, MRN [id number] PCP: Garlan Fillers, MD  [facility] Providers Cardiologist:  Kristeen Miss, MD    Patient Profile: .      PMH Dyslipidemia Chronic HFrEF CAD Moderate irregularities on cath 2006 Low risk myoview 05/2016 with possible breast attenuation versus apical MI which would have been in the remote past Obesity Hyperkalemia  She had an ejection fraction of 10 to 20% when she was seen initially.  She had dramatic improvement in 2009 with EF 57%. Heart catheterization in 2006 revealed moderate irregularities.  She had a low risk Myoview in 2017 with question of breast attenuation versus very remote apical MI. Normal ABIs  11/2017.   Most recent echocardiogram 11/2019 with LVEF 45 to 50%, G1 DD, normal RV, mild aortic sclerosis without evidence of aortic stenosis, no significant change from previous echo in 2014. She has maintained consistent follow-up.  Last cardiology clinic visit was 11/09/2022 with Dr. Elease Hashimoto.  She had been losing weight due to increased exercise.  No chest pain with exertion.  She elected to discontinue losartan and start Entresto 24-26 mg twice daily. She was advised to return in 3 months for follow-up. BMP on 11/26/22 revealed stable renal function and electrolytes.       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 female who is here today for follow-up. She reports increased nighttime urination since starting Entresto. No change in symptoms since changing medications. States she did not previously have significant shortness of breath, edema, or activity intolerance. [facility] [place] [facility] and using the [employer]. She has not been walking as much due to injury to foot wound for which she is wearing a brace. She does not monitor home weight. Voiced concern that diastolic BP is 50 mmHg  today. She has rare lightheadedness, no presyncope or syncope. She denies chest pain, orthopnea, PND, edema, palpitations.   ROS: See HPI       Studies Reviewed: .         Risk Assessment/Calculations:             Physical Exam:   VS:  BP (!) 108/50   Pulse 67   Ht 5\' 6"  (1.676 m)   Wt 191 lb (86.6 kg)   SpO2 95%   BMI 30.83 kg/m    Wt Readings from Last 3 Encounters:  02/09/23 191 lb (86.6 kg)  11/09/22 186 lb (84.4 kg)  05/11/22 194 lb (88 kg)    GEN: Well nourished, well developed in no acute distress NECK: No JVD; No carotid bruits CARDIAC: RRR, no murmurs, rubs, gallops RESPIRATORY:  Clear to auscultation without rales, wheezing or rhonchi  ABDOMEN: Soft, non-tender, non-distended EXTREMITIES:  No edema; No deformity     ASSESSMENT AND PLAN: .    Chronic combined CHF: LVEF 45-50%, G1DD on echo 12/14/2019. ARB was changed to [facility] 11/09/22. She has tolerated well with no specific side effects but is having increased nocturnal urination. She is not monitoring her sodium intake and admits to eating out frequently. She is not monitoring daily weight. Appears euvolemic today. She denies dyspnea, orthopnea, PND, and edema. Have encouraged her to weigh daily, limit fluids to 2L daily (mostly water), and to limit sodium to < 2000 mg daily. We will recheck renal function and electrolytes tomorrow when  she can return for fasting labs. Continue Entresto, carvedilol.   CAD without angina: Moderate irregularities on cath 2006. Low risk myoview 05/2016 with possible breast attenuation versus apical MI which would have been in the remote past. She denies chest pain, dyspnea, or other symptoms concerning for angina. No indication for further ischemic evaluation at this time. We will recheck cholesterol to ensure it is still well controlled. No bleeding concerns.  Continue aspirin, carvedilol, atorvastatin.  Hypertension: BP has been well controlled. She is concerned about occasional soft  BPs. Rare episodes of lightheadedness have occurred, but no presyncope or syncope. She continues to exercise without significant lightheadedness. Reassurance provided.   Hyperlipidemia LDL goal < 70: LDL 68 on 10/14/21. We will recheck tomorrow when she can return fasting. Continue atorvastatin.        Dispo: 6 months with Dr. Elease Hashimoto or me   Signed, Eligha Bridegroom, NP-C

## 2023-02-09 ENCOUNTER — Encounter: Payer: Self-pay | Admitting: Nurse Practitioner

## 2023-02-09 ENCOUNTER — Ambulatory Visit: Payer: Medicare PPO | Admitting: Nurse Practitioner

## 2023-02-09 VITALS — BP 108/50 | HR 67 | Ht 66.0 in | Wt 191.0 lb

## 2023-02-09 DIAGNOSIS — I10 Essential (primary) hypertension: Secondary | ICD-10-CM | POA: Diagnosis not present

## 2023-02-09 DIAGNOSIS — I5042 Chronic combined systolic (congestive) and diastolic (congestive) heart failure: Secondary | ICD-10-CM | POA: Diagnosis not present

## 2023-02-09 DIAGNOSIS — I2510 Atherosclerotic heart disease of native coronary artery without angina pectoris: Secondary | ICD-10-CM | POA: Diagnosis not present

## 2023-02-09 DIAGNOSIS — E785 Hyperlipidemia, unspecified: Secondary | ICD-10-CM | POA: Diagnosis not present

## 2023-02-09 DIAGNOSIS — F429 Obsessive-compulsive disorder, unspecified: Secondary | ICD-10-CM | POA: Diagnosis not present

## 2023-02-09 NOTE — Patient Instructions (Signed)
Medication Instructions:   Your physician recommends that you continue on your current medications as directed. Please refer to the Current Medication list given to you today.   *If you need a refill on your cardiac medications before your next appointment, please call your pharmacy*   Lab Work:  Your physician recommends that you return for a FASTING lipid profile/cmet, tomorrow, Wednesday, August 31. You can come in on the day of your appointment anytime between 7:30-4:30 fasting from midnight the night before.     If you have labs (blood work) drawn today and your tests are completely normal, you will receive your results only by: MyChart Message (if you have MyChart) OR A paper copy in the mail If you have any lab test that is abnormal or we need to change your treatment, we will call you to review the results.   Testing/Procedures:  None ordered.   Follow-Up: At [facility], you and your health needs are our priority.  As part of our continuing mission to provide you with exceptional heart care, we have created designated Provider Care Teams.  These Care Teams include your primary Cardiologist (physician) and Advanced Practice Providers (APPs -  Physician Assistants and Nurse Practitioners) who all work together to provide you with the care you need, when you need it.  We recommend signing up for the patient portal called "MyChart".  Sign up information is provided on this After Visit Summary.  MyChart is used to connect with patients for Virtual Visits (Telemedicine).  Patients are able to view lab/test results, encounter notes, upcoming appointments, etc.  Non-urgent messages can be sent to your provider as well.   To learn more about what you can do with MyChart, go to [email or web address].    Your next appointment:   6 month(s)  Provider:   Kristeen Miss, MD     Other Instructions  Heart Failure Education: Weigh yourself EVERY morning after you go to the  bathroom but before you eat or drink anything. Write this number down in a weight log/diary. If you gain 3 pounds overnight or 5 pounds in a week, call the office. Take your medicines as prescribed. If you have concerns about your medications, please call us before you stop taking them.  Eat low salt foods--Limit salt (sodium) to 2000 mg per day. This will help prevent your body from holding onto fluid. Read food labels as many processed foods have a lot of sodium, especially canned goods and prepackaged meats. If you would like some assistance choosing low sodium foods, we would be happy to set you up with a nutritionist. Stay as active as you can everyday. Staying active will give you more energy and make your muscles stronger. Start with 5 minutes at a time and work your way up to 30 minutes a day. Break up your activities--do some in the morning and some in the afternoon. Start with 3 days per week and work your way up to 5 days as you can.  If you have chest pain, feel short of breath, dizzy, or lightheaded, STOP. If you don't feel better after a short rest, call 911. If you do feel better, call the office to let us know you have symptoms with exercise. Limit all fluids for the day to less than 2 liters. Fluid includes all drinks, coffee, juice, ice chips, soup, jello, and all other liquids.       Mediterranean Diet  Why follow it? Research shows. Those who follow the Mediterranean  diet have a reduced risk of heart disease  The diet is associated with a reduced incidence of Parkinson's and Alzheimer's diseases People following the diet may have longer life expectancies and lower rates of chronic diseases  The Dietary Guidelines for Americans recommends the Mediterranean diet as an eating plan to promote health and prevent disease  What Is the Mediterranean Diet?  Healthy eating plan based on typical foods and recipes of Mediterranean-style cooking The diet is primarily a plant based diet; these  foods should make up a majority of meals   Starches - Plant based foods should make up a majority of meals - They are an important sources of vitamins, minerals, energy, antioxidants, and fiber - Choose whole grains, foods high in fiber and minimally processed items  - Typical grain sources include wheat, oats, barley, corn, brown rice, bulgar, farro, millet, polenta, couscous  - Various types of beans include chickpeas, lentils, fava beans, black beans, white beans   Fruits  Veggies - Large quantities of antioxidant rich fruits & veggies; 6 or more servings  - Vegetables can be eaten raw or lightly drizzled with oil and cooked  - Vegetables common to the traditional Mediterranean Diet include: artichokes, arugula, beets, broccoli, brussel sprouts, cabbage, carrots, celery, collard greens, cucumbers, eggplant, kale, leeks, lemons, lettuce, mushrooms, okra, onions, peas, peppers, potatoes, pumpkin, radishes, rutabaga, shallots, spinach, sweet potatoes, turnips, zucchini - Fruits common to the Mediterranean Diet include: apples, apricots, avocados, cherries, clementines, dates, figs, grapefruits, grapes, melons, nectarines, oranges, peaches, pears, pomegranates, strawberries, tangerines  Fats - Replace butter and margarine with healthy oils, such as olive oil, canola oil, and tahini  - Limit nuts to no more than a handful a day  - Nuts include walnuts, almonds, pecans, pistachios, pine nuts  - Limit or avoid candied, honey roasted or heavily salted nuts - Olives are central to the [employer] - can be eaten whole or used in a variety of dishes   Meats Protein - Limiting red meat: no more than a few times a month - When eating red meat: choose lean cuts and keep the portion to the size of deck of cards - Eggs: approx. 0 to 4 times a week  - Fish and lean poultry: at least 2 a week  - Healthy protein sources include, chicken, [place], lean beef, lamb - Increase intake of seafood such as tuna,  salmon, trout, mackerel, shrimp, scallops - Avoid or limit high fat processed meats such as sausage and bacon  Dairy - Include moderate amounts of low fat dairy products  - Focus on healthy dairy such as fat free yogurt, skim milk, low or reduced fat cheese - Limit dairy products higher in fat such as whole or 2% milk, cheese, ice cream  Alcohol - Moderate amounts of red wine is ok  - No more than 5 oz daily for women (all ages) and men older than age 50  - No more than 10 oz of wine daily for men younger than 1  Other - Limit sweets and other desserts  - Use herbs and spices instead of salt to flavor foods  - Herbs and spices common to the traditional Mediterranean Diet include: basil, bay leaves, chives, cloves, cumin, fennel, garlic, lavender, marjoram, mint, oregano, parsley, pepper, rosemary, sage, savory, sumac, tarragon, thyme   It's not just a diet, it's a lifestyle:  The Mediterranean diet includes lifestyle factors typical of those in the region  Foods, drinks and meals are best eaten  with others and savored Daily physical activity is important for overall good health This could be strenuous exercise like running and aerobics This could also be more leisurely activities such as walking, housework, yard-work, or taking the stairs Moderation is the key; a balanced and healthy diet accommodates most foods and drinks Consider portion sizes and frequency of consumption of certain foods   Meal Ideas & Options:  Breakfast:  Whole wheat toast or whole wheat English muffins with peanut butter & hard boiled egg Steel cut oats topped with apples & cinnamon and skim milk  Fresh fruit: banana, strawberries, melon, berries, peaches  Smoothies: strawberries, bananas, greek yogurt, peanut butter Low fat greek yogurt with blueberries and granola  Egg white omelet with spinach and mushrooms Breakfast couscous: whole wheat couscous, apricots, skim milk, cranberries  Sandwiches:  Hummus and  grilled vegetables (peppers, zucchini, squash) on whole wheat bread   Grilled chicken on whole wheat pita with lettuce, tomatoes, cucumbers or tzatziki  [place] salad on whole wheat bread: tuna salad made with greek yogurt, olives, red peppers, capers, green onions Garlic rosemary lamb pita: lamb sauted with garlic, rosemary, salt & pepper; add lettuce, cucumber, greek yogurt to pita - flavor with lemon juice and black pepper  Seafood:  Mediterranean grilled salmon, seasoned with garlic, basil, parsley, lemon juice and black pepper Shrimp, lemon, and spinach whole-grain pasta salad made with low fat greek yogurt  Seared scallops with lemon orzo  Seared tuna steaks seasoned salt, pepper, coriander topped with tomato mixture of olives, tomatoes, olive oil, minced garlic, parsley, green onions and cappers  Meats:  Herbed greek chicken salad with kalamata olives, cucumber, feta  Red bell peppers stuffed with spinach, bulgur, lean ground beef (or lentils) & topped with feta   Kebabs: skewers of chicken, tomatoes, onions, zucchini, squash  [place] burgers: made with red onions, mint, dill, lemon juice, feta cheese topped with roasted red peppers Vegetarian Cucumber salad: cucumbers, artichoke hearts, celery, red onion, feta cheese, tossed in olive oil & lemon juice  Hummus and whole grain pita points with a greek salad (lettuce, tomato, feta, olives, cucumbers, red onion) Lentil soup with celery, carrots made with vegetable broth, garlic, salt and pepper  Tabouli salad: parsley, bulgur, mint, scallions, cucumbers, tomato, radishes, lemon juice, olive oil, salt and pepper. Adopting a Healthy Lifestyle.   Weight: Know what a healthy weight is for you (roughly BMI <25) and aim to maintain this. You can calculate your body mass index on your smart phone. Unfortunately, this is not the most accurate measure of healthy weight, but it is the simplest measurement to use. A more accurate measurement involves body  scanning which measures lean muscle, fat tissue and bony density. We do not have this equipment at [facility].    Diet: Aim for 7+ servings of fruits and vegetables daily Limit animal fats in diet for cholesterol and heart health - choose grass fed whenever available Avoid highly processed foods (fast food burgers, tacos, fried chicken, pizza, hot dogs, french fries)  Saturated fat comes in the form of butter, lard, coconut oil, margarine, partially hydrogenated oils, and fat in meat. These increase your risk of cardiovascular disease.  Use healthy plant oils, such as olive, canola, soy, corn, sunflower and peanut.  Whole foods such as fruits, vegetables and whole grains have fiber  Men need > 38 grams of fiber per day Women need > 25 grams of fiber per day  Load up on vegetables and fruits - one-half of your  plate: Aim for color and variety, and remember that potatoes dont count. Go for whole grains - one-quarter of your plate: Whole wheat, barley, wheat berries, quinoa, oats, brown rice, and foods made with them. If you want pasta, go with whole wheat pasta. Protein power - one-quarter of your plate: Fish, chicken, beans, and nuts are all healthy, versatile protein sources. Limit red meat. You need carbohydrates for energy! The type of carbohydrate is more important than the amount. Choose carbohydrates such as vegetables, fruits, whole grains, beans, and nuts in the place of white rice, white pasta, potatoes (baked or fried), macaroni and cheese, cakes, cookies, and donuts.  If youre thirsty, drink water. Coffee and tea are good in moderation, but skip sugary drinks and limit milk and dairy products to one or two daily servings. Keep sugar intake at 6 teaspoons or 24 grams or LESS       Exercise: Aim for 150 min of moderate intensity exercise weekly for heart health, and weights twice weekly for bone health Stay active - any steps are better than no steps! Aim for 7-9 hours of sleep daily       DASH Eating Plan DASH stands for Dietary Approaches to Stop Hypertension. The DASH eating plan is a healthy eating plan that has been shown to: Lower high blood pressure (hypertension). Reduce your risk for type 2 diabetes, heart disease, and stroke. Help with weight loss. What are tips for following this plan? Reading food labels Check food labels for the amount of salt (sodium) per serving. Choose foods with less than 5 percent of the Daily Value (DV) of sodium. In general, foods with less than 300 milligrams (mg) of sodium per serving fit into this eating plan. To find whole grains, look for the word "whole" as the first word in the ingredient list. Shopping Buy products labeled as "low-sodium" or "no salt added." Buy fresh foods. Avoid canned foods and pre-made or frozen meals. Cooking Try not to add salt when you cook. Use salt-free seasonings or herbs instead of table salt or sea salt. Check with your health care provider or pharmacist before using salt substitutes. Do not fry foods. Cook foods in healthy ways, such as baking, boiling, grilling, roasting, or broiling. Cook using oils that are good for your heart. These include olive, canola, avocado, soybean, and sunflower oil. Meal planning  Eat a balanced diet. This should include: 4 or more servings of fruits and 4 or more servings of vegetables each day. Try to fill half of your plate with fruits and vegetables. 6-8 servings of whole grains each day. 6 or less servings of lean meat, poultry, or fish each day. 1 oz is 1 serving. A 3 oz (85 g) serving of meat is about the same size as the palm of your hand. One egg is 1 oz (28 g). 2-3 servings of low-fat dairy each day. One serving is 1 cup (237 mL). 1 serving of nuts, seeds, or beans 5 times each week. 2-3 servings of heart-healthy fats. Healthy fats called omega-3 fatty acids are found in foods such as walnuts, flaxseeds, fortified milks, and eggs. These fats are also found in  cold-water fish, such as sardines, salmon, and mackerel. Limit how much you eat of: Canned or prepackaged foods. Food that is high in trans fat, such as fried foods. Food that is high in saturated fat, such as fatty meat. Desserts and other sweets, sugary drinks, and other foods with added sugar. Full-fat dairy products. Do  not salt foods before eating. Do not eat more than 4 egg yolks a week. Try to eat at least 2 vegetarian meals a week. Eat more home-cooked food and less restaurant, buffet, and fast food. Lifestyle When eating at a restaurant, ask if your food can be made with less salt or no salt. If you drink alcohol: Limit how much you have to: 0-1 drink a day if you are female. 0-2 drinks a day if you are female. Know how much alcohol is in your drink. In the [place] one drink is one 12 oz bottle of beer (355 mL), one 5 oz glass of wine (148 mL), or one 1 oz glass of hard liquor (44 mL). General information Avoid eating more than 2,300 mg of salt a day. If you have hypertension, you may need to reduce your sodium intake to 1,500 mg a day. Work with your provider to stay at a healthy body weight or lose weight. Ask what the best weight range is for you. On most days of the week, get at least 30 minutes of exercise that causes your heart to beat faster. This may include walking, swimming, or biking. Work with your provider or dietitian to adjust your eating plan to meet your specific calorie needs. What foods should I eat? Fruits All fresh, dried, or frozen fruit. Canned fruits that are in their natural juice and do not have sugar added to them. Vegetables Fresh or frozen vegetables that are raw, steamed, roasted, or grilled. Low-sodium or reduced-sodium tomato and vegetable juice. Low-sodium or reduced-sodium tomato sauce and tomato paste. Low-sodium or reduced-sodium canned vegetables. Grains Whole-grain or whole-wheat bread. Whole-grain or whole-wheat pasta. Brown rice. Orpah Cobb. Bulgur. Whole-grain and low-sodium cereals. Pita bread. Low-fat, low-sodium crackers. Whole-wheat flour tortillas. Meats and other proteins Skinless chicken or [place]. Ground chicken or [place]. Pork with fat trimmed off. Fish and seafood. Egg whites. Dried beans, peas, or lentils. Unsalted nuts, nut butters, and seeds. Unsalted canned beans. Lean cuts of beef with fat trimmed off. Low-sodium, lean precooked or cured meat, such as sausages or meat loaves. Dairy Low-fat (1%) or fat-free (skim) milk. Reduced-fat, low-fat, or fat-free cheeses. Nonfat, low-sodium ricotta or cottage cheese. Low-fat or nonfat yogurt. Low-fat, low-sodium cheese. Fats and oils Soft margarine without trans fats. Vegetable oil. Reduced-fat, low-fat, or light mayonnaise and salad dressings (reduced-sodium). Canola, safflower, olive, avocado, soybean, and sunflower oils. Avocado. Seasonings and condiments Herbs. Spices. Seasoning mixes without salt. Other foods Unsalted popcorn and pretzels. Fat-free sweets. The items listed above may not be all the foods and drinks you can have. Talk to a dietitian to learn more. What foods should I avoid? Fruits Canned fruit in a light or heavy syrup. Fried fruit. Fruit in cream or butter sauce. Vegetables Creamed or fried vegetables. Vegetables in a cheese sauce. Regular canned vegetables that are not marked as low-sodium or reduced-sodium. Regular canned tomato sauce and paste that are not marked as low-sodium or reduced-sodium. Regular tomato and vegetable juices that are not marked as low-sodium or reduced-sodium. Rosita Fire. Olives. Grains Baked goods made with fat, such as croissants, muffins, or some breads. Dry pasta or rice meal packs. Meats and other proteins Fatty cuts of meat. Ribs. Fried meat. Tomasa Blase. Bologna, salami, and other precooked or cured meats, such as sausages or meat loaves, that are not lean and low in sodium. Fat from the back of a pig (fatback). Bratwurst.  Salted nuts and seeds. Canned beans with added salt. Canned or smoked fish.  Whole eggs or egg yolks. Chicken or [place] with skin. Dairy Whole or 2% milk, cream, and half-and-half. Whole or full-fat cream cheese. Whole-fat or sweetened yogurt. Full-fat cheese. Nondairy creamers. Whipped toppings. Processed cheese and cheese spreads. Fats and oils Butter. Stick margarine. Lard. Shortening. Ghee. Bacon fat. Tropical oils, such as coconut, palm kernel, or palm oil. Seasonings and condiments Onion salt, garlic salt, seasoned salt, table salt, and sea salt. Worcestershire sauce. Tartar sauce. Barbecue sauce. Teriyaki sauce. Soy sauce, including reduced-sodium soy sauce. Steak sauce. Canned and packaged gravies. Fish sauce. Oyster sauce. Cocktail sauce. Store-bought horseradish. Ketchup. Mustard. Meat flavorings and tenderizers. Bouillon cubes. Hot sauces. Pre-made or packaged marinades. Pre-made or packaged taco seasonings. Relishes. Regular salad dressings. Other foods Salted popcorn and pretzels. The items listed above may not be all the foods and drinks you should avoid. Talk to a dietitian to learn more. Where to find more information National Heart, Lung, and Blood Institute (NHLBI): BuffaloDryCleaner.gl American Heart Association (AHA): heart.org Academy of Nutrition and Dietetics: eatright.org National Kidney Foundation (NKF): kidney.org This information is not intended to replace advice given to you by your health care provider. Make sure you discuss any questions you have with your health care provider. Document Revised: 06/25/2022 Document Reviewed: 06/25/2022 Elsevier Patient Education  2024 [employer].

## 2023-02-10 ENCOUNTER — Ambulatory Visit: Payer: Medicare PPO | Attending: Internal Medicine

## 2023-02-10 DIAGNOSIS — I5042 Chronic combined systolic (congestive) and diastolic (congestive) heart failure: Secondary | ICD-10-CM

## 2023-02-10 DIAGNOSIS — I2510 Atherosclerotic heart disease of native coronary artery without angina pectoris: Secondary | ICD-10-CM

## 2023-02-10 DIAGNOSIS — I10 Essential (primary) hypertension: Secondary | ICD-10-CM | POA: Diagnosis not present

## 2023-02-11 ENCOUNTER — Telehealth: Payer: Self-pay | Admitting: Cardiovascular Disease

## 2023-02-11 DIAGNOSIS — R7301 Impaired fasting glucose: Secondary | ICD-10-CM

## 2023-02-11 LAB — COMPREHENSIVE METABOLIC PANEL
ALT: 18 IU/L (ref 0–32)
AST: 21 IU/L (ref 0–40)
Albumin: 4.2 g/dL (ref 3.8–4.8)
Alkaline Phosphatase: 69 IU/L (ref 44–121)
BUN/Creatinine Ratio: 19 (ref 12–28)
BUN: 14 mg/dL (ref 8–27)
Bilirubin Total: 0.4 mg/dL (ref 0.0–1.2)
CO2: 26 mmol/L (ref 20–29)
Calcium: 9.3 mg/dL (ref 8.7–10.3)
Chloride: 103 mmol/L (ref 96–106)
Creatinine, Ser: 0.72 mg/dL (ref 0.57–1.00)
Globulin, Total: 2.2 g/dL (ref 1.5–4.5)
Glucose: 105 mg/dL — ABNORMAL HIGH (ref 70–99)
Potassium: 4.8 mmol/L (ref 3.5–5.2)
Sodium: 142 mmol/L (ref 134–144)
Total Protein: 6.4 g/dL (ref 6.0–8.5)
eGFR: 85 mL/min/{1.73_m2} (ref >59)

## 2023-02-11 LAB — LIPID PANEL
Chol/HDL Ratio: 2.7 ratio (ref 0.0–4.4)
Cholesterol, Total: 155 mg/dL (ref 100–199)
HDL: 57 mg/dL (ref >39)
LDL Chol Calc (NIH): 79 mg/dL (ref 0–99)
Triglycerides: 106 mg/dL (ref 0–149)
VLDL Cholesterol Cal: 19 mg/dL (ref 5–40)

## 2023-02-11 NOTE — Telephone Encounter (Signed)
Patient is calling to get results. Please call back

## 2023-02-11 NOTE — Telephone Encounter (Signed)
Comments from [facility] Dr. Shelah Lewandowsky, your LDL cholesterol remains just above the goal of < 70. The other components of your cholesterol panel are well controlled, including your triglycerides. I would recommend that you work on improving your diet as we discussed in the last office visit. Your liver and kidney function are stable. Your blood sugar was mildly elevated at the time of the lab work. A better test for diabetes is an A1C, so if your PCP has not done one recently, I would recommend that test.   Left message to call office

## 2023-02-11 NOTE — Telephone Encounter (Signed)
Patient notified.  She has not had A1C checked anywhere else and would like to have checked in our office.  She will come in on August 26 for lab work

## 2023-02-15 ENCOUNTER — Ambulatory Visit: Payer: Medicare PPO | Attending: Internal Medicine

## 2023-02-15 ENCOUNTER — Encounter: Payer: Self-pay | Admitting: Internal Medicine

## 2023-02-17 ENCOUNTER — Encounter: Payer: Self-pay | Admitting: Podiatry

## 2023-02-17 ENCOUNTER — Ambulatory Visit: Payer: Medicare PPO | Admitting: Podiatry

## 2023-02-17 DIAGNOSIS — L97521 Non-pressure chronic ulcer of other part of left foot limited to breakdown of skin: Secondary | ICD-10-CM

## 2023-02-18 NOTE — Progress Notes (Signed)
Kristin Mack presents today concerned of ulceration forefoot hallux left.  She states that it just looks more open again than it did.  Objective: Vital signs are stable she is alert and oriented x 3.  There is no erythema cellulitis drainage or odor she has small open wound does not even measure depth on it with some mild reactive hyperkeratosis surrounding it.  The wound itself measures 0.3 cm in diameter there is nice granulation tissue and bleeding.  No signs of infection.  Assessment: Debrided wound hallux left.  Hallux deformity left.  Plan: Debrided the wound for her today and redressed it Silvadene cream dressed a compressive dressing provided her with a silicone sleeve.

## 2023-02-19 ENCOUNTER — Inpatient Hospital Stay: Admission: RE | Admit: 2023-02-19 | Payer: Medicare PPO | Source: Ambulatory Visit

## 2023-02-19 ENCOUNTER — Ambulatory Visit
Admission: RE | Admit: 2023-02-19 | Discharge: 2023-02-19 | Disposition: A | Discharge status: Home / Self Care | Payer: Medicare PPO | Source: Ambulatory Visit | Attending: Internal Medicine | Admitting: Internal Medicine

## 2023-02-19 DIAGNOSIS — K449 Diaphragmatic hernia without obstruction or gangrene: Secondary | ICD-10-CM | POA: Diagnosis not present

## 2023-02-19 DIAGNOSIS — R932 Abnormal findings on diagnostic imaging of liver and biliary tract: Secondary | ICD-10-CM | POA: Diagnosis not present

## 2023-02-19 DIAGNOSIS — N289 Disorder of kidney and ureter, unspecified: Secondary | ICD-10-CM

## 2023-02-19 MED ORDER — GADOPICLENOL 0.5 MMOL/ML IV SOLN
9.0000 mL | Freq: Once | INTRAVENOUS | Status: AC | PRN
  Administered 2023-02-19: 9 mL via INTRAVENOUS

## 2023-02-23 ENCOUNTER — Ambulatory Visit: Payer: Medicare PPO | Admitting: Podiatry

## 2023-02-23 ENCOUNTER — Ambulatory Visit: Payer: Medicare PPO | Attending: Nurse Practitioner

## 2023-02-23 DIAGNOSIS — R7301 Impaired fasting glucose: Secondary | ICD-10-CM | POA: Diagnosis not present

## 2023-02-24 DIAGNOSIS — F429 Obsessive-compulsive disorder, unspecified: Secondary | ICD-10-CM | POA: Diagnosis not present

## 2023-02-24 LAB — HEMOGLOBIN A1C
Est. average glucose Bld gHb Est-mCnc: 94 mg/dL
Hgb A1c MFr Bld: 4.9 % (ref 4.8–5.6)

## 2023-03-02 ENCOUNTER — Encounter: Payer: Self-pay | Admitting: Podiatry

## 2023-03-02 ENCOUNTER — Ambulatory Visit: Payer: Medicare PPO | Admitting: Podiatry

## 2023-03-02 DIAGNOSIS — L97521 Non-pressure chronic ulcer of other part of left foot limited to breakdown of skin: Secondary | ICD-10-CM | POA: Diagnosis not present

## 2023-03-02 DIAGNOSIS — B351 Tinea unguium: Secondary | ICD-10-CM

## 2023-03-02 DIAGNOSIS — M79676 Pain in unspecified toe(s): Secondary | ICD-10-CM | POA: Diagnosis not present

## 2023-03-02 NOTE — Progress Notes (Signed)
She presents today for follow-up of her ulceration plantar medial hallux left.  She states that she still been dressing it daily states that the callus really has not built up that much.  But the toenails are long and painful.  Objective: Vital signs stable alert oriented x 3 pulses remain palpable.  Hallux valgus deformity with interphalangeal measures resulted in skin breakdown along the medial aspect of the toe.  Toenails are thick yellow dystrophic onychomycotic.  Assessment: Chronic ulceration plantar medial aspect hallux left.  Pain in limb secondary to painful mycotic nails.  Plan: Discussed etiology pathology and surgical therapies at this point I debrided the ulceration with a chisel blade to bleeding measuring only about 3 mm x 3 mm and the area was down debrided of an denuded of all benign skin.  Debrided toenails 1 through 5 bilateral.

## 2023-03-10 DIAGNOSIS — F429 Obsessive-compulsive disorder, unspecified: Secondary | ICD-10-CM | POA: Diagnosis not present

## 2023-03-11 ENCOUNTER — Other Ambulatory Visit (INDEPENDENT_AMBULATORY_CARE_PROVIDER_SITE_OTHER): Payer: Self-pay

## 2023-03-11 ENCOUNTER — Ambulatory Visit: Payer: Medicare PPO | Admitting: Orthopaedic Surgery

## 2023-03-11 VITALS — Ht 66.0 in | Wt 187.0 lb

## 2023-03-11 DIAGNOSIS — M25552 Pain in left hip: Secondary | ICD-10-CM

## 2023-03-11 DIAGNOSIS — Z96642 Presence of left artificial hip joint: Secondary | ICD-10-CM

## 2023-03-11 DIAGNOSIS — M1611 Unilateral primary osteoarthritis, right hip: Secondary | ICD-10-CM | POA: Diagnosis not present

## 2023-03-11 DIAGNOSIS — M25551 Pain in right hip: Secondary | ICD-10-CM

## 2023-03-11 NOTE — Progress Notes (Signed)
The patient is a 82 year old female that I am seeing for the first time today with bilateral hip pain.  She has a history of a left hip replacement performed by one of my colleagues in town through a posterior approach over 20 years ago.  That has done well but she is starting to have some left hip and groin pain.  She is a patient of Dr. Ivery Quale for a long period time at [facility] and he sent her our way to look at her right hip and her left hip.  She reports some right hip pain but is not severe.  That pain seems to be in the groin.  She is not on blood thinning medications.  She is not a diabetic.  She takes aspirin daily.  I was able to review all of her medications and past medical history within epic.  She is on Prolia for osteoporosis.  She has her next injection in October.  She gets these about 6 months apart.  She recently had a CT scan of her abdomen pelvis because I thought there was diverticulitis but there was no issues that they can see on those films.  However I was able to look at her hip replacement and her right hip arthritis on that scan as well.  Both her hips move smoothly and fluidly with some pain in the groin on the right side and some pain over the lateral aspect of the right hip.  On her left side she does have chronic foot drop which she said was the result of her left hip replacement.  She has a leg length discrepancy on that side as well.  She does wear an AFO all the time on that left side due to her foot drop.  That left hip does move smoothly and fluidly with no blocks or rotation.  An AP pelvis and lateral both hips were reviewed today.  I see no evidence of osteolysis of the left hip.  The hip joint space is congruent in terms of the femoral head within the acetabulum.  The replacement itself looks good.  The right hip does show moderate osteoarthritis with para-articular osteophytes and joint space narrowing.  The CT scan of her right hip shows  worsening arthritis in terms of more severe.  There is cystic changes in the femoral head and osteophytes around the right hip.  I do not see any evidence of osteolysis around her left hip on the CT scan.  I do feel that some of her left hip pain may be compensating due to her foot drop combined with her right hip arthritis.  I did give her handout about right anterior hip replacement surgery.  I think she should wait at least about 3 months after the October Prolia injection to consider hip replacement surgery on the right side.  She is also dealing with an ulcer underneath her first MTP joint and podiatry is seeing this for her.  This is on the left side.  We want a make sure this is not involve the bone at any time.  I would like to see her back in November.  At that point we can work on potentially scheduling her for a right hip replacement.  We can also take a look at her left foot to make sure that wound is appropriately healing because if there is any evidence of bony involvement we would need to hold off on any type of joint replacement surgery until that  clears up.  She understands this as well.  All questions and concerns were answered and addressed.

## 2023-03-12 DIAGNOSIS — N390 Urinary tract infection, site not specified: Secondary | ICD-10-CM | POA: Diagnosis not present

## 2023-03-15 DIAGNOSIS — Z96642 Presence of left artificial hip joint: Secondary | ICD-10-CM | POA: Diagnosis not present

## 2023-03-15 DIAGNOSIS — M25552 Pain in left hip: Secondary | ICD-10-CM | POA: Diagnosis not present

## 2023-03-15 DIAGNOSIS — M1611 Unilateral primary osteoarthritis, right hip: Secondary | ICD-10-CM | POA: Diagnosis not present

## 2023-03-15 DIAGNOSIS — M25551 Pain in right hip: Secondary | ICD-10-CM | POA: Diagnosis not present

## 2023-03-23 DIAGNOSIS — H401123 Primary open-angle glaucoma, left eye, severe stage: Secondary | ICD-10-CM | POA: Diagnosis not present

## 2023-03-23 DIAGNOSIS — H401111 Primary open-angle glaucoma, right eye, mild stage: Secondary | ICD-10-CM | POA: Diagnosis not present

## 2023-03-24 DIAGNOSIS — F429 Obsessive-compulsive disorder, unspecified: Secondary | ICD-10-CM | POA: Diagnosis not present

## 2023-03-24 DIAGNOSIS — M1711 Unilateral primary osteoarthritis, right knee: Secondary | ICD-10-CM | POA: Diagnosis not present

## 2023-03-24 DIAGNOSIS — Z96642 Presence of left artificial hip joint: Secondary | ICD-10-CM | POA: Diagnosis not present

## 2023-03-24 DIAGNOSIS — M1611 Unilateral primary osteoarthritis, right hip: Secondary | ICD-10-CM | POA: Diagnosis not present

## 2023-04-01 ENCOUNTER — Encounter: Payer: Self-pay | Admitting: Podiatry

## 2023-04-01 ENCOUNTER — Ambulatory Visit: Payer: Medicare PPO | Admitting: Podiatry

## 2023-04-01 VITALS — BP 106/58 | HR 61 | Resp 18 | Ht 66.0 in | Wt 187.0 lb

## 2023-04-01 DIAGNOSIS — L97521 Non-pressure chronic ulcer of other part of left foot limited to breakdown of skin: Secondary | ICD-10-CM | POA: Diagnosis not present

## 2023-04-01 NOTE — Progress Notes (Signed)
Kristin Mack presents today for ulceration of the plantar medial aspect of the hallux left.  She is wants to make sure that there is no infection and trim the callus.  Objective: Pulses are palpable severe hallux valgus with rotation of the toe resulting in pressure ulcerative lesion on the plantar medial aspect of the interphalangeal joint hallux left.  No signs of infection.  I did debride the reactive hyperkeratotic tissue for her and there was no purulence noted.  Did not probe to bone.  Assessment: Ulceration hallux left.  Plan: Discussed etiology pathology and surgical therapies at this point went ahead and debrided all necrotic tissue and all reactive hyperkeratotic tissue and I will follow-up with her in 1 month.

## 2023-04-07 DIAGNOSIS — N390 Urinary tract infection, site not specified: Secondary | ICD-10-CM | POA: Diagnosis not present

## 2023-04-07 DIAGNOSIS — R310 Gross hematuria: Secondary | ICD-10-CM | POA: Diagnosis not present

## 2023-04-07 DIAGNOSIS — F429 Obsessive-compulsive disorder, unspecified: Secondary | ICD-10-CM | POA: Diagnosis not present

## 2023-04-09 DIAGNOSIS — S61412A Laceration without foreign body of left hand, initial encounter: Secondary | ICD-10-CM | POA: Diagnosis not present

## 2023-04-09 DIAGNOSIS — W19XXXA Unspecified fall, initial encounter: Secondary | ICD-10-CM | POA: Diagnosis not present

## 2023-04-12 ENCOUNTER — Telehealth: Payer: Self-pay

## 2023-04-12 DIAGNOSIS — I2510 Atherosclerotic heart disease of native coronary artery without angina pectoris: Secondary | ICD-10-CM

## 2023-04-12 NOTE — Telephone Encounter (Signed)
Order updated for patient to have Coronary calcium score CT at [facility] for $99 fee.

## 2023-04-12 NOTE — Telephone Encounter (Signed)
Pt called about test. I informed her it was scheduled for 10/25/ at 11:30. She is aware that it is self pay $99 and to arrive 15 minutes early.

## 2023-04-12 NOTE — Telephone Encounter (Signed)
-----   Message from Grinnell S sent at 04/12/2023  7:44 AM EDT ----- Regarding: RE: ct score Heyyy she was ok going to a [facility] facility. I was very clear with her on the pricing when talking with her on Friday. ----- Message ----- From: Lars Mage, RN Sent: 04/09/2023   5:39 PM EDT To: Aron Baba Subject: RE: ct score                                   Marchelle Folks, she specifically requested [facility] which is why it's ordered this way. She can still get it for $99, but it's the one at [facility] that's contracted with [facility]. I'll be happy to change it if she's willing to go to one of our locations, but we need to be clear with her. ----- Message ----- From: Aron Baba Sent: 04/09/2023   2:56 PM EDT To: Lars Mage, RN Subject: ct score                                       Hey! Is there anyway we can get the ct score allowed at a [facility] facility?? Pt doesn't want to pay 150. Compared to [facility]

## 2023-04-13 DIAGNOSIS — Z5181 Encounter for therapeutic drug level monitoring: Secondary | ICD-10-CM | POA: Diagnosis not present

## 2023-04-13 DIAGNOSIS — M810 Age-related osteoporosis without current pathological fracture: Secondary | ICD-10-CM | POA: Diagnosis not present

## 2023-04-16 ENCOUNTER — Ambulatory Visit (HOSPITAL_COMMUNITY)
Admission: RE | Admit: 2023-04-16 | Discharge: 2023-04-16 | Disposition: A | Discharge status: Home / Self Care | Payer: Medicare PPO | Source: Ambulatory Visit | Attending: Cardiovascular Disease | Admitting: Cardiovascular Disease

## 2023-04-16 DIAGNOSIS — I2510 Atherosclerotic heart disease of native coronary artery without angina pectoris: Secondary | ICD-10-CM | POA: Insufficient documentation

## 2023-04-21 ENCOUNTER — Telehealth: Payer: Self-pay

## 2023-04-21 DIAGNOSIS — F429 Obsessive-compulsive disorder, unspecified: Secondary | ICD-10-CM | POA: Diagnosis not present

## 2023-04-21 DIAGNOSIS — E785 Hyperlipidemia, unspecified: Secondary | ICD-10-CM

## 2023-04-21 DIAGNOSIS — I2510 Atherosclerotic heart disease of native coronary artery without angina pectoris: Secondary | ICD-10-CM

## 2023-04-21 MED ORDER — EZETIMIBE 10 MG PO TABS: 10.0000 mg | ORAL_TABLET | Freq: Every day | ORAL | 3 refills | Status: DC

## 2023-04-21 NOTE — Telephone Encounter (Signed)
Called and spoke in detail with patient about need to start zetia. All questions answered. Medication sent to [facility] per request. Labs entered and pt will come in 3 months to have collected. CT results printed and placed in mail per her request.

## 2023-04-21 NOTE — Telephone Encounter (Signed)
-----   Message from Kristeen Miss sent at 04/20/2023  9:39 PM EDT ----- She has known moderate CAD CAC score is 684 ( 85th percentile for age / sex matched controls  Her LDL goal is < 70 Most recent LDL is 7  Continue atorvastati Add zetia 10 mg a day  Check lipids,BMP  and ALT in 3 months

## 2023-04-22 ENCOUNTER — Other Ambulatory Visit: Payer: Medicare PPO

## 2023-04-23 DIAGNOSIS — Z4802 Encounter for removal of sutures: Secondary | ICD-10-CM | POA: Diagnosis not present

## 2023-04-28 ENCOUNTER — Ambulatory Visit: Payer: Medicare PPO | Admitting: Podiatry

## 2023-04-28 ENCOUNTER — Encounter: Payer: Self-pay | Admitting: Podiatry

## 2023-04-28 DIAGNOSIS — L97521 Non-pressure chronic ulcer of other part of left foot limited to breakdown of skin: Secondary | ICD-10-CM

## 2023-04-28 NOTE — Progress Notes (Signed)
She presents today about 1 week early she is concerned that she may have done something to her left foot.  She states that the ulcer seems to have split and possibly gotten bigger.  Really do not know what I did different with it other than I was using bag balm every other day with alternating Betadine.  Denies fever chills nausea vomit muscle aches pains.  Objective: Vital signs stable alert oriented x 3 hallux left demonstrate superficial ulceration with macerated tissue.  The ulcerative lesion does demonstrate some maceration which debrided today demonstrates about a 3 mm in diameter lesion appears to be superficial but the macerated tissue was sharply resected.  There is no signs of infection readily bleed will this should go on to heal.  Assessment: Ulceration hallux left.  Plan: Debrided today to bleeding increasing the size of the wound.  Placed Betadine solution dry sterile compressive dressing.  Provided her with a silicone toe shields.  Follow-up with her in approximately 1 month

## 2023-04-30 ENCOUNTER — Other Ambulatory Visit: Payer: Self-pay | Admitting: *Deleted

## 2023-04-30 ENCOUNTER — Ambulatory Visit: Payer: Medicare PPO | Attending: Cardiovascular Disease

## 2023-04-30 DIAGNOSIS — R310 Gross hematuria: Secondary | ICD-10-CM | POA: Diagnosis not present

## 2023-04-30 DIAGNOSIS — E785 Hyperlipidemia, unspecified: Secondary | ICD-10-CM | POA: Diagnosis not present

## 2023-04-30 DIAGNOSIS — R350 Frequency of micturition: Secondary | ICD-10-CM | POA: Diagnosis not present

## 2023-04-30 DIAGNOSIS — I2510 Atherosclerotic heart disease of native coronary artery without angina pectoris: Secondary | ICD-10-CM | POA: Diagnosis not present

## 2023-04-30 DIAGNOSIS — N3946 Mixed incontinence: Secondary | ICD-10-CM | POA: Diagnosis not present

## 2023-05-01 LAB — BASIC METABOLIC PANEL
BUN/Creatinine Ratio: 17 (ref 12–28)
BUN: 13 mg/dL (ref 8–27)
CO2: 27 mmol/L (ref 20–29)
Calcium: 9.1 mg/dL (ref 8.7–10.3)
Chloride: 102 mmol/L (ref 96–106)
Creatinine, Ser: 0.78 mg/dL (ref 0.57–1.00)
Glucose: 101 mg/dL — ABNORMAL HIGH (ref 70–99)
Potassium: 4.7 mmol/L (ref 3.5–5.2)
Sodium: 139 mmol/L (ref 134–144)
eGFR: 77 mL/min/{1.73_m2} (ref >59)

## 2023-05-01 LAB — LIPID PANEL
Chol/HDL Ratio: 2.1 ratio (ref 0.0–4.4)
Cholesterol, Total: 117 mg/dL (ref 100–199)
HDL: 56 mg/dL (ref >39)
LDL Chol Calc (NIH): 48 mg/dL (ref 0–99)
Triglycerides: 56 mg/dL (ref 0–149)
VLDL Cholesterol Cal: 13 mg/dL (ref 5–40)

## 2023-05-01 LAB — ALT: ALT: 14 [IU]/L (ref 0–32)

## 2023-05-04 ENCOUNTER — Ambulatory Visit: Payer: Medicare PPO | Admitting: Podiatry

## 2023-05-05 DIAGNOSIS — F429 Obsessive-compulsive disorder, unspecified: Secondary | ICD-10-CM | POA: Diagnosis not present

## 2023-05-07 DIAGNOSIS — H9202 Otalgia, left ear: Secondary | ICD-10-CM | POA: Diagnosis not present

## 2023-05-11 ENCOUNTER — Telehealth: Payer: Self-pay

## 2023-05-11 NOTE — Telephone Encounter (Signed)
Patient called and left a message - she is doing the betadine dressing in the mornings - she was confused about night time - she she do the betadine again? Or something else? Or leave it open to the air? -please advise

## 2023-05-18 DIAGNOSIS — F429 Obsessive-compulsive disorder, unspecified: Secondary | ICD-10-CM | POA: Diagnosis not present

## 2023-05-27 ENCOUNTER — Ambulatory Visit: Payer: Medicare PPO | Admitting: Podiatry

## 2023-05-27 DIAGNOSIS — L97521 Non-pressure chronic ulcer of other part of left foot limited to breakdown of skin: Secondary | ICD-10-CM | POA: Diagnosis not present

## 2023-05-27 MED ORDER — AMOXICILLIN-POT CLAVULANATE 875-125 MG PO TABS: 1.0000 | ORAL_TABLET | Freq: Two times a day (BID) | ORAL | 0 refills | Status: DC

## 2023-05-27 MED ORDER — MUPIROCIN 2 % EX OINT: 1.0000 | TOPICAL_OINTMENT | Freq: Two times a day (BID) | CUTANEOUS | 0 refills | Status: DC

## 2023-05-30 NOTE — Progress Notes (Signed)
She reports that she is having a lot of pain in the big toe on the left foot.  Has noticed some pus coming out over the last 2 to 3 days states that is split open.  She is having a lot of pain redness and swelling she states that that she has not noticed any heat coming from it.  She states that the small slip started at the base and that worked its way out.  She is tried bag balm and iodine and nothing seems to be making it better.  Objective: Vital signs stable oriented x 3 pulses are unchanged.  Hallux valgus is resulting in breakdown of the medial aspect of the tuft of the toe hallux left with reactive hyperkeratotic tissue was debrided does demonstrate an ulcerative lesion which appear to be tracking just subcutaneously around the edge of the nail and the toe.  I debrided all of this today some purulence was noted but granulation tissue and epithelialization is occurring.  Assessment: Cellulitic process with abscess and ulcerative lesion hallux left.  Plan: I started her on Augmentin and Bactroban ointment I will follow-up with her in 2 weeks she will continue to dress this daily and not get it wet.

## 2023-06-02 DIAGNOSIS — F429 Obsessive-compulsive disorder, unspecified: Secondary | ICD-10-CM | POA: Diagnosis not present

## 2023-06-02 DIAGNOSIS — N3946 Mixed incontinence: Secondary | ICD-10-CM | POA: Diagnosis not present

## 2023-06-02 DIAGNOSIS — R350 Frequency of micturition: Secondary | ICD-10-CM | POA: Diagnosis not present

## 2023-06-07 ENCOUNTER — Other Ambulatory Visit: Payer: Self-pay | Admitting: *Deleted

## 2023-06-07 ENCOUNTER — Telehealth: Payer: Self-pay

## 2023-06-07 ENCOUNTER — Telehealth: Payer: Self-pay | Admitting: Podiatry

## 2023-06-07 MED ORDER — CLINDAMYCIN HCL 150 MG PO CAPS: 150.0000 mg | ORAL_CAPSULE | Freq: Two times a day (BID) | ORAL | 1 refills | Status: DC

## 2023-06-07 NOTE — Telephone Encounter (Signed)
Patient called requesting to speak with Dr. Al Corpus or the nurse in regards to her cellulitis she stated she's called a few times and has left voicemail's in regards to this matter . She is also wanting to know if she can get a refill on her prescriptions for antibiotics.   Thanks !!

## 2023-06-07 NOTE — Telephone Encounter (Signed)
Sent in Clindamycin in addition to the Augmentin she is already taking. Continue with mupirocin and take both antibiotics. Check for fever, chills, nausea, vomiting, streaking up the leg... if so, she needs to report to the ED. She has a scheduled appt Thursday.

## 2023-06-07 NOTE — Telephone Encounter (Signed)
Patient called and left a message this morning - her cellulitis is getting worse. She needs an appointment asap - she is willing to drive to [place] today. She has an appt Thursday, but she cannot wait that long. Please call to work in today if possible - thanks

## 2023-06-09 DIAGNOSIS — L98491 Non-pressure chronic ulcer of skin of other sites limited to breakdown of skin: Secondary | ICD-10-CM | POA: Diagnosis not present

## 2023-06-09 DIAGNOSIS — F339 Major depressive disorder, recurrent, unspecified: Secondary | ICD-10-CM | POA: Diagnosis not present

## 2023-06-09 DIAGNOSIS — M1711 Unilateral primary osteoarthritis, right knee: Secondary | ICD-10-CM | POA: Diagnosis not present

## 2023-06-09 DIAGNOSIS — L97521 Non-pressure chronic ulcer of other part of left foot limited to breakdown of skin: Secondary | ICD-10-CM | POA: Diagnosis not present

## 2023-06-10 ENCOUNTER — Encounter: Payer: Self-pay | Admitting: Podiatry

## 2023-06-10 ENCOUNTER — Ambulatory Visit: Payer: Medicare PPO | Admitting: Podiatry

## 2023-06-10 DIAGNOSIS — L97521 Non-pressure chronic ulcer of other part of left foot limited to breakdown of skin: Secondary | ICD-10-CM

## 2023-06-10 NOTE — Progress Notes (Signed)
She presents today for follow-up of her ulcerative lesion medial aspect of her interphalangeal joint hallux left.  She states that she has been really good she been taking her antibiotics as we had prescribed and she feels that is doing much better.  Objective: Vital signs stable alert oriented x 3.  Debrided all reactive hyperkeratotic tissue today revealing only a 2 mm x 2 mm superficial ulcerative lesion does not probe deep at all.  I see no signs of infection.  Assessment: Well-healing cellulitis well-healing ulcerative lesion hallux left IP joint plantar medial.  Plan: Debrided the wound today and placed dressing she will continue current therapies follow-up with her in 3 weeks.

## 2023-06-24 ENCOUNTER — Telehealth: Payer: Self-pay | Admitting: Cardiovascular Disease

## 2023-06-24 DIAGNOSIS — M25561 Pain in right knee: Secondary | ICD-10-CM | POA: Diagnosis not present

## 2023-06-24 NOTE — Telephone Encounter (Signed)
 Pt states she was given Meloxicam for her pain from the orthopedics and wants to know if she should take it or not. Please advise

## 2023-06-25 NOTE — Telephone Encounter (Signed)
 Her kidney function is ok and she is not on any blood thinners and BP looks ok. She should not take it long term, but she could take it short term (a week or so) or intermittently. Do not take with naproxen (I see this on her med list)

## 2023-06-25 NOTE — Telephone Encounter (Signed)
 Her kidney function is ok and she is not on any blood thinners and BP looks ok. She should not take it long term, but she could take it short term (a week or so) or intermittently. Do not take with naproxen  (I see this on her med list)  Called and spoke with Dr Gwenette. Informed her of above instructions from the pharmacist. She voiced understanding of all instructions.

## 2023-06-28 ENCOUNTER — Other Ambulatory Visit: Payer: Self-pay | Admitting: Obstetrics and Gynecology

## 2023-06-28 DIAGNOSIS — Z1231 Encounter for screening mammogram for malignant neoplasm of breast: Secondary | ICD-10-CM

## 2023-06-29 ENCOUNTER — Encounter: Payer: Self-pay | Admitting: Podiatry

## 2023-06-29 ENCOUNTER — Ambulatory Visit: Payer: Medicare PPO | Admitting: Podiatry

## 2023-06-29 DIAGNOSIS — B351 Tinea unguium: Secondary | ICD-10-CM | POA: Diagnosis not present

## 2023-06-29 DIAGNOSIS — L84 Corns and callosities: Secondary | ICD-10-CM | POA: Diagnosis not present

## 2023-06-29 DIAGNOSIS — E11621 Type 2 diabetes mellitus with foot ulcer: Secondary | ICD-10-CM | POA: Diagnosis not present

## 2023-06-29 DIAGNOSIS — M79676 Pain in unspecified toe(s): Secondary | ICD-10-CM | POA: Diagnosis not present

## 2023-06-29 NOTE — Progress Notes (Signed)
 Subjective: Chief Complaint  Patient presents with   Foot Ulcer    RM#11 Patient here for follow up foot ulcer on left foot big toe needs clearing for surgery/nail trim ulcer looks in healing process.   83 year old female presents the office today for follow-up evaluation of ulcer, cellulitis of the toe.  She states has been doing well she has not seen any drainage or pus or any swelling or redness.  She is not reporting any fevers or chills.  Also needs her nails trimmed as are thickened elongated she cannot trim them herself.  No further lesions. Objective: AAO x3, NAD DP/PT pulses palpable bilaterally, CRT less than 3 seconds Right distal aspect the left hallux is a hyperkeratotic lesion which has some macerated tissue with this.  Upon debridement there is no definitive skin breakdown today.  There is no surrounding erythema, ascending cellulitis.  No palpation or crepitation but there is no.  No obvious signs of infection today. Nails are hypertrophic, dystrophic, brittle, discolored, elongated 10. No surrounding redness or drainage. Tenderness nails 1-5 bilaterally.  Bunion present.  No pain with calf compression, swelling, warmth, erythema  Assessment: Preulcerative callus left hallux; symptomatic onychomycosis  Plan: -All treatment options discussed with the patient including all alternatives, risks, complications.  -Separate debrided hyperkeratotic lesion of the left hallux any complications or bleeding.  The area is preulcerative but there is no signs of infection today but she needs to monitor closely for any signs or symptoms of reoccurrence of infection.  Continue with daily dressing changes, offloading. -Sharply debrided nails x 10 without any complications or bleeding. -Patient encouraged to call the office with any questions, concerns, change in symptoms.   Donnice JONELLE Fees DPM

## 2023-06-30 DIAGNOSIS — Z96642 Presence of left artificial hip joint: Secondary | ICD-10-CM | POA: Diagnosis not present

## 2023-06-30 DIAGNOSIS — M21372 Foot drop, left foot: Secondary | ICD-10-CM | POA: Diagnosis not present

## 2023-06-30 DIAGNOSIS — M1711 Unilateral primary osteoarthritis, right knee: Secondary | ICD-10-CM | POA: Diagnosis not present

## 2023-06-30 DIAGNOSIS — M1611 Unilateral primary osteoarthritis, right hip: Secondary | ICD-10-CM | POA: Diagnosis not present

## 2023-07-01 ENCOUNTER — Telehealth: Payer: Self-pay

## 2023-07-01 NOTE — Telephone Encounter (Signed)
   Pre-operative Risk Assessment    Patient Name: Kristin Mack  DOB: [birth year] MRN: [id number]   Date of last office visit: 02/09/23 Date of next office visit: 07/27/23  Request for Surgical Clearance    Procedure:  Right total hip arthroplasty   Date of Surgery:  Clearance 09/09/23                                 Surgeon:  Dr. Donnice Car Surgeon's Group or Practice Name:  [facility] Phone number:  [phone] Fax number:  [phone]   Type of Clearance Requested:   - Medical    Type of Anesthesia:  Spinal  Additional requests/questions:    SignedConnye GORMAN Hoit   07/01/2023, 1:21 PM

## 2023-07-02 NOTE — Telephone Encounter (Signed)
 I will update the requesting office the pt has in office appt 07/27/23 with Dr. Elease Hashimoto.

## 2023-07-02 NOTE — Telephone Encounter (Signed)
   Name: Kristin Mack  DOB: [birth year]  MRN: [id number]  Primary Cardiologist: Aleene Passe, MD  Chart reviewed as part of pre-operative protocol coverage. Because of Kristin Mack's past medical history and time since last visit, she will require a follow-up in-office visit in order to better assess preoperative cardiovascular risk.  Patient has an office visit scheduled on 07/27/2023 with Dr. Passe. Appointment notes have been updated to reflect need for pre-op evaluation.   Pre-op covering staff:  - Please contact requesting surgeon's office via preferred method (i.e, phone, fax) to inform them of need for appointment prior to surgery.   Barnie Hila, NP  07/02/2023, 12:03 PM

## 2023-07-13 ENCOUNTER — Telehealth: Payer: Self-pay | Admitting: Cardiovascular Disease

## 2023-07-13 NOTE — Telephone Encounter (Signed)
Patient want to know if she needs to get her calcium score test before she sees Dr. Elease Hashimoto on 07/27/23

## 2023-07-15 NOTE — Telephone Encounter (Signed)
Returned call to patient to let her know that no current order is planned for coronary calcium score CT (she had one done in October).

## 2023-07-16 ENCOUNTER — Other Ambulatory Visit: Payer: Self-pay | Admitting: Cardiovascular Disease

## 2023-07-20 ENCOUNTER — Ambulatory Visit: Payer: Medicare PPO | Admitting: Podiatry

## 2023-07-22 DIAGNOSIS — Z1211 Encounter for screening for malignant neoplasm of colon: Secondary | ICD-10-CM | POA: Diagnosis not present

## 2023-07-22 DIAGNOSIS — N951 Menopausal and female climacteric states: Secondary | ICD-10-CM | POA: Diagnosis not present

## 2023-07-22 DIAGNOSIS — L900 Lichen sclerosus et atrophicus: Secondary | ICD-10-CM | POA: Diagnosis not present

## 2023-07-22 DIAGNOSIS — Z01411 Encounter for gynecological examination (general) (routine) with abnormal findings: Secondary | ICD-10-CM | POA: Diagnosis not present

## 2023-07-22 DIAGNOSIS — Z124 Encounter for screening for malignant neoplasm of cervix: Secondary | ICD-10-CM | POA: Diagnosis not present

## 2023-07-25 ENCOUNTER — Encounter: Payer: Self-pay | Admitting: Cardiovascular Disease

## 2023-07-25 NOTE — Progress Notes (Signed)
 Cardiology Office Note   Date:  07/27/2023   ID:  Kristin Mack, Kristin Mack [birth year], MRN [id number]  PCP:  Kristin Toribio [name], MD  Cardiologist:   Kristin Passe, MD   Chief Complaint  Patient presents with   Congestive Heart Failure        Coronary Artery Disease        1. CHF- LV EF has improved dramatically 2. Hyperlipidemia 3. Left hip replacement   83 year old female with history of congestive heart failure. Her original ejection fraction was 10-20%. Her ejection fraction has dramatically improved and is now 57% ( Echo 2009). She had an echocardiogram in February 2011 that revealed normal left ventricle systolic function.   She's had 2 heart catheterizations. Her last cardiac cath was in 2006 which revealed moderate irregularities.   She's not having any episodes of chest pain or shortness breath. She works out a regular basis at least 2-3 times a week.  She has gained 20 lbs over the past several months.  She has a foot ulcer in her left foot and has not been in the pool.  She's not had any angina-like chest pain. She did have some stress-related chest discomfort around Christmastime but that has since resolved.  October 18, 2012:  [name] has done well for the past 6 months.  She has not had any dyspnea.  She has had lots of left hip pain and has not been able to exercise as much.   She is swimming 4 times a week.  Nov. 24, 2014:  [name] is doing well.  She has been having some acid reflux ( dry cough).    She has been trying eating smaller portions, earing earlier in the evening, avoiding spicy foods.   May 27,2015:  [name] is doing well.  No  CP or dyspnea.    Stressed out over family issues.   Her sons were living with her for several months.  She has gone up on her Effexor .  She has had some left thigh problems.    Dec. 1, 2015:  [name] is seen today for follow up of her CHF.    She has done well.    She has not been swimming as much recently - has an ulcer  on the bottom of her left great toe.   Oct 30, 2014:  Kristin Mack is a 83 y.o. female who presents for  Follow up of her CHF. Is getting over a cold. Has gained some weight.  Is back exercising.  Does water  exercises.  Likes to go line dancing .   No CP , breathing is ok.  Has lots of stress with her 72 yo son.   Nov. 8,  2016:  Doing well from a cardiac standpoint.  Lots of issues with GERD. Lots of stress ,    No Cp or dyspnea.   November 22, 2015:  Doing well.  Has gained 13 lbs since her last OV in Nov.  She is very frustrated with her 92 year old son who still lives in the house with her.   Breathing is good.   Swim 3 times  a week.    Dec. 6, 2017  Kristin Mack is seen today for a preoperative evaluation. She needs to have a right shoulder surgery and right carpal tunnel surgery.  She is doing well from a cardiac standpoint.   Has brief Chest pain / left shoulder pain .    Pains last for 2-3 minutes.  Not related to exercise - may occur after swimming.    Sept, 4, 2018:  Kristin Mack is seen today for follow up of her CAD Breathing is good .  Has been eating a bit of extra salt.   Going to the dentist  Myoview  study in Dec. 2017 shows a fixed apical defect thought to be breast attenuation.  Has not had any cp. Did not have the shoulder surgery   Nov 08, 2017: Today for follow-up of her coronary artery disease.  She is had some systolic congestive heart failure in the past but is overall doing great currently. No CP or dyspnea Has an ulcer on her left great toe.  Started as a callous which she clipped off with sissors .   Got infected.  She met up with an old boyfriend , walked quite a bit at the [facility]  No CP   Is not avoiding salt as much as she should .    Wt = 230 today   May 03, 2018: Kristin Mack seen today for follow-up visit. Weight today is 228 pounds. Has not been exercising in the pool  ( due to toe ulcer )  Its now healed.  Breathing is ok She was on  the incorrect dose of Lisinopril  for several months Now is on the right dose.  Has some atypical CP with arm movement -  No angina   January 02, 2019:  Kristin Mack is doing well.   Has not been swimming Has missed the exercise,   Has been eating more take out food Admits to eating more salt than she should  Nov 02, 2019:  Kristin Mack is doing well.  n  Wt today is 231 lbs.  Continues to have issues with her right knee.  No CP or dyspnea.   Is not getting as much exercise as she should .   Overall is doing well .  No cp or dyspnea   May 10, 2020:  Kristin Mack is seen today for a follow-up visit of her congestive heart failure.  Her weight today is 212 pounds. Has lost 20 lbs since last visit  Lisinopril  has been changed to Losartan  Is avoiding the [facility] pool since she had cataract surgery  Breathing is ok.   Echocardiogram from June, 2021 reveals mildly depressed left ventricular systolic function with an ejection fraction of 45 to 50%.  This EF is unchanged from her previous echo in 2014.  She has grade 1 diastolic dysfunction.  May 06, 2021: Kristin Mack is seen today for follow-up visit of her congestive heart failure.  Her weight today is 208 lbs  No CP or dyspnea. Has some fatigue  Swimming regularly, 1600 times in 20 years   March 31, 2022: Kristin Mack is seen today for follow-up visit.  She has a history of coronary artery disease and congestive heart failure.  LVEF is 45 to 50%.  Weight today was 196 pounds which is down 12 pounds from last November.  Is here for pre op evaluation prior to R carpal  tunnel surgery  No CP , no dyspnea  She is at low risk for her upcoming carpal tunnel surgery.  She may hold her aspirin  for 5 to 7 days prior to her surgery.  Restart aspirin  as soon as it is deemed safe from surgical standpoint.  Water  exercises 3-4 times a week ( an hour each time )  self directed  No cp    Nov. 20, 2023 Kristin Mack is seen to discuss her  upcoming hip surgery. Had R  carpal tunnel surgery.   Had questions about the CV risks associated with her hip surgery  No cp ,  No dyspnea   Is on Losartan only 3 days a week ( because of hyperkalemai)  We discussed Entresto  I would like for her to be over her carple tunnel surgery and over the hip surgery before we change meds that could potentially cause hypotension  Her LV function is only mildly depressed  She is at low risk for her hip replacement . She may hold her ASA for 5-7 days prior to her surgery .   Nov 09, 2022 Kristin Mack is seen for follow up of her CHF  Wt is 186 lbs .   Has steadily been losing weight  Exercising without any chest  Swims at the [facility]  3-4 times a week.  Eating fish 3 times a week  - salmon   We discussed entresto . She would like to try it  Will DC lasix  DC losartan Start Entresto  24-26 BID  BMP in 2-3 weeks   Will follow up with us  ETTER EMERSON Bane, NP) in 3 months    Feb. 4, 2025 Kristin Mack is seen for follow up of her CHF She is here for pre op evaluation prior to hip replacement Hx of chronic HFrEF, CAD, dyslipidemia, obesity   Had original EF of 10-20%.  Improved with medical therapy Cath showed moderate irregularities  We started her on Entresto  last year  Has not had any chest pain   Feeling well  Lots of arthritis  Needs to have right hip replacement  No CP  Able to exercise without   She is at low risk for her hip surgery  She may hold her ASA for 5-7 days prior to surgery        Past Medical History:  Diagnosis Date   Carpal tunnel syndrome    Left Hand   Chest pain    CHF (congestive heart failure) (HCC)    Dilated cardiomyopathy. Her original ejection fraction was 10-20%. Her ejection fraction has improved to 57% by 2009   Coronary artery disease    Depression    Dyslipidemia    Foot drop, left    GERD (gastroesophageal reflux disease)    Headache(784.0)    Hemorrhoids    History of anxiety    Hypertension    Lower back pain    Myocardial  infarction (HCC)    Obesity    Osteoarthritis    Severe with collapse of the left hip   Single functional kidney    single right kidney,  left kidney is atrophic    Past Surgical History:  Procedure Laterality Date   BREAST BIOPSY Right    x2   CARDIAC CATHETERIZATION     Ejection Fraction was around 45-50%   HYSTEROSCOPY  09/03/2011   Procedure: HYSTEROSCOPY;  Surgeon: Nathanel LELON Bunker, MD;  Location: [facility];  Service: Gynecology;  Laterality: N/A;  With Resection of polyp with truclear system   REPLACEMENT TOTAL HIP W/  RESURFACING IMPLANTS  2002   left   TUBAL LIGATION       Current Outpatient Medications  Medication Sig Dispense Refill   amoxicillin  (AMOXIL ) 500 MG capsule Take 4 capsules by mouth as needed. 1 hr prior to dental work     amoxicillin -clavulanate (AUGMENTIN ) 875-125 MG tablet Take 1 tablet by mouth 2 (two) times daily. 20 tablet 0   aspirin  81 MG tablet Take 81 mg  by mouth daily.      atorvastatin  (LIPITOR) 40 MG tablet Take 40 mg by mouth daily.  1   calcium  citrate-vitamin D  (CITRACAL+D) 315-200 MG-UNIT tablet Take by mouth.     carvedilol  (COREG ) 12.5 MG tablet TAKE 1 TABLET BY MOUTH TWICE DAILY WITH A MEAL 180 tablet 2   clindamycin  (CLEOCIN ) 150 MG capsule Take 1 capsule (150 mg total) by mouth 2 (two) times daily. 30 capsule 1   clobetasol cream (TEMOVATE) 0.05 % Apply 1 application topically 2 (two) times daily.      denosumab  (PROLIA ) 60 MG/ML SOSY injection Inject 60 mg into the skin every 6 (six) months.     estradiol  (ESTRACE ) 0.1 MG/GM vaginal cream estradiol  0.01% (0.1 mg/gram) vaginal cream apply locally 3 times per week 42.5 g 0   ezetimibe  (ZETIA ) 10 MG tablet Take 1 tablet (10 mg total) by mouth daily. 90 tablet 3   famotidine  (PEPCID ) 20 MG tablet Take 20 mg by mouth 2 (two) times daily.     latanoprost  (XALATAN ) 0.005 % ophthalmic solution Place 1 drop into both eyes at bedtime.  0   LORazepam  (ATIVAN ) 0.5 MG tablet Take 0.5 mg by mouth as  needed for anxiety.      melatonin 5 MG TABS Take 1 tablet by mouth at bedtime.     methocarbamol  (ROBAXIN ) 500 MG tablet Take by mouth.     Multiple Vitamins-Minerals (CENTRUM SILVER  ULTRA WOMENS) TABS Take 1 tablet by mouth daily.     mupirocin  ointment (BACTROBAN ) 2 % Apply 1 Application topically 2 (two) times daily. 22 g 0   naproxen  (NAPROSYN ) 500 MG tablet Take by mouth.     nitroGLYCERIN  (NITROSTAT ) 0.4 MG SL tablet PLACE 1 TABLET UNDER THE TONGUE AS NEEDED EVERY 5 MINUES FOR CHEST FOR PAIN 25 tablet 11   PRESCRIPTION MEDICATION Place 1 mL vaginally as directed. inser 1 ml vaginally 2-3 times per week as directed  Estradiol  Micronized  0.02% cream (is compounded for patient)     risperiDONE  (RISPERDAL ) 1 MG tablet Take 1 tablet by mouth daily.     sacubitril -valsartan  (ENTRESTO ) 24-26 MG Take 1 tablet by mouth 2 (two) times daily. 180 tablet 3   sodium fluoride (FLUORISHIELD) 1.1 % GEL dental gel PreviDent 5000 Booster Plus 1.1 % dental paste     trimethoprim  (TRIMPEX ) 100 MG tablet Take 100 mg by mouth daily.     venlafaxine  XR (EFFEXOR -XR) 75 MG 24 hr capsule Take 3 capsules by mouth daily.     No current facility-administered medications for this visit.    Allergies:   Celecoxib, Rofecoxib, Vigamox [moxifloxacin], and Nickel    Social History:  The patient  reports that she has never smoked. She has never used smokeless tobacco. She reports that she does not drink alcohol  and does not use drugs.   Family History:  The patient's family history includes Cancer in her sister; Stroke in her maternal grandfather and mother.    ROS:   Noted in current history, otherwise review of systems is negative.   Physical Exam: Blood pressure 110/62, pulse 73, height 5' 6 (1.676 m), weight 189 lb 12.8 oz (86.1 kg), SpO2 95%.       GEN:  Well nourished, well developed in no acute distress HEENT: Normal NECK: No JVD; No carotid bruits LYMPHATICS: No lymphadenopathy CARDIAC: RRR , no  murmurs, rubs, gallops RESPIRATORY:  Clear to auscultation without rales, wheezing or rhonchi  ABDOMEN: Soft, non-tender, non-distended MUSCULOSKELETAL:  No edema;  No deformity  SKIN: Warm and dry NEUROLOGIC:  Alert and oriented x 3    EKG:       EKG Interpretation Date/Time:  Tuesday July 27 2023 10:23:07 EST Ventricular Rate:  73 PR Interval:  160 QRS Duration:  86 QT Interval:  376 QTC Calculation: 414 R Axis:   33  Text Interpretation: Normal sinus rhythm Low voltage QRS Septal infarct , age undetermined T wave abnormality, consider inferior ischemia No significant change since last tracing from 03/31/22 Confirmed by Alveta Mungo [phone]) on 07/27/2023 10:30:29 AM     Recent Labs: 04/30/2023: ALT 14; BUN 13; Creatinine, Ser 0.78; Potassium 4.7; Sodium 139    Lipid Panel    Component Value Date/Time   CHOL 117 04/30/2023 1055   TRIG 56 04/30/2023 1055   HDL 56 04/30/2023 1055   CHOLHDL 2.1 04/30/2023 1055   CHOLHDL 2.7 05/27/2016 1044   VLDL 23 05/27/2016 1044   LDLCALC 48 04/30/2023 1055      Wt Readings from Last 3 Encounters:  07/27/23 189 lb 12.8 oz (86.1 kg)  04/01/23 187 lb (84.8 kg)  03/11/23 187 lb (84.8 kg)      Other studies Reviewed: Additional studies/ records that were reviewed today include: . Review of the above records demonstrates:    ASSESSMENT AND PLAN:  1. Chronic systolic CHF-     she is very stable .   No change in her symptoms  She is at low risk for upcoming hip surgery . She may hold her asa for 5-7 days prior to surgery    2. CAD :     no angina   3. Hyperlipidemia -      Stable     3. Carpal Tunnel syndrome:  s/p surgery            Current medicines are reviewed at length with the patient today.  The patient does not have concerns regarding medicines.  The following changes have been made:  no change  Labs/ tests ordered today include:   Orders Placed This Encounter  Procedures   EKG 12-Lead      Disposition:   FU in  1 year     Mungo Alveta, MD  07/27/2023 10:30 AM    [facility] [address] [place], [place], [place]  [address] Phone: [phone]; Fax: [phone]

## 2023-07-27 ENCOUNTER — Ambulatory Visit (INDEPENDENT_AMBULATORY_CARE_PROVIDER_SITE_OTHER): Payer: Medicare PPO

## 2023-07-27 ENCOUNTER — Ambulatory Visit: Payer: Medicare PPO | Admitting: Podiatry

## 2023-07-27 ENCOUNTER — Encounter: Payer: Self-pay | Admitting: Podiatry

## 2023-07-27 ENCOUNTER — Ambulatory Visit: Payer: Medicare PPO | Attending: Cardiovascular Disease | Admitting: Cardiovascular Disease

## 2023-07-27 VITALS — BP 110/62 | HR 73 | Ht 66.0 in | Wt 189.8 lb

## 2023-07-27 DIAGNOSIS — L97521 Non-pressure chronic ulcer of other part of left foot limited to breakdown of skin: Secondary | ICD-10-CM

## 2023-07-27 DIAGNOSIS — M778 Other enthesopathies, not elsewhere classified: Secondary | ICD-10-CM

## 2023-07-27 DIAGNOSIS — I2510 Atherosclerotic heart disease of native coronary artery without angina pectoris: Secondary | ICD-10-CM

## 2023-07-27 MED ORDER — CEPHALEXIN 500 MG PO CAPS: 500.0000 mg | ORAL_CAPSULE | Freq: Three times a day (TID) | ORAL | 0 refills | Status: DC

## 2023-07-27 NOTE — Progress Notes (Signed)
 Subjective: Chief Complaint  Patient presents with   Foot Ulcer    RM# Foot ulcer left big toe some sensitivity but no major pain.    83 year old female presents the office today for follow-up evaluation of ulcer, cellulitis of the toe.  She does not report any drainage or pus significant swelling or redness.  She has not had fevers or chills that she reports.  She has no other concerns today.     No further lesions. Objective: AAO x3, NAD DP/PT pulses palpable bilaterally, CRT less than 3 seconds Right distal medial aspect the left hallux is a hyperkeratotic lesion which has some macerated tissue with this.  Once I debrided this there is still a granular wound present as pictured below.  Prior debridement was not able to measure the wound edges, callus.  Once I debrided there is a clean wound base present but there was a very small pinpoint area of purulence on the callus.  There is no fluctuation or crepitation.  There is no malodor. Bunion present. No pain with calf compression, swelling, warmth, erythema    Assessment: Ulceration left hallux  Plan: -All treatment options discussed with the patient including all alternatives, risks, complications.  -Medically necessary wound debridement was performed today.  Sharp debrided hyperkeratotic lesion to reveal the underlying ulceration.  Debrided the wound with a #312 with scalpel down to healthy, granular tissue.  I was not able to measure the wound prior to debridement.  After debridement the wound is pictured above and measures 0.4 x 0.3 x 0.1 cm.  Again during debridement there was 1 very small superficial area of purulence.  I debrided this there is no further purulence noted.  I cleaned the area with saline.  Hemostasis achieved for manual compression but there was no significant blood loss.  Antibiotic ointment was applied followed by dressing.  Continue daily dressing changes, offloading.  I prescribed cephalexin  given the  drainage.  Return in about 1 week (around 08/03/2023) for ulcer.  Donnice JONELLE Fees DPM

## 2023-07-27 NOTE — Patient Instructions (Signed)
 Follow-Up: At [facility], you and your health needs are our priority.  As part of our continuing mission to provide you with exceptional heart care, we have created designated Provider Care Teams.  These Care Teams include your primary Cardiologist (physician) and Advanced Practice Providers (APPs -  Physician Assistants and Nurse Practitioners) who all work together to provide you with the care you need, when you need it.  We recommend signing up for the patient portal called "MyChart".  Sign up information is provided on this After Visit Summary.  MyChart is used to connect with patients for Virtual Visits (Telemedicine).  Patients are able to view lab/test results, encounter notes, upcoming appointments, etc.  Non-urgent messages can be sent to your provider as well.   To learn more about what you can do with MyChart, go to [email or web address].    Your next appointment:   1 year(s)  Provider:   Kristeen Miss, MD     Other Instructions   1st Floor: - Lobby - Registration  - Pharmacy  - Lab - Cafe  2nd Floor: - PV Lab - Diagnostic Testing (echo, CT, nuclear med)  3rd Floor: - Vacant  4th Floor: - TCTS (cardiothoracic surgery) - AFib Clinic - Structural Heart Clinic - Vascular Surgery  - Vascular Ultrasound  5th Floor: - HeartCare Cardiology (general and EP) - Clinical Pharmacy for coumadin, hypertension, lipid, weight-loss medications, and med management appointments    Valet parking services will be available as well.

## 2023-07-28 DIAGNOSIS — F429 Obsessive-compulsive disorder, unspecified: Secondary | ICD-10-CM | POA: Diagnosis not present

## 2023-08-03 ENCOUNTER — Ambulatory Visit (HOSPITAL_COMMUNITY)
Admission: RE | Admit: 2023-08-03 | Discharge: 2023-08-03 | Disposition: A | Discharge status: Home / Self Care | Payer: Medicare PPO | Source: Ambulatory Visit | Attending: Podiatry | Admitting: Podiatry

## 2023-08-03 DIAGNOSIS — L97521 Non-pressure chronic ulcer of other part of left foot limited to breakdown of skin: Secondary | ICD-10-CM | POA: Diagnosis not present

## 2023-08-03 DIAGNOSIS — H401111 Primary open-angle glaucoma, right eye, mild stage: Secondary | ICD-10-CM | POA: Diagnosis not present

## 2023-08-03 DIAGNOSIS — H35373 Puckering of macula, bilateral: Secondary | ICD-10-CM | POA: Diagnosis not present

## 2023-08-03 DIAGNOSIS — H35343 Macular cyst, hole, or pseudohole, bilateral: Secondary | ICD-10-CM | POA: Diagnosis not present

## 2023-08-03 DIAGNOSIS — H401123 Primary open-angle glaucoma, left eye, severe stage: Secondary | ICD-10-CM | POA: Diagnosis not present

## 2023-08-03 DIAGNOSIS — H59813 Chorioretinal scars after surgery for detachment, bilateral: Secondary | ICD-10-CM | POA: Diagnosis not present

## 2023-08-03 LAB — VAS US ABI WITH/WO TBI: Right ABI: 1.2

## 2023-08-04 ENCOUNTER — Other Ambulatory Visit: Payer: Self-pay | Admitting: Podiatry

## 2023-08-04 ENCOUNTER — Encounter: Payer: Self-pay | Admitting: Podiatry

## 2023-08-04 DIAGNOSIS — L97521 Non-pressure chronic ulcer of other part of left foot limited to breakdown of skin: Secondary | ICD-10-CM

## 2023-08-04 DIAGNOSIS — I739 Peripheral vascular disease, unspecified: Secondary | ICD-10-CM

## 2023-08-05 ENCOUNTER — Ambulatory Visit (INDEPENDENT_AMBULATORY_CARE_PROVIDER_SITE_OTHER): Payer: Medicare PPO | Admitting: Podiatry

## 2023-08-05 ENCOUNTER — Encounter: Payer: Self-pay | Admitting: Podiatry

## 2023-08-05 DIAGNOSIS — M79642 Pain in left hand: Secondary | ICD-10-CM | POA: Diagnosis not present

## 2023-08-05 DIAGNOSIS — L97521 Non-pressure chronic ulcer of other part of left foot limited to breakdown of skin: Secondary | ICD-10-CM | POA: Diagnosis not present

## 2023-08-05 DIAGNOSIS — G5601 Carpal tunnel syndrome, right upper limb: Secondary | ICD-10-CM | POA: Diagnosis not present

## 2023-08-05 NOTE — Progress Notes (Signed)
Subjective: Chief Complaint  Patient presents with   Foot Ulcer    RM#11 Follow up on left foot ulcer patient states doing well trying to keep it protected. No drainage no signs of infection.     83 year old female presents the office today for follow-up evaluation of ulcer, cellulitis of the toe.  She has continued to apply mupirocin ointment dressing changes daily.  She did have arterial studies performed she is scheduled to see vascular surgery on February 25.  She denies any drainage or pus or increasing swelling or redness.  Does not report any fevers or chills.  She has no other concerns today.    Objective: AAO x3, NAD DP/PT pulses palpable bilaterally, CRT less than 3 seconds Right distal medial aspect the left hallux is a hyperkeratotic lesion with 1 small pinpoint superficial opening distally which is pictured below.  There is no drainage or pus.  No probing to bone, and or tunneling.  No surrounding erythema, ascending cellulitis.  No fluctuation, crepitation, malodor. Bunion present. No pain with calf compression, swelling, warmth, erythema    Assessment: Ulceration left hallux  Plan: -All treatment options discussed with the patient including all alternatives, risks, complications.  -Scheduled to see [facility] on 08/17/2023 -Medically necessary wound debridement was performed today.  Sharp debrided hyperkeratotic lesion to reveal the underlying ulceration.  Debrided the wound with a #312 with scalpel down to healthy, granular tissue.  I was not able to measure the wound prior to debridement.  After debridement the wound is pictured above and measures 0.2 x 0.2 x 0.1 cm.  Not able to measure prior to debridement as it was covered with callus.  There is no drainage or pus noted today.  Clean area saline.  Antibiotic ointment was applied followed by dressing.  Continue daily dressing changes, offloading all times.  Return in about 2 weeks (around 08/19/2023) for ulcer toe.  Vivi Barrack DPM

## 2023-08-10 DIAGNOSIS — F429 Obsessive-compulsive disorder, unspecified: Secondary | ICD-10-CM | POA: Diagnosis not present

## 2023-08-12 ENCOUNTER — Other Ambulatory Visit: Payer: Self-pay | Admitting: Podiatry

## 2023-08-13 ENCOUNTER — Telehealth: Payer: Self-pay | Admitting: Cardiovascular Disease

## 2023-08-13 NOTE — Telephone Encounter (Signed)
 Add Comments   Add Notifications    It does appear that based on this test the blood flow has decreased to the left foot compared to what it was previously. Due to this I have put in a referral for you to see a vascular specialist at [facility]. They should be contacting you to schedule. If you do not hear from them in the next week, please let me know. In the meantime if you have any questions or concerns, please let me know.    Attached vascular US ABI results above. Called and left detailed message per DPR to inform patient that Dr Elease Hashimoto does agree with the recommendation to see [facility] and supports the physician's abilities there. Instructed to call back if questions/concerns.

## 2023-08-13 NOTE — Telephone Encounter (Signed)
 Patient states she had vascular imaging with [facility] on [facility] a few weeks ago. On the right foot circulation was the same as imaging completed in 2019, but the left showed reduced blood circulation. She would like to inform Dr. Elease Hashimoto and have him review this information.

## 2023-08-16 NOTE — Progress Notes (Unsigned)
 [facility] [place]  ASSESSMENT / PLAN: BRYNJA MARKER is a 83 y.o. female with atherosclerosis of native arteries of bilateral lower extremities.  Left great toe ulceration which is nearly healed.    Recommend:  Abstinence from all tobacco products. Blood glucose control with goal A1c < 7%. Blood pressure control with goal blood pressure < 140/90 mmHg. Lipid reduction therapy with goal LDL-C <100 mg/dL  Aspirin 81mg  PO QD.  Atorvastatin 40-80mg  PO QD (or other "high intensity" statin therapy).  Her toe pressures above the healing threshold.  I suspect this is neuropathic ulceration from her left foot drop.  I will see her again in 2 months to check progress of healing.  CHIEF COMPLAINT: Left great toe ulcer  HISTORY OF PRESENT ILLNESS: Kristin Mack is a 83 y.o. female referred to clinic for evaluation of left great toe ulcer.  The patient is a very pleasant, very intelligent woman with many great questions.  She reports waxing and waning ulceration of her left great toe which has been present for many years.  She has a chronic left foot drop which she has been dealing with for about 20 years.  Physical has been able to heal ulceration of the left great toe.  It is nearly healed today on my evaluation.  I reviewed her noninvasive testing with her in detail.  We made a plan together for careful monitoring of the wound.   Past Medical History:  Diagnosis Date   Carpal tunnel syndrome    Left Hand   Chest pain    CHF (congestive heart failure) (HCC)    Dilated cardiomyopathy. Her original ejection fraction was 10-20%. Her ejection fraction has improved to 57% by 2009   Coronary artery disease    Depression    Dyslipidemia    Foot drop, left    GERD (gastroesophageal reflux disease)    Headache(784.0)    Hemorrhoids    History of anxiety    Hypertension    Lower back pain    Myocardial infarction (HCC)    Obesity    Osteoarthritis    Severe  with collapse of the left hip   Single functional kidney    single right kidney,  left kidney is atrophic    Past Surgical History:  Procedure Laterality Date   BREAST BIOPSY Right    x2   CARDIAC CATHETERIZATION     Ejection Fraction was around 45-50%   HYSTEROSCOPY  09/03/2011   Procedure: HYSTEROSCOPY;  Surgeon: Oliver Pila, MD;  Location: [facility];  Service: Gynecology;  Laterality: N/A;  With Resection of polyp with truclear system   REPLACEMENT TOTAL HIP W/  RESURFACING IMPLANTS  2002   left   TUBAL LIGATION      Family History  Problem Relation Age of Onset   Stroke Mother    Cancer Sister    Stroke Maternal Grandfather    Breast cancer Neg Hx    Sleep apnea Neg Hx     Social History   Socioeconomic History   Marital status: Divorced    Spouse name: Not on file   Number of children: Not on file   Years of education: Not on file   Highest education level: Not on file  Occupational History   Not on file  Tobacco Use   Smoking status: Never   Smokeless tobacco: Never  Vaping Use   Vaping status: Never Used  Substance and Sexual Activity   Alcohol use: No  Alcohol/week: 0.0 standard drinks of alcohol   Drug use: No   Sexual activity: Yes    Birth control/protection: Surgical  Other Topics Concern   Not on file  Social History Narrative   Not on file   Social Drivers of Health   Financial Resource Strain: Not on file  Food Insecurity: Not on file  Transportation Needs: Not on file  Physical Activity: Not on file  Stress: Not on file  Social Connections: Not on file  Intimate Partner Violence: Not on file    Allergies  Allergen Reactions   Celecoxib Other (See Comments)   Rofecoxib     Other Reaction(s): Other (See Comments)   Vigamox [Moxifloxacin] Other (See Comments)   Nickel Itching and Rash    Other reaction(s): Not available    Current Outpatient Medications  Medication Sig Dispense Refill   amoxicillin (AMOXIL) 500 MG capsule  Take 4 capsules by mouth as needed. 1 hr prior to dental work     amoxicillin-clavulanate (AUGMENTIN) 875-125 MG tablet Take 1 tablet by mouth 2 (two) times daily. 20 tablet 0   aspirin 81 MG tablet Take 81 mg by mouth daily.      atorvastatin (LIPITOR) 40 MG tablet Take 40 mg by mouth daily.  1   calcium citrate-vitamin D (CITRACAL+D) 315-200 MG-UNIT tablet Take by mouth.     carvedilol (COREG) 12.5 MG tablet TAKE 1 TABLET BY MOUTH TWICE DAILY WITH A MEAL 180 tablet 2   clobetasol cream (TEMOVATE) 0.05 % Apply 1 application topically 2 (two) times daily.      denosumab (PROLIA) 60 MG/ML SOSY injection Inject 60 mg into the skin every 6 (six) months.     estradiol (ESTRACE) 0.1 MG/GM vaginal cream estradiol 0.01% (0.1 mg/gram) vaginal cream apply locally 3 times per week 42.5 g 0   ezetimibe (ZETIA) 10 MG tablet Take 1 tablet (10 mg total) by mouth daily. 90 tablet 3   famotidine (PEPCID) 20 MG tablet Take 20 mg by mouth 2 (two) times daily.     latanoprost (XALATAN) 0.005 % ophthalmic solution Place 1 drop into both eyes at bedtime.  0   LORazepam (ATIVAN) 0.5 MG tablet Take 0.5 mg by mouth as needed for anxiety.      melatonin 5 MG TABS Take 1 tablet by mouth at bedtime.     methocarbamol (ROBAXIN) 500 MG tablet Take by mouth.     Multiple Vitamins-Minerals (CENTRUM SILVER ULTRA WOMENS) TABS Take 1 tablet by mouth daily.     mupirocin ointment (BACTROBAN) 2 % APPLY TOPICALLY TO THE AFFECTED AREA TWICE DAILY 22 g 0   naproxen (NAPROSYN) 500 MG tablet Take by mouth.     nitroGLYCERIN (NITROSTAT) 0.4 MG SL tablet PLACE 1 TABLET UNDER THE TONGUE AS NEEDED EVERY 5 MINUES FOR CHEST FOR PAIN 25 tablet 11   PRESCRIPTION MEDICATION Place 1 mL vaginally as directed. inser 1 ml vaginally 2-3 times per week as directed  Estradiol Micronized  0.02% cream (is compounded for patient)     risperiDONE (RISPERDAL) 1 MG tablet Take 1 tablet by mouth daily.     sacubitril-valsartan (ENTRESTO) 24-26 MG Take 1  tablet by mouth 2 (two) times daily. 180 tablet 3   sodium fluoride (FLUORISHIELD) 1.1 % GEL dental gel PreviDent 5000 Booster Plus 1.1 % dental paste     trimethoprim (TRIMPEX) 100 MG tablet Take 100 mg by mouth daily.     venlafaxine XR (EFFEXOR-XR) 75 MG 24 hr capsule Take 3  capsules by mouth daily.     No current facility-administered medications for this visit.    PHYSICAL EXAM Vitals:   08/17/23 1054  BP: 104/61  Pulse: 73  Temp: 97.8 F (36.6 C)  TempSrc: Temporal  SpO2: 97%  Weight: 190 lb 3.2 oz (86.3 kg)  Height: 5\' 6"  (1.676 m)    Well-appearing woman in no acute distress Regular rate and rhythm Unlabored breathing Weakly palpable left dorsalis pedis pulse Ulcer which appears neuropathic with clean base.  Nearly healed  PERTINENT LABORATORY AND RADIOLOGIC DATA  Most recent CBC    Latest Ref Rng & Units 06/27/2016   10:56 AM 04/23/2015   11:00 AM 08/27/2011   12:07 PM  CBC  WBC 4.6 - 10.2 K/uL 10.1  5.6  8.0   Hemoglobin 12.2 - 16.2 g/dL 19.1  47.8  29.5   Hematocrit 37.7 - 47.9 % 43.7  41.1  42.4   Platelets 150 - 400 K/uL  205  211      Most recent CMP    Latest Ref Rng & Units 04/30/2023   10:55 AM 02/10/2023    8:49 AM 11/26/2022   11:34 AM  CMP  Glucose 70 - 99 mg/dL 621  308  95   BUN 8 - 27 mg/dL 13  14  16    Creatinine 0.57 - 1.00 mg/dL 6.57  8.46  9.62   Sodium 134 - 144 mmol/L 139  142  138   Potassium 3.5 - 5.2 mmol/L 4.7  4.8  5.1   Chloride 96 - 106 mmol/L 102  103  101   CO2 20 - 29 mmol/L 27  26  27    Calcium 8.7 - 10.3 mg/dL 9.1  9.3  9.8   Total Protein 6.0 - 8.5 g/dL  6.4    Total Bilirubin 0.0 - 1.2 mg/dL  0.4    Alkaline Phos 44 - 121 IU/L  69    AST 0 - 40 IU/L  21    ALT 0 - 32 IU/L 14  18      Renal function CrCl cannot be calculated (Patient's most recent lab result is older than the maximum 21 days allowed.).  Hgb A1c MFr Bld (%)  Date Value  02/23/2023 4.9    LDL Chol Calc (NIH)  Date Value Ref Range Status   04/30/2023 48 0 - 99 mg/dL Final    +-------+---------------+-----------+------------+------------+  ABI/TBIToday's ABI    Today's TBIPrevious ABIPrevious TBI  +-------+---------------+-----------+------------+------------+  Right 1.20           0.62       1.09        0.89          +-------+---------------+-----------+------------+------------+  Left  Noncompressible0.59       0.98        0.94          +-------+---------------+-----------+------------+------------+   Rande Brunt. Lenell Antu, MD [facility] Phone Number: [phone] 08/17/2023 12:15 PM   Total time spent on preparing this encounter including chart review, data review, collecting history, examining the patient, coordinating care for this new patient, 60 minutes.  Portions of this report may have been transcribed using voice recognition software.  Every effort has been made to ensure accuracy; however, inadvertent computerized transcription errors may still be present.

## 2023-08-17 ENCOUNTER — Encounter: Payer: Self-pay | Admitting: Vascular Surgery

## 2023-08-17 ENCOUNTER — Ambulatory Visit: Payer: Medicare PPO | Admitting: Vascular Surgery

## 2023-08-17 VITALS — BP 104/61 | HR 73 | Temp 97.8°F | Ht 66.0 in | Wt 190.2 lb

## 2023-08-17 DIAGNOSIS — I739 Peripheral vascular disease, unspecified: Secondary | ICD-10-CM

## 2023-08-19 ENCOUNTER — Encounter: Payer: Self-pay | Admitting: Podiatry

## 2023-08-19 ENCOUNTER — Ambulatory Visit: Payer: Medicare PPO | Admitting: Podiatry

## 2023-08-19 DIAGNOSIS — L84 Corns and callosities: Secondary | ICD-10-CM | POA: Diagnosis not present

## 2023-08-19 DIAGNOSIS — L97521 Non-pressure chronic ulcer of other part of left foot limited to breakdown of skin: Secondary | ICD-10-CM

## 2023-08-19 NOTE — Progress Notes (Unsigned)
 Subjective: Chief Complaint  Patient presents with   Foot Ulcer    RM#13 Left foot big toe ulcer follow up patient is experiencing no pain no concerns just question about vein specialist. Ulcer looks to be in good heeling state.     83 year old female presents the office today for follow-up evaluation of ulcer, cellulitis of the toe.  She follow-up with vascular surgery since I saw her last.  She has been trying to file a callus on the toe.  Do not see any drainage or pus or open lesion.  No fevers or chills.  No other concerns.   Objective: AAO x3, NAD DP/PT pulses palpable bilaterally, CRT less than 3 seconds Sensation decreased. Right distal medial aspect the left hallux is a hyperkeratotic lesion but upon debridement appears that the wound is healed but the area still preulcerative.  There is no surrounding erythema, ascending cellulitis.  There is no fluctuation or crepitation.  No odor.  There is no obvious signs of infection present. Bunion, dropfoot present. No pain with calf compression, swelling, warmth, erythema  Assessment: Ulceration left hallux  Plan: -All treatment options discussed with the patient including all alternatives, risks, complications.  -Surgery with hyperkeratotic tissue any complications or bleeding.  Appears that the wound is doing much better and is healed although it is preulcerative and she still needs to monitor closely.  Continue offloading.  Not there is no open lesions she can return to swimming however if the wound would or recur to let me know and hold off on swimming. -Daily foot inspection  Return in about 2 weeks (around 09/02/2023).  Vivi Barrack DPM

## 2023-08-20 ENCOUNTER — Telehealth: Payer: Self-pay | Admitting: Cardiovascular Disease

## 2023-08-20 NOTE — Telephone Encounter (Signed)
 Kristin Mack, RPH  [address], [place], [place] minutes ago (3:18 PM)    Yes, should be fine   Called and spoke with patient to let her know that fish oil is safe for her to take. No further questions.

## 2023-08-20 NOTE — Telephone Encounter (Signed)
 Tried to call patient. No answer.

## 2023-08-20 NOTE — Telephone Encounter (Signed)
 Pt called in asking if its safe to take Fish Oil 1000mg  3x a week. Please advise.

## 2023-08-23 ENCOUNTER — Ambulatory Visit: Payer: Medicare PPO | Admitting: Podiatry

## 2023-08-25 DIAGNOSIS — F429 Obsessive-compulsive disorder, unspecified: Secondary | ICD-10-CM | POA: Diagnosis not present

## 2023-08-26 NOTE — Patient Instructions (Addendum)
 SURGICAL WAITING ROOM VISITATION  Patients having surgery or a procedure may have no more than 2 support people in the waiting area - these visitors may rotate.    Children under the age of 55 must have an adult with them who is not the patient.  Due to an increase in RSV and influenza rates and associated hospitalizations, children ages 80 and under may not visit patients in [facility] hospitals.  Visitors with respiratory illnesses are discouraged from visiting and should remain at home.  If the patient needs to stay at the hospital during part of their recovery, the visitor guidelines for inpatient rooms apply. Pre-op nurse will coordinate an appropriate time for 1 support person to accompany patient in pre-op.  This support person may not rotate.    Please refer to the [facility] website for the visitor guidelines for Inpatients (after your surgery is over and you are in a regular room).       Your procedure is scheduled on: 09-09-23    Report to [facility] Main Entrance    Report to admitting at    0610  AM   Call this number if you have problems the morning of surgery [phone]   Do not eat food :After Midnight.   After Midnight you may have the following liquids until   0540______ AM/ DAY OF SURGERY then nothing by mouth  Water Non-Citrus Juices (without pulp, NO RED-Apple, White grape, White cranberry) Black Coffee (NO MILK/CREAM OR CREAMERS, sugar ok)  Clear Tea (NO MILK/CREAM OR CREAMERS, sugar ok) regular and decaf                             Plain Jell-O (NO RED)                                           Fruit ices (not with fruit pulp, NO RED)                                     Popsicles (NO RED)                                                               Sports drinks like Gatorade (NO RED)                    The day of surgery:  Drink ONE (1) Pre-Surgery Clear Ensure BY 0540  AM the morning of surgery. Drink in one sitting. Do not sip.  This  drink was given to you during your hospital  pre-op appointment visit. Nothing else to drink after completing the  Pre-Surgery Clear Ensure           If you have questions, please contact your surgeon's office.   FOLLOW  ANY ADDITIONAL PRE OP INSTRUCTIONS YOU RECEIVED FROM YOUR SURGEON'S OFFICE!!!     Oral Hygiene is also important to reduce your risk of infection.  Remember - BRUSH YOUR TEETH THE MORNING OF SURGERY WITH YOUR REGULAR TOOTHPASTE  DENTURES WILL BE REMOVED PRIOR TO SURGERY PLEASE DO NOT APPLY "Poly grip" OR ADHESIVES!!!   Do NOT smoke after Midnight   Stop all vitamins and herbal supplements 7 days before surgery.   Take these medicines the morning of surgery with A SIP OF WATER: Atorvastatin, Venlafaxine(effexor), Trimethoprim( trimpex), Lorazepam if needed, tylenol if needed, famotidine, ezetimibe(zetia), carvedilol,                                  You may not have any metal on your body including hair pins, jewelry, and body piercing             Do not wear make-up, lotions, powders, perfumes/cologne, or deodorant  Do not wear nail polish including gel and S&S, artificial/acrylic nails, or any other type of covering on natural nails including finger and toenails. If you have artificial nails, gel coating, etc. that needs to be removed by a nail salon please have this removed prior to surgery or surgery may need to be canceled/ delayed if the surgeon/ anesthesia feels like they are unable to be safely monitored.   Do not shave 5 days prior to surgery.             Do not bring valuables to the hospital. [facility] IS NOT             RESPONSIBLE   FOR VALUABLES.   Contacts, glasses, dentures or bridgework may not be worn into surgery.   Bring small overnight bag day of surgery.   DO NOT BRING YOUR HOME MEDICATIONS TO THE HOSPITAL. PHARMACY WILL DISPENSE MEDICATIONS LISTED ON YOUR MEDICATION LIST TO YOU DURING YOUR ADMISSION  IN THE HOSPITAL!    Patients discharged on the day of surgery will not be allowed to drive home.  Someone NEEDS to stay with you for the first 24 hours after anesthesia.   Special Instructions: Bring a copy of your healthcare power of attorney and living will documents the day of surgery if you haven't scanned them before.              Please read over the following fact sheets you were given: IF YOU HAVE QUESTIONS ABOUT YOUR PRE-OP INSTRUCTIONS PLEASE CALL [phone]    If you test positive for Covid or have been in contact with anyone that has tested positive in the last 10 days please notify you surgeon.      Pre-operative 5 CHG Bath Instructions   You can play a key role in reducing the risk of infection after surgery. Your skin needs to be as free of germs as possible. You can reduce the number of germs on your skin by washing with CHG (chlorhexidine gluconate) soap before surgery. CHG is an antiseptic soap that kills germs and continues to kill germs even after washing.   DO NOT use if you have an allergy to chlorhexidine/CHG or antibacterial soaps. If your skin becomes reddened or irritated, stop using the CHG and notify one of our RNs at [phone].   Please shower with the CHG soap starting 4 days before surgery using the following schedule:     Please keep in mind the following:  DO NOT shave, including legs and underarms, starting the day of your first shower.   You may shave your face at any point before/day of surgery.  Place  clean sheets on your bed the day you start using CHG soap. Use a clean washcloth (not used since being washed) for each shower. DO NOT sleep with pets once you start using the CHG.   CHG Shower Instructions:  If you choose to wash your hair and private area, wash first with your normal shampoo/soap.  After you use shampoo/soap, rinse your hair and body thoroughly to remove shampoo/soap residue.  Turn the water OFF and apply about 3 tablespoons  (45 ml) of CHG soap to a CLEAN washcloth.  Apply CHG soap ONLY FROM YOUR NECK DOWN TO YOUR TOES (washing for 3-5 minutes)  DO NOT use CHG soap on face, private areas, open wounds, or sores.  Pay special attention to the area where your surgery is being performed.  If you are having back surgery, having someone wash your back for you may be helpful. Wait 2 minutes after CHG soap is applied, then you may rinse off the CHG soap.  Pat dry with a clean towel  Put on clean clothes/pajamas   If you choose to wear lotion, please use ONLY the CHG-compatible lotions on the back of this paper.     Additional instructions for the day of surgery: DO NOT APPLY any lotions, deodorants, cologne, or perfumes.   Put on clean/comfortable clothes.  Brush your teeth.  Ask your nurse before applying any prescription medications to the skin.      CHG Compatible Lotions   Aveeno Moisturizing lotion  Cetaphil Moisturizing Cream  Cetaphil Moisturizing Lotion  Clairol Herbal Essence Moisturizing Lotion, Dry Skin  Clairol Herbal Essence Moisturizing Lotion, Extra Dry Skin  Clairol Herbal Essence Moisturizing Lotion, Normal Skin  Curel Age Defying Therapeutic Moisturizing Lotion with Alpha Hydroxy  Curel Extreme Care Body Lotion  Curel Soothing Hands Moisturizing Hand Lotion  Curel Therapeutic Moisturizing Cream, Fragrance-Free  Curel Therapeutic Moisturizing Lotion, Fragrance-Free  Curel Therapeutic Moisturizing Lotion, Original Formula  Eucerin Daily Replenishing Lotion  Eucerin Dry Skin Therapy Plus Alpha Hydroxy Crme  Eucerin Dry Skin Therapy Plus Alpha Hydroxy Lotion  Eucerin Original Crme  Eucerin Original Lotion  Eucerin Plus Crme Eucerin Plus Lotion  Eucerin TriLipid Replenishing Lotion  Keri Anti-Bacterial Hand Lotion  Keri Deep Conditioning Original Lotion Dry Skin Formula Softly Scented  Keri Deep Conditioning Original Lotion, Fragrance Free Sensitive Skin Formula  Keri Lotion Fast  Absorbing Fragrance Free Sensitive Skin Formula  Keri Lotion Fast Absorbing Softly Scented Dry Skin Formula  Keri Original Lotion  Keri Skin Renewal Lotion Keri Silky Smooth Lotion  Keri Silky Smooth Sensitive Skin Lotion  Nivea Body Creamy Conditioning Oil  Nivea Body Extra Enriched Teacher, adult education Moisturizing Lotion Nivea Crme  Nivea Skin Firming Lotion  NutraDerm 30 Skin Lotion  NutraDerm Skin Lotion  NutraDerm Therapeutic Skin Cream  NutraDerm Therapeutic Skin Lotion  ProShield Protective Hand Cream  WHAT IS A BLOOD TRANSFUSION? Blood Transfusion Information  A transfusion is the replacement of blood or some of its parts. Blood is made up of multiple cells which provide different functions. Red blood cells carry oxygen and are used for blood loss replacement. White blood cells fight against infection. Platelets control bleeding. Plasma helps clot blood. Other blood products are available for specialized needs, such as hemophilia or other clotting disorders. BEFORE THE TRANSFUSION  Who gives blood for transfusions?  Healthy volunteers who are fully evaluated to make sure their blood is safe. This is blood bank blood. Transfusion therapy is the  safest it has ever been in the practice of medicine. Before blood is taken from a donor, a complete history is taken to make sure that person has no history of diseases nor engages in risky social behavior (examples are intravenous drug use or sexual activity with multiple partners). The donor's travel history is screened to minimize risk of transmitting infections, such as malaria. The donated blood is tested for signs of infectious diseases, such as HIV and hepatitis. The blood is then tested to be sure it is compatible with you in order to minimize the chance of a transfusion reaction. If you or a relative donates blood, this is often done in anticipation of surgery and is not appropriate for emergency  situations. It takes many days to process the donated blood. RISKS AND COMPLICATIONS Although transfusion therapy is very safe and saves many lives, the main dangers of transfusion include:  Getting an infectious disease. Developing a transfusion reaction. This is an allergic reaction to something in the blood you were given. Every precaution is taken to prevent this. The decision to have a blood transfusion has been considered carefully by your caregiver before blood is given. Blood is not given unless the benefits outweigh the risks. AFTER THE TRANSFUSION Right after receiving a blood transfusion, you will usually feel much better and more energetic. This is especially true if your red blood cells have gotten low (anemic). The transfusion raises the level of the red blood cells which carry oxygen, and this usually causes an energy increase. The nurse administering the transfusion will monitor you carefully for complications. HOME CARE INSTRUCTIONS  No special instructions are needed after a transfusion. You may find your energy is better. Speak with your caregiver about any limitations on activity for underlying diseases you may have. SEEK MEDICAL CARE IF:  Your condition is not improving after your transfusion. You develop redness or irritation at the intravenous (IV) site. SEEK IMMEDIATE MEDICAL CARE IF:  Any of the following symptoms occur over the next 12 hours: Shaking chills. You have a temperature by mouth above 102 F (38.9 C), not controlled by medicine. Chest, back, or muscle pain. People around you feel you are not acting correctly or are confused. Shortness of breath or difficulty breathing. Dizziness and fainting. You get a rash or develop hives. You have a decrease in urine output. Your urine turns a dark color or changes to pink, red, or brown. Any of the following symptoms occur over the next 10 days: You have a temperature by mouth above 102 F (38.9 C), not controlled  by medicine. Shortness of breath. Weakness after normal activity. The white part of the eye turns yellow (jaundice). You have a decrease in the amount of urine or are urinating less often. Your urine turns a dark color or changes to pink, red, or brown. Document Released: 06/05/2000 Document Revised: 08/31/2011 Document Reviewed: 01/23/2008 Exit[employer] Patient Information 2014 [place], [place].  _______________________________________________________________________  Incentive Spirometer  An incentive spirometer is a tool that can help keep your lungs clear and active. This tool measures how well you are filling your lungs with each breath. Taking long deep breaths may help reverse or decrease the chance of developing breathing (pulmonary) problems (especially infection) following: A long period of time when you are unable to move or be active. BEFORE THE PROCEDURE  If the spirometer includes an indicator to show your best effort, your nurse or respiratory therapist will set it to a desired goal. If possible, sit up straight  or lean slightly forward. Try not to slouch. Hold the incentive spirometer in an upright position. INSTRUCTIONS FOR USE  Sit on the edge of your bed if possible, or sit up as far as you can in bed or on a chair. Hold the incentive spirometer in an upright position. Breathe out normally. Place the mouthpiece in your mouth and seal your lips tightly around it. Breathe in slowly and as deeply as possible, raising the piston or the ball toward the top of the column. Hold your breath for 3-5 seconds or for as long as possible. Allow the piston or ball to fall to the bottom of the column. Remove the mouthpiece from your mouth and breathe out normally. Rest for a few seconds and repeat Steps 1 through 7 at least 10 times every 1-2 hours when you are awake. Take your time and take a few normal breaths between deep breaths. The spirometer may include an indicator to show your best  effort. Use the indicator as a goal to work toward during each repetition. After each set of 10 deep breaths, practice coughing to be sure your lungs are clear. If you have an incision (the cut made at the time of surgery), support your incision when coughing by placing a pillow or rolled up towels firmly against it. Once you are able to get out of bed, walk around indoors and cough well. You may stop using the incentive spirometer when instructed by your caregiver.  RISKS AND COMPLICATIONS Take your time so you do not get dizzy or light-headed. If you are in pain, you may need to take or ask for pain medication before doing incentive spirometry. It is harder to take a deep breath if you are having pain. AFTER USE Rest and breathe slowly and easily. It can be helpful to keep track of a log of your progress. Your caregiver can provide you with a simple table to help with this. If you are using the spirometer at home, follow these instructions: SEEK MEDICAL CARE IF:  You are having difficultly using the spirometer. You have trouble using the spirometer as often as instructed. Your pain medication is not giving enough relief while using the spirometer. You develop fever of 100.5 F (38.1 C) or higher. SEEK IMMEDIATE MEDICAL CARE IF:  You cough up bloody sputum that had not been present before. You develop fever of 102 F (38.9 C) or greater. You develop worsening pain at or near the incision site. MAKE SURE YOU:  Understand these instructions. Will watch your condition. Will get help right away if you are not doing well or get worse. Document Released: 10/19/2006 Document Revised: 08/31/2011 Document Reviewed: 12/20/2006 [facility] Patient Information 2014 [place], [place].   ________________________________________________________________________

## 2023-08-26 NOTE — Progress Notes (Addendum)
 PCP - Maryln Gottron Cardiologist - LOV 07-27-23 Laqueta Carina, MD Clearance   PPM/ICD -  Device Orders -  Rep Notified -   Chest x-ray -  EKG - 07-27-23 epic Stress Test -  ECHO - 2021 epic  Cardiac Cath -  CT cardiac scoring- 05-09-23 epic  Sleep Study -  CPAP -   Fasting Blood Sugar -  Checks Blood Sugar _____ times a day  Blood Thinner Instructions: Aspirin Instructions:81 mg hold 5-7- days  ERAS Protcol - PRE-SURGERY Ensure     COVID vaccine -yes  Activity--Able to complete ADL's without CP or SOB Anesthesia review: CAD,CHF,MI,HTN, PAD  Patient denies shortness of breath, fever, cough and chest pain at PAT appointment   All instructions explained to the patient, with a verbal understanding of the material. Patient agrees to go over the instructions while at home for a better understanding. Patient also instructed to self quarantine after being tested for COVID-19. The opportunity to ask questions was provided.

## 2023-08-27 ENCOUNTER — Encounter (HOSPITAL_COMMUNITY): Payer: Self-pay

## 2023-08-27 ENCOUNTER — Other Ambulatory Visit: Payer: Self-pay

## 2023-08-27 ENCOUNTER — Encounter (HOSPITAL_COMMUNITY)
Admission: RE | Admit: 2023-08-27 | Discharge: 2023-08-27 | Disposition: A | Discharge status: Home / Self Care | Payer: Medicare PPO | Source: Ambulatory Visit | Attending: Orthopedic Surgery | Admitting: Orthopedic Surgery

## 2023-08-27 ENCOUNTER — Telehealth: Payer: Self-pay | Admitting: Podiatry

## 2023-08-27 VITALS — BP 107/63 | HR 67 | Temp 98.2°F | Resp 16 | Ht 66.0 in | Wt 188.0 lb

## 2023-08-27 DIAGNOSIS — Z01812 Encounter for preprocedural laboratory examination: Secondary | ICD-10-CM | POA: Insufficient documentation

## 2023-08-27 DIAGNOSIS — I110 Hypertensive heart disease with heart failure: Secondary | ICD-10-CM | POA: Insufficient documentation

## 2023-08-27 DIAGNOSIS — I509 Heart failure, unspecified: Secondary | ICD-10-CM | POA: Insufficient documentation

## 2023-08-27 DIAGNOSIS — I2510 Atherosclerotic heart disease of native coronary artery without angina pectoris: Secondary | ICD-10-CM | POA: Insufficient documentation

## 2023-08-27 DIAGNOSIS — M1611 Unilateral primary osteoarthritis, right hip: Secondary | ICD-10-CM | POA: Insufficient documentation

## 2023-08-27 DIAGNOSIS — Z01818 Encounter for other preprocedural examination: Secondary | ICD-10-CM

## 2023-08-27 DIAGNOSIS — I10 Essential (primary) hypertension: Secondary | ICD-10-CM

## 2023-08-27 HISTORY — DX: Anxiety disorder, unspecified: F41.9

## 2023-08-27 HISTORY — DX: Pneumonia, unspecified organism: J18.9

## 2023-08-27 LAB — TYPE AND SCREEN
ABO/RH(D): O POS
Antibody Screen: NEGATIVE

## 2023-08-27 LAB — SURGICAL PCR SCREEN
MRSA, PCR: NEGATIVE
Staphylococcus aureus: NEGATIVE

## 2023-08-27 LAB — CBC
HCT: 42.6 % (ref 36.0–46.0)
Hemoglobin: 14.5 g/dL (ref 12.0–15.0)
MCH: 33.4 pg (ref 26.0–34.0)
MCHC: 34 g/dL (ref 30.0–36.0)
MCV: 98.2 fL (ref 80.0–100.0)
Platelets: 219 10*3/uL (ref 150–400)
RBC: 4.34 MIL/uL (ref 3.87–5.11)
RDW: 13.2 % (ref 11.5–15.5)
WBC: 8.9 10*3/uL (ref 4.0–10.5)
nRBC: 0 % (ref 0.0–0.2)

## 2023-08-27 LAB — BASIC METABOLIC PANEL WITH GFR
Anion gap: 9 (ref 5–15)
BUN: 18 mg/dL (ref 8–23)
CO2: 26 mmol/L (ref 22–32)
Calcium: 9.1 mg/dL (ref 8.9–10.3)
Chloride: 103 mmol/L (ref 98–111)
Creatinine, Ser: 0.82 mg/dL (ref 0.44–1.00)
GFR, Estimated: >60 mL/min
Glucose, Bld: 108 mg/dL — ABNORMAL HIGH (ref 70–99)
Potassium: 4.6 mmol/L (ref 3.5–5.1)
Sodium: 138 mmol/L (ref 135–145)

## 2023-08-27 NOTE — Telephone Encounter (Signed)
 Would like to purchase some Bandages 3 1/4 long and 1 wide or something that can cover the ulcer.

## 2023-08-30 NOTE — Progress Notes (Signed)
 Anesthesia Chart Review   Case: [id number] Date/Time: 09/09/23 0825   Procedure: ARTHROPLASTY, HIP, TOTAL, ANTERIOR APPROACH (Right: Hip)   Anesthesia type: Spinal   Pre-op diagnosis: Right hip osteoarthritis   Location: [facility] ROOM 10 / [facility]   Surgeons: Kristin Romans, MD       DISCUSSION:82 y.o. never smoker with h/o HTN, CAD, CHF, right hip OA scheduled for above procedure 09/09/2023 with Dr. Durene Mack.   Pt last seen by cardiology 07/27/23. Previous EF 10-20%, improved with medical therapy.  EF 45-50% on Echo 2021.  Per Dr. Harvie Mack note, "she is very stable .   No change in her symptoms  She is at low risk for upcoming hip surgery . She may hold her asa for 5-7 days prior to surgery"   VS: BP 107/63   Pulse 67   Temp 36.8 C (Oral)   Resp 16   Ht 5\' 6"  (1.676 m)   Wt 85.3 kg   SpO2 99%   BMI 30.34 kg/m   PROVIDERS: Kristin Fillers, MD is PCP   Kristin Miss, MD is Cardiologist  LABS: Labs reviewed: Acceptable for surgery. (all labs ordered are listed, but only abnormal results are displayed)  Labs Reviewed  BASIC METABOLIC PANEL - Abnormal; Notable for the following components:      Result Value   Glucose, Bld 108 (*)    All other components within normal limits  SURGICAL PCR SCREEN  CBC  TYPE AND SCREEN     IMAGES:   EKG:   CV: Echo 12/13/2019 1. Left ventricular ejection fraction, by estimation, is 45 to 50%. The  left ventricle has mildly decreased function. Left ventricular endocardial  border not optimally defined to evaluate regional wall motion. Left  ventricular diastolic parameters are  consistent with Grade I diastolic dysfunction (impaired relaxation).   2. Right ventricular systolic function is normal. The right ventricular  size is normal. Tricuspid regurgitation signal is inadequate for assessing  PA pressure.   3. The mitral valve is normal in structure. Trivial mitral valve  regurgitation. No evidence of mitral stenosis.   4. The  aortic valve was not well visualized. Aortic valve regurgitation  is trivial. Mild aortic valve sclerosis is present, with no evidence of  aortic valve stenosis.   5. The inferior vena cava is normal in size with greater than 50%  respiratory variability, suggesting right atrial pressure of 3 mmHg.  Past Medical History:  Diagnosis Date   Anxiety    Carpal tunnel syndrome    Left Hand  nueuropath L foot   Chest pain    CHF (congestive heart failure) (HCC)    Dilated cardiomyopathy. Her original ejection fraction was 10-20%. Her ejection fraction has improved to 57% by 2009   Coronary artery disease    Depression    Dyslipidemia    Foot drop, left    from a left hip repacement   GERD (gastroesophageal reflux disease)    Headache(784.0)    Hemorrhoids    History of anxiety    Hypertension    Lower back pain    Myocardial infarction (HCC)    2002   Obesity    Osteoarthritis    Severe with collapse of the left hip   Pneumonia    Single functional kidney    single right kidney,  left kidney is atrophic    Past Surgical History:  Procedure Laterality Date   BREAST BIOPSY Right    x2   CARDIAC CATHETERIZATION  Ejection Fraction was around 45-50%   EYE SURGERY     cataracts   HYSTEROSCOPY  09/03/2011   Procedure: HYSTEROSCOPY;  Surgeon: Kristin Pila, MD;  Location: [facility];  Service: Gynecology;  Laterality: N/A;  With Resection of polyp with truclear system   REPLACEMENT TOTAL HIP W/  RESURFACING IMPLANTS  2002   left   TUBAL LIGATION      MEDICATIONS:  acetaminophen (TYLENOL) 500 MG tablet   amoxicillin (AMOXIL) 500 MG capsule   Ascorbic Acid (VITAMIN C) 1000 MG tablet   aspirin 81 MG tablet   atorvastatin (LIPITOR) 40 MG tablet   Biotin 5000 MCG CAPS   Calcium Carb-Cholecalciferol (CALCIUM 600 + D PO)   carvedilol (COREG) 12.5 MG tablet   clobetasol cream (TEMOVATE) 0.05 %   denosumab (PROLIA) 60 MG/ML SOSY injection   estradiol (ESTRACE) 0.1 MG/GM  vaginal cream   ezetimibe (ZETIA) 10 MG tablet   famotidine (PEPCID) 20 MG tablet   ibuprofen (ADVIL) 200 MG tablet   latanoprost (XALATAN) 0.005 % ophthalmic solution   LORazepam (ATIVAN) 0.5 MG tablet   melatonin 5 MG TABS   methocarbamol (ROBAXIN) 500 MG tablet   Multiple Vitamins-Minerals (CENTRUM SILVER ULTRA WOMENS) TABS   mupirocin ointment (BACTROBAN) 2 %   nitroGLYCERIN (NITROSTAT) 0.4 MG SL tablet   Omega-3 Fatty Acids (FISH OIL) 1200 MG CAPS   Polyethyl Glycol-Propyl Glycol (LUBRICATING EYE DROPS OP)   Probiotic Product (PROBIOTIC PO)   risperiDONE (RISPERDAL) 1 MG tablet   sacubitril-valsartan (ENTRESTO) 24-26 MG   Sodium Fluoride (CLINPRO 5000) 1.1 % PSTE   trimethoprim (TRIMPEX) 100 MG tablet   venlafaxine XR (EFFEXOR-XR) 75 MG 24 hr capsule   No current facility-administered medications for this encounter.     Kristin Cipro Ward, PA-C [facility] [phone]

## 2023-08-30 NOTE — Anesthesia Preprocedure Evaluation (Addendum)
 Anesthesia Evaluation  Patient identified by MRN, date of birth, ID band Patient awake    Reviewed: Allergy & Precautions, H&P , NPO status , Patient's Chart, lab work & pertinent test results  Airway Mallampati: II   Neck ROM: full    Dental   Pulmonary    breath sounds clear to auscultation       Cardiovascular hypertension, + Past MI and +CHF   Rhythm:regular Rate:Normal  TTE (2021): EF 45-50%  Cardiology clearance in Epic    Neuro/Psych  Headaches PSYCHIATRIC DISORDERS Anxiety Depression       GI/Hepatic ,GERD  ,,  Endo/Other  diabetes    Renal/GU      Musculoskeletal  (+) Arthritis ,    Abdominal   Peds  Hematology   Anesthesia Other Findings   Reproductive/Obstetrics                             Anesthesia Physical Anesthesia Plan  ASA: 3  Anesthesia Plan: MAC and Spinal   Post-op Pain Management:    Induction: Intravenous  PONV Risk Score and Plan: 2 and Propofol infusion and Treatment may vary due to age or medical condition  Airway Management Planned: Simple Face Mask  Additional Equipment:   Intra-op Plan:   Post-operative Plan:   Informed Consent: I have reviewed the patients History and Physical, chart, labs and discussed the procedure including the risks, benefits and alternatives for the proposed anesthesia with the patient or authorized representative who has indicated his/her understanding and acceptance.     Dental advisory given  Plan Discussed with: CRNA, Anesthesiologist and Surgeon  Anesthesia Plan Comments: (See PAT note 08/27/2023)       Anesthesia Quick Evaluation

## 2023-09-02 ENCOUNTER — Encounter: Payer: Self-pay | Admitting: Podiatry

## 2023-09-02 ENCOUNTER — Ambulatory Visit: Payer: Medicare PPO | Admitting: Podiatry

## 2023-09-02 DIAGNOSIS — L905 Scar conditions and fibrosis of skin: Secondary | ICD-10-CM | POA: Diagnosis not present

## 2023-09-02 DIAGNOSIS — M79676 Pain in unspecified toe(s): Secondary | ICD-10-CM | POA: Diagnosis not present

## 2023-09-02 DIAGNOSIS — L97521 Non-pressure chronic ulcer of other part of left foot limited to breakdown of skin: Secondary | ICD-10-CM | POA: Diagnosis not present

## 2023-09-02 DIAGNOSIS — I739 Peripheral vascular disease, unspecified: Secondary | ICD-10-CM

## 2023-09-02 DIAGNOSIS — B351 Tinea unguium: Secondary | ICD-10-CM | POA: Diagnosis not present

## 2023-09-02 DIAGNOSIS — L720 Epidermal cyst: Secondary | ICD-10-CM | POA: Diagnosis not present

## 2023-09-02 DIAGNOSIS — L821 Other seborrheic keratosis: Secondary | ICD-10-CM | POA: Diagnosis not present

## 2023-09-02 DIAGNOSIS — Z85828 Personal history of other malignant neoplasm of skin: Secondary | ICD-10-CM | POA: Diagnosis not present

## 2023-09-02 DIAGNOSIS — D225 Melanocytic nevi of trunk: Secondary | ICD-10-CM | POA: Diagnosis not present

## 2023-09-02 DIAGNOSIS — E11621 Type 2 diabetes mellitus with foot ulcer: Secondary | ICD-10-CM

## 2023-09-02 DIAGNOSIS — D692 Other nonthrombocytopenic purpura: Secondary | ICD-10-CM | POA: Diagnosis not present

## 2023-09-02 DIAGNOSIS — Z85820 Personal history of malignant melanoma of skin: Secondary | ICD-10-CM | POA: Diagnosis not present

## 2023-09-02 NOTE — Progress Notes (Signed)
 Kristin Mack presents today for follow-up of her ulcer hallux left.  She states that I think of developed another cold.  She says she also went to the vascular doctors who told her that her circulation was okay.  Objective: Vital signs are stable alert oriented x 3 there is no erythema edema cellulitis.  She does have a superficial ulceration that does demonstrate some purulence on palpation there is no odor this does not appear to be bacterial more white cell breakdown.  The wound measures approximately a millimeter in diameter I debrided all of the other benign skin tissue.  Assessment: Chronic ulceration hallux left.  Plan: Recommended that she continue to dress this on a daily basis with Betadine solution.  I would like to follow-up with her in 1 week prior to her right hip surgery.

## 2023-09-06 ENCOUNTER — Other Ambulatory Visit: Payer: Self-pay | Admitting: Cardiovascular Disease

## 2023-09-06 ENCOUNTER — Telehealth: Payer: Self-pay | Admitting: Podiatry

## 2023-09-06 NOTE — Telephone Encounter (Signed)
 Pt cxled appt for this wednesday states toe looks good so she did not need the appt and she is also having car trouble. She is scheduled for rfc in June.

## 2023-09-08 ENCOUNTER — Ambulatory Visit: Admitting: Podiatry

## 2023-09-09 ENCOUNTER — Ambulatory Visit (HOSPITAL_BASED_OUTPATIENT_CLINIC_OR_DEPARTMENT_OTHER): Payer: Self-pay | Admitting: Anesthesiology

## 2023-09-09 ENCOUNTER — Encounter (HOSPITAL_COMMUNITY): Payer: Self-pay | Admitting: Orthopedic Surgery

## 2023-09-09 ENCOUNTER — Observation Stay (HOSPITAL_COMMUNITY)
Admission: RE | Admit: 2023-09-09 | Discharge: 2023-09-10 | Disposition: A | Discharge status: Home / Self Care | Payer: Medicare PPO | Source: Ambulatory Visit | Attending: Orthopedic Surgery | Admitting: Orthopedic Surgery

## 2023-09-09 ENCOUNTER — Other Ambulatory Visit: Payer: Self-pay

## 2023-09-09 ENCOUNTER — Ambulatory Visit (HOSPITAL_COMMUNITY)

## 2023-09-09 ENCOUNTER — Encounter (HOSPITAL_COMMUNITY)
Admission: RE | Disposition: A | Discharge status: Home / Self Care | Payer: Self-pay | Source: Ambulatory Visit | Attending: Orthopedic Surgery

## 2023-09-09 ENCOUNTER — Observation Stay (HOSPITAL_COMMUNITY)

## 2023-09-09 ENCOUNTER — Ambulatory Visit (HOSPITAL_COMMUNITY): Payer: Self-pay | Admitting: Physician Assistant

## 2023-09-09 DIAGNOSIS — Z471 Aftercare following joint replacement surgery: Secondary | ICD-10-CM | POA: Diagnosis not present

## 2023-09-09 DIAGNOSIS — I2510 Atherosclerotic heart disease of native coronary artery without angina pectoris: Secondary | ICD-10-CM | POA: Insufficient documentation

## 2023-09-09 DIAGNOSIS — E119 Type 2 diabetes mellitus without complications: Secondary | ICD-10-CM | POA: Insufficient documentation

## 2023-09-09 DIAGNOSIS — Z96641 Presence of right artificial hip joint: Secondary | ICD-10-CM | POA: Diagnosis present

## 2023-09-09 DIAGNOSIS — I110 Hypertensive heart disease with heart failure: Secondary | ICD-10-CM | POA: Diagnosis not present

## 2023-09-09 DIAGNOSIS — I5022 Chronic systolic (congestive) heart failure: Secondary | ICD-10-CM | POA: Diagnosis not present

## 2023-09-09 DIAGNOSIS — I509 Heart failure, unspecified: Secondary | ICD-10-CM | POA: Diagnosis not present

## 2023-09-09 DIAGNOSIS — Z96642 Presence of left artificial hip joint: Secondary | ICD-10-CM | POA: Insufficient documentation

## 2023-09-09 DIAGNOSIS — Z96643 Presence of artificial hip joint, bilateral: Secondary | ICD-10-CM | POA: Diagnosis not present

## 2023-09-09 DIAGNOSIS — M1611 Unilateral primary osteoarthritis, right hip: Secondary | ICD-10-CM | POA: Diagnosis not present

## 2023-09-09 HISTORY — PX: TOTAL HIP ARTHROPLASTY: SHX124

## 2023-09-09 LAB — ABO/RH: ABO/RH(D): O POS

## 2023-09-09 SURGERY — ARTHROPLASTY, HIP, TOTAL, ANTERIOR APPROACH
Anesthesia: Monitor Anesthesia Care | Site: Hip | Laterality: Right

## 2023-09-09 MED ORDER — RISPERIDONE 1 MG PO TABS
1.0000 mg | ORAL_TABLET | Freq: Every day | ORAL | Status: DC
  Administered 2023-09-09: 1 mg via ORAL
  Filled 2023-09-09: qty 1

## 2023-09-09 MED ORDER — VENLAFAXINE HCL ER 150 MG PO CP24
225.0000 mg | ORAL_CAPSULE | Freq: Every day | ORAL | Status: DC
  Administered 2023-09-10: 225 mg via ORAL
  Filled 2023-09-09: qty 1

## 2023-09-09 MED ORDER — SODIUM CHLORIDE 0.9% FLUSH: 3.0000 mL | Freq: Two times a day (BID) | INTRAVENOUS | Status: DC

## 2023-09-09 MED ORDER — SODIUM CHLORIDE 0.9% FLUSH: 3.0000 mL | INTRAVENOUS | Status: DC | PRN

## 2023-09-09 MED ORDER — HYDROMORPHONE HCL 1 MG/ML IJ SOLN
0.5000 mg | INTRAMUSCULAR | Status: DC | PRN
  Administered 2023-09-09: 0.5 mg via INTRAVENOUS
  Filled 2023-09-09: qty 1

## 2023-09-09 MED ORDER — BISACODYL 10 MG RE SUPP: 10.0000 mg | Freq: Every day | RECTAL | Status: DC | PRN

## 2023-09-09 MED ORDER — PROPOFOL 500 MG/50ML IV EMUL
INTRAVENOUS | Status: DC | PRN
  Administered 2023-09-09: 50 ug/kg/min via INTRAVENOUS

## 2023-09-09 MED ORDER — METOCLOPRAMIDE HCL 5 MG/ML IJ SOLN: 5.0000 mg | Freq: Three times a day (TID) | INTRAMUSCULAR | Status: DC | PRN

## 2023-09-09 MED ORDER — OXYCODONE HCL 5 MG PO TABS
5.0000 mg | ORAL_TABLET | ORAL | Status: DC | PRN
  Administered 2023-09-09 – 2023-09-10 (×3): 10 mg via ORAL
  Filled 2023-09-09 (×4): qty 2

## 2023-09-09 MED ORDER — PHENOL 1.4 % MT LIQD: 1.0000 | OROMUCOSAL | Status: DC | PRN

## 2023-09-09 MED ORDER — ATORVASTATIN CALCIUM 40 MG PO TABS
40.0000 mg | ORAL_TABLET | Freq: Every day | ORAL | Status: DC
  Administered 2023-09-10: 40 mg via ORAL
  Filled 2023-09-09: qty 1

## 2023-09-09 MED ORDER — FAMOTIDINE 20 MG PO TABS
20.0000 mg | ORAL_TABLET | Freq: Two times a day (BID) | ORAL | Status: DC
  Administered 2023-09-09 – 2023-09-10 (×2): 20 mg via ORAL
  Filled 2023-09-09 (×2): qty 1

## 2023-09-09 MED ORDER — SENNA 8.6 MG PO TABS
2.0000 | ORAL_TABLET | Freq: Every day | ORAL | Status: DC
  Administered 2023-09-09: 17.2 mg via ORAL
  Filled 2023-09-09: qty 2

## 2023-09-09 MED ORDER — POLYETHYLENE GLYCOL 3350 17 G PO PACK
17.0000 g | PACK | Freq: Two times a day (BID) | ORAL | Status: DC
  Administered 2023-09-09 – 2023-09-10 (×2): 17 g via ORAL
  Filled 2023-09-09 (×2): qty 1

## 2023-09-09 MED ORDER — PROPOFOL 10 MG/ML IV BOLUS
INTRAVENOUS | Status: DC | PRN
  Administered 2023-09-09 (×2): 20 mg via INTRAVENOUS

## 2023-09-09 MED ORDER — CARVEDILOL 12.5 MG PO TABS
12.5000 mg | ORAL_TABLET | Freq: Two times a day (BID) | ORAL | Status: DC
  Administered 2023-09-09 – 2023-09-10 (×2): 12.5 mg via ORAL
  Filled 2023-09-09 (×2): qty 1

## 2023-09-09 MED ORDER — EZETIMIBE 10 MG PO TABS
10.0000 mg | ORAL_TABLET | Freq: Every day | ORAL | Status: DC
  Administered 2023-09-10: 10 mg via ORAL
  Filled 2023-09-09: qty 1

## 2023-09-09 MED ORDER — MELATONIN 5 MG PO TABS
5.0000 mg | ORAL_TABLET | Freq: Every day | ORAL | Status: DC
  Administered 2023-09-09: 5 mg via ORAL
  Filled 2023-09-09: qty 1

## 2023-09-09 MED ORDER — ORAL CARE MOUTH RINSE: 15.0000 mL | Freq: Once | OROMUCOSAL | Status: AC

## 2023-09-09 MED ORDER — SODIUM CHLORIDE (PF) 0.9 % IJ SOLN
INTRAMUSCULAR | Status: AC
  Filled 2023-09-09: qty 30

## 2023-09-09 MED ORDER — LACTATED RINGERS IV SOLN: INTRAVENOUS | Status: DC

## 2023-09-09 MED ORDER — 0.9 % SODIUM CHLORIDE (POUR BTL) OPTIME
TOPICAL | Status: DC | PRN
  Administered 2023-09-09: 1000 mL

## 2023-09-09 MED ORDER — TRANEXAMIC ACID-NACL 1000-0.7 MG/100ML-% IV SOLN
INTRAVENOUS | Status: AC
Start: 2023-09-09 — End: 2023-09-10
  Filled 2023-09-09: qty 100

## 2023-09-09 MED ORDER — ONDANSETRON HCL 4 MG/2ML IJ SOLN: 4.0000 mg | Freq: Four times a day (QID) | INTRAMUSCULAR | Status: DC | PRN

## 2023-09-09 MED ORDER — DEXAMETHASONE SODIUM PHOSPHATE 10 MG/ML IJ SOLN: 8.0000 mg | Freq: Once | INTRAMUSCULAR | Status: DC

## 2023-09-09 MED ORDER — ONDANSETRON HCL 4 MG PO TABS: 4.0000 mg | ORAL_TABLET | Freq: Four times a day (QID) | ORAL | Status: DC | PRN

## 2023-09-09 MED ORDER — EPHEDRINE SULFATE-NACL 50-0.9 MG/10ML-% IV SOSY
PREFILLED_SYRINGE | INTRAVENOUS | Status: DC | PRN
  Administered 2023-09-09 (×2): 10 mg via INTRAVENOUS

## 2023-09-09 MED ORDER — TRANEXAMIC ACID-NACL 1000-0.7 MG/100ML-% IV SOLN
1000.0000 mg | INTRAVENOUS | Status: AC
  Administered 2023-09-09: 1000 mg via INTRAVENOUS
  Filled 2023-09-09: qty 100

## 2023-09-09 MED ORDER — CEFAZOLIN SODIUM-DEXTROSE 2-4 GM/100ML-% IV SOLN
2.0000 g | Freq: Four times a day (QID) | INTRAVENOUS | Status: AC
  Administered 2023-09-09 (×2): 2 g via INTRAVENOUS
  Filled 2023-09-09 (×2): qty 100

## 2023-09-09 MED ORDER — ASPIRIN 81 MG PO CHEW
81.0000 mg | CHEWABLE_TABLET | Freq: Two times a day (BID) | ORAL | Status: DC
  Administered 2023-09-09 – 2023-09-10 (×2): 81 mg via ORAL
  Filled 2023-09-09 (×2): qty 1

## 2023-09-09 MED ORDER — BUPIVACAINE-EPINEPHRINE (PF) 0.25% -1:200000 IJ SOLN
INTRAMUSCULAR | Status: AC
  Filled 2023-09-09: qty 30

## 2023-09-09 MED ORDER — ACETAMINOPHEN 500 MG PO TABS
1000.0000 mg | ORAL_TABLET | Freq: Four times a day (QID) | ORAL | Status: DC
  Administered 2023-09-09 – 2023-09-10 (×5): 1000 mg via ORAL
  Filled 2023-09-09 (×5): qty 2

## 2023-09-09 MED ORDER — DIPHENHYDRAMINE HCL 12.5 MG/5ML PO ELIX: 12.5000 mg | ORAL_SOLUTION | ORAL | Status: DC | PRN

## 2023-09-09 MED ORDER — CHLORHEXIDINE GLUCONATE 0.12 % MT SOLN
15.0000 mL | Freq: Once | OROMUCOSAL | Status: AC
  Administered 2023-09-09: 15 mL via OROMUCOSAL

## 2023-09-09 MED ORDER — PHENYLEPHRINE HCL-NACL 20-0.9 MG/250ML-% IV SOLN
INTRAVENOUS | Status: DC | PRN
  Administered 2023-09-09: 30 ug/min via INTRAVENOUS

## 2023-09-09 MED ORDER — OXYCODONE HCL 5 MG PO TABS
10.0000 mg | ORAL_TABLET | ORAL | Status: DC | PRN
  Administered 2023-09-09: 15 mg via ORAL
  Administered 2023-09-10: 10 mg via ORAL
  Administered 2023-09-10: 15 mg via ORAL
  Filled 2023-09-09 (×2): qty 3

## 2023-09-09 MED ORDER — SACUBITRIL-VALSARTAN 24-26 MG PO TABS
1.0000 | ORAL_TABLET | Freq: Two times a day (BID) | ORAL | Status: DC
  Administered 2023-09-10: 1 via ORAL
  Filled 2023-09-09 (×2): qty 1

## 2023-09-09 MED ORDER — ALUM & MAG HYDROXIDE-SIMETH 200-200-20 MG/5ML PO SUSP: 30.0000 mL | ORAL | Status: DC | PRN

## 2023-09-09 MED ORDER — POLYETHYLENE GLYCOL 3350 17 G PO PACK: 17.0000 g | PACK | Freq: Two times a day (BID) | ORAL | Status: DC

## 2023-09-09 MED ORDER — BUPIVACAINE IN DEXTROSE 0.75-8.25 % IT SOLN
INTRATHECAL | Status: DC | PRN
  Administered 2023-09-09: 1.8 mL via INTRATHECAL

## 2023-09-09 MED ORDER — DEXAMETHASONE SODIUM PHOSPHATE 10 MG/ML IJ SOLN
10.0000 mg | Freq: Once | INTRAMUSCULAR | Status: AC
  Administered 2023-09-10: 10 mg via INTRAVENOUS
  Filled 2023-09-09: qty 1

## 2023-09-09 MED ORDER — SODIUM CHLORIDE (PF) 0.9 % IJ SOLN
INTRAMUSCULAR | Status: DC | PRN
Start: 2023-09-09 — End: 2023-09-09
  Administered 2023-09-09: 61 mL

## 2023-09-09 MED ORDER — METHOCARBAMOL 1000 MG/10ML IJ SOLN: 500.0000 mg | Freq: Four times a day (QID) | INTRAMUSCULAR | Status: DC | PRN

## 2023-09-09 MED ORDER — MENTHOL 3 MG MT LOZG: 1.0000 | LOZENGE | OROMUCOSAL | Status: DC | PRN

## 2023-09-09 MED ORDER — TRANEXAMIC ACID-NACL 1000-0.7 MG/100ML-% IV SOLN
1000.0000 mg | Freq: Once | INTRAVENOUS | Status: AC
  Administered 2023-09-09: 1000 mg via INTRAVENOUS

## 2023-09-09 MED ORDER — CEFAZOLIN SODIUM-DEXTROSE 2-4 GM/100ML-% IV SOLN
2.0000 g | INTRAVENOUS | Status: AC
  Administered 2023-09-09: 2 g via INTRAVENOUS

## 2023-09-09 MED ORDER — KETOROLAC TROMETHAMINE 30 MG/ML IJ SOLN
INTRAMUSCULAR | Status: AC
  Filled 2023-09-09: qty 1

## 2023-09-09 MED ORDER — POVIDONE-IODINE 10 % EX SWAB: 2.0000 | Freq: Once | CUTANEOUS | Status: DC

## 2023-09-09 MED ORDER — STERILE WATER FOR IRRIGATION IR SOLN
Status: DC | PRN
  Administered 2023-09-09: 1000 mL

## 2023-09-09 MED ORDER — ONDANSETRON HCL 4 MG/2ML IJ SOLN
INTRAMUSCULAR | Status: DC | PRN
  Administered 2023-09-09: 4 mg via INTRAVENOUS

## 2023-09-09 MED ORDER — METOCLOPRAMIDE HCL 5 MG PO TABS: 5.0000 mg | ORAL_TABLET | Freq: Three times a day (TID) | ORAL | Status: DC | PRN

## 2023-09-09 MED ORDER — DEXAMETHASONE SODIUM PHOSPHATE 10 MG/ML IJ SOLN
INTRAMUSCULAR | Status: DC | PRN
  Administered 2023-09-09: 10 mg via INTRAVENOUS

## 2023-09-09 MED ORDER — LATANOPROST 0.005 % OP SOLN
1.0000 [drp] | Freq: Every day | OPHTHALMIC | Status: DC
  Filled 2023-09-09: qty 2.5

## 2023-09-09 MED ORDER — LORAZEPAM 0.5 MG PO TABS: 0.5000 mg | ORAL_TABLET | Freq: Two times a day (BID) | ORAL | Status: DC | PRN

## 2023-09-09 MED ORDER — METHOCARBAMOL 500 MG PO TABS
500.0000 mg | ORAL_TABLET | Freq: Four times a day (QID) | ORAL | Status: DC | PRN
  Administered 2023-09-09 – 2023-09-10 (×2): 500 mg via ORAL
  Filled 2023-09-09 (×2): qty 1

## 2023-09-09 SURGICAL SUPPLY — 41 items
BAG COUNTER SPONGE SURGICOUNT (BAG) IMPLANT
BAG ZIPLOCK 12X15 (MISCELLANEOUS) IMPLANT
BALL HIP ARTICU EZE 36 8.5 (Hips) IMPLANT
BLADE SAG 18X100X1.27 (BLADE) ×1 IMPLANT
COVER PERINEAL POST (MISCELLANEOUS) ×1 IMPLANT
COVER SURGICAL LIGHT HANDLE (MISCELLANEOUS) ×1 IMPLANT
CUP ACET PINNACLE SECTR 56MM (Hips) IMPLANT
DERMABOND ADVANCED .7 DNX12 (GAUZE/BANDAGES/DRESSINGS) ×1 IMPLANT
DRAPE FOOT SWITCH (DRAPES) ×1 IMPLANT
DRAPE STERI IOBAN 125X83 (DRAPES) ×1 IMPLANT
DRAPE U-SHAPE 47X51 STRL (DRAPES) ×2 IMPLANT
DRESSING AQUACEL AG SP 3.5X10 (GAUZE/BANDAGES/DRESSINGS) ×1 IMPLANT
DRSG AQUACEL AG ADV 3.5X10 (GAUZE/BANDAGES/DRESSINGS) IMPLANT
DRSG AQUACEL AG SP 3.5X10 (GAUZE/BANDAGES/DRESSINGS) ×1 IMPLANT
DURAPREP 26ML APPLICATOR (WOUND CARE) ×1 IMPLANT
ELECT REM PT RETURN 15FT ADLT (MISCELLANEOUS) ×1 IMPLANT
GLOVE BIO SURGEON STRL SZ 6 (GLOVE) ×1 IMPLANT
GLOVE BIOGEL PI IND STRL 6.5 (GLOVE) ×1 IMPLANT
GLOVE BIOGEL PI IND STRL 7.5 (GLOVE) ×1 IMPLANT
GLOVE ORTHO TXT STRL SZ7.5 (GLOVE) ×2 IMPLANT
GOWN STRL REUS W/ TWL LRG LVL3 (GOWN DISPOSABLE) ×2 IMPLANT
HIP BALL ARTICU EZE 36 8.5 (Hips) ×1 IMPLANT
HOLDER FOLEY CATH W/STRAP (MISCELLANEOUS) ×1 IMPLANT
KIT TURNOVER KIT A (KITS) IMPLANT
MANIFOLD NEPTUNE II (INSTRUMENTS) ×1 IMPLANT
NDL SAFETY ECLIPSE 18X1.5 (NEEDLE) IMPLANT
PACK ANTERIOR HIP CUSTOM (KITS) ×1 IMPLANT
PINNACLE ALTRX PLUS 4 N 36X56 (Hips) IMPLANT
PINNACLE SECTOR CUP 56MM (Hips) ×1 IMPLANT
SCREW 6.5MMX30MM (Screw) IMPLANT
STEM FEM ACTIS STD SZ7 (Nail) IMPLANT
SUT MNCRL AB 4-0 PS2 18 (SUTURE) ×1 IMPLANT
SUT STRATAFIX 0 PDS 27 VIOLET (SUTURE) ×1 IMPLANT
SUT VIC AB 1 CT1 36 (SUTURE) ×3 IMPLANT
SUT VIC AB 2-0 CT1 TAPERPNT 27 (SUTURE) ×2 IMPLANT
SUTURE STRATFX 0 PDS 27 VIOLET (SUTURE) ×1 IMPLANT
SYR 3ML LL SCALE MARK (SYRINGE) IMPLANT
TOWEL GREEN STERILE FF (TOWEL DISPOSABLE) ×1 IMPLANT
TRAY FOLEY MTR SLVR 14FR STAT (SET/KITS/TRAYS/PACK) IMPLANT
TUBE SUCTION HIGH CAP CLEAR NV (SUCTIONS) ×1 IMPLANT
WATER STERILE IRR 1000ML POUR (IV SOLUTION) ×1 IMPLANT

## 2023-09-09 NOTE — Transfer of Care (Signed)
 Immediate Anesthesia Transfer of Care Note  Patient: Kristin Mack  Procedure(s) Performed: ARTHROPLASTY, HIP, TOTAL, ANTERIOR APPROACH (Right: Hip)  Patient Location: PACU  Anesthesia Type:Spinal  Level of Consciousness: awake, drowsy, and patient cooperative  Airway & Oxygen Therapy: Patient Spontanous Breathing and Patient connected to face mask oxygen  Post-op Assessment: Report given to RN and Post -op Vital signs reviewed and stable  Post vital signs: stable  Last Vitals:  Vitals Value Taken Time  BP 126/73 09/09/23 1035  Temp    Pulse 58 09/09/23 1035  Resp 14 09/09/23 1035  SpO2 100 % 09/09/23 1035  Vitals shown include unfiled device data.  Last Pain:  Vitals:   09/09/23 0652  TempSrc:   PainSc: 4       Patients Stated Pain Goal: 4 (09/09/23 1027)  Complications: No notable events documented.

## 2023-09-09 NOTE — Anesthesia Procedure Notes (Signed)
 Spinal  Patient location during procedure: OR Start time: 09/09/2023 8:52 AM End time: 09/09/2023 8:57 AM Reason for block: surgical anesthesia Staffing Performed: anesthesiologist  Anesthesiologist: Achille Rich, MD Performed by: Achille Rich, MD Authorized by: Achille Rich, MD   Preanesthetic Checklist Completed: patient identified, IV checked, risks and benefits discussed, surgical consent, monitors and equipment checked, pre-op evaluation and timeout performed Spinal Block Patient position: sitting Prep: DuraPrep Patient monitoring: cardiac monitor, continuous pulse ox and blood pressure Approach: left paramedian Location: L3-4 Injection technique: single-shot Needle Needle type: Quincke  Needle gauge: 22 G Needle length: 9 cm Assessment Sensory level: T10 Events: CSF return Additional Notes Functioning IV was confirmed and monitors were applied. Sterile prep and drape, including hand hygiene and sterile gloves were used. The patient was positioned and the spine was prepped. The skin was anesthetized with lidocaine.  Free flow of clear CSF was obtained prior to injecting local anesthetic into the CSF.  The spinal needle aspirated freely following injection.  The needle was carefully withdrawn.  The patient tolerated the procedure well.

## 2023-09-09 NOTE — Plan of Care (Signed)
  Problem: Education: Goal: Knowledge of General Education information will improve Description: Including pain rating scale, medication(s)/side effects and non-pharmacologic comfort measures Outcome: Progressing   Problem: Health Behavior/Discharge Planning: Goal: Ability to manage health-related needs will improve Outcome: Progressing   Problem: Clinical Measurements: Goal: Ability to maintain clinical measurements within normal limits will improve Outcome: Progressing Goal: Will remain free from infection Outcome: Progressing Goal: Diagnostic test results will improve Outcome: Progressing Goal: Respiratory complications will improve Outcome: Progressing Goal: Cardiovascular complication will be avoided Outcome: Progressing   Problem: Activity: Goal: Risk for activity intolerance will decrease Outcome: Adequate for Discharge   Problem: Nutrition: Goal: Adequate nutrition will be maintained Outcome: Completed/Met   Problem: Coping: Goal: Level of anxiety will decrease Outcome: Progressing   Problem: Elimination: Goal: Will not experience complications related to bowel motility Outcome: Progressing Goal: Will not experience complications related to urinary retention Outcome: Progressing   Problem: Pain Managment: Goal: General experience of comfort will improve and/or be controlled Outcome: Progressing   Problem: Safety: Goal: Ability to remain free from injury will improve Outcome: Progressing   Problem: Skin Integrity: Goal: Risk for impaired skin integrity will decrease Outcome: Progressing   Problem: Education: Goal: Knowledge of General Education information will improve Description: Including pain rating scale, medication(s)/side effects and non-pharmacologic comfort measures Outcome: Progressing   Problem: Health Behavior/Discharge Planning: Goal: Ability to manage health-related needs will improve Outcome: Progressing   Problem: Clinical  Measurements: Goal: Ability to maintain clinical measurements within normal limits will improve Outcome: Progressing Goal: Will remain free from infection Outcome: Progressing   Problem: Education: Goal: Knowledge of the prescribed therapeutic regimen will improve Outcome: Progressing Goal: Understanding of discharge needs will improve Outcome: Progressing Goal: Individualized Educational Video(s) Outcome: Completed/Met   Problem: Activity: Goal: Ability to avoid complications of mobility impairment will improve Outcome: Adequate for Discharge Goal: Ability to tolerate increased activity will improve Outcome: Adequate for Discharge   Problem: Clinical Measurements: Goal: Postoperative complications will be avoided or minimized Outcome: Progressing   Problem: Pain Management: Goal: Pain level will decrease with appropriate interventions Outcome: Progressing   Problem: Skin Integrity: Goal: Will show signs of wound healing Outcome: Progressing

## 2023-09-09 NOTE — Interval H&P Note (Signed)
 History and Physical Interval Note:  09/09/2023 7:12 AM  Kristin Mack  has presented today for surgery, with the diagnosis of Right hip osteoarthritis.  The various methods of treatment have been discussed with the patient and family. After consideration of risks, benefits and other options for treatment, the patient has consented to  Procedure(s): ARTHROPLASTY, HIP, TOTAL, ANTERIOR APPROACH (Right) as a surgical intervention.  The patient's history has been reviewed, patient examined, no change in status, stable for surgery.  I have reviewed the patient's chart and labs.  Questions were answered to the patient's satisfaction.     [name]

## 2023-09-09 NOTE — Plan of Care (Signed)

## 2023-09-09 NOTE — Discharge Instructions (Signed)

## 2023-09-09 NOTE — Anesthesia Procedure Notes (Signed)
 Procedure Name: MAC Date/Time: 09/09/2023 8:42 AM  Performed by: Vanessa Poulan, CRNAPre-anesthesia Checklist: Patient identified, Emergency Drugs available, Suction available and Patient being monitored Patient Re-evaluated:Patient Re-evaluated prior to induction Oxygen Delivery Method: Simple face mask

## 2023-09-09 NOTE — Op Note (Signed)
 NAME:  Kristin Mack                ACCOUNT NO.: [id number]      MEDICAL RECORD NO.: [id number]      FACILITY:  [facility]      PHYSICIAN:  [name]  DATE OF BIRTH:  08/27/1940     DATE OF PROCEDURE:  09/09/2023                                 OPERATIVE REPORT         PREOPERATIVE DIAGNOSIS: Right  hip osteoarthritis.      POSTOPERATIVE DIAGNOSIS:  Right hip osteoarthritis.      PROCEDURE:  Right total hip replacement through an anterior approach   utilizing DePuy THR system, component size 56 mm pinnacle cup, a size 36+4 neutral   Altrex liner, a size 7 standard Actis stem with a 36+8.5 Articuleze metal head ball.      SURGEON:  Madlyn Frankel. Charlann Boxer, M.D.      ASSISTANT:  Rosalene Billings, PA-C     ANESTHESIA:  Spinal.      SPECIMENS:  None.      COMPLICATIONS:  None.      BLOOD LOSS:  550 cc     DRAINS:  None.      INDICATION OF THE PROCEDURE:  Kristin Mack is a 83 y.o. female who had   presented to office for evaluation of right hip pain.  Radiographs revealed   progressive degenerative changes with bone-on-bone   articulation of the  hip joint, including subchondral cystic changes and osteophytes.  The patient had painful limited range of   motion significantly affecting their overall quality of life and function.  The patient was failing to    respond to conservative measures including medications and/or injections and activity modification and at this point was ready   to proceed with more definitive measures.  Consent was obtained for   benefit of pain relief.  Specific risks of infection, DVT, component   failure, dislocation, neurovascular injury, and need for revision surgery were reviewed in the office.     PROCEDURE IN DETAIL:  The patient was brought to operative theater.   Once adequate anesthesia, preoperative antibiotics, 2 gm of Ancef, 1 gm of Tranexamic Acid, and 10 mg of Decadron were administered, the patient was  positioned supine on the [employer] table.  Once the patient was safely positioned with adequate padding of boney prominences we predraped out the hip, and used fluoroscopy to confirm orientation of the pelvis.      The right hip was then prepped and draped from proximal iliac crest to   mid thigh with a shower curtain technique.      Time-out was performed identifying the patient, planned procedure, and the appropriate extremity.     An incision was then made 2 cm lateral to the   anterior superior iliac spine extending over the orientation of the   tensor fascia lata muscle and sharp dissection was carried down to the   fascia of the muscle.      The fascia was then incised.  The muscle belly was identified and swept   laterally and retractor placed along the superior neck.  Following   cauterization of the circumflex vessels and removing some pericapsular   fat, a second cobra retractor was placed on the inferior neck.  A T-capsulotomy was made along the line of the   superior neck to the trochanteric fossa, then extended proximally and   distally.  Tag sutures were placed and the retractors were then placed   intracapsular.  We then identified the trochanteric fossa and   orientation of my neck cut and then made a neck osteotomy with the femur on traction.  The femoral   head was removed without difficulty or complication.  Traction was let   off and retractors were placed posterior and anterior around the   acetabulum.      The labrum and foveal tissue were debrided.  I began reaming with a 51 mm   reamer and reamed up to 56 mm reamer with good bony bed preparation and a 57 mm  cup was chosen.  The final 57 mm Pinnacle cup was then impacted under fluoroscopy to confirm the depth of penetration and orientation with respect to   Abduction and forward flexion.  A screw was placed into the ilium followed by the hole eliminator.  The final   36+4 neutral Altrex liner was impacted with good  visualized rim fit.  The cup was positioned anatomically within the acetabular portion of the pelvis.      At this point, the femur was rolled to 100 degrees.  Further capsule was   released off the inferior aspect of the femoral neck.  I then   released the superior capsule proximally.  With the leg in a neutral position the hook was placed laterally   along the femur under the vastus lateralis origin and elevated manually and then held in position using the hook attachment on the bed.  The leg was then extended and adducted with the leg rolled to 100   degrees of external rotation.  Retractors were placed along the medial calcar and posteriorly over the greater trochanter.  Once the proximal femur was fully   exposed, I used a box osteotome to set orientation.  I then began   broaching with the starting chili pepper broach and passed this by hand and then broached up to 7.  With the 7 broach in place I chose a standard neck and did several trial reductions.  The offset was appropriate, leg lengths   appeared to be equal best matched with the +8.5 head ball trial confirmed radiographically.   Given these findings, I went ahead and dislocated the hip, repositioned all   retractors and positioned the right hip in the extended and abducted position.  The final 7 standard Actis stem was   chosen and it was impacted down to the level of neck cut.  Based on this   and the trial reductions, a final 36+8.5 Articuleze metal head ball was chosen and   impacted onto a clean and dry trunnion, and the hip was reduced.  The   hip had been irrigated throughout the case again at this point.  I did   reapproximate the superior capsular leaflet to the anterior leaflet   using #1 Vicryl.  The fascia of the   tensor fascia lata muscle was then reapproximated using #1 Vicryl and #0 Stratafix sutures.  The   remaining wound was closed with 2-0 Vicryl and running 4-0 Monocryl.   The hip was cleaned, dried, and  dressed sterilely using Dermabond and   Aquacel dressing.  The patient was then brought   to recovery room in stable condition tolerating the procedure well.    Rosalene Billings, PA-C  was present for the entirety of the case involved from   preoperative positioning, perioperative retractor management, general   facilitation of the case, as well as primary wound closure as assistant.            Madlyn Frankel Charlann Boxer, M.D.        09/09/2023 8:55 AM

## 2023-09-09 NOTE — H&P (Signed)
 TOTAL HIP ADMISSION H&P  Patient is admitted for right total hip arthroplasty.  Therapy Plans: HEP Disposition: Home with son Nida Boatman (will stay with her) Planned DVT Prophylaxis: aspirin 81mg  BID DME needed: walker PCP: Dr. Jarold Motto - clearance and labs received Cardio: Dr. Elease Hashimoto - clearance received TXA: IV Allergies: celebrex - hx of MI, moxifloxacin, nickel Anesthesia Concerns: none BMI: 31.1 Last HgbA1c: Not diabetic   Other: - Leg length discrepancy -- - Foot drop on the left related to her left THA - with use of AFO - Nervous about mobility post op based on prior posterior restrictions, but we talked in depth about this - No hx of VTE or cancer - NO NSAIDS** cardiac hx - oxycodone, robaxin, tylenol - Has right knee OA as well  Subjective:  Chief Complaint: right hip pain  HPI: Kristin Mack, 83 y.o. female, has a history of pain and functional disability in the right hip(s) due to arthritis and patient has failed non-surgical conservative treatments for greater than 12 weeks to include NSAID's and/or analgesics and activity modification.  Onset of symptoms was gradual starting 2 years ago with gradually worsening course since that time.The patient noted no past surgery on the right hip(s).  Patient currently rates pain in the right hip at 8 out of 10 with activity. Patient has worsening of pain with activity and weight bearing, pain that interfers with activities of daily living, and pain with passive range of motion. Patient has evidence of joint space narrowing by imaging studies. This condition presents safety issues increasing the risk of falls.  There is no current active infection.  Patient Active Problem List   Diagnosis Date Noted   Inflammatory pain 09/02/2022   Pain in right hand 03/25/2022   Numbness of right hand 12/04/2021   Right hand weakness 12/04/2021   Bilateral hearing loss 11/04/2021   Hx of compression fracture of spine 08/20/2021   Senile  osteoporosis 08/20/2021   Osteoarthritis of knee 08/06/2021   Unsteady gait 08/06/2021   At high risk for falls 07/24/2021   Low bone mass 07/24/2021   Pain in right knee 10/12/2019   Non-pressure chronic ulcer of other part of left foot with fat layer exposed (HCC) 11/03/2017   Encounter for general adult medical examination without abnormal findings 05/11/2017   Acute bronchitis 05/05/2017   Injury 08/21/2016   Complete tear of right rotator cuff 06/18/2016   Localized, primary osteoarthritis of shoulder region, right 06/18/2016   Coronary artery disease involving native coronary artery of native heart without angina pectoris 05/27/2016   Neck pain 02/19/2016   Other slipping, tripping and stumbling without falling, initial encounter 02/19/2016   Pain in thoracic spine 02/19/2016   Posttraumatic headache 02/19/2016   Height loss 01/09/2016   History of total left hip arthroplasty 01/09/2016   Left foot drop 01/09/2016   Postmenopausal status 01/09/2016   Hypokalemia 03/30/2013   Abscess of axilla, right 08/31/2011   Cough 07/29/2011   Sinusitis 07/29/2011   Acquired hallux valgus 05/11/2011   Type 2 diabetes mellitus with foot ulcer (CODE) (HCC) 05/11/2011   Microscopic hematuria 03/18/2011   Psychophysiologic insomnia 03/18/2011   Chronic systolic CHF (congestive heart failure) (HCC) 03/11/2011   Hyperlipidemia 03/11/2011   Carpal tunnel syndrome    GERD (gastroesophageal reflux disease)    Other specified abnormal findings of blood chemistry 11/22/2009   Vitamin D deficiency 11/22/2009   Rash and other nonspecific skin eruption 11/14/2009   Atrophic vaginitis 11/12/2009   Depressive  disorder, not elsewhere classified 11/12/2009   Onychomycosis due to dermatophyte 11/12/2009   Arthropathy 11/07/2009   Congestive heart failure (HCC) 11/07/2009   Low back pain 11/07/2009   Other acquired deformity of ankle and foot(736.79) 11/07/2009   Anxiety disorder 09/20/2009    Cardiomyopathy (HCC) 09/20/2009   Essential hypertension 09/20/2009   Nocturia 09/20/2009   Abnormal weight gain 09/20/2009   Pain in left hip 09/20/2009   Past Medical History:  Diagnosis Date   Anxiety    Carpal tunnel syndrome    Left Hand  nueuropath L foot   Chest pain    CHF (congestive heart failure) (HCC)    Dilated cardiomyopathy. Her original ejection fraction was 10-20%. Her ejection fraction has improved to 57% by 2009   Coronary artery disease    Depression    Dyslipidemia    Foot drop, left    from a left hip repacement   GERD (gastroesophageal reflux disease)    Headache(784.0)    Hemorrhoids    History of anxiety    Hypertension    Lower back pain    Myocardial infarction (HCC)    2002   Obesity    Osteoarthritis    Severe with collapse of the left hip   Pneumonia    Single functional kidney    single right kidney,  left kidney is atrophic    Past Surgical History:  Procedure Laterality Date   BREAST BIOPSY Right    x2   CARDIAC CATHETERIZATION     Ejection Fraction was around 45-50%   EYE SURGERY     cataracts   HYSTEROSCOPY  09/03/2011   Procedure: HYSTEROSCOPY;  Surgeon: Oliver Pila, MD;  Location: [facility];  Service: Gynecology;  Laterality: N/A;  With Resection of polyp with truclear system   REPLACEMENT TOTAL HIP W/  RESURFACING IMPLANTS  2002   left   TUBAL LIGATION      Current Facility-Administered Medications  Medication Dose Route Frequency Provider Last Rate Last Admin   ceFAZolin (ANCEF) IVPB 2g/100 mL premix  2 g Intravenous On Call to OR Cassandria Anger, PA-C       chlorhexidine (PERIDEX) 0.12 % solution 15 mL  15 mL Mouth/Throat Once Jairo Ben, MD       Or   Oral care mouth rinse  15 mL Mouth Rinse Once Jairo Ben, MD       dexamethasone (DECADRON) injection 8 mg  8 mg Intravenous Once Cassandria Anger, PA-C       povidone-iodine 10 % swab 2 Application  2 Application Topical Once Cassandria Anger, PA-C        sodium chloride flush (NS) 0.9 % injection 3-10 mL  3-10 mL Intravenous Q12H Cassandria Anger, PA-C       sodium chloride flush (NS) 0.9 % injection 3-10 mL  3-10 mL Intravenous PRN Cassandria Anger, PA-C       tranexamic acid (CYKLOKAPRON) IVPB 1,000 mg  1,000 mg Intravenous To OR Cassandria Anger, PA-C       Allergies  Allergen Reactions   Celecoxib Other (See Comments)    May have contributed to congestive heart failure    Rofecoxib     May have contributed to congestive heart failure    Nickel Itching and Rash    Social History   Tobacco Use   Smoking status: Never   Smokeless tobacco: Never  Substance Use Topics   Alcohol use: No    Alcohol/week: 0.0  standard drinks of alcohol    Family History  Problem Relation Age of Onset   Stroke Mother    Cancer Sister    Stroke Maternal Grandfather    Breast cancer Neg Hx    Sleep apnea Neg Hx      Review of Systems  Constitutional:  Negative for chills and fever.  Respiratory:  Negative for cough and shortness of breath.   Cardiovascular:  Negative for chest pain.  Gastrointestinal:  Negative for nausea and vomiting.  Musculoskeletal:  Positive for arthralgias.     Objective:  Physical Exam Well nourished and well developed. General: Alert and oriented x3, cooperative and pleasant, no acute distress.  Musculoskeletal: Right hip exam: Painful and limited hip flexion internal rotation over 5 degrees with pelvic tilting, external rotation to 20 degrees Slight external rotation contracture with active hip flexion with 5 -/5 strength  Right knee exam: No palpable effusion, warmth erythema 5 degree flexion contracture with flexion to 110 degrees No lower extremity edema, erythema or calf tenderness Normal strength with active dorsiflexion at the right foot and ankle   Calves soft and nontender. Motor function intact in LE. Strength 5/5 LE bilaterally. Neuro: Distal pulses 2+. Sensation to light touch intact in  LE.  Vital signs in last 24 hours:    Labs:   Estimated body mass index is 30.34 kg/m as calculated from the following:   Height as of 08/27/23: 5\' 6"  (1.676 m).   Weight as of 08/27/23: 85.3 kg.   Imaging Review Plain radiographs demonstrate severe degenerative joint disease of the right hip(s). The bone quality appears to be adequate for age and reported activity level.      Assessment/Plan:  End stage arthritis, right hip(s)  The patient history, physical examination, clinical judgement of the provider and imaging studies are consistent with end stage degenerative joint disease of the right hip(s) and total hip arthroplasty is deemed medically necessary. The treatment options including medical management, injection therapy, arthroscopy and arthroplasty were discussed at length. The risks and benefits of total hip arthroplasty were presented and reviewed. The risks due to aseptic loosening, infection, stiffness, dislocation/subluxation,  thromboembolic complications and other imponderables were discussed.  The patient acknowledged the explanation, agreed to proceed with the plan and consent was signed. Patient is being admitted for inpatient treatment for surgery, pain control, PT, OT, prophylactic antibiotics, VTE prophylaxis, progressive ambulation and ADL's and discharge planning.The patient is planning to be discharged  home.   Rosalene Billings, PA-C Orthopedic Surgery [facility] [place] [phone]

## 2023-09-09 NOTE — Care Plan (Signed)
 Ortho Bundle Case Management Note  Patient Details  Name: Kristin Mack MRN: [id number] Date of Birth: [birth year]  RT THA 09/09/23  DCP: Home with son DME: RW, ordered through [employer] PT: HEP                  DME Arranged:  Walker rolling DME Agency:  [employer]  HH Arranged:  NA HH Agency:     Additional Comments: Please contact me with any questions of if this plan should need to change.  Despina Pole, CCM [facility] [phone]   09/09/2023, 11:15 AM

## 2023-09-09 NOTE — Evaluation (Signed)
 Physical Therapy Evaluation Patient Details Name: Kristin Mack MRN: [id number] DOB: January 31, 1941 Today's Date: 09/09/2023  History of Present Illness  Pt s/p R THR and with hx of CHF, CAD, MI, cardiomyopathy, L THR (20+ years ago with residual drop foot noted)  Clinical Impression  Pt s/p R THR and presents with decreased R LE strength/ROM and post op pain limiting functional mobility.  Pt should progress to dc home with family assist.        If plan is discharge home, recommend the following: A little help with walking and/or transfers;A little help with bathing/dressing/bathroom;Assistance with cooking/housework;Assist for transportation;Help with stairs or ramp for entrance   Can travel by private vehicle        Equipment Recommendations Rolling walker (2 wheels)  Recommendations for Other Services       Functional Status Assessment Patient has had a recent decline in their functional status and demonstrates the ability to make significant improvements in function in a reasonable and predictable amount of time.     Precautions / Restrictions Precautions Precautions: Fall Restrictions Weight Bearing Restrictions Per Provider Order: No RLE Weight Bearing Per Provider Order: Weight bearing as tolerated      Mobility  Bed Mobility Overal bed mobility: Needs Assistance Bed Mobility: Supine to Sit     Supine to sit: Min assist     General bed mobility comments: cues for sequence and use of L LE to self assist    Transfers Overall transfer level: Needs assistance Equipment used: Rolling walker (2 wheels) Transfers: Sit to/from Stand Sit to Stand: Min assist           General transfer comment: cues for LE management and use off UEs to self-assist    Ambulation/Gait Ambulation/Gait assistance: Min assist Gait Distance (Feet): 48 Feet Assistive device: Rolling walker (2 wheels) Gait Pattern/deviations: Step-to pattern, Decreased step length - right,  Decreased step length - left, Shuffle, Trunk flexed Gait velocity: decr     General Gait Details: cues for posture, position from RW and sequence  Stairs            Wheelchair Mobility     Tilt Bed    Modified Rankin (Stroke Patients Only)       Balance                                             Pertinent Vitals/Pain Pain Assessment Pain Assessment: 0-10 Pain Score: 4  Pain Location: R hip Pain Descriptors / Indicators: Aching, Sore Pain Intervention(s): Limited activity within patient's tolerance, Monitored during session, Premedicated before session, Ice applied    Home Living Family/patient expects to be discharged to:: Private residence Living Arrangements: Children Available Help at Discharge: Family;Available 24 hours/day Type of Home: House Home Access: Ramped entrance       Home Layout: One level Home Equipment: Cane - single point      Prior Function Prior Level of Function : Independent/Modified Independent                     Extremity/Trunk Assessment   Upper Extremity Assessment Upper Extremity Assessment: Overall WFL for tasks assessed    Lower Extremity Assessment Lower Extremity Assessment: RLE deficits/detail    Cervical / Trunk Assessment Cervical / Trunk Assessment: Normal  Communication   Communication Communication: No apparent difficulties    Cognition Arousal:  Alert Behavior During Therapy: WFL for tasks assessed/performed   PT - Cognitive impairments: No apparent impairments                         Following commands: Intact       Cueing Cueing Techniques: Verbal cues     General Comments      Exercises Total Joint Exercises Ankle Circles/Pumps: AROM, Both, 15 reps, Supine   Assessment/Plan    PT Assessment Patient needs continued PT services  PT Problem List Decreased strength;Decreased range of motion;Decreased activity tolerance;Decreased balance;Decreased  mobility;Decreased knowledge of use of DME;Pain       PT Treatment Interventions DME instruction;Gait training;Functional mobility training;Therapeutic activities;Therapeutic exercise;Patient/family education    PT Goals (Current goals can be found in the Care Plan section)  Acute Rehab PT Goals Patient Stated Goal: Regain IND PT Goal Formulation: With patient Time For Goal Achievement: 09/16/23 Potential to Achieve Goals: Good    Frequency 7X/week     Co-evaluation               AM-PAC PT "6 Clicks" Mobility  Outcome Measure Help needed turning from your back to your side while in a flat bed without using bedrails?: None Help needed moving from lying on your back to sitting on the side of a flat bed without using bedrails?: A Little Help needed moving to and from a bed to a chair (including a wheelchair)?: A Little Help needed standing up from a chair using your arms (e.g., wheelchair or bedside chair)?: A Little Help needed to walk in hospital room?: A Little Help needed climbing 3-5 steps with a railing? : A Lot 6 Click Score: 18    End of Session Equipment Utilized During Treatment: Gait belt Activity Tolerance: Patient tolerated treatment well Patient left: in chair;with call bell/phone within reach;with chair alarm set Nurse Communication: Mobility status PT Visit Diagnosis: Unsteadiness on feet (R26.81);Difficulty in walking, not elsewhere classified (R26.2)    Time: 1610-9604 PT Time Calculation (min) (ACUTE ONLY): 29 min   Charges:   PT Evaluation $PT Eval Low Complexity: 1 Low PT Treatments $Gait Training: 8-22 mins PT General Charges $$ ACUTE PT VISIT: 1 Visit         Mauro Kaufmann PT Acute Rehabilitation Services Pager [phone] Office [phone]   [facility] 09/09/2023, 5:28 PM

## 2023-09-10 ENCOUNTER — Encounter (HOSPITAL_COMMUNITY): Payer: Self-pay | Admitting: Orthopedic Surgery

## 2023-09-10 DIAGNOSIS — Z96642 Presence of left artificial hip joint: Secondary | ICD-10-CM | POA: Diagnosis not present

## 2023-09-10 DIAGNOSIS — E119 Type 2 diabetes mellitus without complications: Secondary | ICD-10-CM | POA: Diagnosis not present

## 2023-09-10 DIAGNOSIS — I2510 Atherosclerotic heart disease of native coronary artery without angina pectoris: Secondary | ICD-10-CM | POA: Diagnosis not present

## 2023-09-10 DIAGNOSIS — I110 Hypertensive heart disease with heart failure: Secondary | ICD-10-CM | POA: Diagnosis not present

## 2023-09-10 DIAGNOSIS — I5022 Chronic systolic (congestive) heart failure: Secondary | ICD-10-CM | POA: Diagnosis not present

## 2023-09-10 DIAGNOSIS — M1611 Unilateral primary osteoarthritis, right hip: Secondary | ICD-10-CM | POA: Diagnosis not present

## 2023-09-10 LAB — CBC
HCT: 33.9 % — ABNORMAL LOW (ref 36.0–46.0)
Hemoglobin: 11.8 g/dL — ABNORMAL LOW (ref 12.0–15.0)
MCH: 33.7 pg (ref 26.0–34.0)
MCHC: 34.8 g/dL (ref 30.0–36.0)
MCV: 96.9 fL (ref 80.0–100.0)
Platelets: 176 10*3/uL (ref 150–400)
RBC: 3.5 MIL/uL — ABNORMAL LOW (ref 3.87–5.11)
RDW: 13.3 % (ref 11.5–15.5)
WBC: 17.3 10*3/uL — ABNORMAL HIGH (ref 4.0–10.5)
nRBC: 0 % (ref 0.0–0.2)

## 2023-09-10 LAB — BASIC METABOLIC PANEL
Anion gap: 5 (ref 5–15)
BUN: 22 mg/dL (ref 8–23)
CO2: 26 mmol/L (ref 22–32)
Calcium: 8.4 mg/dL — ABNORMAL LOW (ref 8.9–10.3)
Chloride: 101 mmol/L (ref 98–111)
Creatinine, Ser: 1.15 mg/dL — ABNORMAL HIGH (ref 0.44–1.00)
GFR, Estimated: 48 mL/min — ABNORMAL LOW (ref >60)
Glucose, Bld: 169 mg/dL — ABNORMAL HIGH (ref 70–99)
Potassium: 5.4 mmol/L — ABNORMAL HIGH (ref 3.5–5.1)
Sodium: 132 mmol/L — ABNORMAL LOW (ref 135–145)

## 2023-09-10 LAB — POTASSIUM: Potassium: 4.7 mmol/L (ref 3.5–5.1)

## 2023-09-10 MED ORDER — METHOCARBAMOL 500 MG PO TABS: 500.0000 mg | ORAL_TABLET | Freq: Four times a day (QID) | ORAL | 2 refills | Status: DC | PRN

## 2023-09-10 MED ORDER — POLYETHYLENE GLYCOL 3350 17 G PO PACK: 17.0000 g | PACK | Freq: Two times a day (BID) | ORAL | 0 refills | Status: AC

## 2023-09-10 MED ORDER — SENNA 8.6 MG PO TABS: 2.0000 | ORAL_TABLET | Freq: Every day | ORAL | 0 refills | Status: AC

## 2023-09-10 MED ORDER — ASPIRIN 81 MG PO CHEW: 81.0000 mg | CHEWABLE_TABLET | Freq: Two times a day (BID) | ORAL | 0 refills | Status: DC

## 2023-09-10 MED ORDER — OXYCODONE HCL 5 MG PO TABS
5.0000 mg | ORAL_TABLET | ORAL | 0 refills | Status: DC | PRN
Start: 2023-09-10 — End: 2024-02-02

## 2023-09-10 NOTE — Progress Notes (Signed)
 Physical Therapy Treatment Patient Details Name: Kristin Mack MRN: [id number] DOB: 03-29-1941 Today's Date: 09/10/2023   History of Present Illness Pt s/p R THR and with hx of CHF, CAD, MI, cardiomyopathy, L THR (20+ years ago with residual drop foot noted)    PT Comments  Pt requiring increased time for all tasks but motivated and progressing well with mobility.  Pt up to ambulate increased distance with improved stability noted.  Pt reviewed bed mobility with gait belt assist, reviewed sequence for entering shower, reviewed written HEP and reviewed car transfers.  Pt states continues apprehensive regarding return home but "I'm just going to do it".    If plan is discharge home, recommend the following: A little help with walking and/or transfers;A little help with bathing/dressing/bathroom;Assistance with cooking/housework;Assist for transportation;Help with stairs or ramp for entrance   Can travel by private vehicle        Equipment Recommendations  Rolling walker (2 wheels)    Recommendations for Other Services       Precautions / Restrictions Precautions Precautions: Fall Restrictions Weight Bearing Restrictions Per Provider Order: No RLE Weight Bearing Per Provider Order: Weight bearing as tolerated     Mobility  Bed Mobility Overal bed mobility: Needs Assistance Bed Mobility: Sit to Supine     Supine to sit: Contact guard Sit to supine: Supervision   General bed mobility comments: increased time with sequence cues and use of gait belt to self assist    Transfers Overall transfer level: Needs assistance Equipment used: Rolling walker (2 wheels) Transfers: Sit to/from Stand Sit to Stand: Contact guard assist, Supervision           General transfer comment: min cues for LE management and use off UEs to self-assist    Ambulation/Gait Ambulation/Gait assistance: Contact guard assist, Supervision Gait Distance (Feet): 111 Feet Assistive device: Rolling  walker (2 wheels) Gait Pattern/deviations: Step-to pattern, Decreased step length - right, Decreased step length - left, Shuffle, Trunk flexed Gait velocity: decr     General Gait Details: min cues for posture, position from RW and sequence   Stairs             Wheelchair Mobility     Tilt Bed    Modified Rankin (Stroke Patients Only)       Balance Overall balance assessment: Needs assistance Sitting-balance support: No upper extremity supported, Feet supported Sitting balance-Leahy Scale: Good     Standing balance support: No upper extremity supported Standing balance-Leahy Scale: Fair                              Hotel manager: No apparent difficulties  Cognition Arousal: Alert Behavior During Therapy: WFL for tasks assessed/performed   PT - Cognitive impairments: No apparent impairments                       PT - Cognition Comments: Pt processing slightly delayed vs yesterday - ? meds Following commands: Intact      Cueing Cueing Techniques: Verbal cues  Exercises Total Joint Exercises Ankle Circles/Pumps: AROM, Both, 15 reps, Supine Quad Sets: AROM, Both, 10 reps, Supine Heel Slides: AAROM, Right, 20 reps, Supine Hip ABduction/ADduction: AAROM, Right, 15 reps, Supine Long Arc Quad: AAROM, Right, 10 reps, Seated    General Comments        Pertinent Vitals/Pain Pain Assessment Pain Assessment: 0-10 Pain Score: 5  Pain Location: R hip  Pain Descriptors / Indicators: Aching, Sore Pain Intervention(s): Limited activity within patient's tolerance, Monitored during session, Premedicated before session    Home Living                          Prior Function            PT Goals (current goals can now be found in the care plan section) Acute Rehab PT Goals Patient Stated Goal: Regain IND PT Goal Formulation: With patient Time For Goal Achievement: 09/16/23 Potential to Achieve Goals:  Good Progress towards PT goals: Progressing toward goals    Frequency    7X/week      PT Plan      Co-evaluation              AM-PAC PT "6 Clicks" Mobility   Outcome Measure  Help needed turning from your back to your side while in a flat bed without using bedrails?: A Little Help needed moving from lying on your back to sitting on the side of a flat bed without using bedrails?: A Little Help needed moving to and from a bed to a chair (including a wheelchair)?: A Little Help needed standing up from a chair using your arms (e.g., wheelchair or bedside chair)?: A Little Help needed to walk in hospital room?: A Little Help needed climbing 3-5 steps with a railing? : A Little 6 Click Score: 18    End of Session Equipment Utilized During Treatment: Gait belt Activity Tolerance: Patient tolerated treatment well;Patient limited by fatigue Patient left: in bed;with call bell/phone within reach;with bed alarm set Nurse Communication: Mobility status PT Visit Diagnosis: Unsteadiness on feet (R26.81);Difficulty in walking, not elsewhere classified (R26.2)     Time: 3664-4034 PT Time Calculation (min) (ACUTE ONLY): 41 min  Charges:    $Gait Training: 23-37 mins $Therapeutic Exercise: 8-22 mins $Therapeutic Activity: 8-22 mins PT General Charges $$ ACUTE PT VISIT: 1 Visit                     Mauro Kaufmann PT Acute Rehabilitation Services Pager [phone] Office [phone]    Helvi Royals 09/10/2023, 2:57 PM

## 2023-09-10 NOTE — Progress Notes (Signed)
   Subjective: 1 Day Post-Op Procedure(s) (LRB): ARTHROPLASTY, HIP, TOTAL, ANTERIOR APPROACH (Right) Patient reports pain as mild.   Patient seen in rounds with Dr. Charlann Boxer. Patient is resting in bed on exam this morning. No acute events overnight. Foley catheter removed. Patient ambulated 48 feet with PT yesterday. We will start therapy today.   Objective: Vital signs in last 24 hours: Temp:  [97.6 F (36.4 C)-98 F (36.7 C)] 98 F (36.7 C) (03/21 0601) Pulse Rate:  [45-76] 67 (03/21 0601) Resp:  [12-19] 15 (03/21 0601) BP: (92-130)/(51-79) 110/66 (03/21 0601) SpO2:  [92 %-100 %] 94 % (03/21 0601)  Intake/Output from previous day:  Intake/Output Summary (Last 24 hours) at 09/10/2023 0747 Last data filed at 09/10/2023 0601 Gross per 24 hour  Intake 1436.03 ml  Output 2075 ml  Net -638.97 ml     Intake/Output this shift: No intake/output data recorded.  Labs: Recent Labs    09/10/23 0316  HGB 11.8*   Recent Labs    09/10/23 0316  WBC 17.3*  RBC 3.50*  HCT 33.9*  PLT 176   Recent Labs    09/10/23 0316  NA 132*  K 5.4*  CL 101  CO2 26  BUN 22  CREATININE 1.15*  GLUCOSE 169*  CALCIUM 8.4*   No results for input(s): "LABPT", "INR" in the last 72 hours.  Exam: General - Patient is Alert and Oriented Extremity - Neurologically intact Sensation intact distally Intact pulses distally Dorsiflexion/Plantar flexion intact Dressing - dressing C/D/I Motor Function - intact, moving foot and toes well on exam.   Past Medical History:  Diagnosis Date   Anxiety    Carpal tunnel syndrome    Left Hand  nueuropath L foot   Chest pain    CHF (congestive heart failure) (HCC)    Dilated cardiomyopathy. Her original ejection fraction was 10-20%. Her ejection fraction has improved to 57% by 2009   Coronary artery disease    Depression    Dyslipidemia    Foot drop, left    from a left hip repacement   GERD (gastroesophageal reflux disease)    Headache(784.0)     Hemorrhoids    History of anxiety    Hypertension    Lower back pain    Myocardial infarction (HCC)    2002   Obesity    Osteoarthritis    Severe with collapse of the left hip   Pneumonia    Single functional kidney    single right kidney,  left kidney is atrophic    Assessment/Plan: 1 Day Post-Op Procedure(s) (LRB): ARTHROPLASTY, HIP, TOTAL, ANTERIOR APPROACH (Right) Principal Problem:   S/P total right hip arthroplasty Active Problems:   Status post total replacement of right hip  Estimated body mass index is 30.34 kg/m as calculated from the following:   Height as of this encounter: 5\' 6"  (1.676 m).   Weight as of this encounter: 85.3 kg. Advance diet Up with therapy D/C IV fluids  DVT Prophylaxis - Aspirin Weight bearing as tolerated.  Potassium 5.4 this AM - was 4.6 preop - she is on entresto. Will recheck today. Hgb stable at 11.8 this AM  Plan is to go Home after hospital stay. Plan for discharge today after meeting goals with therapy. Follow up in the office in 2 weeks.   Rosalene Billings, PA-C Orthopedic Surgery [phone] 09/10/2023, 7:47 AM

## 2023-09-10 NOTE — Progress Notes (Signed)
 Physical Therapy Treatment Patient Details Name: Kristin Mack MRN: [id number] DOB: Sep 25, 1940 Today's Date: 09/10/2023   History of Present Illness Pt s/p R THR and with hx of CHF, CAD, MI, cardiomyopathy, L THR (20+ years ago with residual drop foot noted)    PT Comments  Pt very cooperative but requiring increased time and cueing for all tasks 2* noted fatigue and slight increase in time to process.  Pt up to ambulate increased distance in hall and HEP initiated with written instruction provided.  Pt expressing apprehension regarding dc home this date.   If plan is discharge home, recommend the following: A little help with walking and/or transfers;A little help with bathing/dressing/bathroom;Assistance with cooking/housework;Assist for transportation;Help with stairs or ramp for entrance   Can travel by private vehicle        Equipment Recommendations  Rolling walker (2 wheels)    Recommendations for Other Services       Precautions / Restrictions Precautions Precautions: Fall Restrictions Weight Bearing Restrictions Per Provider Order: No RLE Weight Bearing Per Provider Order: Weight bearing as tolerated     Mobility  Bed Mobility Overal bed mobility: Needs Assistance Bed Mobility: Supine to Sit     Supine to sit: Contact guard     General bed mobility comments: cues for sequence and use of L LE to self assist    Transfers Overall transfer level: Needs assistance Equipment used: Rolling walker (2 wheels) Transfers: Sit to/from Stand Sit to Stand: Min assist, Contact guard assist           General transfer comment: cues for LE management and use off UEs to self-assist    Ambulation/Gait Ambulation/Gait assistance: Min assist, Contact guard assist Gait Distance (Feet): 74 Feet Assistive device: Rolling walker (2 wheels) Gait Pattern/deviations: Step-to pattern, Decreased step length - right, Decreased step length - left, Shuffle, Trunk flexed Gait  velocity: decr     General Gait Details: cues for posture, position from RW and sequence   Stairs             Wheelchair Mobility     Tilt Bed    Modified Rankin (Stroke Patients Only)       Balance Overall balance assessment: Needs assistance Sitting-balance support: No upper extremity supported, Feet supported Sitting balance-Leahy Scale: Good     Standing balance support: Bilateral upper extremity supported Standing balance-Leahy Scale: Poor                              Communication Communication Communication: No apparent difficulties  Cognition Arousal: Alert Behavior During Therapy: WFL for tasks assessed/performed   PT - Cognitive impairments: No apparent impairments                       PT - Cognition Comments: Pt processing slightly delayed vs yesterday - ? meds Following commands: Intact      Cueing Cueing Techniques: Verbal cues  Exercises Total Joint Exercises Ankle Circles/Pumps: AROM, Both, 15 reps, Supine Quad Sets: AROM, Both, 10 reps, Supine Heel Slides: AAROM, Right, 20 reps, Supine Hip ABduction/ADduction: AAROM, Right, 15 reps, Supine Long Arc Quad: AAROM, Right, 10 reps, Seated    General Comments        Pertinent Vitals/Pain Pain Assessment Pain Assessment: 0-10 Pain Score: 6  Pain Location: R hip Pain Descriptors / Indicators: Aching, Sore Pain Intervention(s): Limited activity within patient's tolerance, Monitored during session, Premedicated before session,  Ice applied    Home Living                          Prior Function            PT Goals (current goals can now be found in the care plan section) Acute Rehab PT Goals Patient Stated Goal: Regain IND PT Goal Formulation: With patient Time For Goal Achievement: 09/16/23 Potential to Achieve Goals: Good Progress towards PT goals: Progressing toward goals    Frequency    7X/week      PT Plan      Co-evaluation               AM-PAC PT "6 Clicks" Mobility   Outcome Measure  Help needed turning from your back to your side while in a flat bed without using bedrails?: A Little Help needed moving from lying on your back to sitting on the side of a flat bed without using bedrails?: A Little Help needed moving to and from a bed to a chair (including a wheelchair)?: A Little Help needed standing up from a chair using your arms (e.g., wheelchair or bedside chair)?: A Little Help needed to walk in hospital room?: A Little Help needed climbing 3-5 steps with a railing? : A Lot 6 Click Score: 17    End of Session Equipment Utilized During Treatment: Gait belt Activity Tolerance: Patient tolerated treatment well;Patient limited by fatigue Patient left: in chair;with call bell/phone within reach;with chair alarm set Nurse Communication: Mobility status PT Visit Diagnosis: Unsteadiness on feet (R26.81);Difficulty in walking, not elsewhere classified (R26.2)     Time: 6045-4098 PT Time Calculation (min) (ACUTE ONLY): 45 min  Charges:    $Gait Training: 8-22 mins $Therapeutic Exercise: 8-22 mins $Therapeutic Activity: 8-22 mins PT General Charges $$ ACUTE PT VISIT: 1 Visit                     Mauro Kaufmann PT Acute Rehabilitation Services Pager [phone] Office [phone]    Davyd Podgorski 09/10/2023, 12:22 PM

## 2023-09-10 NOTE — TOC Initial Note (Signed)
 Transition of Care [facility]) - Initial/Assessment Note    Patient Details  Name: Kristin Mack MRN: [id number] Date of Birth: [birth year]  [facility] CM/SW Contact:    Adrian Prows, RN Phone Number: 09/10/2023, 11:20 AM  Clinical Narrative:                 Sherron Monday w/ pt in room; pt says she shares a home w/ her son; she plans to return at d/c; she identified POC son [name] [phone]); he will provide transportation; pt verified insurance/PCP; she denies SDOH risks; she has cane, and BSC; walker was delivered to room on yesterday; pt says she does not have HH services or home oxygen; no TOC needs.  Expected Discharge Plan: Home/Self Care Barriers to Discharge: No Barriers Identified   Patient Goals and CMS Choice Patient states their goals for this hospitalization and ongoing recovery are:: home CMS Medicare.gov Compare Post Acute Care list provided to:: Patient   [place] ownership interest in [facility].provided to:: Patient    Expected Discharge Plan and Services   Discharge Planning Services: CM Consult   Living arrangements for the past 2 months: Single Family Home Expected Discharge Date: 09/10/23               DME Arranged: Dan Humphreys rolling DME Agency: [employer]       HH Arranged: NA HH Agency: NA        Prior Living Arrangements/Services Living arrangements for the past 2 months: Single Family Home   Patient language and need for interpreter reviewed:: Yes Do you feel safe going back to the place where you live?: Yes      Need for Family Participation in Patient Care: Yes (Comment) Care giver support system in place?: Yes (comment) Current home services: DME (cane, BSC) Criminal Activity/Legal Involvement Pertinent to Current Situation/Hospitalization: No - Comment as needed  Activities of Daily Living   ADL Screening (condition at time of admission) Independently performs ADLs?: Yes (appropriate for  developmental age) Is the patient deaf or have difficulty hearing?: No Does the patient have difficulty seeing, even when wearing glasses/contacts?: No Does the patient have difficulty concentrating, remembering, or making decisions?: No  Permission Sought/Granted Permission sought to share information with : Case Manager Permission granted to share information with : Yes, Verbal Permission Granted  Share Information with NAME: Case Manager     Permission granted to share info w Relationship: Tanasia Budzinski (son) [phone]     Emotional Assessment Appearance:: Appears stated age Attitude/Demeanor/Rapport: Gracious Affect (typically observed): Accepting Orientation: : Oriented to Self, Oriented to  Time, Oriented to Situation, Oriented to Place Alcohol / Substance Use: Not Applicable Psych Involvement: No (comment)  Admission diagnosis:  S/P total right hip arthroplasty [W10.272] Patient Active Problem List   Diagnosis Date Noted   Status post total replacement of right hip 09/09/2023   S/P total right hip arthroplasty 09/09/2023   Inflammatory pain 09/02/2022   Pain in right hand 03/25/2022   Numbness of right hand 12/04/2021   Right hand weakness 12/04/2021   Bilateral hearing loss 11/04/2021   Hx of compression fracture of spine 08/20/2021   Senile osteoporosis 08/20/2021   Osteoarthritis of knee 08/06/2021   Unsteady gait 08/06/2021   At high risk for falls 07/24/2021   Low bone mass 07/24/2021   Pain in right knee 10/12/2019   Non-pressure chronic ulcer of other part of left foot with fat layer exposed (HCC) 11/03/2017   Encounter for  general adult medical examination without abnormal findings 05/11/2017   Acute bronchitis 05/05/2017   Injury 08/21/2016   Complete tear of right rotator cuff 06/18/2016   Localized, primary osteoarthritis of shoulder region, right 06/18/2016   Coronary artery disease involving native coronary artery of native heart without angina  pectoris 05/27/2016   Neck pain 02/19/2016   Other slipping, tripping and stumbling without falling, initial encounter 02/19/2016   Pain in thoracic spine 02/19/2016   Posttraumatic headache 02/19/2016   Height loss 01/09/2016   History of total left hip arthroplasty 01/09/2016   Left foot drop 01/09/2016   Postmenopausal status 01/09/2016   Hypokalemia 03/30/2013   Abscess of axilla, right 08/31/2011   Cough 07/29/2011   Sinusitis 07/29/2011   Acquired hallux valgus 05/11/2011   Type 2 diabetes mellitus with foot ulcer (CODE) (HCC) 05/11/2011   Microscopic hematuria 03/18/2011   Psychophysiologic insomnia 03/18/2011   Chronic systolic CHF (congestive heart failure) (HCC) 03/11/2011   Hyperlipidemia 03/11/2011   Carpal tunnel syndrome    GERD (gastroesophageal reflux disease)    Other specified abnormal findings of blood chemistry 11/22/2009   Vitamin D deficiency 11/22/2009   Rash and other nonspecific skin eruption 11/14/2009   Atrophic vaginitis 11/12/2009   Depressive disorder, not elsewhere classified 11/12/2009   Onychomycosis due to dermatophyte 11/12/2009   Arthropathy 11/07/2009   Congestive heart failure (HCC) 11/07/2009   Low back pain 11/07/2009   Other acquired deformity of ankle and foot(736.79) 11/07/2009   Anxiety disorder 09/20/2009   Cardiomyopathy (HCC) 09/20/2009   Essential hypertension 09/20/2009   Nocturia 09/20/2009   Abnormal weight gain 09/20/2009   Pain in left hip 09/20/2009   PCP:  Garlan Fillers, MD Pharmacy:   [facility] [address], [place] - [address] AT [facility] [address] [place] [address] Phone: [phone] Fax: [phone]  [facility] [address], [place] - [address] [place] [address] Phone: [phone] Fax: [phone]     Social Drivers of Health (SDOH) Social History: SDOH Screenings   Food Insecurity:  No Food Insecurity (09/10/2023)  Housing: Low Risk  (09/10/2023)  Transportation Needs: No Transportation Needs (09/10/2023)  Utilities: Not At Risk (09/10/2023)  Depression (PHQ2-9): Medium Risk (07/22/2021)  Social Connections: Moderately Integrated (09/09/2023)  Tobacco Use: Low Risk  (09/09/2023)   SDOH Interventions: Food Insecurity Interventions: Intervention Not Indicated, Inpatient TOC Housing Interventions: Intervention Not Indicated, Inpatient TOC Transportation Interventions: Intervention Not Indicated, Inpatient TOC Utilities Interventions: Intervention Not Indicated, Inpatient TOC   Readmission Risk Interventions     No data to display

## 2023-09-10 NOTE — Anesthesia Postprocedure Evaluation (Signed)
 Anesthesia Post Note  Patient: Kristin Mack  Procedure(s) Performed: ARTHROPLASTY, HIP, TOTAL, ANTERIOR APPROACH (Right: Hip)     Patient location during evaluation: PACU Anesthesia Type: MAC and Spinal Level of consciousness: oriented and awake and alert Pain management: pain level controlled Vital Signs Assessment: post-procedure vital signs reviewed and stable Respiratory status: spontaneous breathing, respiratory function stable and patient connected to nasal cannula oxygen Cardiovascular status: blood pressure returned to baseline and stable Postop Assessment: no headache, no backache and no apparent nausea or vomiting Anesthetic complications: no   No notable events documented.  Last Vitals:  Vitals:   09/10/23 0133 09/10/23 0601  BP: 106/61 110/66  Pulse: 68 67  Resp: 16 15  Temp: 36.4 C 36.7 C  SpO2: 100% 94%    Last Pain:  Vitals:   09/10/23 0650  TempSrc:   PainSc: 4                  Pryce Folts S

## 2023-09-10 NOTE — Care Management Obs Status (Signed)
 MEDICARE OBSERVATION STATUS NOTIFICATION   Patient Details  Name: Kristin Mack MRN: [id number] Date of Birth: 09/27/1940   Medicare Observation Status Notification Given:  Yes    Adrian Prows, RN 09/10/2023, 10:49 AM

## 2023-09-16 ENCOUNTER — Ambulatory Visit: Admitting: Podiatry

## 2023-09-16 DIAGNOSIS — L97521 Non-pressure chronic ulcer of other part of left foot limited to breakdown of skin: Secondary | ICD-10-CM

## 2023-09-16 DIAGNOSIS — I739 Peripheral vascular disease, unspecified: Secondary | ICD-10-CM | POA: Diagnosis not present

## 2023-09-16 MED ORDER — DOXYCYCLINE HYCLATE 100 MG PO TABS: 100.0000 mg | ORAL_TABLET | Freq: Two times a day (BID) | ORAL | 0 refills | Status: DC

## 2023-09-16 NOTE — Progress Notes (Signed)
 Subjective:  Patient ID: Kristin Mack, female    DOB: [birth year],  MRN: [id number]  Chief Complaint  Patient presents with   Foot Ulcer    83 y.o. female presents with the above complaint.  Patient presents with left hallux and second digit ulceration limited to the breakdown the skin.  They states that they have been dealing with this for quite some time.  They are known to Dr. Al Corpus.  They wanted to get it evaluated as there it was looking a little bit worse.  They have not seen anyone else prior to seeing me denies any other acute complaints pain scale 7 out of 10 dull aching nature   Review of Systems: Negative except as noted in the HPI. Denies N/V/F/Ch.  Past Medical History:  Diagnosis Date   Anxiety    Carpal tunnel syndrome    Left Hand  nueuropath L foot   Chest pain    CHF (congestive heart failure) (HCC)    Dilated cardiomyopathy. Her original ejection fraction was 10-20%. Her ejection fraction has improved to 57% by 2009   Coronary artery disease    Depression    Dyslipidemia    Foot drop, left    from a left hip repacement   GERD (gastroesophageal reflux disease)    Headache(784.0)    Hemorrhoids    History of anxiety    Hypertension    Lower back pain    Myocardial infarction [facility])    2002   Obesity    Osteoarthritis    Severe with collapse of the left hip   Pneumonia    Single functional kidney    single right kidney,  left kidney is atrophic    Current Outpatient Medications:    doxycycline (VIBRA-TABS) 100 MG tablet, Take 1 tablet (100 mg total) by mouth 2 (two) times daily., Disp: 20 tablet, Rfl: 0   acetaminophen (TYLENOL) 500 MG tablet, Take 500 mg by mouth every 6 (six) hours as needed for moderate pain (pain score 4-6)., Disp: , Rfl:    amoxicillin (AMOXIL) 500 MG capsule, Take 4 capsules by mouth as needed. 1 hr prior to dental work, Disp: , Rfl:    Ascorbic Acid (VITAMIN C) 1000 MG tablet, Take 1,000 mg by mouth daily., Disp: , Rfl:     aspirin 81 MG chewable tablet, Chew 1 tablet (81 mg total) by mouth 2 (two) times daily for 28 days., Disp: 56 tablet, Rfl: 0   atorvastatin (LIPITOR) 40 MG tablet, Take 40 mg by mouth daily., Disp: , Rfl: 1   Biotin 5000 MCG CAPS, Take 5,000 mcg by mouth daily., Disp: , Rfl:    Calcium Carb-Cholecalciferol (CALCIUM 600 + D PO), Take 1 tablet by mouth daily., Disp: , Rfl:    carvedilol (COREG) 12.5 MG tablet, TAKE 1 TABLET BY MOUTH TWICE DAILY WITH A MEAL, Disp: 180 tablet, Rfl: 2   clobetasol cream (TEMOVATE) 0.05 %, Apply 1 application  topically every other day. At night, Disp: , Rfl:    denosumab (PROLIA) 60 MG/ML SOSY injection, Inject 60 mg into the skin every 6 (six) months., Disp: , Rfl:    estradiol (ESTRACE) 0.1 MG/GM vaginal cream, estradiol 0.01% (0.1 mg/gram) vaginal cream apply locally 3 times per week, Disp: 42.5 g, Rfl: 0   ezetimibe (ZETIA) 10 MG tablet, Take 1 tablet (10 mg total) by mouth daily., Disp: 90 tablet, Rfl: 3   famotidine (PEPCID) 20 MG tablet, Take 20 mg by mouth 2 (two) times daily.,  Disp: , Rfl:    latanoprost (XALATAN) 0.005 % ophthalmic solution, Place 1 drop into both eyes at bedtime., Disp: , Rfl: 0   LORazepam (ATIVAN) 0.5 MG tablet, Take 0.5 mg by mouth 2 (two) times daily as needed for anxiety., Disp: , Rfl:    melatonin 5 MG TABS, Take 1 tablet by mouth at bedtime., Disp: , Rfl:    methocarbamol (ROBAXIN) 500 MG tablet, Take 1 tablet (500 mg total) by mouth every 6 (six) hours as needed for muscle spasms., Disp: 40 tablet, Rfl: 2   Multiple Vitamins-Minerals (CENTRUM SILVER ULTRA WOMENS) TABS, Take 1 tablet by mouth daily., Disp: , Rfl:    mupirocin ointment (BACTROBAN) 2 %, APPLY TOPICALLY TO THE AFFECTED AREA TWICE DAILY (Patient taking differently: Apply 1 Application topically daily.), Disp: 22 g, Rfl: 0   nitroGLYCERIN (NITROSTAT) 0.4 MG SL tablet, PLACE 1 TABLET BY MOUTH UNDER THE TONGUE AS NEEDED EVERY 5 MINUTES FOR CHEST PAIN, Disp: 25 tablet, Rfl:  11   Omega-3 Fatty Acids (FISH OIL) 1200 MG CAPS, Take 1,200 mg by mouth daily., Disp: , Rfl:    oxyCODONE (OXY IR/ROXICODONE) 5 MG immediate release tablet, Take 1 tablet (5 mg total) by mouth every 4 (four) hours as needed for severe pain (pain score 7-10)., Disp: 42 tablet, Rfl: 0   Polyethyl Glycol-Propyl Glycol (LUBRICATING EYE DROPS OP), Place 1 drop into both eyes daily as needed (dry eyes)., Disp: , Rfl:    polyethylene glycol (MIRALAX / GLYCOLAX) 17 g packet, Take 17 g by mouth 2 (two) times daily., Disp: 14 each, Rfl: 0   Probiotic Product (PROBIOTIC PO), Take 1 capsule by mouth daily., Disp: , Rfl:    risperiDONE (RISPERDAL) 1 MG tablet, Take 1 tablet by mouth at bedtime., Disp: , Rfl:    sacubitril-valsartan (ENTRESTO) 24-26 MG, Take 1 tablet by mouth 2 (two) times daily., Disp: 180 tablet, Rfl: 3   Sodium Fluoride (CLINPRO 5000) 1.1 % PSTE, Place 1 Application onto teeth at bedtime., Disp: , Rfl:    trimethoprim (TRIMPEX) 100 MG tablet, Take 100 mg by mouth daily., Disp: , Rfl:    venlafaxine XR (EFFEXOR-XR) 75 MG 24 hr capsule, Take 3 capsules by mouth daily., Disp: , Rfl:   Social History   Tobacco Use  Smoking Status Never  Smokeless Tobacco Never    Allergies  Allergen Reactions   Celecoxib Other (See Comments)    May have contributed to congestive heart failure    Rofecoxib     May have contributed to congestive heart failure    Nickel Itching and Rash   Objective:  There were no vitals filed for this visit. There is no height or weight on file to calculate BMI. Constitutional Well developed. Well nourished.  Vascular Dorsalis pedis pulses nonpalpable bilaterally. Posterior tibial pulses nonpalpable bilaterally. Capillary refill normal to all digits.  No cyanosis or clubbing noted. Pedal hair growth normal.  Neurologic Normal speech. Oriented to person, place, and time. Epicritic sensation to light touch grossly present bilaterally.  Dermatologic Left hallux  and second digit ulceration limited to the breakdown of the skin.  No open wounds or lesion noted no long bone exposure noted.  Does not probe down to bone.  No erythema noted.  Orthopedic: Normal joint ROM without pain or crepitus bilaterally. No visible deformities. No bony tenderness.   Radiographs: None Assessment:  No diagnosis found. Plan:  Patient was evaluated and treated and all questions answered.  Left hallux and second digit  ulceration limited to the breakdown the skin - All questions and concerns were discussed with the patient in extensive detail given the amount of breakdown that is present he will benefit from Betadine wet-to-dry dressing.  This could likely be due to toes rubbing against the shoes - Surgical shoe was dispensed to allow offloading - If it regresses patient is a high risk of undergoing digital amputation.  However will continue to clinically monitor' - Vascular surgery is following in outpatient setting continue clinically monitor the wound  No follow-ups on file.  Left hallux and second digit ulceration limited to breakdown of skin Betadine wet-to-dry surgical shoe dispensed

## 2023-09-27 NOTE — Discharge Summary (Signed)
 Patient ID: Kristin Mack MRN: [id number] DOB/AGE: 81-Mar-1942 81 y.o.  Admit date: 09/09/2023 Discharge date: 09/10/2023  Admission Diagnoses:  Right hip osteoarthritis  Discharge Diagnoses:  Principal Problem:   S/P total right hip arthroplasty Active Problems:   Status post total replacement of right hip   Past Medical History:  Diagnosis Date   Anxiety    Carpal tunnel syndrome    Left Hand  nueuropath L foot   Chest pain    CHF (congestive heart failure) (HCC)    Dilated cardiomyopathy. Her original ejection fraction was 10-20%. Her ejection fraction has improved to 57% by 2009   Coronary artery disease    Depression    Dyslipidemia    Foot drop, left    from a left hip repacement   GERD (gastroesophageal reflux disease)    Headache(784.0)    Hemorrhoids    History of anxiety    Hypertension    Lower back pain    Myocardial infarction (HCC)    2002   Obesity    Osteoarthritis    Severe with collapse of the left hip   Pneumonia    Single functional kidney    single right kidney,  left kidney is atrophic    Surgeries: Procedure(s): ARTHROPLASTY, HIP, TOTAL, ANTERIOR APPROACH on 09/09/2023   Consultants:   Discharged Condition: Improved  Hospital Course: Kristin Mack is an 83 y.o. female who was admitted 09/09/2023 for operative treatment ofS/P total right hip arthroplasty. Patient has severe unremitting pain that affects sleep, daily activities, and work/hobbies. After pre-op clearance the patient was taken to the operating room on 09/09/2023 and underwent  Procedure(s): ARTHROPLASTY, HIP, TOTAL, ANTERIOR APPROACH.    Patient was given perioperative antibiotics:  Anti-infectives (From admission, onward)    Start     Dose/Rate Route Frequency Ordered Stop   09/09/23 1500  ceFAZolin (ANCEF) IVPB 2g/100 mL premix        2 g 200 mL/hr over 30 Minutes Intravenous Every 6 hours 09/09/23 1320 09/09/23 2130   09/09/23 0645  ceFAZolin (ANCEF) IVPB 2g/100  mL premix        2 g 200 mL/hr over 30 Minutes Intravenous On call to O.R. 09/09/23 [id number] 09/09/23 0900        Patient was given sequential compression devices, early ambulation, and chemoprophylaxis to prevent DVT. Patient worked with PT and was meeting their goals regarding safe ambulation and transfers.  Patient benefited maximally from hospital stay and there were no complications.    Recent vital signs: No data found.   Recent laboratory studies: No results for input(s): "WBC", "HGB", "HCT", "PLT", "NA", "K", "CL", "CO2", "BUN", "CREATININE", "GLUCOSE", "INR", "CALCIUM" in the last 72 hours.  Invalid input(s): "PT", "2"   Discharge Medications:   Allergies as of 09/10/2023       Reactions   Celecoxib Other (See Comments)   May have contributed to congestive heart failure    Rofecoxib    May have contributed to congestive heart failure    Nickel Itching, Rash        Medication List     STOP taking these medications    aspirin 81 MG tablet Replaced by: aspirin 81 MG chewable tablet   ibuprofen 200 MG tablet Commonly known as: ADVIL       TAKE these medications    acetaminophen 500 MG tablet Commonly known as: TYLENOL Take 500 mg by mouth every 6 (six) hours as needed for moderate pain (pain score 4-6).  amoxicillin 500 MG capsule Commonly known as: AMOXIL Take 4 capsules by mouth as needed. 1 hr prior to dental work   aspirin 81 MG chewable tablet Chew 1 tablet (81 mg total) by mouth 2 (two) times daily for 28 days. Replaces: aspirin 81 MG tablet   atorvastatin 40 MG tablet Commonly known as: LIPITOR Take 40 mg by mouth daily.   Biotin 5000 MCG Caps Take 5,000 mcg by mouth daily.   CALCIUM 600 + D PO Take 1 tablet by mouth daily.   carvedilol 12.5 MG tablet Commonly known as: COREG TAKE 1 TABLET BY MOUTH TWICE DAILY WITH A MEAL   Centrum Silver Ultra Womens Tabs Take 1 tablet by mouth daily.   Clinpro 5000 1.1 % Pste Generic drug: Sodium  Fluoride Place 1 Application onto teeth at bedtime.   clobetasol cream 0.05 % Commonly known as: TEMOVATE Apply 1 application  topically every other day. At night   denosumab 60 MG/ML Sosy injection Commonly known as: PROLIA Inject 60 mg into the skin every 6 (six) months.   Entresto 24-26 MG Generic drug: sacubitril-valsartan Take 1 tablet by mouth 2 (two) times daily.   estradiol 0.1 MG/GM vaginal cream Commonly known as: ESTRACE estradiol 0.01% (0.1 mg/gram) vaginal cream apply locally 3 times per week   ezetimibe 10 MG tablet Commonly known as: ZETIA Take 1 tablet (10 mg total) by mouth daily.   famotidine 20 MG tablet Commonly known as: PEPCID Take 20 mg by mouth 2 (two) times daily.   Fish Oil 1200 MG Caps Take 1,200 mg by mouth daily.   latanoprost 0.005 % ophthalmic solution Commonly known as: XALATAN Place 1 drop into both eyes at bedtime.   LORazepam 0.5 MG tablet Commonly known as: ATIVAN Take 0.5 mg by mouth 2 (two) times daily as needed for anxiety.   LUBRICATING EYE DROPS OP Place 1 drop into both eyes daily as needed (dry eyes).   melatonin 5 MG Tabs Take 1 tablet by mouth at bedtime.   methocarbamol 500 MG tablet Commonly known as: ROBAXIN Take 1 tablet (500 mg total) by mouth every 6 (six) hours as needed for muscle spasms.   mupirocin ointment 2 % Commonly known as: BACTROBAN APPLY TOPICALLY TO THE AFFECTED AREA TWICE DAILY What changed: See the new instructions.   nitroGLYCERIN 0.4 MG SL tablet Commonly known as: NITROSTAT PLACE 1 TABLET BY MOUTH UNDER THE TONGUE AS NEEDED EVERY 5 MINUTES FOR CHEST PAIN   oxyCODONE 5 MG immediate release tablet Commonly known as: Oxy IR/ROXICODONE Take 1 tablet (5 mg total) by mouth every 4 (four) hours as needed for severe pain (pain score 7-10).   polyethylene glycol 17 g packet Commonly known as: MIRALAX / GLYCOLAX Take 17 g by mouth 2 (two) times daily.   PROBIOTIC PO Take 1 capsule by mouth  daily.   risperiDONE 1 MG tablet Commonly known as: RISPERDAL Take 1 tablet by mouth at bedtime.   trimethoprim 100 MG tablet Commonly known as: TRIMPEX Take 100 mg by mouth daily.   venlafaxine XR 75 MG 24 hr capsule Commonly known as: EFFEXOR-XR Take 3 capsules by mouth daily.   vitamin C 1000 MG tablet Take 1,000 mg by mouth daily.       ASK your doctor about these medications    senna 8.6 MG Tabs tablet Commonly known as: SENOKOT Take 2 tablets (17.2 mg total) by mouth at bedtime for 14 days. Ask about: Should I take this medication?  Discharge Care Instructions  (From admission, onward)           Start     Ordered   09/10/23 0000  Change dressing       Comments: Maintain surgical dressing until follow up in the clinic. If the edges start to pull up, may reinforce with tape. If the dressing is no longer working, may remove and cover with gauze and tape, but must keep the area dry and clean.  Call with any questions or concerns.   09/10/23 0753            Diagnostic Studies: DG Pelvis Portable Result Date: 09/09/2023 CLINICAL DATA:  Status post right hip arthroplasty. EXAM: PORTABLE PELVIS 1-2 VIEWS COMPARISON:  Preoperative exam 03/11/2019 FINDINGS: Right hip arthroplasty in expected alignment. No periprosthetic lucency or fracture. Recent postsurgical change includes air and edema in the soft tissues. Previous left hip arthroplasty. Calcified fibroid in the pelvis. IMPRESSION: Right hip arthroplasty without immediate postoperative complication. Electronically Signed   By: Narda Rutherford M.D.   On: 09/09/2023 15:07   DG HIP UNILAT WITH PELVIS 1V RIGHT Result Date: 09/09/2023 CLINICAL DATA:  Elective surgery. EXAM: DG HIP (WITH OR WITHOUT PELVIS) 1V RIGHT COMPARISON:  None Available. FINDINGS: Three fluoroscopic spot views of the pelvis and right hip obtained in the operating room. Images during hip arthroplasty. Fluoroscopy time 14 seconds.  Dose 1.9281 mGy. IMPRESSION: Intraoperative fluoroscopy during right hip arthroplasty. Electronically Signed   By: Narda Rutherford M.D.   On: 09/09/2023 10:34   DG C-Arm 1-60 Min-No Report Result Date: 09/09/2023 Fluoroscopy was utilized by the requesting physician.  No radiographic interpretation.   DG C-Arm 1-60 Min-No Report Result Date: 09/09/2023 Fluoroscopy was utilized by the requesting physician.  No radiographic interpretation.    Disposition: Discharge disposition: 01-Home or Self Care       Discharge Instructions     Call MD / Call 911   Complete by: As directed    If you experience chest pain or shortness of breath, CALL 911 and be transported to the hospital emergency room.  If you develope a fever above 101 F, pus (white drainage) or increased drainage or redness at the wound, or calf pain, call your surgeon's office.   Change dressing   Complete by: As directed    Maintain surgical dressing until follow up in the clinic. If the edges start to pull up, may reinforce with tape. If the dressing is no longer working, may remove and cover with gauze and tape, but must keep the area dry and clean.  Call with any questions or concerns.   Constipation Prevention   Complete by: As directed    Drink plenty of fluids.  Prune juice may be helpful.  You may use a stool softener, such as Colace (over the counter) 100 mg twice a day.  Use MiraLax (over the counter) for constipation as needed.   Diet - low sodium heart healthy   Complete by: As directed    Increase activity slowly as tolerated   Complete by: As directed    Weight bearing as tolerated with assist device (walker, cane, etc) as directed, use it as long as suggested by your surgeon or therapist, typically at least 4-6 weeks.   Post-operative opioid taper instructions:   Complete by: As directed    POST-OPERATIVE OPIOID TAPER INSTRUCTIONS: It is important to wean off of your opioid medication as soon as possible. If you  do not need pain medication after  your surgery it is ok to stop day one. Opioids include: Codeine, Hydrocodone(Norco, Vicodin), Oxycodone(Percocet, oxycontin) and hydromorphone amongst others.  Long term and even short term use of opiods can cause: Increased pain response Dependence Constipation Depression Respiratory depression And more.  Withdrawal symptoms can include Flu like symptoms Nausea, vomiting And more Techniques to manage these symptoms Hydrate well Eat regular healthy meals Stay active Use relaxation techniques(deep breathing, meditating, yoga) Do Not substitute Alcohol to help with tapering If you have been on opioids for less than two weeks and do not have pain than it is ok to stop all together.  Plan to wean off of opioids This plan should start within one week post op of your joint replacement. Maintain the same interval or time between taking each dose and first decrease the dose.  Cut the total daily intake of opioids by one tablet each day Next start to increase the time between doses. The last dose that should be eliminated is the evening dose.      TED hose   Complete by: As directed    Use stockings (TED hose) for 2 weeks on both leg(s).  You may remove them at night for sleeping.        Follow-up Information     Durene Romans, MD. Schedule an appointment as soon as possible for a visit in 2 week(s).   Specialty: Orthopedic Surgery Contact information: [address] [place] [address] [phone]                  Signed: Cassandria Anger 09/27/2023, 7:11 AM

## 2023-09-28 ENCOUNTER — Ambulatory Visit: Admitting: Podiatry

## 2023-09-30 ENCOUNTER — Ambulatory Visit: Admitting: Podiatry

## 2023-10-01 ENCOUNTER — Ambulatory Visit: Admitting: Podiatry

## 2023-10-01 DIAGNOSIS — L97521 Non-pressure chronic ulcer of other part of left foot limited to breakdown of skin: Secondary | ICD-10-CM | POA: Diagnosis not present

## 2023-10-01 DIAGNOSIS — I739 Peripheral vascular disease, unspecified: Secondary | ICD-10-CM

## 2023-10-01 NOTE — Progress Notes (Signed)
 Subjective:  Patient ID: Kristin Mack, female    DOB: 24-Dec-1940,  MRN: [id number]  Chief Complaint  Patient presents with   Foot Ulcer    Left foot toe ulcer follow up     83 y.o. female presents with the above complaint.  Patient presents with left hallux and second digit ulceration she says doing a lot better she has been doing Betadine  wet-to-dry dressing denies any other acute complaints  Review of Systems: Negative except as noted in the HPI. Denies N/V/F/Ch.  Past Medical History:  Diagnosis Date   Anxiety    Carpal tunnel syndrome    Left Hand  nueuropath L foot   Chest pain    CHF (congestive heart failure) (HCC)    Dilated cardiomyopathy. Her original ejection fraction was 10-20%. Her ejection fraction has improved to 57% by 2009   Coronary artery disease    Depression    Dyslipidemia    Foot drop, left    from a left hip repacement   GERD (gastroesophageal reflux disease)    Headache(784.0)    Hemorrhoids    History of anxiety    Hypertension    Lower back pain    Myocardial infarction (HCC)    2002   Obesity    Osteoarthritis    Severe with collapse of the left hip   Pneumonia    Single functional kidney    single right kidney,  left kidney is atrophic    Current Outpatient Medications:    acetaminophen  (TYLENOL ) 500 MG tablet, Take 500 mg by mouth every 6 (six) hours as needed for moderate pain (pain score 4-6)., Disp: , Rfl:    amoxicillin  (AMOXIL ) 500 MG capsule, Take 4 capsules by mouth as needed. 1 hr prior to dental work, Disp: , Rfl:    Ascorbic Acid (VITAMIN C) 1000 MG tablet, Take 1,000 mg by mouth daily., Disp: , Rfl:    aspirin  81 MG chewable tablet, Chew 1 tablet (81 mg total) by mouth 2 (two) times daily for 28 days., Disp: 56 tablet, Rfl: 0   atorvastatin  (LIPITOR) 40 MG tablet, Take 40 mg by mouth daily., Disp: , Rfl: 1   Biotin 5000 MCG CAPS, Take 5,000 mcg by mouth daily., Disp: , Rfl:    Calcium  Carb-Cholecalciferol (CALCIUM  600 +  D PO), Take 1 tablet by mouth daily., Disp: , Rfl:    carvedilol  (COREG ) 12.5 MG tablet, TAKE 1 TABLET BY MOUTH TWICE DAILY WITH A MEAL, Disp: 180 tablet, Rfl: 2   clobetasol cream (TEMOVATE) 0.05 %, Apply 1 application  topically every other day. At night, Disp: , Rfl:    denosumab  (PROLIA ) 60 MG/ML SOSY injection, Inject 60 mg into the skin every 6 (six) months., Disp: , Rfl:    doxycycline  (VIBRA -TABS) 100 MG tablet, Take 1 tablet (100 mg total) by mouth 2 (two) times daily., Disp: 20 tablet, Rfl: 0   estradiol  (ESTRACE ) 0.1 MG/GM vaginal cream, estradiol  0.01% (0.1 mg/gram) vaginal cream apply locally 3 times per week, Disp: 42.5 g, Rfl: 0   ezetimibe  (ZETIA ) 10 MG tablet, Take 1 tablet (10 mg total) by mouth daily., Disp: 90 tablet, Rfl: 3   famotidine  (PEPCID ) 20 MG tablet, Take 20 mg by mouth 2 (two) times daily., Disp: , Rfl:    latanoprost  (XALATAN ) 0.005 % ophthalmic solution, Place 1 drop into both eyes at bedtime., Disp: , Rfl: 0   LORazepam  (ATIVAN ) 0.5 MG tablet, Take 0.5 mg by mouth 2 (two) times daily as  needed for anxiety., Disp: , Rfl:    melatonin 5 MG TABS, Take 1 tablet by mouth at bedtime., Disp: , Rfl:    methocarbamol  (ROBAXIN ) 500 MG tablet, Take 1 tablet (500 mg total) by mouth every 6 (six) hours as needed for muscle spasms., Disp: 40 tablet, Rfl: 2   Multiple Vitamins-Minerals (CENTRUM SILVER  ULTRA WOMENS) TABS, Take 1 tablet by mouth daily., Disp: , Rfl:    mupirocin  ointment (BACTROBAN ) 2 %, APPLY TOPICALLY TO THE AFFECTED AREA TWICE DAILY (Patient taking differently: Apply 1 Application topically daily.), Disp: 22 g, Rfl: 0   nitroGLYCERIN  (NITROSTAT ) 0.4 MG SL tablet, PLACE 1 TABLET BY MOUTH UNDER THE TONGUE AS NEEDED EVERY 5 MINUTES FOR CHEST PAIN, Disp: 25 tablet, Rfl: 11   Omega-3 Fatty Acids (FISH OIL) 1200 MG CAPS, Take 1,200 mg by mouth daily., Disp: , Rfl:    oxyCODONE  (OXY IR/ROXICODONE ) 5 MG immediate release tablet, Take 1 tablet (5 mg total) by mouth every 4  (four) hours as needed for severe pain (pain score 7-10)., Disp: 42 tablet, Rfl: 0   Polyethyl Glycol-Propyl Glycol (LUBRICATING EYE DROPS OP), Place 1 drop into both eyes daily as needed (dry eyes)., Disp: , Rfl:    polyethylene glycol (MIRALAX  / GLYCOLAX ) 17 g packet, Take 17 g by mouth 2 (two) times daily., Disp: 14 each, Rfl: 0   Probiotic Product (PROBIOTIC PO), Take 1 capsule by mouth daily., Disp: , Rfl:    risperiDONE  (RISPERDAL ) 1 MG tablet, Take 1 tablet by mouth at bedtime., Disp: , Rfl:    sacubitril -valsartan  (ENTRESTO ) 24-26 MG, Take 1 tablet by mouth 2 (two) times daily., Disp: 180 tablet, Rfl: 3   Sodium Fluoride (CLINPRO 5000) 1.1 % PSTE, Place 1 Application onto teeth at bedtime., Disp: , Rfl:    trimethoprim (TRIMPEX) 100 MG tablet, Take 100 mg by mouth daily., Disp: , Rfl:    venlafaxine  XR (EFFEXOR -XR) 75 MG 24 hr capsule, Take 3 capsules by mouth daily., Disp: , Rfl:   Social History   Tobacco Use  Smoking Status Never  Smokeless Tobacco Never    Allergies  Allergen Reactions   Celecoxib Other (See Comments)    May have contributed to congestive heart failure    Rofecoxib     May have contributed to congestive heart failure    Nickel Itching and Rash   Objective:  There were no vitals filed for this visit. There is no height or weight on file to calculate BMI. Constitutional Well developed. Well nourished.  Vascular Dorsalis pedis pulses nonpalpable bilaterally. Posterior tibial pulses nonpalpable bilaterally. Capillary refill normal to all digits.  No cyanosis or clubbing noted. Pedal hair growth normal.  Neurologic Normal speech. Oriented to person, place, and time. Epicritic sensation to light touch grossly present bilaterally.  Dermatologic No further ulceration noted to left hallux and second digit.  Mild pain on palpation no open wounds or lesion noted no long bone exposure noted.  Does not probe down to bone.  No erythema noted.  Orthopedic: Normal  joint ROM without pain or crepitus bilaterally. No visible deformities. No bony tenderness.   Radiographs: None Assessment:  No diagnosis found. Plan:  Patient was evaluated and treated and all questions answered.  Left hallux and second digit ulceration limited to the breakdown the skin - Clinically healed and officially discharged from my care.  If any foot and ankle issues or in the future she will come back and see me.  At this time I discussed  shoe gear modification.  If there is a regression of the ulceration I discussed with her to come see me right away she states understanding  No follow-ups on file.

## 2023-10-04 DIAGNOSIS — L97521 Non-pressure chronic ulcer of other part of left foot limited to breakdown of skin: Secondary | ICD-10-CM | POA: Diagnosis not present

## 2023-10-04 DIAGNOSIS — R197 Diarrhea, unspecified: Secondary | ICD-10-CM | POA: Diagnosis not present

## 2023-10-04 DIAGNOSIS — K921 Melena: Secondary | ICD-10-CM | POA: Diagnosis not present

## 2023-10-05 DIAGNOSIS — H401111 Primary open-angle glaucoma, right eye, mild stage: Secondary | ICD-10-CM | POA: Diagnosis not present

## 2023-10-05 DIAGNOSIS — H401123 Primary open-angle glaucoma, left eye, severe stage: Secondary | ICD-10-CM | POA: Diagnosis not present

## 2023-10-06 DIAGNOSIS — K921 Melena: Secondary | ICD-10-CM | POA: Diagnosis not present

## 2023-10-06 DIAGNOSIS — R197 Diarrhea, unspecified: Secondary | ICD-10-CM | POA: Diagnosis not present

## 2023-10-07 DIAGNOSIS — R350 Frequency of micturition: Secondary | ICD-10-CM | POA: Diagnosis not present

## 2023-10-07 DIAGNOSIS — F429 Obsessive-compulsive disorder, unspecified: Secondary | ICD-10-CM | POA: Diagnosis not present

## 2023-10-13 DIAGNOSIS — Z96641 Presence of right artificial hip joint: Secondary | ICD-10-CM | POA: Diagnosis not present

## 2023-10-13 DIAGNOSIS — Z471 Aftercare following joint replacement surgery: Secondary | ICD-10-CM | POA: Diagnosis not present

## 2023-10-21 DIAGNOSIS — F429 Obsessive-compulsive disorder, unspecified: Secondary | ICD-10-CM | POA: Diagnosis not present

## 2023-10-26 ENCOUNTER — Ambulatory Visit: Payer: Medicare PPO | Admitting: Vascular Surgery

## 2023-10-26 ENCOUNTER — Ambulatory Visit
Admission: RE | Admit: 2023-10-26 | Discharge: 2023-10-26 | Disposition: A | Discharge status: Home / Self Care | Payer: Medicare PPO | Source: Ambulatory Visit | Attending: Obstetrics and Gynecology | Admitting: Obstetrics and Gynecology

## 2023-10-26 DIAGNOSIS — Z1231 Encounter for screening mammogram for malignant neoplasm of breast: Secondary | ICD-10-CM | POA: Diagnosis not present

## 2023-10-28 ENCOUNTER — Encounter: Payer: Self-pay | Admitting: Podiatry

## 2023-10-28 ENCOUNTER — Ambulatory Visit: Admitting: Podiatry

## 2023-10-28 DIAGNOSIS — I739 Peripheral vascular disease, unspecified: Secondary | ICD-10-CM

## 2023-10-28 DIAGNOSIS — Z1211 Encounter for screening for malignant neoplasm of colon: Secondary | ICD-10-CM | POA: Diagnosis not present

## 2023-10-28 DIAGNOSIS — L97521 Non-pressure chronic ulcer of other part of left foot limited to breakdown of skin: Secondary | ICD-10-CM

## 2023-11-01 NOTE — Progress Notes (Signed)
 She presents today for follow-up of ulceration between the 1st and 2nd toes of the left foot.  She is adamant think that Dr. Lydia Sams saw this the last time I was in.  But also need to have a nail trim.  Objective: Vital signs are stable alert oriented x 3.  Juxtaposition of the hallux and the second to his left small reactive hyperkeratotic lesion that is not open on the left foot the tips of the toes.  She does however have her chronic ulceration that is currently is closed to the tip of the toe left.  Assessment: Well-healing ulcerative lesion hallux left.  Pain limb secondary onychomycosis.  Plan: Debridement of ulcerative lesion today did not bring it to bleeding.  Does not appear to be open and appears to be resolving at this time I also debrided her nails.

## 2023-11-02 DIAGNOSIS — Z9181 History of falling: Secondary | ICD-10-CM | POA: Diagnosis not present

## 2023-11-02 DIAGNOSIS — M810 Age-related osteoporosis without current pathological fracture: Secondary | ICD-10-CM | POA: Diagnosis not present

## 2023-11-02 DIAGNOSIS — Z96642 Presence of left artificial hip joint: Secondary | ICD-10-CM | POA: Diagnosis not present

## 2023-11-02 DIAGNOSIS — M21372 Foot drop, left foot: Secondary | ICD-10-CM | POA: Diagnosis not present

## 2023-11-02 DIAGNOSIS — K219 Gastro-esophageal reflux disease without esophagitis: Secondary | ICD-10-CM | POA: Diagnosis not present

## 2023-11-02 DIAGNOSIS — Z5181 Encounter for therapeutic drug level monitoring: Secondary | ICD-10-CM | POA: Diagnosis not present

## 2023-11-02 DIAGNOSIS — M8580 Other specified disorders of bone density and structure, unspecified site: Secondary | ICD-10-CM | POA: Diagnosis not present

## 2023-11-02 DIAGNOSIS — Z8781 Personal history of (healed) traumatic fracture: Secondary | ICD-10-CM | POA: Diagnosis not present

## 2023-11-03 ENCOUNTER — Telehealth: Payer: Self-pay | Admitting: Pharmacy Technician

## 2023-11-03 ENCOUNTER — Other Ambulatory Visit (HOSPITAL_COMMUNITY): Payer: Self-pay

## 2023-11-03 ENCOUNTER — Telehealth: Payer: Self-pay | Admitting: Cardiovascular Disease

## 2023-11-03 DIAGNOSIS — F429 Obsessive-compulsive disorder, unspecified: Secondary | ICD-10-CM | POA: Diagnosis not present

## 2023-11-03 MED ORDER — ENTRESTO 24-26 MG PO TABS: 1.0000 | ORAL_TABLET | Freq: Two times a day (BID) | ORAL | 2 refills | Status: AC

## 2023-11-03 NOTE — Telephone Encounter (Signed)
 RX sent to requested Pharmacy

## 2023-11-03 NOTE — Telephone Encounter (Signed)
 Pt is calling to start her pt assistance for  sacubitril -valsartan  (ENTRESTO ) 24-26 MG

## 2023-11-03 NOTE — Telephone Encounter (Signed)
 Will forward this message to our pt assistance team for further management and follow-up with the pt.  Will cc in Dr. Letta Raw covering RN as well.

## 2023-11-03 NOTE — Telephone Encounter (Signed)
 Lynett Sarah, CPhT  Peggi Bowels, LPN Replies will be sent to P Rx Med Assistance Team Caller: Unspecified (Today,  8:18 AM) Hi! The grant is approved and the Pharmacy and the patient are aware. Thank you!

## 2023-11-03 NOTE — Telephone Encounter (Signed)
 Patient Advocate Encounter   The patient was approved for a [employer] grant that will help cover the cost of Entresto  Total amount awarded, 4500.00.  Effective: 11/08/23 - 11/06/24   [id number] [id number]  [id number]  [employer] ID: [id number]   Pharmacy provided with approval and processing information. Patient informed via phone

## 2023-11-03 NOTE — Telephone Encounter (Signed)
*  STAT* If patient is at the pharmacy, call can be transferred to refill team.   1. Which medications need to be refilled? (please list name of each medication and dose if known) sacubitril -valsartan  (ENTRESTO ) 24-26 MG   2. Which pharmacy/location (including street and city if local pharmacy) is medication to be sent to? [id number] - [place], [place] - [address] AT [facility] [address] Phone: [phone]  Fax: [phone]     3. Do they need a 30 day or 90 day supply? 90

## 2023-11-05 LAB — COLOGUARD: COLOGUARD: POSITIVE — AB

## 2023-11-16 ENCOUNTER — Ambulatory Visit: Payer: Medicare PPO | Admitting: Vascular Surgery

## 2023-11-16 DIAGNOSIS — I5040 Unspecified combined systolic (congestive) and diastolic (congestive) heart failure: Secondary | ICD-10-CM | POA: Diagnosis not present

## 2023-11-16 DIAGNOSIS — R195 Other fecal abnormalities: Secondary | ICD-10-CM | POA: Diagnosis not present

## 2023-11-16 DIAGNOSIS — K219 Gastro-esophageal reflux disease without esophagitis: Secondary | ICD-10-CM | POA: Diagnosis not present

## 2023-11-16 DIAGNOSIS — Z1211 Encounter for screening for malignant neoplasm of colon: Secondary | ICD-10-CM | POA: Diagnosis not present

## 2023-11-16 DIAGNOSIS — I429 Cardiomyopathy, unspecified: Secondary | ICD-10-CM | POA: Diagnosis not present

## 2023-11-17 DIAGNOSIS — F429 Obsessive-compulsive disorder, unspecified: Secondary | ICD-10-CM | POA: Diagnosis not present

## 2023-11-22 DIAGNOSIS — Z96642 Presence of left artificial hip joint: Secondary | ICD-10-CM | POA: Diagnosis not present

## 2023-11-22 DIAGNOSIS — R42 Dizziness and giddiness: Secondary | ICD-10-CM | POA: Diagnosis not present

## 2023-11-22 DIAGNOSIS — M8580 Other specified disorders of bone density and structure, unspecified site: Secondary | ICD-10-CM | POA: Diagnosis not present

## 2023-11-22 DIAGNOSIS — M810 Age-related osteoporosis without current pathological fracture: Secondary | ICD-10-CM | POA: Diagnosis not present

## 2023-11-22 DIAGNOSIS — K219 Gastro-esophageal reflux disease without esophagitis: Secondary | ICD-10-CM | POA: Diagnosis not present

## 2023-11-22 DIAGNOSIS — Z5181 Encounter for therapeutic drug level monitoring: Secondary | ICD-10-CM | POA: Diagnosis not present

## 2023-11-22 DIAGNOSIS — Z8781 Personal history of (healed) traumatic fracture: Secondary | ICD-10-CM | POA: Diagnosis not present

## 2023-11-22 DIAGNOSIS — E11621 Type 2 diabetes mellitus with foot ulcer: Secondary | ICD-10-CM | POA: Diagnosis not present

## 2023-11-22 DIAGNOSIS — I5042 Chronic combined systolic (congestive) and diastolic (congestive) heart failure: Secondary | ICD-10-CM | POA: Diagnosis not present

## 2023-11-22 DIAGNOSIS — R110 Nausea: Secondary | ICD-10-CM | POA: Diagnosis not present

## 2023-11-22 DIAGNOSIS — Z9181 History of falling: Secondary | ICD-10-CM | POA: Diagnosis not present

## 2023-11-22 DIAGNOSIS — R2681 Unsteadiness on feet: Secondary | ICD-10-CM | POA: Diagnosis not present

## 2023-12-01 ENCOUNTER — Telehealth: Payer: Self-pay | Admitting: Cardiovascular Disease

## 2023-12-01 DIAGNOSIS — F429 Obsessive-compulsive disorder, unspecified: Secondary | ICD-10-CM | POA: Diagnosis not present

## 2023-12-01 NOTE — Telephone Encounter (Signed)
 Patient stated she had hip replacement surgery in March and will have a colonoscopy on 6/27.  Patient wants to know how long she should wait before having her right knee replaced.

## 2023-12-01 NOTE — Telephone Encounter (Signed)
 Patient would like to know how long should she wait after her hip replacement in march to have a knee replacement done with her heart concerns

## 2023-12-02 ENCOUNTER — Encounter: Payer: Self-pay | Admitting: Podiatry

## 2023-12-02 ENCOUNTER — Telehealth: Payer: Self-pay | Admitting: Cardiovascular Disease

## 2023-12-02 ENCOUNTER — Telehealth: Payer: Self-pay

## 2023-12-02 ENCOUNTER — Ambulatory Visit: Admitting: Podiatry

## 2023-12-02 DIAGNOSIS — L97521 Non-pressure chronic ulcer of other part of left foot limited to breakdown of skin: Secondary | ICD-10-CM | POA: Diagnosis not present

## 2023-12-02 DIAGNOSIS — M1711 Unilateral primary osteoarthritis, right knee: Secondary | ICD-10-CM | POA: Diagnosis not present

## 2023-12-02 DIAGNOSIS — Z5189 Encounter for other specified aftercare: Secondary | ICD-10-CM | POA: Diagnosis not present

## 2023-12-02 NOTE — Telephone Encounter (Signed)
 Primary Cardiologist:Philip Nahser, MD   Preoperative team, please contact this patient and set up a phone call appointment for further preoperative risk assessment. Please obtain consent and complete medication review. Thank you for your help.   I confirm that guidance regarding antiplatelet and oral anticoagulation therapy has been completed and, if necessary, noted below.  Per office protocol, she may hold aspirin  for 5-7 days prior to procedure and should resume as soon as hemodynamically stable postoperatively.  I also confirmed the patient resides in the state of [place] . As per [facility] Medical Board telemedicine laws, the patient must reside in the state in which the provider is licensed.   Kristin Koch, NP-C  12/02/2023, 4:27 PM [address], [address] [place], [place] [address] Office [phone] Fax [phone]

## 2023-12-02 NOTE — Telephone Encounter (Signed)
 Pt has just received her letter in the mail regarding MD retirement and would like 2 recommendations from him. Pt only wanted recommendations from him and declined any other offer. Please advise

## 2023-12-02 NOTE — Telephone Encounter (Signed)
   Pre-operative Risk Assessment    Patient Name: Kristin Mack  DOB: 02-18-1941 MRN: [id number]   Date of last office visit: 07/27/23 Kristin Jain, MD Date of next office visit: NONE   Request for Surgical Clearance    Procedure:  COLONOSCOPY  Date of Surgery:  Clearance 12/17/23                                Surgeon:  DR Nickey Barn Surgeon's Group or Practice Name:  [facility], [place] Phone number:  [phone] Fax number:  [phone]   Type of Clearance Requested:   - Medical  - Pharmacy:  Hold Aspirin      Type of Anesthesia:  PROPOFOL    Additional requests/questions:    SignedCollin Deal   12/02/2023, 1:09 PM

## 2023-12-02 NOTE — Progress Notes (Signed)
 She presents today for follow-up of her chronic ulceration to the distal medial aspect of her hallux left.  She states that she has been off of her feet a lot more trying to let this heal.  Has not been going to the pool because she has a hard time getting there because of her car.  She states that her toenails are long and painful would like to have them cut if possible.  Objective: Vital signs stable alert oriented x 3 there is no erythema edema salines drainage or odor pulses are palpable.  Reactive hyperkeratotic tissue along the distal medial aspect of the hallux left with severe hallux interphalangeal once debrided does not demonstrate any open wounds at this point.  Otherwise the toenails are long thick yellow dystrophic clinically mycotic painful palpation as well as debridement.  Assessment: Pain in limb secondary to ulcerative lesion and mycotic nails left.  Plan: Debridement of toenails 1 through 5 bilaterally.  Debridement of ulceration.  Follow-up with her in 3 months

## 2023-12-02 NOTE — Telephone Encounter (Signed)
 Spoke with pt, aware dr Alroy Aspen is not in the office this week but will forward for his review.

## 2023-12-03 ENCOUNTER — Telehealth: Payer: Self-pay

## 2023-12-03 NOTE — Telephone Encounter (Signed)
 Tried to call the pt to schedule a tele preop appt though no answer.

## 2023-12-03 NOTE — Telephone Encounter (Signed)
 Med Rec and Consent done    Patient Consent for Virtual Visit        Kristin Mack has provided verbal consent on 12/03/2023 for a virtual visit (video or telephone).   CONSENT FOR VIRTUAL VISIT FOR:  Kristin Mack  By participating in this virtual visit I agree to the following:  I hereby voluntarily request, consent and authorize [facility] and its employed or contracted physicians, physician assistants, nurse practitioners or other licensed health care professionals (the Practitioner), to provide me with telemedicine health care services (the "Services) as deemed necessary by the treating Practitioner. I acknowledge and consent to receive the Services by the Practitioner via telemedicine. I understand that the telemedicine visit will involve communicating with the Practitioner through live audiovisual communication technology and the disclosure of certain medical information by electronic transmission. I acknowledge that I have been given the opportunity to request an in-person assessment or other available alternative prior to the telemedicine visit and am voluntarily participating in the telemedicine visit.  I understand that I have the right to withhold or withdraw my consent to the use of telemedicine in the course of my care at any time, without affecting my right to future care or treatment, and that the Practitioner or I may terminate the telemedicine visit at any time. I understand that I have the right to inspect all information obtained and/or recorded in the course of the telemedicine visit and may receive copies of available information for a reasonable fee.  I understand that some of the potential risks of receiving the Services via telemedicine include:  Delay or interruption in medical evaluation due to technological equipment failure or disruption; Information transmitted may not be sufficient (e.g. poor resolution of images) to allow for appropriate  medical decision making by the Practitioner; and/or  In rare instances, security protocols could fail, causing a breach of personal health information.  Furthermore, I acknowledge that it is my responsibility to provide information about my medical history, conditions and care that is complete and accurate to the best of my ability. I acknowledge that Practitioner's advice, recommendations, and/or decision may be based on factors not within their control, such as incomplete or inaccurate data provided by me or distortions of diagnostic images or specimens that may result from electronic transmissions. I understand that the practice of medicine is not an exact science and that Practitioner makes no warranties or guarantees regarding treatment outcomes. I acknowledge that a copy of this consent can be made available to me via my patient portal [facility] MyChart), or I can request a printed copy by calling the office of [facility].    I understand that my insurance will be billed for this visit.   I have read or had this consent read to me. I understand the contents of this consent, which adequately explains the benefits and risks of the Services being provided via telemedicine.  I have been provided ample opportunity to ask questions regarding this consent and the Services and have had my questions answered to my satisfaction. I give my informed consent for the services to be provided through the use of telemedicine in my medical care

## 2023-12-03 NOTE — Telephone Encounter (Signed)
 S/W pt and scheduled TELE Preop appt 12/08/23. Med Rec and Consent done

## 2023-12-03 NOTE — Telephone Encounter (Signed)
 Returned call to patient and provided her Dr Letta Raw comments. No further questions.

## 2023-12-07 ENCOUNTER — Telehealth: Payer: Self-pay | Admitting: *Deleted

## 2023-12-07 NOTE — Telephone Encounter (Signed)
   Pre-operative Risk Assessment    Patient Name: Kristin Mack  DOB: [birth year] MRN: [id number]   Date of last office visit: 07/27/23 DR. NAHSER Date of next office visit: 12/08/23 TELE PREOP APPT FOR ANOTHER PROCEDURE CLEARANCE    Request for Surgical Clearance    Procedure:  RIGHT TOTAL KNEE ARTHROPLASTY  Date of Surgery:  Clearance 02/01/24                                Surgeon:  DR. MATTHEW OLIN Surgeon's Group or Practice Name:  [facility] Phone number:  [phone] Amanda Jungling Fax number:  [phone]   Type of Clearance Requested:   - Medical  - Pharmacy:  Hold Aspirin      Type of Anesthesia:  Spinal   Additional requests/questions:    Princeton Broom   12/07/2023, 11:08 AM

## 2023-12-08 ENCOUNTER — Telehealth: Payer: Self-pay | Admitting: Cardiovascular Disease

## 2023-12-08 ENCOUNTER — Ambulatory Visit: Attending: Cardiology | Admitting: Student

## 2023-12-08 DIAGNOSIS — Z01810 Encounter for preprocedural cardiovascular examination: Secondary | ICD-10-CM

## 2023-12-08 NOTE — Telephone Encounter (Signed)
 Called and unable to reach patient , left message on patient's personal; cell phone to call office.

## 2023-12-08 NOTE — Telephone Encounter (Signed)
 Patient is scheduled for telephone visit today, 12/08/2023, for pre-op evaluation prior to colonoscopy. I will address evaluation for upcoming right knee replacement on 01/31/2024 as well.   Will remove this request from pre-op pool.   Lonell Rives. Cherysh Epperly, DNP, NP-C  12/08/2023, 8:19 AM [facility] [address] Office [phone] Fax [phone]

## 2023-12-08 NOTE — Telephone Encounter (Signed)
 Spoke with pt, aware of dr nahser's recommendations.  Follow up scheduled to get established.

## 2023-12-08 NOTE — Telephone Encounter (Signed)
 Patient calling to see how soon after have a colonoscopy, should she have a knee replacement surgery. Please advise

## 2023-12-08 NOTE — Progress Notes (Signed)
 Virtual Visit via Telephone Note   Because of Kristin Mack's co-morbid illnesses, she is at least at moderate risk for complications without adequate follow up.  This format is felt to be most appropriate for this patient at this time.  The patient did not have access to video technology/had technical difficulties with video requiring transitioning to audio format only (telephone).  All issues noted in this document were discussed and addressed.  No physical exam could be performed with this format.  Please refer to the patient's chart for her consent to telehealth for [facility].  Evaluation Performed:  Preoperative cardiovascular risk assessment _____________   Date:  12/08/2023   Patient ID:  Kristin Mack, Kristin Mack 1940/06/25, MRN [id number] Patient Location:  Home Provider location:   Office  Primary Care Provider:  Bertha Broad, MD Primary Cardiologist:  Ahmad Alert, MD  Chief Complaint / Patient Profile   83 y.o. y/o female with a h/o CAD, chronic systolic heart failure with improved LV function, hypertension, hyperlipidemia, GERD, T2DM who is pending colonoscopy by Dr. Nickey Barn on 12/17/2023 as well as right total knee arthroplasty by Dr. Bernard Brick on 02/01/2024 and presents today for telephonic preoperative cardiovascular risk assessment.  History of Present Illness    Kristin Mack is a 83 y.o. female who presents via audio/video conferencing for a telehealth visit today.  Pt was last seen in cardiology clinic on 07/27/2023 by Dr. Alroy Aspen.  At that time Kristin Mack was stable from a cardiac standpoint.  The patient is now pending procedure as outlined above. Since her last visit, she is doing well. Patient denies shortness of breath, dyspnea on exertion, lower extremity edema, orthopnea or PND. No chest pain, pressure, or tightness. No palpitations. [facility] in the last few months because of  transportation. Her activity is also limited by knee pain. She has been performing some lower extremity exercises and stretching daily. She is also able to perform light to moderate household activities.   Past Medical History    Past Medical History:  Diagnosis Date   Anxiety    Carpal tunnel syndrome    Left Hand  nueuropath L foot   Chest pain    CHF (congestive heart failure) (HCC)    Dilated cardiomyopathy. Her original ejection fraction was 10-20%. Her ejection fraction has improved to 57% by 2009   Coronary artery disease    Depression    Dyslipidemia    Foot drop, left    from a left hip repacement   GERD (gastroesophageal reflux disease)    Headache(784.0)    Hemorrhoids    History of anxiety    Hypertension    Lower back pain    Myocardial infarction (HCC)    2002   Obesity    Osteoarthritis    Severe with collapse of the left hip   Pneumonia    Single functional kidney    single right kidney,  left kidney is atrophic   Past Surgical History:  Procedure Laterality Date   BREAST BIOPSY Right    x2   CARDIAC CATHETERIZATION     Ejection Fraction was around 45-50%   EYE SURGERY     cataracts   HYSTEROSCOPY  09/03/2011   Procedure: HYSTEROSCOPY;  Surgeon: Adrianna Albee, MD;  Location: [facility];  Service: Gynecology;  Laterality: N/A;  With Resection of polyp with truclear system   REPLACEMENT TOTAL HIP W/  RESURFACING IMPLANTS  2002   left   TOTAL HIP ARTHROPLASTY Right 09/09/2023   Procedure: ARTHROPLASTY, HIP, TOTAL, ANTERIOR APPROACH;  Surgeon: Claiborne Crew, MD;  Location: [facility];  Service: Orthopedics;  Laterality: Right;   TUBAL LIGATION      Allergies  Allergies  Allergen Reactions   Celecoxib Other (See Comments)    May have contributed to congestive heart failure    Rofecoxib     May have contributed to congestive heart failure    Nickel Itching and Rash    Home Medications    Prior to Admission medications   Medication Sig Start Date  End Date Taking? Authorizing Provider  acetaminophen  (TYLENOL ) 500 MG tablet Take 500 mg by mouth every 6 (six) hours as needed for moderate pain (pain score 4-6).    [provider]  amoxicillin  (AMOXIL ) 500 MG capsule Take 4 capsules by mouth as needed. 1 hr prior to dental work 07/31/19   [provider]  Ascorbic Acid (VITAMIN C) 1000 MG tablet Take 1,000 mg by mouth daily.    [provider]  aspirin  81 MG chewable tablet Chew 1 tablet (81 mg total) by mouth 2 (two) times daily for 28 days. 09/10/23 12/03/23  Earnie Gola, PA-C  atorvastatin  (LIPITOR) 40 MG tablet Take 40 mg by mouth daily. 10/29/14   [provider]  Biotin 5000 MCG CAPS Take 5,000 mcg by mouth daily.    [provider]  Calcium  Carb-Cholecalciferol (CALCIUM  600 + D PO) Take 1 tablet by mouth daily.    [provider]  carvedilol  (COREG ) 12.5 MG tablet TAKE 1 TABLET BY MOUTH TWICE DAILY WITH A MEAL 07/16/23   Nahser, Lela Purple, MD  clobetasol cream (TEMOVATE) 0.05 % Apply 1 application  topically every other day. At night 02/15/16   [provider]  denosumab  (PROLIA ) 60 MG/ML SOSY injection Inject 60 mg into the skin every 6 (six) months.    [provider]  doxycycline  (VIBRA -TABS) 100 MG tablet Take 1 tablet (100 mg total) by mouth 2 (two) times daily. 09/16/23   Velma Ghazi, DPM  estradiol  (ESTRACE ) 0.1 MG/GM vaginal cream estradiol  0.01% (0.1 mg/gram) vaginal cream apply locally 3 times per week 12/12/20   Nahser, Lela Purple, MD  ezetimibe  (ZETIA ) 10 MG tablet Take 1 tablet (10 mg total) by mouth daily. 04/21/23   Nahser, Lela Purple, MD  famotidine  (PEPCID ) 20 MG tablet Take 20 mg by mouth 2 (two) times daily.    [provider]  latanoprost  (XALATAN ) 0.005 % ophthalmic solution Place 1 drop into both eyes at bedtime. 02/15/17   [provider]  LORazepam  (ATIVAN ) 0.5 MG tablet Take 0.5 mg by mouth 2 (two) times daily as needed for anxiety.     [provider]  meclizine (ANTIVERT) 12.5 MG tablet TAKE 1 TABLET BY MOUTH EVERY 8 HOURS AS NEEDED FOR DIZZINESS    [provider]  melatonin 5 MG TABS Take 1 tablet by mouth at bedtime. 03/18/11   [provider]  methocarbamol  (ROBAXIN ) 500 MG tablet Take 1 tablet (500 mg total) by mouth every 6 (six) hours as needed for muscle spasms. 09/10/23   Earnie Gola, PA-C  Multiple Vitamins-Minerals (CENTRUM SILVER  ULTRA WOMENS) TABS Take 1 tablet by mouth daily. 03/24/12   [provider]  mupirocin  ointment (BACTROBAN ) 2 % APPLY TOPICALLY TO THE AFFECTED AREA TWICE DAILY Patient taking differently: Apply 1 Application topically daily. 08/12/23   Hyatt, Max T,  DPM  nitroGLYCERIN  (NITROSTAT ) 0.4 MG SL tablet PLACE 1 TABLET BY MOUTH UNDER THE TONGUE AS NEEDED EVERY 5 MINUTES FOR CHEST PAIN 09/06/23   Nahser, Lela Purple, MD  Omega-3 Fatty Acids (FISH OIL) 1200 MG CAPS Take 1,200 mg by mouth daily.    [provider]  oxyCODONE  (OXY IR/ROXICODONE ) 5 MG immediate release tablet Take 1 tablet (5 mg total) by mouth every 4 (four) hours as needed for severe pain (pain score 7-10). 09/10/23   Earnie Gola, PA-C  Polyethyl Glycol-Propyl Glycol (LUBRICATING EYE DROPS OP) Place 1 drop into both eyes daily as needed (dry eyes).    [provider]  polyethylene glycol (MIRALAX  / GLYCOLAX ) 17 g packet Take 17 g by mouth 2 (two) times daily. 09/10/23   Earnie Gola, PA-C  Probiotic Product (PROBIOTIC PO) Take 1 capsule by mouth daily.    [provider]  risperiDONE  (RISPERDAL ) 1 MG tablet Take 1 tablet by mouth at bedtime. 07/19/19   [provider]  sacubitril -valsartan  (ENTRESTO ) 24-26 MG Take 1 tablet by mouth 2 (two) times daily. 11/03/23   Nahser, Lela Purple, MD  Sodium Fluoride (CLINPRO 5000) 1.1 % PSTE Place 1 Application onto teeth at bedtime.    [provider]  tiZANidine (ZANAFLEX) 2 MG tablet Take 2 mg by mouth every 8  (eight) hours.    [provider]  trimethoprim (TRIMPEX) 100 MG tablet Take 100 mg by mouth daily. 04/30/23   [provider]  venlafaxine  XR (EFFEXOR -XR) 75 MG 24 hr capsule Take 3 capsules by mouth daily.    [provider]    Physical Exam    Vital Signs:  Kristin Mack does not have vital signs available for review today.  Given telephonic nature of communication, physical exam is limited. AAOx3. NAD. Normal affect.  Speech and respirations are unlabored.   Assessment & Plan    Primary Cardiologist: Ahmad Alert, MD  Preoperative cardiovascular risk assessment.  Colonoscopy by Dr. Nickey Barn on 12/17/2023.  And right total knee arthroplasty by Dr. Bernard Brick on 02/01/2024.  Chart reviewed as part of pre-operative protocol coverage. According to the RCRI, patient has a 0.9-6% risk of MACE. Patient reports activity equivalent to 4.0 METS (stretching and light to moderate household activities).   Given past medical history and time since last visit, based on ACC/AHA guidelines, Kristin Mack would be at acceptable risk for the planned procedure without further cardiovascular testing.   Patient was advised that if she develops new symptoms prior to surgery to contact our office to arrange a follow-up appointment.  she verbalized understanding.  Ideally aspirin  should be continued without interruption, however if the bleeding risk is too great, aspirin  may be held for 5-7 days prior to surgery. Please resume aspirin  post operatively when it is felt to be safe from a bleeding standpoint.     I will route this recommendation to the requesting party via Epic fax function.  Please call with questions.  Time:   Today, I have spent 7 minutes with the patient with telehealth technology discussing medical history, symptoms, and management plan.     Morey Ar, NP  12/08/2023, 8:10 AM

## 2023-12-09 DIAGNOSIS — F329 Major depressive disorder, single episode, unspecified: Secondary | ICD-10-CM | POA: Diagnosis not present

## 2023-12-10 ENCOUNTER — Other Ambulatory Visit: Payer: Self-pay | Admitting: Gastroenterology

## 2023-12-13 ENCOUNTER — Encounter (HOSPITAL_COMMUNITY): Payer: Self-pay | Admitting: Gastroenterology

## 2023-12-13 NOTE — Telephone Encounter (Signed)
 Completed in separate encounter

## 2023-12-15 DIAGNOSIS — F429 Obsessive-compulsive disorder, unspecified: Secondary | ICD-10-CM | POA: Diagnosis not present

## 2023-12-17 ENCOUNTER — Ambulatory Visit (HOSPITAL_BASED_OUTPATIENT_CLINIC_OR_DEPARTMENT_OTHER): Payer: Self-pay | Admitting: Anesthesiology

## 2023-12-17 ENCOUNTER — Ambulatory Visit (HOSPITAL_COMMUNITY)
Admission: RE | Admit: 2023-12-17 | Discharge: 2023-12-17 | Disposition: A | Discharge status: Home / Self Care | Attending: Gastroenterology | Admitting: Gastroenterology

## 2023-12-17 ENCOUNTER — Other Ambulatory Visit: Payer: Self-pay

## 2023-12-17 ENCOUNTER — Encounter (HOSPITAL_COMMUNITY): Payer: Self-pay | Admitting: Gastroenterology

## 2023-12-17 ENCOUNTER — Encounter (HOSPITAL_COMMUNITY)
Admission: RE | Disposition: A | Discharge status: Home / Self Care | Payer: Self-pay | Source: Home / Self Care | Attending: Gastroenterology

## 2023-12-17 ENCOUNTER — Ambulatory Visit (HOSPITAL_COMMUNITY): Payer: Self-pay | Admitting: Anesthesiology

## 2023-12-17 DIAGNOSIS — Z139 Encounter for screening, unspecified: Secondary | ICD-10-CM | POA: Diagnosis not present

## 2023-12-17 DIAGNOSIS — K635 Polyp of colon: Secondary | ICD-10-CM | POA: Insufficient documentation

## 2023-12-17 DIAGNOSIS — R195 Other fecal abnormalities: Secondary | ICD-10-CM | POA: Diagnosis not present

## 2023-12-17 DIAGNOSIS — Z79899 Other long term (current) drug therapy: Secondary | ICD-10-CM | POA: Diagnosis not present

## 2023-12-17 DIAGNOSIS — I2510 Atherosclerotic heart disease of native coronary artery without angina pectoris: Secondary | ICD-10-CM | POA: Diagnosis not present

## 2023-12-17 DIAGNOSIS — D123 Benign neoplasm of transverse colon: Secondary | ICD-10-CM

## 2023-12-17 DIAGNOSIS — I110 Hypertensive heart disease with heart failure: Secondary | ICD-10-CM | POA: Diagnosis not present

## 2023-12-17 DIAGNOSIS — G709 Myoneural disorder, unspecified: Secondary | ICD-10-CM | POA: Diagnosis not present

## 2023-12-17 DIAGNOSIS — D120 Benign neoplasm of cecum: Secondary | ICD-10-CM | POA: Insufficient documentation

## 2023-12-17 DIAGNOSIS — Z1211 Encounter for screening for malignant neoplasm of colon: Secondary | ICD-10-CM | POA: Diagnosis not present

## 2023-12-17 DIAGNOSIS — E119 Type 2 diabetes mellitus without complications: Secondary | ICD-10-CM | POA: Diagnosis not present

## 2023-12-17 DIAGNOSIS — K219 Gastro-esophageal reflux disease without esophagitis: Secondary | ICD-10-CM | POA: Diagnosis not present

## 2023-12-17 DIAGNOSIS — I252 Old myocardial infarction: Secondary | ICD-10-CM | POA: Diagnosis not present

## 2023-12-17 DIAGNOSIS — I509 Heart failure, unspecified: Secondary | ICD-10-CM | POA: Insufficient documentation

## 2023-12-17 HISTORY — PX: COLONOSCOPY: SHX5424

## 2023-12-17 SURGERY — COLONOSCOPY
Anesthesia: Monitor Anesthesia Care

## 2023-12-17 MED ORDER — PROPOFOL 500 MG/50ML IV EMUL
INTRAVENOUS | Status: DC | PRN
  Administered 2023-12-17: 130 ug/kg/min via INTRAVENOUS

## 2023-12-17 MED ORDER — LIDOCAINE HCL (CARDIAC) PF 100 MG/5ML IV SOSY
PREFILLED_SYRINGE | INTRAVENOUS | Status: DC | PRN
  Administered 2023-12-17: 100 mg via INTRAVENOUS

## 2023-12-17 MED ORDER — SODIUM CHLORIDE 0.9 % IV SOLN: INTRAVENOUS | Status: DC

## 2023-12-17 MED ORDER — PROPOFOL 10 MG/ML IV BOLUS
INTRAVENOUS | Status: DC | PRN
  Administered 2023-12-17: 50 mg via INTRAVENOUS

## 2023-12-17 MED ORDER — SODIUM CHLORIDE 0.9 % IV SOLN: INTRAVENOUS | Status: DC | PRN

## 2023-12-17 MED ORDER — SODIUM CHLORIDE 0.9 % IV SOLN
INTRAVENOUS | Status: AC | PRN
  Administered 2023-12-17: 500 mL via INTRAVENOUS

## 2023-12-17 NOTE — H&P (Signed)
 Kristin Mack HPI: This 83  year old white female presents to the office for colorectal cancer screening. She had a positive Cologuard stool DNA test done in May, 2025. She has been on MiraLAX  for the last 20 years for chronic constipation. She has 1 BM per day. She had rectal bleeding one day following hip replacment surgery in March, 2025. She has had a drop in her hemoglobin from 14.5 g/dL in early March to 88.1 g/dL later in the month. She is taking Famotidine  twice per day for acid reflux with good control. She has a good appetite and her weight has been stable. She denies having any complaints of abdominal pain, nausea, vomiting, dysphagia or odynophagia. She denies having a family history of colon cancer, celiac sprue or IBD. She reports having a normal colonoscopy done 15 years ago in [place].  Past Medical History:  Diagnosis Date   Anxiety    Carpal tunnel syndrome    Left Hand  nueuropath L foot   Chest pain    CHF (congestive heart failure) (HCC)    Dilated cardiomyopathy. Her original ejection fraction was 10-20%. Her ejection fraction has improved to 57% by 2009   Coronary artery disease    Depression    Dyslipidemia    Foot drop, left    from a left hip repacement   GERD (gastroesophageal reflux disease)    Headache(784.0)    Hemorrhoids    History of anxiety    Hypertension    Lower back pain    Myocardial infarction (HCC)    2002   Obesity    Osteoarthritis    Severe with collapse of the left hip   Pneumonia    Single functional kidney    single right kidney,  left kidney is atrophic    Past Surgical History:  Procedure Laterality Date   BREAST BIOPSY Right    x2   CARDIAC CATHETERIZATION     Ejection Fraction was around 45-50%   EYE SURGERY     cataracts   HYSTEROSCOPY  09/03/2011   Procedure: HYSTEROSCOPY;  Surgeon: Nathanel LELON Bunker, MD;  Location: [facility];  Service: Gynecology;  Laterality: N/A;  With Resection of polyp with truclear system    REPLACEMENT TOTAL HIP W/  RESURFACING IMPLANTS  2002   left   TOTAL HIP ARTHROPLASTY Right 09/09/2023   Procedure: ARTHROPLASTY, HIP, TOTAL, ANTERIOR APPROACH;  Surgeon: Ernie Cough, MD;  Location: [facility];  Service: Orthopedics;  Laterality: Right;   TUBAL LIGATION      Family History  Problem Relation Age of Onset   Stroke Mother    Cancer Sister    Stroke Maternal Grandfather    Breast cancer Neg Hx    Sleep apnea Neg Hx     Social History:  reports that she has never smoked. She has never used smokeless tobacco. She reports that she does not drink alcohol  and does not use drugs.  Allergies:  Allergies  Allergen Reactions   Celecoxib Other (See Comments)    May have contributed to congestive heart failure    Rofecoxib     May have contributed to congestive heart failure    Nickel Itching and Rash    Medications: Scheduled: Continuous:  sodium chloride  500 mL (12/17/23 0957)   sodium chloride       No results found for this or any previous visit (from the past 24 hours).   No results found.  ROS:  As stated above in the HPI otherwise  negative.  Blood pressure (!) 125/58, pulse 65, temperature (!) 97.1 F (36.2 C), temperature source Temporal, resp. rate 10, height 5' 6 (1.676 m), weight 88 kg, SpO2 95%.    PE: Gen: NAD, Alert and Oriented HEENT:  [place]/AT, EOMI Neck: Supple, no LAD Lungs: CTA Bilaterally CV: RRR without M/G/R ABD: Soft, NTND, +BS Ext: No C/C/E  Assessment/Plan: 1) Positive Cologuard - colonoscopy.  Kristin Mack D 12/17/2023, 12:06 PM

## 2023-12-17 NOTE — Discharge Instructions (Signed)

## 2023-12-17 NOTE — Op Note (Addendum)
 [facility] Patient Name: Kristin Mack Procedure Date: 12/17/2023 MRN: [id number] Attending MD: Belvie Just , MD, [id number] Date of Birth: 1941-05-12 CSN: [id number] Age: 83 Admit Type: Ambulatory Procedure:                Colonoscopy Indications:              Positive Cologuard test Providers:                Belvie Just, MD, Particia Fischer, RN, Fairy Marina, Technician Referring MD:              Medicines:                Propofol  per Anesthesia Complications:            No immediate complications. Estimated Blood Loss:     Estimated blood loss: none. Procedure:                Pre-Anesthesia Assessment:                           - Prior to the procedure, a History and Physical                            was performed, and patient medications and                            allergies were reviewed. The patient's tolerance of                            previous anesthesia was also reviewed. The risks                            and benefits of the procedure and the sedation                            options and risks were discussed with the patient.                            All questions were answered, and informed consent                            was obtained. Prior Anticoagulants: The patient has                            taken no anticoagulant or antiplatelet agents. ASA                            Grade Assessment: III - A patient with severe                            systemic disease. After reviewing the risks and                            benefits,  the patient was deemed in satisfactory                            condition to undergo the procedure.                           - Sedation was administered by an anesthesia                            professional. Deep sedation was attained.                           After obtaining informed consent, the colonoscope                            was passed under direct vision.  Throughout the                            procedure, the patient's blood pressure, pulse, and                            oxygen saturations were monitored continuously. The                            CF-HQ190L ([id number]) Olympus colonoscope was                            introduced through the anus and advanced to the the                            cecum, identified by appendiceal orifice and                            ileocecal valve. The colonoscopy was performed                            without difficulty. The patient tolerated the                            procedure well. The quality of the bowel                            preparation was evaluated using the BBPS [facility]                            Bowel Preparation Scale) with scores of: Right                            Colon = 3 (entire mucosa seen well with no residual                            staining, small fragments of stool or opaque  liquid), Transverse Colon = 2 (minor amount of                            residual staining, small fragments of stool and/or                            opaque liquid, but mucosa seen well) and Left Colon                            = 2 (minor amount of residual staining, small                            fragments of stool and/or opaque liquid, but mucosa                            seen well). The total BBPS score equals 7. The                            quality of the bowel preparation was good. The                            ileocecal valve, appendiceal orifice, and rectum                            were photographed. Scope In: 12:23:21 PM Scope Out: 12:42:17 PM Scope Withdrawal Time: 0 hours 13 minutes 21 seconds  Total Procedure Duration: 0 hours 18 minutes 56 seconds  Findings:      Five sessile polyps were found in the transverse colon and cecum. The       polyps were 1 to 3 mm in size. These polyps were removed with a cold       snare. Resection and retrieval  were complete. Impression:               - Five 1 to 3 mm polyps in the transverse colon and                            in the cecum, removed with a cold snare. Resected                            and retrieved. Moderate Sedation:      Not Applicable - Patient had care per Anesthesia. Recommendation:           - Patient has a contact number available for                            emergencies. The signs and symptoms of potential                            delayed complications were discussed with the                            patient. Return to normal activities tomorrow.  Written discharge instructions were provided to the                            patient.                           - Resume regular diet.                           - Continue present medications.                           - Await pathology results.                           - Repeat colonoscopy is not recommended for                            surveillance.                           - Consider an EGD for her recent history of anemia.                           - Follow up with Dr. Kristie in one month. Procedure Code(s):        --- Professional ---                           [phone], Colonoscopy, flexible; with removal of                            tumor(s), polyp(s), or other lesion(s) by snare                            technique Diagnosis Code(s):        --- Professional ---                           R19.5, Other fecal abnormalities                           D12.3, Benign neoplasm of transverse colon (hepatic                            flexure or splenic flexure)                           D12.0, Benign neoplasm of cecum CPT copyright 2022 American Medical Association. All rights reserved. The codes documented in this report are preliminary and upon coder review may  be revised to meet current compliance requirements. Belvie Just, MD Belvie Just, MD 12/17/2023 12:49:41 PM This report has been  signed electronically. Number of Addenda: 0

## 2023-12-17 NOTE — Transfer of Care (Addendum)
 Immediate Anesthesia Transfer of Care Note  Patient: Kristin Mack  Procedure(s) Performed: COLONOSCOPY  Patient Location: PACU  Anesthesia Type:MAC  Level of Consciousness: awake, alert , and oriented  Airway & Oxygen Therapy: Patient Spontanous Breathing and Patient connected to nasal cannula oxygen  Post-op Assessment: Report given to RN and Post -op Vital signs reviewed and stable  Post vital signs: Reviewed and stable  Last Vitals:  Vitals Value Taken Time  BP 95/65 1250  Temp 36 1250  Pulse 73 12/17/23 12:50  Resp 18 12/17/23 12:50  SpO2 98 % 12/17/23 12:50  Vitals shown include unfiled device data.  Last Pain:  Vitals:   12/17/23 0943  TempSrc: Temporal  PainSc: 0-No pain         Complications: No notable events documented.

## 2023-12-17 NOTE — Anesthesia Postprocedure Evaluation (Signed)
 Anesthesia Post Note  Patient: Kristin Mack  Procedure(s) Performed: COLONOSCOPY     Patient location during evaluation: PACU Anesthesia Type: MAC Level of consciousness: awake and alert Pain management: pain level controlled Vital Signs Assessment: post-procedure vital signs reviewed and stable Respiratory status: spontaneous breathing, nonlabored ventilation, respiratory function stable and patient connected to nasal cannula oxygen Cardiovascular status: stable and blood pressure returned to baseline Postop Assessment: no apparent nausea or vomiting Anesthetic complications: no   There were no known notable events for this encounter.  Last Vitals:  Vitals:   12/17/23 0943  BP: (!) 125/58  Pulse: 65  Resp: 10  Temp: (!) 36.2 C  SpO2: 95%    Last Pain:  Vitals:   12/17/23 0943  TempSrc: Temporal  PainSc: 0-No pain                 Palmina Clodfelter

## 2023-12-17 NOTE — Anesthesia Preprocedure Evaluation (Addendum)
 Anesthesia Evaluation  Patient identified by MRN, date of birth, ID band Patient awake    Reviewed: Allergy & Precautions, H&P , NPO status , Patient's Chart, lab work & pertinent test results  Airway Mallampati: I  TM Distance: >3 FB Neck ROM: Full    Dental no notable dental hx.    Pulmonary neg pulmonary ROS, pneumonia   Pulmonary exam normal breath sounds clear to auscultation       Cardiovascular hypertension, + CAD, + Past MI and +CHF  negative cardio ROS Normal cardiovascular exam Rhythm:Regular Rate:Normal     Neuro/Psych  Headaches PSYCHIATRIC DISORDERS Anxiety Depression     Neuromuscular disease negative neurological ROS  negative psych ROS   GI/Hepatic negative GI ROS, Neg liver ROS,GERD  ,,  Endo/Other  negative endocrine ROSdiabetes    Renal/GU negative Renal ROS  negative genitourinary   Musculoskeletal negative musculoskeletal ROS (+)    Abdominal   Peds negative pediatric ROS (+)  Hematology negative hematology ROS (+)   Anesthesia Other Findings   Reproductive/Obstetrics negative OB ROS                              Anesthesia Physical Anesthesia Plan  ASA: 2  Anesthesia Plan: MAC   Post-op Pain Management:    Induction: Intravenous  PONV Risk Score and Plan: 2 and Propofol  infusion  Airway Management Planned: Mask and Natural Airway  Additional Equipment: None  Intra-op Plan:   Post-operative Plan:   Informed Consent:   Plan Discussed with: Anesthesiologist  Anesthesia Plan Comments: ( 83 y.o. y/o female with a h/o CAD, chronic systolic heart failure with improved LV function, hypertension, hyperlipidemia, GERD, T2DM who is pending colonoscopy by Dr. Rollin on 12/17/2023 as well as right total knee arthroplasty by Dr. Ernie on 02/01/2024 and presents today for telephonic preoperative cardiovascular risk assessment.   )        Anesthesia Quick  Evaluation

## 2023-12-20 ENCOUNTER — Encounter (HOSPITAL_COMMUNITY): Payer: Self-pay | Admitting: Gastroenterology

## 2023-12-21 LAB — SURGICAL PATHOLOGY

## 2024-01-06 ENCOUNTER — Encounter: Payer: Self-pay | Admitting: Podiatry

## 2024-01-06 ENCOUNTER — Ambulatory Visit: Admitting: Podiatry

## 2024-01-06 DIAGNOSIS — L97521 Non-pressure chronic ulcer of other part of left foot limited to breakdown of skin: Secondary | ICD-10-CM | POA: Diagnosis not present

## 2024-01-06 DIAGNOSIS — E785 Hyperlipidemia, unspecified: Secondary | ICD-10-CM | POA: Diagnosis not present

## 2024-01-06 DIAGNOSIS — Z0000 Encounter for general adult medical examination without abnormal findings: Secondary | ICD-10-CM | POA: Diagnosis not present

## 2024-01-06 DIAGNOSIS — E559 Vitamin D deficiency, unspecified: Secondary | ICD-10-CM | POA: Diagnosis not present

## 2024-01-06 DIAGNOSIS — I10 Essential (primary) hypertension: Secondary | ICD-10-CM | POA: Diagnosis not present

## 2024-01-06 DIAGNOSIS — E11621 Type 2 diabetes mellitus with foot ulcer: Secondary | ICD-10-CM | POA: Diagnosis not present

## 2024-01-06 NOTE — Progress Notes (Signed)
 She presents today for follow-up of her chronic ulceration to the distal medial aspect of her hallux left.  She states that she has been off of her feet a lot more trying to let this heal.  Has not been going to the pool because she has a hard time getting there because of her car.  She states that her toenails are long and painful would like to have them cut if possible.  Objective: Vital signs stable alert oriented x 3 there is no erythema edema salines drainage or odor pulses are palpable.  Reactive hyperkeratotic tissue along the distal medial aspect of the hallux left with severe hallux interphalangeal once debrided does not demonstrate any open wounds at this point.  Otherwise the toenails are long thick yellow dystrophic clinically mycotic painful palpation as well as debridement.  Assessment: Pain in limb secondary to ulcerative lesion and mycotic nails left.  Plan: Debridement of toenails 1 through 5 bilaterally.  Debridement of ulceration.  Follow-up with her in 3 months.  She is having knee surgery August 16.

## 2024-01-12 DIAGNOSIS — Z96643 Presence of artificial hip joint, bilateral: Secondary | ICD-10-CM | POA: Diagnosis not present

## 2024-01-12 DIAGNOSIS — M25661 Stiffness of right knee, not elsewhere classified: Secondary | ICD-10-CM | POA: Diagnosis not present

## 2024-01-12 DIAGNOSIS — G8929 Other chronic pain: Secondary | ICD-10-CM | POA: Diagnosis not present

## 2024-01-12 DIAGNOSIS — F429 Obsessive-compulsive disorder, unspecified: Secondary | ICD-10-CM | POA: Diagnosis not present

## 2024-01-12 DIAGNOSIS — M1711 Unilateral primary osteoarthritis, right knee: Secondary | ICD-10-CM | POA: Diagnosis not present

## 2024-01-12 DIAGNOSIS — M25561 Pain in right knee: Secondary | ICD-10-CM | POA: Diagnosis not present

## 2024-01-18 DIAGNOSIS — M25561 Pain in right knee: Secondary | ICD-10-CM | POA: Diagnosis not present

## 2024-01-18 DIAGNOSIS — Z1331 Encounter for screening for depression: Secondary | ICD-10-CM | POA: Diagnosis not present

## 2024-01-18 DIAGNOSIS — I5042 Chronic combined systolic (congestive) and diastolic (congestive) heart failure: Secondary | ICD-10-CM | POA: Diagnosis not present

## 2024-01-18 DIAGNOSIS — L97522 Non-pressure chronic ulcer of other part of left foot with fat layer exposed: Secondary | ICD-10-CM | POA: Diagnosis not present

## 2024-01-18 DIAGNOSIS — Z0000 Encounter for general adult medical examination without abnormal findings: Secondary | ICD-10-CM | POA: Diagnosis not present

## 2024-01-18 DIAGNOSIS — M21372 Foot drop, left foot: Secondary | ICD-10-CM | POA: Diagnosis not present

## 2024-01-18 DIAGNOSIS — Z1339 Encounter for screening examination for other mental health and behavioral disorders: Secondary | ICD-10-CM | POA: Diagnosis not present

## 2024-01-18 DIAGNOSIS — I739 Peripheral vascular disease, unspecified: Secondary | ICD-10-CM | POA: Diagnosis not present

## 2024-01-18 DIAGNOSIS — I110 Hypertensive heart disease with heart failure: Secondary | ICD-10-CM | POA: Diagnosis not present

## 2024-01-18 DIAGNOSIS — E11621 Type 2 diabetes mellitus with foot ulcer: Secondary | ICD-10-CM | POA: Diagnosis not present

## 2024-01-18 DIAGNOSIS — I2510 Atherosclerotic heart disease of native coronary artery without angina pectoris: Secondary | ICD-10-CM | POA: Diagnosis not present

## 2024-01-20 DIAGNOSIS — E669 Obesity, unspecified: Secondary | ICD-10-CM | POA: Diagnosis not present

## 2024-01-20 DIAGNOSIS — K219 Gastro-esophageal reflux disease without esophagitis: Secondary | ICD-10-CM | POA: Diagnosis not present

## 2024-01-20 DIAGNOSIS — D509 Iron deficiency anemia, unspecified: Secondary | ICD-10-CM | POA: Diagnosis not present

## 2024-01-20 DIAGNOSIS — Z860100 Personal history of colon polyps, unspecified: Secondary | ICD-10-CM | POA: Diagnosis not present

## 2024-01-24 ENCOUNTER — Ambulatory Visit (HOSPITAL_BASED_OUTPATIENT_CLINIC_OR_DEPARTMENT_OTHER): Admitting: Cardiology

## 2024-01-24 ENCOUNTER — Encounter (HOSPITAL_BASED_OUTPATIENT_CLINIC_OR_DEPARTMENT_OTHER): Payer: Self-pay | Admitting: Cardiology

## 2024-01-24 VITALS — BP 82/44 | HR 67 | Ht 66.0 in | Wt 185.8 lb

## 2024-01-24 DIAGNOSIS — I2510 Atherosclerotic heart disease of native coronary artery without angina pectoris: Secondary | ICD-10-CM

## 2024-01-24 DIAGNOSIS — E785 Hyperlipidemia, unspecified: Secondary | ICD-10-CM

## 2024-01-24 DIAGNOSIS — I5042 Chronic combined systolic (congestive) and diastolic (congestive) heart failure: Secondary | ICD-10-CM | POA: Diagnosis not present

## 2024-01-24 DIAGNOSIS — Z01810 Encounter for preprocedural cardiovascular examination: Secondary | ICD-10-CM

## 2024-01-24 DIAGNOSIS — I10 Essential (primary) hypertension: Secondary | ICD-10-CM

## 2024-01-24 NOTE — Patient Instructions (Signed)
 Medication Instructions:  The current medical regimen is effective;  continue present plan and medications.  *If you need a refill on your cardiac medications before your next appointment, please call your pharmacy*  Follow-Up: At [facility], you and your health needs are our priority.  As part of our continuing mission to provide you with exceptional heart care, our providers are all part of one team.  This team includes your primary Cardiologist (physician) and Advanced Practice Providers or APPs (Physician Assistants and Nurse Practitioners) who all work together to provide you with the care you need, when you need it.  Your next appointment:   1 year(s)  Provider:   Oneil Parchment, MD    We recommend signing up for the patient portal called MyChart.  Sign up information is provided on this After Visit Summary.  MyChart is used to connect with patients for Virtual Visits (Telemedicine).  Patients are able to view lab/test results, encounter notes, upcoming appointments, etc.  Non-urgent messages can be sent to your provider as well.   To learn more about what you can do with MyChart, go to [email or web address].    Please pick up a blood pressure monitor at the pharmacy on  the first floor to use to check your blood pressure at home.

## 2024-01-24 NOTE — Progress Notes (Signed)
 Cardiology Office Note:  .   Date:  01/24/2024  ID:  Kristin, Mack 1940-08-26, MRN [id number] PCP: Kristin Mack [name], Kristin Mack   HeartCare Providers Cardiologist:  Kristin Mack, Kristin Mack     History of Present Illness: Kristin Mack is a 83 y.o. female Discussed the use of AI scribe software for clinical note transcription with the patient, who gave verbal consent to proceed.  History of Present Illness Kristin Mack is an 83 year old female with chronic systolic heart failure who presents for cardiovascular follow-up (former Dr. Alveta).  Systolic heart failure - Chronic systolic heart failure with initial ejection fraction of 10-20%, improved to 57% with medical therapy - Currently taking Entresto  - Two prior heart catheterizations, most recent in 2006, showing moderate irregularities - No current chest pain - Experiences stress-related chest discomfort around Christmas  Coronary artery disease and vascular assessment - Coronary calcium  score of 684 in November 2024 (85th percentile) - Vascular ultrasound ABI shows decreased arterial blood flow to the left foot compared to previous results  Electrolyte abnormality and blood pressure - Elevated potassium levels for 8-9 months - Recently started hydrochlorothiazide 12.5 mg daily to address hyperkalemia - Blood pressure this morning was 88/44 mmHg - No dizziness or symptoms of hypotension - Does not currently own a blood pressure monitor  Peripheral ulceration - Chronic ulceration on the distal medial aspect of the left hallux - Ulcer managed by podiatry  Perioperative status and recent procedures - Scheduled for right total knee arthroplasty on February 01, 2024 -  low risk - Colonoscopy performed in June 2025      ROS: No syncope, no dizziness.   Studies Reviewed: .        Results LABS Potassium: 5.2  DIAGNOSTIC Cardiac catheterization: Moderate irregularities Echocardiography: EF 57%  (2009) Coronary calcium  score: 684, 85th percentile (November 2024) Vascular ultrasound ABI: Decreased arterial blood flow to left foot Risk Assessment/Calculations:            Physical Exam:   VS:  BP (!) 82/44 (BP Location: Left Arm, Patient Position: Sitting, Cuff Size: Normal)   Pulse 67   Ht 5' 6 (1.676 m)   Wt 185 lb 12.8 oz (84.3 kg)   SpO2 95%   BMI 29.99 kg/m    Wt Readings from Last 3 Encounters:  01/24/24 185 lb 12.8 oz (84.3 kg)  12/17/23 194 lb (88 kg)  09/09/23 188 lb (85.3 kg)    GEN: Well nourished, well developed in no acute distress NECK: No JVD; No carotid bruits CARDIAC: RRR, no murmurs, no rubs, no gallops RESPIRATORY:  Clear to auscultation without rales, wheezing or rhonchi  ABDOMEN: Soft, non-tender, non-distended EXTREMITIES:  No edema; No deformity knee brace.   ASSESSMENT AND PLAN: .    Assessment and Plan Assessment & Plan Chronic systolic heart failure with improved ejection fraction Chronic systolic heart failure with previously reduced ejection fraction improved to 57% with medical therapy. Well-managed with no symptoms of heart failure. - Continue Entresto  24/26 mg BID as prescribed.  Hypertension/ hypotension Blood pressure is low at 88/44 mmHg, likely due to recent addition of hydrochlorothiazide 12.5 mg daily. No symptoms of dizziness or fainting. Blood pressure management is complicated by hyperkalemia. - Advise home blood pressure monitoring with a new blood pressure cuff. - Instruct to contact primary care physician if blood pressure remains low or symptoms develop after new start HCTZ. - Re-evaluate blood pressure during preoperative assessment for upcoming knee  replacement.  Hyperkalemia Hyperkalemia managed with hydrochlorothiazide 12.5 mg daily, initiated by primary care physician, Dr. Jakie. Potassium levels elevated for 8-9 months. - Monitor potassium levels with upcoming lab work. - Continue hydrochlorothiazide as  prescribed unless advised otherwise by primary care physician.  Coronary artery disease Coronary artery disease with coronary calcium  score of 684 (85th percentile) noted in November 2024. Managed with atorvastatin  40 mg daily to stabilize plaque and control cholesterol. - Continue atorvastatin  40 mg daily. - No symptoms.   Peripheral arterial disease, left foot Peripheral arterial disease with decreased arterial blood flow to the left foot as shown by vascular ultrasound ABI. Referral to vascular specialist initiated. - Follow up with vascular specialist as referred.  Cardiac risk - May proceed with knee replacement with low overall cardiac risk.         Dispo: 1 yr  Signed, Kristin Mack, Kristin Mack

## 2024-01-24 NOTE — Progress Notes (Signed)
 Anesthesia Review:  PCP: Cardiologist :  PPM/ ICD: Device Orders: Rep Notified:  Chest x-ray : EKG : 07/27/23  Echo : 2021  CT Card-05/09/23  Stress test: 2017  Cardiac Cath :   Activity level:  Sleep Study/ CPAP : Fasting Blood Sugar :      / Checks Blood Sugar -- times a day:    Blood Thinner/ Instructions /Last Dose: ASA / Instructions/ Last Dose :    81 mg aspirin 

## 2024-01-24 NOTE — Patient Instructions (Signed)
 SURGICAL WAITING ROOM VISITATION  Patients having surgery or a procedure may have no more than 2 support people in the waiting area - these visitors may rotate.    Children under the age of 50 must have an adult with them who is not the patient.  Visitors with respiratory illnesses are discouraged from visiting and should remain at home.  If the patient needs to stay at the hospital during part of their recovery, the visitor guidelines for inpatient rooms apply. Pre-op nurse will coordinate an appropriate time for 1 support person to accompany patient in pre-op.  This support person may not rotate.    Please refer to the [facility] website for the visitor guidelines for Inpatients (after your surgery is over and you are in a regular room).       Your procedure is scheduled on:  02/01/2024    Report to [facility] Main Entrance    Report to admitting at   [phone]   Call this number if you have problems the morning of surgery [phone]   Do not eat food :After Midnight.   After Midnight you may have the following liquids until _ 0415_____ AM DAY OF SURGERY  Water  Non-Citrus Juices (without pulp, NO RED-Apple, White grape, White cranberry) Black Coffee (NO MILK/CREAM OR CREAMERS, sugar ok)  Clear Tea (NO MILK/CREAM OR CREAMERS, sugar ok) regular and decaf                             Plain Jell-O (NO RED)                                           Fruit ices (not with fruit pulp, NO RED)                                     Popsicles (NO RED)                                                               Sports drinks like Gatorade (NO RED)                    The day of surgery:  Drink ONE (1) Pre-Surgery Clear Ensure or G2 at  0415AM the morning of surgery. Drink in one sitting. Do not sip.  This drink was given to you during your hospital  pre-op appointment visit. Nothing else to drink after completing the  Pre-Surgery Clear Ensure or G2.          If you have  questions, please contact your surgeon's office.      Oral Hygiene is also important to reduce your risk of infection.                                    Remember - BRUSH YOUR TEETH THE MORNING OF SURGERY WITH YOUR REGULAR TOOTHPASTE  DENTURES WILL BE REMOVED PRIOR TO SURGERY PLEASE DO NOT APPLY Poly grip OR ADHESIVES!!!  Do NOT smoke after Midnight   Stop all vitamins and herbal supplements 7 days before surgery.   Take these medicines the morning of surgery with A SIP OF WATER :  coreg , pepcid , effexor    DO NOT TAKE ANY ORAL DIABETIC MEDICATIONS DAY OF YOUR SURGERY  Bring CPAP mask and tubing day of surgery.                              You may not have any metal on your body including hair pins, jewelry, and body piercing             Do not wear make-up, lotions, powders, perfumes/cologne, or deodorant  Do not wear nail polish including gel and S&S, artificial/acrylic nails, or any other type of covering on natural nails including finger and toenails. If you have artificial nails, gel coating, etc. that needs to be removed by a nail salon please have this removed prior to surgery or surgery may need to be canceled/ delayed if the surgeon/ anesthesia feels like they are unable to be safely monitored.   Do not shave  48 hours prior to surgery.               Men may shave face and neck.   Do not bring valuables to the hospital. [facility] IS NOT             RESPONSIBLE   FOR VALUABLES.   Contacts, glasses, dentures or bridgework may not be worn into surgery.   Bring small overnight bag day of surgery.   DO NOT BRING YOUR HOME MEDICATIONS TO THE HOSPITAL. PHARMACY WILL DISPENSE MEDICATIONS LISTED ON YOUR MEDICATION LIST TO YOU DURING YOUR ADMISSION IN THE HOSPITAL!    Patients discharged on the day of surgery will not be allowed to drive home.  Someone NEEDS to stay with you for the first 24 hours after anesthesia.   Special Instructions: Bring a copy of your healthcare  power of attorney and living will documents the day of surgery if you haven't scanned them before.              Please read over the following fact sheets you were given: IF YOU HAVE QUESTIONS ABOUT YOUR PRE-OP INSTRUCTIONS PLEASE CALL [phone].   If you received a COVID test during your pre-op visit  it is requested that you wear a mask when out in public, stay away from anyone that may not be feeling well and notify your surgeon if you develop symptoms. If you test positive for Covid or have been in contact with anyone that has tested positive in the last 10 days please notify you surgeon.      Pre-operative 5 CHG Bath Instructions   You can play a key role in reducing the risk of infection after surgery. Your skin needs to be as free of germs as possible. You can reduce the number of germs on your skin by washing with CHG (chlorhexidine  gluconate) soap before surgery. CHG is an antiseptic soap that kills germs and continues to kill germs even after washing.   DO NOT use if you have an allergy to chlorhexidine /CHG or antibacterial soaps. If your skin becomes reddened or irritated, stop using the CHG and notify one of our RNs at [phone].   Please shower with the CHG soap starting 4 days before surgery using the following schedule:     Please keep in mind the following:  DO NOT shave, including legs and underarms, starting the day of your first shower.   You may shave your face at any point before/day of surgery.  Place clean sheets on your bed the day you start using CHG soap. Use a clean washcloth (not used since being washed) for each shower. DO NOT sleep with pets once you start using the CHG.   CHG Shower Instructions:  If you choose to wash your hair and private area, wash first with your normal shampoo/soap.  After you use shampoo/soap, rinse your hair and body thoroughly to remove shampoo/soap residue.  Turn the water  OFF and apply about 3 tablespoons (45 ml) of CHG soap to  a CLEAN washcloth.  Apply CHG soap ONLY FROM YOUR NECK DOWN TO YOUR TOES (washing for 3-5 minutes)  DO NOT use CHG soap on face, private areas, open wounds, or sores.  Pay special attention to the area where your surgery is being performed.  If you are having back surgery, having someone wash your back for you may be helpful. Wait 2 minutes after CHG soap is applied, then you may rinse off the CHG soap.  Pat dry with a clean towel  Put on clean clothes/pajamas   If you choose to wear lotion, please use ONLY the CHG-compatible lotions on the back of this paper.     Additional instructions for the day of surgery: DO NOT APPLY any lotions, deodorants, cologne, or perfumes.   Put on clean/comfortable clothes.  Brush your teeth.  Ask your nurse before applying any prescription medications to the skin.      CHG Compatible Lotions   Aveeno Moisturizing lotion  Cetaphil Moisturizing Cream  Cetaphil Moisturizing Lotion  Clairol Herbal Essence Moisturizing Lotion, Dry Skin  Clairol Herbal Essence Moisturizing Lotion, Extra Dry Skin  Clairol Herbal Essence Moisturizing Lotion, Normal Skin  Curel Age Defying Therapeutic Moisturizing Lotion with Alpha Hydroxy  Curel Extreme Care Body Lotion  Curel Soothing Hands Moisturizing Hand Lotion  Curel Therapeutic Moisturizing Cream, Fragrance-Free  Curel Therapeutic Moisturizing Lotion, Fragrance-Free  Curel Therapeutic Moisturizing Lotion, Original Formula  Eucerin Daily Replenishing Lotion  Eucerin Dry Skin Therapy Plus Alpha Hydroxy Crme  Eucerin Dry Skin Therapy Plus Alpha Hydroxy Lotion  Eucerin Original Crme  Eucerin Original Lotion  Eucerin Plus Crme Eucerin Plus Lotion  Eucerin TriLipid Replenishing Lotion  Keri Anti-Bacterial Hand Lotion  Keri Deep Conditioning Original Lotion Dry Skin Formula Softly Scented  Keri Deep Conditioning Original Lotion, Fragrance Free Sensitive Skin Formula  Keri Lotion Fast Absorbing Fragrance Free  Sensitive Skin Formula  Keri Lotion Fast Absorbing Softly Scented Dry Skin Formula  Keri Original Lotion  Keri Skin Renewal Lotion Keri Silky Smooth Lotion  Keri Silky Smooth Sensitive Skin Lotion  Nivea Body Creamy Conditioning Oil  Nivea Body Extra Enriched Teacher, adult education Moisturizing Lotion Nivea Crme  Nivea Skin Firming Lotion  NutraDerm 30 Skin Lotion  NutraDerm Skin Lotion  NutraDerm Therapeutic Skin Cream  NutraDerm Therapeutic Skin Lotion  ProShield Protective Hand Cream  Provon moisturizing lotion

## 2024-01-25 NOTE — H&P (Signed)
 TOTAL KNEE ADMISSION H&P  Patient is being admitted for right total knee arthroplasty.  Therapy Plans: outpatient therapy at [facility] Disposition: Home with son Planned DVT Prophylaxis: aspirin  81mg  BID DME needed: none PCP: Dr. Yolande - had appointment this week, awaiting form Cardio: Dr. Alveta - clearance received TXA: Allergies: NSAIDs, nickel - rash / swelling, moxifloxacin Anesthesia Concerns: none BMI: 29.7 Last HgbA1c: none  Other: - ice machine at hospital - No hx of VTE or cancer - Foot drop on the left related to her left THA - with use of AFO - No hx of VTE or cancer - NO NSAIDS** cardiac hx - oxycodone , robaxin , tylenol   Subjective:  Chief Complaint:right knee pain.  HPI: Kristin Mack, 83 y.o. female, has a history of pain and functional disability in the right knee due to arthritis and has failed non-surgical conservative treatments for greater than 12 weeks to includeNSAID's and/or analgesics, corticosteriod injections, and activity modification.  Onset of symptoms was gradual, starting 2 years ago with gradually worsening course since that time. The patient noted no past surgery on the right knee(s).  Patient currently rates pain in the right knee(s) at 8 out of 10 with activity. Patient has worsening of pain with activity and weight bearing, pain that interferes with activities of daily living, and pain with passive range of motion.  Patient has evidence of joint space narrowing by imaging studies. There is no active infection.  Patient Active Problem List   Diagnosis Date Noted   Status post total replacement of right hip 09/09/2023   S/P total right hip arthroplasty 09/09/2023   Inflammatory pain 09/02/2022   Pain in right hand 03/25/2022   Numbness of right hand 12/04/2021   Right hand weakness 12/04/2021   Bilateral hearing loss 11/04/2021   Hx of compression fracture of spine 08/20/2021   Senile osteoporosis 08/20/2021   Osteoarthritis of knee  08/06/2021   Unsteady gait 08/06/2021   At high risk for falls 07/24/2021   Low bone mass 07/24/2021   Pain in right knee 10/12/2019   Non-pressure chronic ulcer of other part of left foot with fat layer exposed (HCC) 11/03/2017   Encounter for general adult medical examination without abnormal findings 05/11/2017   Acute bronchitis 05/05/2017   Injury 08/21/2016   Complete tear of right rotator cuff 06/18/2016   Localized, primary osteoarthritis of shoulder region, right 06/18/2016   Coronary artery disease involving native coronary artery of native heart without angina pectoris 05/27/2016   Neck pain 02/19/2016   Other slipping, tripping and stumbling without falling, initial encounter 02/19/2016   Pain in thoracic spine 02/19/2016   Posttraumatic headache 02/19/2016   Height loss 01/09/2016   History of total left hip arthroplasty 01/09/2016   Left foot drop 01/09/2016   Postmenopausal status 01/09/2016   Hypokalemia 03/30/2013   Abscess of axilla, right 08/31/2011   Cough 07/29/2011   Sinusitis 07/29/2011   Acquired hallux valgus 05/11/2011   Type 2 diabetes mellitus with foot ulcer (CODE) (HCC) 05/11/2011   Microscopic hematuria 03/18/2011   Psychophysiologic insomnia 03/18/2011   Chronic systolic CHF (congestive heart failure) (HCC) 03/11/2011   Hyperlipidemia 03/11/2011   Carpal tunnel syndrome    GERD (gastroesophageal reflux disease)    Other specified abnormal findings of blood chemistry 11/22/2009   Vitamin D  deficiency 11/22/2009   Rash and other nonspecific skin eruption 11/14/2009   Atrophic vaginitis 11/12/2009   Depressive disorder, not elsewhere classified 11/12/2009   Onychomycosis due to dermatophyte 11/12/2009  Arthropathy 11/07/2009   Congestive heart failure (HCC) 11/07/2009   Low back pain 11/07/2009   Other acquired deformity of ankle and foot(736.79) 11/07/2009   Anxiety disorder 09/20/2009   Cardiomyopathy (HCC) 09/20/2009   Essential  hypertension 09/20/2009   Nocturia 09/20/2009   Abnormal weight gain 09/20/2009   Pain in left hip 09/20/2009   Past Medical History:  Diagnosis Date   Anxiety    Carpal tunnel syndrome    Left Hand  nueuropath L foot   Chest pain    CHF (congestive heart failure) (HCC)    Dilated cardiomyopathy. Her original ejection fraction was 10-20%. Her ejection fraction has improved to 57% by 2009   Coronary artery disease    Depression    Dyslipidemia    Foot drop, left    from a left hip repacement   GERD (gastroesophageal reflux disease)    Headache(784.0)    Hemorrhoids    History of anxiety    Hypertension    Lower back pain    Myocardial infarction (HCC)    2002   Obesity    Osteoarthritis    Severe with collapse of the left hip   Pneumonia    Single functional kidney    single right kidney,  left kidney is atrophic    Past Surgical History:  Procedure Laterality Date   BREAST BIOPSY Right    x2   CARDIAC CATHETERIZATION     Ejection Fraction was around 45-50%   COLONOSCOPY N/A 12/17/2023   Procedure: COLONOSCOPY;  Surgeon: Rollin Dover, MD;  Location: [facility];  Service: Gastroenterology;  Laterality: N/A;   EYE SURGERY     cataracts   HYSTEROSCOPY  09/03/2011   Procedure: HYSTEROSCOPY;  Surgeon: Nathanel LELON Bunker, MD;  Location: [facility];  Service: Gynecology;  Laterality: N/A;  With Resection of polyp with truclear system   REPLACEMENT TOTAL HIP W/  RESURFACING IMPLANTS  2002   left   TOTAL HIP ARTHROPLASTY Right 09/09/2023   Procedure: ARTHROPLASTY, HIP, TOTAL, ANTERIOR APPROACH;  Surgeon: Ernie Cough, MD;  Location: [facility];  Service: Orthopedics;  Laterality: Right;   TUBAL LIGATION      No current facility-administered medications for this encounter.   Current Outpatient Medications  Medication Sig Dispense Refill Last Dose/Taking   acetaminophen  (TYLENOL ) 500 MG tablet Take 500 mg by mouth every 6 (six) hours as needed for moderate pain (pain score 4-6).  (Patient not taking: Reported on 01/24/2024)   Taking As Needed   amoxicillin  (AMOXIL ) 500 MG capsule Take 4 capsules by mouth as needed. 1 hr prior to dental work (Patient not taking: Reported on 01/24/2024)   Taking As Needed   Ascorbic Acid (VITAMIN C) 1000 MG tablet Take 1,000 mg by mouth daily.   Taking   aspirin  EC 81 MG tablet Take 81 mg by mouth daily. Swallow whole.   Taking   atorvastatin  (LIPITOR) 40 MG tablet Take 40 mg by mouth daily.  1 Taking   Biotin 5000 MCG CAPS Take 5,000 mcg by mouth daily.   Taking   Calcium  Carb-Cholecalciferol (CALCIUM  600 + D PO) Take 1 tablet by mouth daily.   Taking   carvedilol  (COREG ) 12.5 MG tablet TAKE 1 TABLET BY MOUTH TWICE DAILY WITH A MEAL 180 tablet 2 Taking   Cholecalciferol (VITAMIN D3) 50 MCG (2000 UT) capsule Take 2,000 Units by mouth daily.   Taking   clobetasol cream (TEMOVATE) 0.05 % Apply 1 application  topically 2 (two) times daily.  Taking   denosumab  (PROLIA ) 60 MG/ML SOSY injection Inject 60 mg into the skin every 6 (six) months.   Taking   estradiol  (ESTRACE ) 0.1 MG/GM vaginal cream Place 1 Applicatorful vaginally 3 (three) times a week. estradiol  0.01% (0.1 mg/gram) vaginal cream apply locally 3 times per week 42.5 g 0 Taking   ezetimibe  (ZETIA ) 10 MG tablet Take 1 tablet (10 mg total) by mouth daily. 90 tablet 3 Taking   famotidine  (PEPCID ) 20 MG tablet Take 20 mg by mouth 2 (two) times daily.   Taking   hydrochlorothiazide (HYDRODIURIL) 12.5 MG tablet Take 12.5 mg by mouth every morning.   Taking   latanoprost  (XALATAN ) 0.005 % ophthalmic solution Place 1 drop into both eyes at bedtime.  0 Taking   LORazepam  (ATIVAN ) 0.5 MG tablet Take 0.5 mg by mouth at bedtime as needed for anxiety.   Taking As Needed   melatonin 5 MG TABS Take 5 mg by mouth at bedtime.   Taking   methocarbamol  (ROBAXIN ) 500 MG tablet Take 1 tablet (500 mg total) by mouth every 6 (six) hours as needed for muscle spasms. 40 tablet 2 Taking As Needed   Multiple  Vitamins-Minerals (CENTRUM SILVER  ULTRA WOMENS) TABS Take 1 tablet by mouth daily.   Taking   nitroGLYCERIN  (NITROSTAT ) 0.4 MG SL tablet PLACE 1 TABLET BY MOUTH UNDER THE TONGUE AS NEEDED EVERY 5 MINUTES FOR CHEST PAIN (Patient not taking: Reported on 01/24/2024) 25 tablet 11 Taking   Omega-3 Fatty Acids (FISH OIL) 1200 MG CAPS Take 1,200 mg by mouth daily.   Taking   Polyethyl Glycol-Propyl Glycol (LUBRICATING EYE DROPS OP) Place 1 drop into both eyes daily as needed (dry eyes).   Taking As Needed   polyethylene glycol (MIRALAX  / GLYCOLAX ) 17 g packet Take 17 g by mouth 2 (two) times daily. (Patient taking differently: Take 17 g by mouth daily.) 14 each 0 Taking Differently   Probiotic Product (PROBIOTIC PO) Take 1 capsule by mouth daily.   Taking   risperiDONE  (RISPERDAL ) 1 MG tablet Take 1 mg by mouth at bedtime.   Taking   sacubitril -valsartan  (ENTRESTO ) 24-26 MG Take 1 tablet by mouth 2 (two) times daily. 180 tablet 2 Taking   Sodium Fluoride (CLINPRO 5000) 1.1 % PSTE Place 1 Application onto teeth at bedtime.   Taking   trimethoprim (TRIMPEX) 100 MG tablet Take 100 mg by mouth daily.   Taking   venlafaxine  XR (EFFEXOR -XR) 75 MG 24 hr capsule Take 225 mg by mouth daily.   Taking   mupirocin  ointment (BACTROBAN ) 2 % APPLY TOPICALLY TO THE AFFECTED AREA TWICE DAILY 22 g 0 Not Taking   oxyCODONE  (OXY IR/ROXICODONE ) 5 MG immediate release tablet Take 1 tablet (5 mg total) by mouth every 4 (four) hours as needed for severe pain (pain score 7-10). (Patient not taking: Reported on 01/24/2024) 42 tablet 0 Not Taking   Allergies  Allergen Reactions   Celecoxib Other (See Comments)    May have contributed to congestive heart failure  Other Reaction(s): Unknown   Rofecoxib     May have contributed to congestive heart failure  Other Reaction(s): Unknown   Nickel Itching and Rash    Social History   Tobacco Use   Smoking status: Never   Smokeless tobacco: Never  Substance Use Topics   Alcohol   use: No    Alcohol /week: 0.0 standard drinks of alcohol     Family History  Problem Relation Age of Onset   Stroke Mother  Cancer Sister    Stroke Maternal Grandfather    Breast cancer Neg Hx    Sleep apnea Neg Hx      Review of Systems  Constitutional:  Negative for chills and fever.  Respiratory:  Negative for cough and shortness of breath.   Cardiovascular:  Negative for chest pain.  Gastrointestinal:  Negative for nausea and vomiting.  Musculoskeletal:  Positive for arthralgias.     Objective:  Physical Exam Well nourished and well developed. General: Alert and oriented x3, cooperative and pleasant, no acute distress.  Musculoskeletal: Right knee exam: No palpable effusion, warmth erythema 5 degree flexion contracture with flexion to 110 degrees Tenderness over the medial and anterior aspect knee with crepitation noted  Vital signs in last 24 hours:    Labs:   Estimated body mass index is 29.99 kg/m as calculated from the following:   Height as of 01/24/24: 5' 6 (1.676 m).   Weight as of 01/24/24: 84.3 kg.   Imaging Review Plain radiographs demonstrate severe degenerative joint disease of the right knee(s). The overall alignment isneutral. The bone quality appears to be adequate for age and reported activity level.      Assessment/Plan:  End stage arthritis, right knee   The patient history, physical examination, clinical judgment of the provider and imaging studies are consistent with end stage degenerative joint disease of the right knee(s) and total knee arthroplasty is deemed medically necessary. The treatment options including medical management, injection therapy arthroscopy and arthroplasty were discussed at length. The risks and benefits of total knee arthroplasty were presented and reviewed. The risks due to aseptic loosening, infection, stiffness, patella tracking problems, thromboembolic complications and other imponderables were discussed. The  patient acknowledged the explanation, agreed to proceed with the plan and consent was signed. Patient is being admitted for inpatient treatment for surgery, pain control, PT, OT, prophylactic antibiotics, VTE prophylaxis, progressive ambulation and ADL's and discharge planning. The patient is planning to be discharged home.     Patient's anticipated LOS is less than 2 midnights, meeting these requirements: - Younger than 65 - Lives within 1 hour of care - Has a competent adult at home to recover with post-op recover - NO history of  - Chronic pain requiring opiods  - Diabetes  - Coronary Artery Disease  - Heart failure  - Heart attack  - Stroke  - DVT/VTE  - Cardiac arrhythmia  - Respiratory Failure/COPD  - Renal failure  - Anemia  - Advanced Liver disease  [name], PA-C Orthopedic Surgery [facility] [place] [phone]

## 2024-01-26 ENCOUNTER — Encounter (HOSPITAL_COMMUNITY)
Admission: RE | Admit: 2024-01-26 | Discharge: 2024-01-26 | Disposition: A | Discharge status: Home / Self Care | Source: Ambulatory Visit | Attending: Orthopedic Surgery | Admitting: Orthopedic Surgery

## 2024-01-26 ENCOUNTER — Encounter (HOSPITAL_COMMUNITY): Payer: Self-pay

## 2024-01-26 ENCOUNTER — Other Ambulatory Visit: Payer: Self-pay

## 2024-01-26 VITALS — BP 102/63 | HR 67 | Temp 98.4°F | Resp 16 | Ht 66.0 in | Wt 184.0 lb

## 2024-01-26 DIAGNOSIS — Z01812 Encounter for preprocedural laboratory examination: Secondary | ICD-10-CM | POA: Insufficient documentation

## 2024-01-26 DIAGNOSIS — E875 Hyperkalemia: Secondary | ICD-10-CM | POA: Insufficient documentation

## 2024-01-26 DIAGNOSIS — F429 Obsessive-compulsive disorder, unspecified: Secondary | ICD-10-CM | POA: Diagnosis not present

## 2024-01-26 DIAGNOSIS — I5022 Chronic systolic (congestive) heart failure: Secondary | ICD-10-CM | POA: Insufficient documentation

## 2024-01-26 DIAGNOSIS — I2510 Atherosclerotic heart disease of native coronary artery without angina pectoris: Secondary | ICD-10-CM | POA: Insufficient documentation

## 2024-01-26 DIAGNOSIS — Z01818 Encounter for other preprocedural examination: Secondary | ICD-10-CM

## 2024-01-26 DIAGNOSIS — M1711 Unilateral primary osteoarthritis, right knee: Secondary | ICD-10-CM | POA: Insufficient documentation

## 2024-01-26 DIAGNOSIS — I110 Hypertensive heart disease with heart failure: Secondary | ICD-10-CM | POA: Insufficient documentation

## 2024-01-26 LAB — BASIC METABOLIC PANEL WITH GFR
Anion gap: 8 (ref 5–15)
BUN: 19 mg/dL (ref 8–23)
CO2: 29 mmol/L (ref 22–32)
Calcium: 9.1 mg/dL (ref 8.9–10.3)
Chloride: 100 mmol/L (ref 98–111)
Creatinine, Ser: 0.72 mg/dL (ref 0.44–1.00)
GFR, Estimated: >60 mL/min (ref >60)
Glucose, Bld: 109 mg/dL — ABNORMAL HIGH (ref 70–99)
Potassium: 4.6 mmol/L (ref 3.5–5.1)
Sodium: 137 mmol/L (ref 135–145)

## 2024-01-26 LAB — SURGICAL PCR SCREEN
MRSA, PCR: NEGATIVE
Staphylococcus aureus: NEGATIVE

## 2024-01-26 LAB — CBC
HCT: 42.5 % (ref 36.0–46.0)
Hemoglobin: 15 g/dL (ref 12.0–15.0)
MCH: 33 pg (ref 26.0–34.0)
MCHC: 35.3 g/dL (ref 30.0–36.0)
MCV: 93.4 fL (ref 80.0–100.0)
Platelets: 203 K/uL (ref 150–400)
RBC: 4.55 MIL/uL (ref 3.87–5.11)
RDW: 13.9 % (ref 11.5–15.5)
WBC: 9 K/uL (ref 4.0–10.5)
nRBC: 0 % (ref 0.0–0.2)

## 2024-01-27 NOTE — Progress Notes (Signed)
 Anesthesia Chart Review   Case: [id number] Date/Time: 02/01/24 0700   Procedure: ARTHROPLASTY, KNEE, TOTAL (Right: Knee)   Anesthesia type: Spinal   Diagnosis: Primary osteoarthritis of right knee [M17.11]   Pre-op diagnosis: Right knee osteoarthritis   Location: [facility] 09 / [facility]   Surgeons: Ernie Cough, MD       DISCUSSION:83 y.o. never smoker with h/o HTN, CAD, CHF, single functional kidney, right knee OA scheduled for above procedure 02/01/24 with Dr. Cough Ernie.   Pt follows with cardiology for chronic systolic heart failure, CAD with coronary calcium  score of 684 (85th percentile). Pt last seen 01/24/2024. BP at this visit 88/44, hypotension likely due to addition of hydrochlorothiazide.  Pt asymptomatic. Hydrochlorothiazide was added recently by PCP due to hyperkalemia. She was advised to remain on hydrochlorothiazide and monitor BP.  Per notes, May proceed with knee replacement with low overall cardiac risk.  BP 102/63 at PAT visit.  Pt will hold hydrochlorothiazide DOS. Evaluate DOS.   BP Readings from Last 3 Encounters:  01/26/24 102/63  01/24/24 (!) 82/44  12/17/23 (!) 120/50    S/p right total hip 09/09/2023 with no anesthesia complications noted.   VS: BP 102/63   Pulse 67   Temp 36.9 C (Oral)   Resp 16   Ht 5' 6 (1.676 m)   Wt 83.5 kg   SpO2 99%   BMI 29.70 kg/m   PROVIDERS: Yolande Toribio [name], MD is PCP   Jeffrie Anes, MD is Cardiologist LABS: Labs reviewed: Acceptable for surgery. (all labs ordered are listed, but only abnormal results are displayed)  Labs Reviewed  BASIC METABOLIC PANEL WITH GFR - Abnormal; Notable for the following components:      Result Value   Glucose, Bld 109 (*)    All other components within normal limits  SURGICAL PCR SCREEN  CBC     IMAGES:   EKG:   CV: Echo 12/13/2019  1. Left ventricular ejection fraction, by estimation, is 45 to 50%. The  left ventricle has mildly decreased function. Left ventricular  endocardial  border not optimally defined to evaluate regional wall motion. Left  ventricular diastolic parameters are  consistent with Grade I diastolic dysfunction (impaired relaxation).   2. Right ventricular systolic function is normal. The right ventricular  size is normal. Tricuspid regurgitation signal is inadequate for assessing  PA pressure.   3. The mitral valve is normal in structure. Trivial mitral valve  regurgitation. No evidence of mitral stenosis.   4. The aortic valve was not well visualized. Aortic valve regurgitation  is trivial. Mild aortic valve sclerosis is present, with no evidence of  aortic valve stenosis.   5. The inferior vena cava is normal in size with greater than 50%  respiratory variability, suggesting right atrial pressure of 3 mmHg.   Myocardial Perfusion 06/03/2016 Nuclear stress EF: 48%. Findings consistent with prior myocardial infarction. This is a low risk study. The left ventricular ejection fraction is mildly decreased (45-54%). There was no ST segment deviation noted during stress.   Possible small apical infarct no ischemia  EF estimated at 48 % with apical hypokinesis  Past Medical History:  Diagnosis Date   Anxiety    Carpal tunnel syndrome    Left Hand  nueuropath L foot   Chest pain    CHF (congestive heart failure) (HCC)    Dilated cardiomyopathy. Her original ejection fraction was 10-20%. Her ejection fraction has improved to 57% by 2009   Coronary artery disease  Depression    Dyslipidemia    Foot drop, left    from a left hip repacement   GERD (gastroesophageal reflux disease)    Hemorrhoids    History of anxiety    Hypertension    Lower back pain    Myocardial infarction (HCC)    2002   Obesity    Osteoarthritis    Severe with collapse of the left hip   Pneumonia    Single functional kidney    single right kidney,  left kidney is atrophic    Past Surgical History:  Procedure Laterality Date   BREAST BIOPSY  Right    x2   CARDIAC CATHETERIZATION     Ejection Fraction was around 45-50%   COLONOSCOPY N/A 12/17/2023   Procedure: COLONOSCOPY;  Surgeon: Rollin Dover, MD;  Location: [facility];  Service: Gastroenterology;  Laterality: N/A;   EYE SURGERY     cataracts   HYSTEROSCOPY  09/03/2011   Procedure: HYSTEROSCOPY;  Surgeon: Nathanel LELON Bunker, MD;  Location: [facility];  Service: Gynecology;  Laterality: N/A;  With Resection of polyp with truclear system   REPLACEMENT TOTAL HIP W/  RESURFACING IMPLANTS  2002   left   TOTAL HIP ARTHROPLASTY Right 09/09/2023   Procedure: ARTHROPLASTY, HIP, TOTAL, ANTERIOR APPROACH;  Surgeon: Ernie Cough, MD;  Location: [facility];  Service: Orthopedics;  Laterality: Right;   TUBAL LIGATION      MEDICATIONS:  acetaminophen  (TYLENOL ) 500 MG tablet   amoxicillin  (AMOXIL ) 500 MG capsule   Ascorbic Acid (VITAMIN C) 1000 MG tablet   aspirin  EC 81 MG tablet   atorvastatin  (LIPITOR) 40 MG tablet   Biotin 5000 MCG CAPS   Calcium  Carb-Cholecalciferol (CALCIUM  600 + D PO)   carvedilol  (COREG ) 12.5 MG tablet   Cholecalciferol (VITAMIN D3) 50 MCG (2000 UT) capsule   clobetasol cream (TEMOVATE) 0.05 %   denosumab  (PROLIA ) 60 MG/ML SOSY injection   estradiol  (ESTRACE ) 0.1 MG/GM vaginal cream   ezetimibe  (ZETIA ) 10 MG tablet   famotidine  (PEPCID ) 20 MG tablet   latanoprost  (XALATAN ) 0.005 % ophthalmic solution   LORazepam  (ATIVAN ) 0.5 MG tablet   melatonin 5 MG TABS   methocarbamol  (ROBAXIN ) 500 MG tablet   Multiple Vitamins-Minerals (CENTRUM SILVER  ULTRA WOMENS) TABS   mupirocin  ointment (BACTROBAN ) 2 %   nitroGLYCERIN  (NITROSTAT ) 0.4 MG SL tablet   Omega-3 Fatty Acids (FISH OIL) 1200 MG CAPS   oxyCODONE  (OXY IR/ROXICODONE ) 5 MG immediate release tablet   Polyethyl Glycol-Propyl Glycol (LUBRICATING EYE DROPS OP)   polyethylene glycol (MIRALAX  / GLYCOLAX ) 17 g packet   Probiotic Product (PROBIOTIC PO)   risperiDONE  (RISPERDAL ) 1 MG tablet   sacubitril -valsartan   (ENTRESTO ) 24-26 MG   Sodium Fluoride (CLINPRO 5000) 1.1 % PSTE   trimethoprim (TRIMPEX) 100 MG tablet   venlafaxine  XR (EFFEXOR -XR) 75 MG 24 hr capsule   No current facility-administered medications for this encounter.     Harlene Hoots Ward, PA-C [facility] [phone]

## 2024-01-27 NOTE — Anesthesia Preprocedure Evaluation (Addendum)
 Anesthesia Evaluation  Patient identified by MRN, date of birth, ID band Patient awake    Reviewed: Allergy & Precautions, NPO status , Patient's Chart, lab work & pertinent test results  Airway Mallampati: II  TM Distance: >3 FB Neck ROM: Full    Dental  (+) Dental Advisory Given   Pulmonary neg pulmonary ROS   breath sounds clear to auscultation       Cardiovascular hypertension, Pt. on medications and Pt. on home beta blockers + CAD, + Past MI and +CHF   Rhythm:Regular Rate:Normal     Neuro/Psych  Headaches    GI/Hepatic Neg liver ROS,GERD  ,,  Endo/Other  diabetes, Type 2    Renal/GU      Musculoskeletal  (+) Arthritis ,    Abdominal   Peds  Hematology negative hematology ROS (+)   Anesthesia Other Findings   Reproductive/Obstetrics                              Anesthesia Physical Anesthesia Plan  ASA: 3  Anesthesia Plan: Spinal   Post-op Pain Management: Regional block* and Ofirmev  IV (intra-op)*   Induction:   PONV Risk Score and Plan: 2 and Dexamethasone , Ondansetron , Propofol  infusion and Treatment may vary due to age or medical condition  Airway Management Planned: Natural Airway and Simple Face Mask  Additional Equipment:   Intra-op Plan:   Post-operative Plan:   Informed Consent: I have reviewed the patients History and Physical, chart, labs and discussed the procedure including the risks, benefits and alternatives for the proposed anesthesia with the patient or authorized representative who has indicated his/her understanding and acceptance.       Plan Discussed with:   Anesthesia Plan Comments:          Anesthesia Quick Evaluation

## 2024-02-01 ENCOUNTER — Other Ambulatory Visit: Payer: Self-pay

## 2024-02-01 ENCOUNTER — Observation Stay (HOSPITAL_COMMUNITY)
Admission: RE | Admit: 2024-02-01 | Discharge: 2024-02-02 | Disposition: A | Discharge status: Home / Self Care | Attending: Orthopedic Surgery | Admitting: Orthopedic Surgery

## 2024-02-01 ENCOUNTER — Ambulatory Visit (HOSPITAL_BASED_OUTPATIENT_CLINIC_OR_DEPARTMENT_OTHER): Admitting: Certified Registered Nurse Anesthetist

## 2024-02-01 ENCOUNTER — Ambulatory Visit (HOSPITAL_COMMUNITY): Payer: Self-pay | Admitting: Physician Assistant

## 2024-02-01 ENCOUNTER — Encounter (HOSPITAL_COMMUNITY)
Admission: RE | Disposition: A | Discharge status: Home / Self Care | Payer: Self-pay | Source: Home / Self Care | Attending: Orthopedic Surgery

## 2024-02-01 ENCOUNTER — Encounter (HOSPITAL_COMMUNITY): Payer: Self-pay | Admitting: Orthopedic Surgery

## 2024-02-01 DIAGNOSIS — I2510 Atherosclerotic heart disease of native coronary artery without angina pectoris: Secondary | ICD-10-CM

## 2024-02-01 DIAGNOSIS — Z96651 Presence of right artificial knee joint: Secondary | ICD-10-CM | POA: Diagnosis not present

## 2024-02-01 DIAGNOSIS — M1711 Unilateral primary osteoarthritis, right knee: Secondary | ICD-10-CM | POA: Diagnosis not present

## 2024-02-01 DIAGNOSIS — I110 Hypertensive heart disease with heart failure: Secondary | ICD-10-CM | POA: Diagnosis not present

## 2024-02-01 DIAGNOSIS — Z7982 Long term (current) use of aspirin: Secondary | ICD-10-CM | POA: Insufficient documentation

## 2024-02-01 DIAGNOSIS — I5022 Chronic systolic (congestive) heart failure: Secondary | ICD-10-CM

## 2024-02-01 DIAGNOSIS — I509 Heart failure, unspecified: Secondary | ICD-10-CM | POA: Diagnosis not present

## 2024-02-01 DIAGNOSIS — G8918 Other acute postprocedural pain: Secondary | ICD-10-CM | POA: Diagnosis not present

## 2024-02-01 DIAGNOSIS — Z01818 Encounter for other preprocedural examination: Secondary | ICD-10-CM

## 2024-02-01 HISTORY — PX: TOTAL KNEE ARTHROPLASTY: SHX125

## 2024-02-01 SURGERY — ARTHROPLASTY, KNEE, TOTAL
Anesthesia: Spinal | Site: Knee | Laterality: Right

## 2024-02-01 MED ORDER — POVIDONE-IODINE 10 % EX SWAB
2.0000 | Freq: Once | CUTANEOUS | Status: AC
  Administered 2024-02-01 (×2): 2 via TOPICAL

## 2024-02-01 MED ORDER — LORAZEPAM 0.5 MG PO TABS
0.5000 mg | ORAL_TABLET | Freq: Every evening | ORAL | Status: DC | PRN
  Filled 2024-02-01: qty 1

## 2024-02-01 MED ORDER — METHOCARBAMOL 500 MG PO TABS
500.0000 mg | ORAL_TABLET | Freq: Four times a day (QID) | ORAL | Status: DC | PRN
  Administered 2024-02-01 – 2024-02-02 (×4): 500 mg via ORAL
  Filled 2024-02-01 (×2): qty 1

## 2024-02-01 MED ORDER — PHENYLEPHRINE HCL-NACL 20-0.9 MG/250ML-% IV SOLN
INTRAVENOUS | Status: DC | PRN
  Administered 2024-02-01 (×2): 30 ug/min via INTRAVENOUS

## 2024-02-01 MED ORDER — ATORVASTATIN CALCIUM 40 MG PO TABS
40.0000 mg | ORAL_TABLET | Freq: Every day | ORAL | Status: DC
  Administered 2024-02-01 (×2): 40 mg via ORAL
  Filled 2024-02-01 (×2): qty 1

## 2024-02-01 MED ORDER — SODIUM CHLORIDE 0.9 % IV SOLN: INTRAVENOUS | Status: DC

## 2024-02-01 MED ORDER — RISPERIDONE 1 MG PO TABS
1.0000 mg | ORAL_TABLET | Freq: Every day | ORAL | Status: DC
  Administered 2024-02-01 (×2): 1 mg via ORAL
  Filled 2024-02-01: qty 1

## 2024-02-01 MED ORDER — METOCLOPRAMIDE HCL 5 MG PO TABS: 5.0000 mg | ORAL_TABLET | Freq: Three times a day (TID) | ORAL | Status: DC | PRN

## 2024-02-01 MED ORDER — DEXAMETHASONE SODIUM PHOSPHATE 10 MG/ML IJ SOLN
10.0000 mg | Freq: Once | INTRAMUSCULAR | Status: AC
  Administered 2024-02-02 (×2): 10 mg via INTRAVENOUS
  Filled 2024-02-01: qty 1

## 2024-02-01 MED ORDER — ORAL CARE MOUTH RINSE: 15.0000 mL | Freq: Once | OROMUCOSAL | Status: AC

## 2024-02-01 MED ORDER — ONDANSETRON HCL 4 MG/2ML IJ SOLN
INTRAMUSCULAR | Status: DC | PRN
  Administered 2024-02-01 (×2): 4 mg via INTRAVENOUS

## 2024-02-01 MED ORDER — OXYCODONE HCL 5 MG PO TABS
10.0000 mg | ORAL_TABLET | ORAL | Status: DC | PRN
  Filled 2024-02-01: qty 2

## 2024-02-01 MED ORDER — SODIUM CHLORIDE (PF) 0.9 % IJ SOLN
INTRAMUSCULAR | Status: AC
Start: 2024-02-01 — End: 2024-02-01
  Filled 2024-02-01: qty 50

## 2024-02-01 MED ORDER — ONDANSETRON HCL 4 MG PO TABS: 4.0000 mg | ORAL_TABLET | Freq: Four times a day (QID) | ORAL | Status: DC | PRN

## 2024-02-01 MED ORDER — TRANEXAMIC ACID-NACL 1000-0.7 MG/100ML-% IV SOLN
1000.0000 mg | INTRAVENOUS | Status: AC
Start: 2024-02-01 — End: 2024-02-01
  Administered 2024-02-01 (×2): 1000 mg via INTRAVENOUS
  Filled 2024-02-01: qty 100

## 2024-02-01 MED ORDER — OXYCODONE HCL 5 MG PO TABS
5.0000 mg | ORAL_TABLET | ORAL | Status: DC | PRN
  Administered 2024-02-01 (×2): 5 mg via ORAL
  Administered 2024-02-02 (×6): 10 mg via ORAL
  Filled 2024-02-01 (×2): qty 2
  Filled 2024-02-01: qty 1
  Filled 2024-02-01: qty 2

## 2024-02-01 MED ORDER — 0.9 % SODIUM CHLORIDE (POUR BTL) OPTIME
TOPICAL | Status: DC | PRN
  Administered 2024-02-01 (×2): 1000 mL

## 2024-02-01 MED ORDER — SACUBITRIL-VALSARTAN 24-26 MG PO TABS
1.0000 | ORAL_TABLET | Freq: Two times a day (BID) | ORAL | Status: DC
  Administered 2024-02-02 (×2): 1 via ORAL
  Filled 2024-02-01 (×2): qty 1

## 2024-02-01 MED ORDER — TRANEXAMIC ACID-NACL 1000-0.7 MG/100ML-% IV SOLN
1000.0000 mg | Freq: Once | INTRAVENOUS | Status: AC
  Administered 2024-02-01 (×2): 1000 mg via INTRAVENOUS
  Filled 2024-02-01: qty 100

## 2024-02-01 MED ORDER — ONDANSETRON HCL 4 MG/2ML IJ SOLN: 4.0000 mg | Freq: Four times a day (QID) | INTRAMUSCULAR | Status: DC | PRN

## 2024-02-01 MED ORDER — BISACODYL 10 MG RE SUPP: 10.0000 mg | Freq: Every day | RECTAL | Status: DC | PRN

## 2024-02-01 MED ORDER — DIPHENHYDRAMINE HCL 12.5 MG/5ML PO ELIX: 12.5000 mg | ORAL_SOLUTION | ORAL | Status: DC | PRN

## 2024-02-01 MED ORDER — PHENYLEPHRINE 80 MCG/ML (10ML) SYRINGE FOR IV PUSH (FOR BLOOD PRESSURE SUPPORT)
PREFILLED_SYRINGE | INTRAVENOUS | Status: DC | PRN
  Administered 2024-02-01 (×4): 80 ug via INTRAVENOUS

## 2024-02-01 MED ORDER — FAMOTIDINE 20 MG PO TABS
20.0000 mg | ORAL_TABLET | Freq: Two times a day (BID) | ORAL | Status: DC
  Administered 2024-02-01 – 2024-02-02 (×6): 20 mg via ORAL
  Filled 2024-02-01 (×3): qty 1

## 2024-02-01 MED ORDER — SENNA 8.6 MG PO TABS
2.0000 | ORAL_TABLET | Freq: Every day | ORAL | Status: DC
  Administered 2024-02-01 (×2): 17.2 mg via ORAL
  Filled 2024-02-01: qty 2

## 2024-02-01 MED ORDER — CHLORHEXIDINE GLUCONATE 0.12 % MT SOLN
15.0000 mL | Freq: Once | OROMUCOSAL | Status: AC
  Administered 2024-02-01 (×2): 15 mL via OROMUCOSAL

## 2024-02-01 MED ORDER — VENLAFAXINE HCL ER 75 MG PO CP24
225.0000 mg | ORAL_CAPSULE | Freq: Every day | ORAL | Status: DC
  Administered 2024-02-02 (×2): 225 mg via ORAL
  Filled 2024-02-01: qty 1

## 2024-02-01 MED ORDER — TRIMETHOPRIM 100 MG PO TABS
100.0000 mg | ORAL_TABLET | Freq: Every day | ORAL | Status: DC
  Administered 2024-02-01 – 2024-02-02 (×4): 100 mg via ORAL
  Filled 2024-02-01 (×2): qty 1

## 2024-02-01 MED ORDER — CLONIDINE HCL (ANALGESIA) 100 MCG/ML EP SOLN
EPIDURAL | Status: DC | PRN
Start: 2024-02-01 — End: 2024-02-01
  Administered 2024-02-01 (×2): 50 ug

## 2024-02-01 MED ORDER — SODIUM CHLORIDE (PF) 0.9 % IJ SOLN
INTRAMUSCULAR | Status: DC | PRN
  Administered 2024-02-01 (×2): 61 mL

## 2024-02-01 MED ORDER — EZETIMIBE 10 MG PO TABS
10.0000 mg | ORAL_TABLET | Freq: Every day | ORAL | Status: DC
  Administered 2024-02-01 (×2): 10 mg via ORAL
  Filled 2024-02-01 (×2): qty 1

## 2024-02-01 MED ORDER — PHENOL 1.4 % MT LIQD
1.0000 | OROMUCOSAL | Status: DC | PRN
Start: 2024-02-01 — End: 2024-02-02

## 2024-02-01 MED ORDER — ALUM & MAG HYDROXIDE-SIMETH 200-200-20 MG/5ML PO SUSP: 30.0000 mL | ORAL | Status: DC | PRN

## 2024-02-01 MED ORDER — MELATONIN 5 MG PO TABS
5.0000 mg | ORAL_TABLET | Freq: Every day | ORAL | Status: DC
  Administered 2024-02-01 (×2): 5 mg via ORAL
  Filled 2024-02-01: qty 1

## 2024-02-01 MED ORDER — FENTANYL CITRATE (PF) 100 MCG/2ML IJ SOLN
INTRAMUSCULAR | Status: AC
  Filled 2024-02-01: qty 2

## 2024-02-01 MED ORDER — SODIUM CHLORIDE 0.9 % IR SOLN
Status: DC | PRN
  Administered 2024-02-01 (×2): 1000 mL

## 2024-02-01 MED ORDER — ROPIVACAINE HCL 5 MG/ML IJ SOLN
INTRAMUSCULAR | Status: DC | PRN
  Administered 2024-02-01 (×2): 20 mL via PERINEURAL

## 2024-02-01 MED ORDER — ASPIRIN 81 MG PO CHEW
81.0000 mg | CHEWABLE_TABLET | Freq: Two times a day (BID) | ORAL | Status: DC
  Administered 2024-02-01 – 2024-02-02 (×4): 81 mg via ORAL
  Filled 2024-02-01 (×2): qty 1

## 2024-02-01 MED ORDER — ACETAMINOPHEN 500 MG PO TABS
1000.0000 mg | ORAL_TABLET | Freq: Four times a day (QID) | ORAL | Status: DC
  Administered 2024-02-01 – 2024-02-02 (×10): 1000 mg via ORAL
  Filled 2024-02-01 (×5): qty 2

## 2024-02-01 MED ORDER — ONDANSETRON HCL 4 MG/2ML IJ SOLN
INTRAMUSCULAR | Status: AC
  Filled 2024-02-01: qty 2

## 2024-02-01 MED ORDER — FENTANYL CITRATE (PF) 100 MCG/2ML IJ SOLN
INTRAMUSCULAR | Status: DC | PRN
  Administered 2024-02-01 (×4): 50 ug via INTRAVENOUS

## 2024-02-01 MED ORDER — CARVEDILOL 12.5 MG PO TABS
12.5000 mg | ORAL_TABLET | Freq: Two times a day (BID) | ORAL | Status: DC
  Filled 2024-02-01 (×2): qty 1

## 2024-02-01 MED ORDER — CEFAZOLIN SODIUM-DEXTROSE 2-4 GM/100ML-% IV SOLN
2.0000 g | Freq: Four times a day (QID) | INTRAVENOUS | Status: AC
  Administered 2024-02-01 (×4): 2 g via INTRAVENOUS
  Filled 2024-02-01 (×2): qty 100

## 2024-02-01 MED ORDER — KETOROLAC TROMETHAMINE 30 MG/ML IJ SOLN
INTRAMUSCULAR | Status: AC
  Filled 2024-02-01: qty 1

## 2024-02-01 MED ORDER — METOCLOPRAMIDE HCL 5 MG/ML IJ SOLN: 5.0000 mg | Freq: Three times a day (TID) | INTRAMUSCULAR | Status: DC | PRN

## 2024-02-01 MED ORDER — FENTANYL CITRATE PF 50 MCG/ML IJ SOSY: 25.0000 ug | PREFILLED_SYRINGE | INTRAMUSCULAR | Status: DC | PRN

## 2024-02-01 MED ORDER — MENTHOL 3 MG MT LOZG: 1.0000 | LOZENGE | OROMUCOSAL | Status: DC | PRN

## 2024-02-01 MED ORDER — PROPOFOL 10 MG/ML IV BOLUS
INTRAVENOUS | Status: DC | PRN
  Administered 2024-02-01 (×2): 30 mg via INTRAVENOUS

## 2024-02-01 MED ORDER — DEXAMETHASONE SODIUM PHOSPHATE 10 MG/ML IJ SOLN
8.0000 mg | Freq: Once | INTRAMUSCULAR | Status: AC
  Administered 2024-02-01 (×2): 8 mg via INTRAVENOUS

## 2024-02-01 MED ORDER — LACTATED RINGERS IV SOLN: INTRAVENOUS | Status: DC

## 2024-02-01 MED ORDER — BUPIVACAINE IN DEXTROSE 0.75-8.25 % IT SOLN
INTRATHECAL | Status: DC | PRN
Start: 2024-02-01 — End: 2024-02-01
  Administered 2024-02-01 (×2): 1.6 mL via INTRATHECAL

## 2024-02-01 MED ORDER — HYDROMORPHONE HCL 1 MG/ML IJ SOLN
0.5000 mg | INTRAMUSCULAR | Status: DC | PRN
  Administered 2024-02-01 (×2): 0.5 mg via INTRAVENOUS
  Filled 2024-02-01: qty 1

## 2024-02-01 MED ORDER — DEXAMETHASONE SODIUM PHOSPHATE 10 MG/ML IJ SOLN
INTRAMUSCULAR | Status: AC
  Filled 2024-02-01: qty 1

## 2024-02-01 MED ORDER — CEFAZOLIN SODIUM-DEXTROSE 2-4 GM/100ML-% IV SOLN
2.0000 g | INTRAVENOUS | Status: AC
Start: 2024-02-01 — End: 2024-02-01
  Administered 2024-02-01 (×2): 2 g via INTRAVENOUS
  Filled 2024-02-01: qty 100

## 2024-02-01 MED ORDER — AMISULPRIDE (ANTIEMETIC) 5 MG/2ML IV SOLN: 10.0000 mg | Freq: Once | INTRAVENOUS | Status: DC | PRN

## 2024-02-01 MED ORDER — PROPOFOL 500 MG/50ML IV EMUL
INTRAVENOUS | Status: DC | PRN
  Administered 2024-02-01 (×2): 75 ug/kg/min via INTRAVENOUS

## 2024-02-01 MED ORDER — POLYETHYLENE GLYCOL 3350 17 G PO PACK
17.0000 g | PACK | Freq: Two times a day (BID) | ORAL | Status: DC
  Administered 2024-02-01 – 2024-02-02 (×6): 17 g via ORAL
  Filled 2024-02-01 (×3): qty 1

## 2024-02-01 MED ORDER — METHOCARBAMOL 1000 MG/10ML IJ SOLN: 500.0000 mg | Freq: Four times a day (QID) | INTRAMUSCULAR | Status: DC | PRN

## 2024-02-01 MED ORDER — LATANOPROST 0.005 % OP SOLN
1.0000 [drp] | Freq: Every day | OPHTHALMIC | Status: DC
  Administered 2024-02-01 (×2): 1 [drp] via OPHTHALMIC
  Filled 2024-02-01: qty 2.5

## 2024-02-01 SURGICAL SUPPLY — 49 items
BAG COUNTER SPONGE SURGICOUNT (BAG) IMPLANT
BAG ZIPLOCK 12X15 (MISCELLANEOUS) ×1 IMPLANT
BLADE SAW SGTL 11.0X1.19X90.0M (BLADE) IMPLANT
BLADE SAW SGTL 13.0X1.19X90.0M (BLADE) ×1 IMPLANT
BNDG ELASTIC 6INX 5YD STR LF (GAUZE/BANDAGES/DRESSINGS) ×1 IMPLANT
BOWL SMART MIX CTS (DISPOSABLE) ×1 IMPLANT
CEMENT HV SMART SET (Cement) ×2 IMPLANT
COOLER ICEMAN CLASSIC (MISCELLANEOUS) IMPLANT
COVER SURGICAL LIGHT HANDLE (MISCELLANEOUS) ×1 IMPLANT
CUFF TRNQT CYL 34X4.125X (TOURNIQUET CUFF) ×1 IMPLANT
DERMABOND ADVANCED .7 DNX12 (GAUZE/BANDAGES/DRESSINGS) ×1 IMPLANT
DRAPE U-SHAPE 47X51 STRL (DRAPES) ×1 IMPLANT
DRESSING AQUACEL AG SP 3.5X10 (GAUZE/BANDAGES/DRESSINGS) ×1 IMPLANT
DURAPREP 26ML APPLICATOR (WOUND CARE) ×2 IMPLANT
ELECT REM PT RETURN 15FT ADLT (MISCELLANEOUS) ×1 IMPLANT
GLOVE BIO SURGEON STRL SZ 6 (GLOVE) ×1 IMPLANT
GLOVE BIOGEL PI IND STRL 6.5 (GLOVE) ×1 IMPLANT
GLOVE BIOGEL PI IND STRL 7.5 (GLOVE) ×1 IMPLANT
GLOVE ORTHO TXT STRL SZ7.5 (GLOVE) ×2 IMPLANT
GOWN STRL REUS W/ TWL LRG LVL3 (GOWN DISPOSABLE) ×2 IMPLANT
HOLDER FOLEY CATH W/STRAP (MISCELLANEOUS) IMPLANT
IMPL PSN FEM CR CMT STD SZ 8R (Joint) IMPLANT
INSERT TIBIA KNEE RIGHT 10 (Joint) IMPLANT
KIT TURNOVER KIT A (KITS) ×1 IMPLANT
MANIFOLD NEPTUNE II (INSTRUMENTS) ×1 IMPLANT
NDL SAFETY ECLIPSE 18X1.5 (NEEDLE) IMPLANT
NS IRRIG 1000ML POUR BTL (IV SOLUTION) ×1 IMPLANT
PACK TOTAL KNEE CUSTOM (KITS) ×2 IMPLANT
PAD COLD SHLDR WRAP-ON (PAD) IMPLANT
PENCIL SMOKE EVACUATOR (MISCELLANEOUS) ×1 IMPLANT
PIN DRILL HDLS TROCAR 75 4PK (PIN) IMPLANT
PROTECTOR NERVE ULNAR (MISCELLANEOUS) ×1 IMPLANT
SCREW FEMALE HEX FIX 25X2.5 (ORTHOPEDIC DISPOSABLE SUPPLIES) IMPLANT
SET HNDPC FAN SPRY TIP SCT (DISPOSABLE) ×1 IMPLANT
SET PAD KNEE POSITIONER (MISCELLANEOUS) ×1 IMPLANT
SPIKE FLUID TRANSFER (MISCELLANEOUS) ×2 IMPLANT
STEM POLY PAT PLY 35M KNEE (Knees) IMPLANT
STEM TIBIA 5 DEG SZ F R KNEE (Knees) IMPLANT
SUT MNCRL AB 4-0 PS2 18 (SUTURE) ×1 IMPLANT
SUT STRATAFIX PDS+ 0 24IN (SUTURE) ×1 IMPLANT
SUT VIC AB 1 CT1 36 (SUTURE) ×1 IMPLANT
SUT VIC AB 2-0 CT1 TAPERPNT 27 (SUTURE) ×2 IMPLANT
SYR 3ML LL SCALE MARK (SYRINGE) ×1 IMPLANT
TOWEL GREEN STERILE FF (TOWEL DISPOSABLE) ×1 IMPLANT
TRAY FOLEY MTR SLVR 14FR STAT (SET/KITS/TRAYS/PACK) IMPLANT
TRAY FOLEY MTR SLVR 16FR STAT (SET/KITS/TRAYS/PACK) ×1 IMPLANT
TUBE SUCTION HIGH CAP CLEAR NV (SUCTIONS) ×1 IMPLANT
WATER STERILE IRR 1000ML POUR (IV SOLUTION) ×2 IMPLANT
WRAP KNEE MAXI GEL POST OP (GAUZE/BANDAGES/DRESSINGS) ×1 IMPLANT

## 2024-02-01 NOTE — Anesthesia Procedure Notes (Signed)
 Procedure Name: MAC Date/Time: 02/01/2024 7:18 AM  Performed by: Judythe Tanda Aran, CRNAPre-anesthesia Checklist: Patient identified, Emergency Drugs available, Suction available and Patient being monitored Patient Re-evaluated:Patient Re-evaluated prior to induction Oxygen Delivery Method: Nasal cannula

## 2024-02-01 NOTE — Evaluation (Signed)
 Physical Therapy Evaluation Patient Details Name: Kristin Mack MRN: [id number] DOB: September 11, 1940 Today's Date: 02/01/2024  History of Present Illness  Pt s/p R TKA and with hx of CHF, CAD, MI, cardiomyopathy, L THR (20+ years ago with residual drop foot noted)  Clinical Impression  Pt is s/p TKA resulting in the deficits listed below (see PT Problem List). Pt in bed and agreeable to PT. Orthostatics assessed and proceeds to in room amb and mobility, returns to reclining chair, and initiates HEP (quad set, ankle pumps q1-2 hrs). Pt asking good questions about anticipated progress in the coming days and positioning at home to best assist recovery. Pt will benefit from acute skilled PT to increase their independence and safety with mobility to allow discharge.   Orthostatics: Supine- 103/68, 96% O2, 64 bpm Sitting- 110/71, 96% O2, 70 bpm Standing- 101/61, 97% O2, 74bpm Stand +84min- 111/68, 98%O2, 73bpm         If plan is discharge home, recommend the following: A little help with walking and/or transfers;A little help with bathing/dressing/bathroom;Assistance with cooking/housework;Assist for transportation;Help with stairs or ramp for entrance   Can travel by private vehicle        Equipment Recommendations None recommended by PT  Recommendations for Other Services       Functional Status Assessment Patient has had a recent decline in their functional status and demonstrates the ability to make significant improvements in function in a reasonable and predictable amount of time.     Precautions / Restrictions Precautions Precautions: Fall Restrictions Weight Bearing Restrictions Per Provider Order: Yes RLE Weight Bearing Per Provider Order: Weight bearing as tolerated      Mobility  Bed Mobility Overal bed mobility: Needs Assistance Bed Mobility: Supine to Sit     Supine to sit: Supervision, HOB elevated     General bed mobility comments: increased time for task,  slow movements to RLE    Transfers Overall transfer level: Needs assistance Equipment used: Rolling walker (2 wheels) Transfers: Sit to/from Stand, Bed to chair/wheelchair/BSC Sit to Stand: Contact guard assist   Step pivot transfers: Contact guard assist       General transfer comment: forward trunk position with standing, bed elevated slightly, small steps with transfer to chair    Ambulation/Gait Ambulation/Gait assistance: Contact guard assist Gait Distance (Feet): 18 Feet Assistive device: Rolling walker (2 wheels) Gait Pattern/deviations: Step-to pattern, Antalgic, Decreased stance time - right, Decreased step length - left, Narrow base of support Gait velocity: dec     General Gait Details: Pt amb with step to pattern with LLE not surpassing RLE stance. She is able to note some improvement in WB tolerance as steps progress. Reports decreased sensation in RLE but able to demonstrate good muscle activation during functional movements and gait  Stairs            Wheelchair Mobility     Tilt Bed    Modified Rankin (Stroke Patients Only)       Balance Overall balance assessment: Needs assistance Sitting-balance support: Feet supported, No upper extremity supported Sitting balance-Leahy Scale: Good     Standing balance support: No upper extremity supported Standing balance-Leahy Scale: Fair                               Pertinent Vitals/Pain Pain Assessment Pain Assessment: 0-10 Pain Score: 4  Pain Location: R knee Pain Descriptors / Indicators: Aching, Constant, Discomfort, Operative site guarding  Pain Intervention(s): Limited activity within patient's tolerance, Monitored during session, Ice applied, Repositioned    Home Living Family/patient expects to be discharged to:: Private residence Living Arrangements: Children Available Help at Discharge: Family;Available 24 hours/day Type of Home: House Home Access: Ramped entrance        Home Layout: One level Home Equipment: Cane - single Librarian, academic (2 wheels);Toilet riser      Prior Function                 ADLs Comments: has had some help from her son, who lives with her over the last 2-3 weeks due to R knee dysfunction     Extremity/Trunk Assessment   Upper Extremity Assessment Upper Extremity Assessment: Overall WFL for tasks assessed    Lower Extremity Assessment Lower Extremity Assessment: RLE deficits/detail RLE Deficits / Details: Pt continues to have deficits in RLE sensation and function related to POD0 R TKA       Communication   Communication Communication: Impaired Factors Affecting Communication: Hearing impaired    Cognition Arousal: Alert Behavior During Therapy: WFL for tasks assessed/performed   PT - Cognitive impairments: No apparent impairments                         Following commands: Intact       Cueing Cueing Techniques: Verbal cues, Gestural cues, Visual cues     General Comments General comments (skin integrity, edema, etc.): dressing C/D/I and ice reapplied to R knee    Exercises Total Joint Exercises Ankle Circles/Pumps: AROM, 10 reps, Both Quad Sets: AROM, 10 reps, Both   Assessment/Plan    PT Assessment Patient needs continued PT services  PT Problem List Decreased strength;Decreased balance;Decreased range of motion;Decreased mobility;Decreased activity tolerance       PT Treatment Interventions DME instruction;Functional mobility training;Balance training;Patient/family education;Gait training;Therapeutic activities;Stair training;Therapeutic exercise    PT Goals (Current goals can be found in the Care Plan section)  Acute Rehab PT Goals Patient Stated Goal: improve tolerance to walking PT Goal Formulation: With patient Time For Goal Achievement: 02/15/24 Potential to Achieve Goals: Good    Frequency 7X/week     Co-evaluation               AM-PAC PT 6 Clicks  Mobility  Outcome Measure Help needed turning from your back to your side while in a flat bed without using bedrails?: A Little Help needed moving from lying on your back to sitting on the side of a flat bed without using bedrails?: A Little Help needed moving to and from a bed to a chair (including a wheelchair)?: A Little Help needed standing up from a chair using your arms (e.g., wheelchair or bedside chair)?: A Little Help needed to walk in hospital room?: A Little Help needed climbing 3-5 steps with a railing? : Total 6 Click Score: 16    End of Session Equipment Utilized During Treatment: Gait belt Activity Tolerance: Patient tolerated treatment well Patient left: in chair;with call bell/phone within reach;with chair alarm set Nurse Communication: Mobility status PT Visit Diagnosis: Unsteadiness on feet (R26.81);Difficulty in walking, not elsewhere classified (R26.2)    Time: 8798-8745 PT Time Calculation (min) (ACUTE ONLY): 53 min   Charges:   PT Evaluation $PT Eval Low Complexity: 1 Low PT Treatments $Gait Training: 8-22 mins $Therapeutic Exercise: 8-22 mins $Therapeutic Activity: 8-22 mins PT General Charges $$ ACUTE PT VISIT: 1 Visit  Stann, PT Acute Rehabilitation Services Office: [phone] 02/01/2024   Stann DELENA Ohara 02/01/2024, 1:09 PM

## 2024-02-01 NOTE — Care Plan (Signed)
 Ortho Bundle Case Management Note  Patient Details  Name: Kristin Mack MRN: [id number] Date of Birth: [birth year]  R TKA on 02/01/24.   DCP: Home with son Arvella DME: No needs. Has RW. Getting ice machine at hospital.  PT: EO                   DME Arranged:  N/A DME Agency:  NA  HH Arranged:    HH Agency:     Additional Comments: Please contact me with any questions of if this plan should need to change.  Lyle Pepper, CCM [facility] [phone]   02/01/2024, 8:02 AM

## 2024-02-01 NOTE — Anesthesia Postprocedure Evaluation (Signed)
 Anesthesia Post Note  Patient: Kristin Mack  Procedure(s) Performed: ARTHROPLASTY, KNEE, TOTAL (Right: Knee)     Patient location during evaluation: PACU Anesthesia Type: Spinal Level of consciousness: awake and alert Pain management: pain level controlled Vital Signs Assessment: post-procedure vital signs reviewed and stable Respiratory status: spontaneous breathing and respiratory function stable Cardiovascular status: blood pressure returned to baseline and stable Postop Assessment: spinal receding Anesthetic complications: no   No notable events documented.  Last Vitals:  Vitals:   02/01/24 1632 02/01/24 1634  BP: (!) 88/46 90/63  Pulse: 66 72  Resp: 16   Temp: 36.7 C   SpO2: 94%     Last Pain:  Vitals:   02/01/24 1349  TempSrc:   PainSc: 3                  Epifanio Lamar BRAVO

## 2024-02-01 NOTE — Progress Notes (Signed)
 Provider updated post NS bolus 250 cc.   02/01/24 1849  Vitals  BP 103/65  MAP (mmHg) 77  BP Location Left Arm  BP Method Automatic  Patient Position (if appropriate) Lying  Pulse Rate 65  MEWS COLOR  MEWS Score Color Green  MEWS Score  MEWS Temp 0  MEWS Systolic 0  MEWS Pulse 0  MEWS RR 0  MEWS LOC 0  MEWS Score 0

## 2024-02-01 NOTE — Anesthesia Procedure Notes (Signed)
 Anesthesia Regional Block: Adductor canal block   Pre-Anesthetic Checklist: , timeout performed,  Correct Patient, Correct Site, Correct Laterality,  Correct Procedure, Correct Position, site marked,  Risks and benefits discussed,  Surgical consent,  Pre-op evaluation,  At surgeon's request and post-op pain management  Laterality: Right  Prep: chloraprep       Needles:  Injection technique: Single-shot  Needle Type: Echogenic Needle     Needle Length: 9cm  Needle Gauge: 21     Additional Needles:   Procedures:,,,, ultrasound used (permanent image in chart),,    Narrative:  Start time: 02/01/2024 6:50 AM End time: 02/01/2024 6:55 AM Injection made incrementally with aspirations every 5 mL.  Performed by: Personally  Anesthesiologist: Epifanio Charleston, MD

## 2024-02-01 NOTE — Interval H&P Note (Signed)
 History and Physical Interval Note:  02/01/2024 8:16 AM  Kristin Mack  has presented today for surgery, with the diagnosis of Right knee osteoarthritis.  The various methods of treatment have been discussed with the patient and family. After consideration of risks, benefits and other options for treatment, the patient has consented to  Procedure(s): ARTHROPLASTY, KNEE, TOTAL (Right) as a surgical intervention.  The patient's history has been reviewed, patient examined, no change in status, stable for surgery.  I have reviewed the patient's chart and labs.  Questions were answered to the patient's satisfaction.     Kristin Mack

## 2024-02-01 NOTE — Discharge Instructions (Signed)

## 2024-02-01 NOTE — Progress Notes (Signed)
 Provider updated, regarding abnormal VS. V.O received to administer NS bolus 250 cc, once.   02/01/24 1632  Vitals  Temp 98.1 F (36.7 C)  BP (!) 88/46  MAP (mmHg) (!) 61  BP Location Left Arm  BP Method Automatic  Patient Position (if appropriate) Lying  Pulse Rate 66  Pulse Rate Source Monitor  Resp 16  MEWS COLOR  MEWS Score Color Green  Oxygen Therapy  SpO2 94 %  O2 Device Room Air  MEWS Score  MEWS Temp 0  MEWS Systolic 1  MEWS Pulse 0  MEWS RR 0  MEWS LOC 0  MEWS Score 1

## 2024-02-01 NOTE — Op Note (Signed)
 NAME:  Kristin Mack [facility]                      MEDICAL RECORD NO.:  [id number]                             FACILITY:  [facility]      PHYSICIAN:  Kristin BIRCH. Mack, M.D.  DATE OF BIRTH:  05-09-1941      DATE OF PROCEDURE:  02/01/2024                                     OPERATIVE REPORT         PREOPERATIVE DIAGNOSIS:  Right knee osteoarthritis.      POSTOPERATIVE DIAGNOSIS:  Right knee osteoarthritis.      FINDINGS:  The patient was noted to have complete loss of cartilage and   bone-on-bone arthritis with associated osteophytes in the medial and patellofemoral compartments of   the knee with a significant synovitis and associated effusion.  The patient had failed months of conservative treatment including medications, injection therapy, activity modification.     PROCEDURE:  Right total knee replacement.      COMPONENTS USED:  Zimmer Persona CR/MC Nitrided right size 8 femur, right size F tibial baseplate, size 10 CR MC insert and 35 patella button     SURGEON:  Kristin BIRCH. Mack, M.D.      ASSISTANT:  [name], PA-C.      ANESTHESIA:  Regional and Spinal.      SPECIMENS:  None.      COMPLICATION:  None.      DRAINS:  None.  EBL: <100 cc      TOURNIQUET TIME:   Total Tourniquet Time Documented: Thigh (Right) - 31 minutes Total: Thigh (Right) - 31 minutes  .      The patient was stable to the recovery room.      INDICATION FOR PROCEDURE:  Kristin Mack is a 83 y.o. female patient of   mine.  The patient had been seen, evaluated, and treated for months conservatively in the   office with medication, activity modification, and injections.  The patient had   radiographic changes of bone-on-bone arthritis with endplate sclerosis and osteophytes noted.  Based on the radiographic changes and failed conservative measures, the patient   decided to proceed with definitive treatment, total knee replacement.  Risks of infection, DVT, component failure, need for revision  surgery, neurovascular injury were reviewed in the office setting.  The postop course was reviewed stressing the efforts to maximize post-operative satisfaction and function.  Consent was obtained for benefit of pain   relief.      PROCEDURE IN DETAIL:  The patient was brought to the operative theater.   Once adequate anesthesia, preoperative antibiotics, 2 gm of Ancef ,1 gm of Tranexamic Acid , and 10 mg of Decadron  administered, the patient was positioned supine with a right thigh tourniquet placed.  The  right lower extremity was prepped and draped in sterile fashion.  A time-   out was performed identifying the patient, planned procedure, and the appropriate extremity.      The right lower extremity was placed in the [facility] leg holder.  The leg was   exsanguinated, tourniquet elevated to 225 mmHg.  A midline incision was   made followed by median parapatellar arthrotomy.  Following  initial   exposure, attention was first directed to the patella.  Precut   measurement was noted to be 22 mm.  I resected down to 13 mm and used a   35 anatomic patellar button to restore patellar height as well as cover the cut surface.      The lug holes were drilled and a metal shim was placed to protect the   patella from retractors and saw blade during the procedure.      At this point, attention was now directed to the femur.  The femoral   canal was opened with a drill, irrigated to try to prevent fat emboli.  An   intramedullary rod was passed at 5 degrees valgus, 10 mm of bone was   resected off the distal femur.  Following this resection, the tibia was   subluxated anteriorly.  Using the extramedullary guide, 2 mm of bone was resected off   the proximal medial tibia.  We confirmed the gap would be   stable medially and laterally with a size 10 spacer block as well as confirmed that the tibial cut was perpendicular in the coronal plane, checking with an alignment rod.      Once this was done, I sized  the femur to be a size 8 in the anterior-   posterior dimension, chose a standard component based on medial and   lateral dimension.  Rotation was pinned in 3 degrees of external rotation.  The size 8, 4 in 1 cutting block was pinned into place and the   anterior, posterior, and  chamfer cuts were made without difficulty nor   notching making certain that I was along the anterior cortex to help   with flexion gap stability.      At this point, the tibia was sized to be a size F.  The size F tray was   then pinned in position through the medial third of the tubercle,   drilled, and keel punched.  Trial reduction was now carried with a 8 femur,  F tibia, a size 10 mm CR insert, and the 35 anatomic patella botton.  The knee was brought to full extension with good flexion stability with the patella   tracking through the trochlea without application of pressure.  Given   all these findings the trial components removed.  Final components were   opened and cement was mixed.  The knee was irrigated with normal saline solution and pulse lavage.  The synovial lining was   then injected with 30 cc of 0.25% Marcaine  with epinephrine , 1 cc of Toradol  and 30 cc of NS for a total of 61 cc.     Final implants were then cemented onto cleaned and dried cut surfaces of bone with the knee brought to extension with a size 10 mm CR trial insert.      Once the cement had fully cured, excess cement was removed   throughout the knee.  I confirmed that I was satisfied with the range of   motion and stability, and the final size 10 mm CR MC AOX insert was chosen.  It was   placed into the knee.      The tourniquet had been let down at 31 minutes.  No significant   hemostasis was required.  The extensor mechanism was then reapproximated using #1 Vicryl and #1 Stratafix sutures with the knee   in flexion.  The   remaining wound was closed with 2-0 Vicryl and  running 4-0 Monocryl.   The knee was cleaned, dried,  dressed sterilely using Dermabond and   Aquacel dressing.  The patient was then   brought to recovery room in stable condition, tolerating the procedure   well.   Please note that Physician Assistant, [name], PA-C was present for the entirety of the case, and was utilized for pre-operative positioning, peri-operative retractor management, general facilitation of the procedure and for primary wound closure at the end of the case.              Kristin Mack, M.D.    02/01/2024 8:29 AM

## 2024-02-01 NOTE — Transfer of Care (Signed)
 Immediate Anesthesia Transfer of Care Note  Patient: Kristin Mack  Procedure(s) Performed: ARTHROPLASTY, KNEE, TOTAL (Right: Knee)  Patient Location: PACU  Anesthesia Type:Spinal  Level of Consciousness: awake and patient cooperative  Airway & Oxygen Therapy: Patient Spontanous Breathing and Patient connected to nasal cannula oxygen  Post-op Assessment: Report given to RN and Post -op Vital signs reviewed and stable  Post vital signs: Reviewed and stable  Last Vitals:  Vitals Value Taken Time  BP    Temp    Pulse    Resp    SpO2      Last Pain:  Vitals:   02/01/24 0608  TempSrc:   PainSc: 0-No pain         Complications: No notable events documented.

## 2024-02-01 NOTE — Anesthesia Procedure Notes (Signed)
 Spinal  Patient location during procedure: OR Start time: 02/01/2024 7:15 AM End time: 02/01/2024 7:21 AM Reason for block: surgical anesthesia Staffing Performed: anesthesiologist  Anesthesiologist: Epifanio Charleston, MD Performed by: Epifanio Charleston, MD Authorized by: Epifanio Charleston, MD   Preanesthetic Checklist Completed: patient identified, IV checked, site marked, risks and benefits discussed, surgical consent, monitors and equipment checked, pre-op evaluation and timeout performed Spinal Block Patient position: sitting Prep: DuraPrep Patient monitoring: heart rate, cardiac monitor, continuous pulse ox and blood pressure Approach: midline Location: L4-5 Injection technique: single-shot Needle Needle type: Pencan  Needle gauge: 24 G Needle length: 9 cm Assessment Sensory level: T4 Events: CSF return

## 2024-02-01 NOTE — Plan of Care (Signed)
  Problem: Education: Goal: Knowledge of General Education information will improve Description: Including pain rating scale, medication(s)/side effects and non-pharmacologic comfort measures Outcome: Progressing   Problem: Activity: Goal: Risk for activity intolerance will decrease Outcome: Progressing   Problem: Nutrition: Goal: Adequate nutrition will be maintained Outcome: Progressing   Problem: Elimination: Goal: Will not experience complications related to bowel motility Outcome: Progressing   Problem: Pain Managment: Goal: General experience of comfort will improve and/or be controlled Outcome: Progressing   Problem: Safety: Goal: Ability to remain free from injury will improve Outcome: Progressing   Problem: Education: Goal: Knowledge of the prescribed therapeutic regimen will improve Outcome: Progressing   Problem: Activity: Goal: Range of joint motion will improve Outcome: Progressing   Problem: Pain Management: Goal: Pain level will decrease with appropriate interventions Outcome: Progressing   Problem: Skin Integrity: Goal: Will show signs of wound healing Outcome: Progressing

## 2024-02-02 ENCOUNTER — Encounter (HOSPITAL_COMMUNITY): Payer: Self-pay | Admitting: Orthopedic Surgery

## 2024-02-02 DIAGNOSIS — I509 Heart failure, unspecified: Secondary | ICD-10-CM | POA: Diagnosis not present

## 2024-02-02 DIAGNOSIS — I2510 Atherosclerotic heart disease of native coronary artery without angina pectoris: Secondary | ICD-10-CM | POA: Diagnosis not present

## 2024-02-02 DIAGNOSIS — I110 Hypertensive heart disease with heart failure: Secondary | ICD-10-CM | POA: Diagnosis not present

## 2024-02-02 DIAGNOSIS — Z7982 Long term (current) use of aspirin: Secondary | ICD-10-CM | POA: Diagnosis not present

## 2024-02-02 DIAGNOSIS — M1711 Unilateral primary osteoarthritis, right knee: Secondary | ICD-10-CM | POA: Diagnosis not present

## 2024-02-02 DIAGNOSIS — Z96651 Presence of right artificial knee joint: Secondary | ICD-10-CM | POA: Diagnosis not present

## 2024-02-02 LAB — BASIC METABOLIC PANEL WITH GFR
Anion gap: 7 (ref 5–15)
BUN: 18 mg/dL (ref 8–23)
CO2: 23 mmol/L (ref 22–32)
Calcium: 8.2 mg/dL — ABNORMAL LOW (ref 8.9–10.3)
Chloride: 102 mmol/L (ref 98–111)
Creatinine, Ser: 0.9 mg/dL (ref 0.44–1.00)
GFR, Estimated: >60 mL/min (ref >60)
Glucose, Bld: 126 mg/dL — ABNORMAL HIGH (ref 70–99)
Potassium: 4.6 mmol/L (ref 3.5–5.1)
Sodium: 132 mmol/L — ABNORMAL LOW (ref 135–145)

## 2024-02-02 LAB — CBC
HCT: 35.2 % — ABNORMAL LOW (ref 36.0–46.0)
Hemoglobin: 12 g/dL (ref 12.0–15.0)
MCH: 32.1 pg (ref 26.0–34.0)
MCHC: 34.1 g/dL (ref 30.0–36.0)
MCV: 94.1 fL (ref 80.0–100.0)
Platelets: 195 K/uL (ref 150–400)
RBC: 3.74 MIL/uL — ABNORMAL LOW (ref 3.87–5.11)
RDW: 14 % (ref 11.5–15.5)
WBC: 12.1 K/uL — ABNORMAL HIGH (ref 4.0–10.5)
nRBC: 0 % (ref 0.0–0.2)

## 2024-02-02 MED ORDER — METHOCARBAMOL 500 MG PO TABS: 500.0000 mg | ORAL_TABLET | Freq: Four times a day (QID) | ORAL | 2 refills | Status: AC | PRN

## 2024-02-02 MED ORDER — OXYCODONE HCL 5 MG PO TABS: 5.0000 mg | ORAL_TABLET | ORAL | 0 refills | Status: AC | PRN

## 2024-02-02 MED ORDER — POLYETHYLENE GLYCOL 3350 17 G PO PACK: 17.0000 g | PACK | Freq: Two times a day (BID) | ORAL | 0 refills | Status: AC

## 2024-02-02 MED ORDER — SENNA 8.6 MG PO TABS: 2.0000 | ORAL_TABLET | Freq: Every day | ORAL | 0 refills | Status: AC

## 2024-02-02 MED ORDER — ASPIRIN 81 MG PO CHEW: 81.0000 mg | CHEWABLE_TABLET | Freq: Two times a day (BID) | ORAL | 0 refills | Status: AC

## 2024-02-02 NOTE — Progress Notes (Addendum)
 Subjective: 1 Day Post-Op Procedure(s) (LRB): ARTHROPLASTY, KNEE, TOTAL (Right) Patient reports pain as mild.   Patient seen in rounds for Dr. Ernie. Patient is well, and has had no acute complaints or problems. No acute events overnight. Foley catheter removed. Patient has not been up with PT yet.  We will start therapy today.   Objective: Vital signs in last 24 hours: Temp:  [97.3 F (36.3 C)-98.1 F (36.7 C)] 97.7 F (36.5 C) (08/13 0554) Pulse Rate:  [52-72] 60 (08/13 0554) Resp:  [10-20] 18 (08/13 0554) BP: (88-131)/(46-72) 113/58 (08/13 0554) SpO2:  [94 %-100 %] 97 % (08/13 0554) Weight:  [83.5 kg] 83.5 kg (08/12 1030)  Intake/Output from previous day:  Intake/Output Summary (Last 24 hours) at 02/02/2024 0753 Last data filed at 02/02/2024 0600 Gross per 24 hour  Intake 2195.29 ml  Output 1925 ml  Net 270.29 ml     Intake/Output this shift: No intake/output data recorded.  Labs: Recent Labs    02/02/24 0329  HGB 12.0   Recent Labs    02/02/24 0329  WBC 12.1*  RBC 3.74*  HCT 35.2*  PLT 195   Recent Labs    02/02/24 0329  NA 132*  K 4.6  CL 102  CO2 23  BUN 18  CREATININE 0.90  GLUCOSE 126*  CALCIUM  8.2*   No results for input(s): LABPT, INR in the last 72 hours.  Exam: General - Patient is Alert and Oriented Extremity - Neurologically intact Sensation intact distally Intact pulses distally Dorsiflexion/Plantar flexion intact Dressing - dressing C/D/I Motor Function - intact, moving foot and toes well on exam.   Past Medical History:  Diagnosis Date   Anxiety    Carpal tunnel syndrome    Left Hand  nueuropath L foot   Chest pain    CHF (congestive heart failure) (HCC)    Dilated cardiomyopathy. Her original ejection fraction was 10-20%. Her ejection fraction has improved to 57% by 2009   Coronary artery disease    Depression    Dyslipidemia    Foot drop, left    from a left hip repacement   GERD (gastroesophageal reflux  disease)    Hemorrhoids    History of anxiety    Hypertension    Lower back pain    Myocardial infarction (HCC)    2002   Obesity    Osteoarthritis    Severe with collapse of the left hip   Pneumonia    Single functional kidney    single right kidney,  left kidney is atrophic    Assessment/Plan: 1 Day Post-Op Procedure(s) (LRB): ARTHROPLASTY, KNEE, TOTAL (Right) Principal Problem:   S/P total knee arthroplasty, right  Estimated body mass index is 29.71 kg/m as calculated from the following:   Height as of this encounter: 5' 6 (1.676 m).   Weight as of this encounter: 83.5 kg. Advance diet Up with therapy D/C IV fluids   Patient's anticipated LOS is less than 2 midnights, meeting these requirements: - Younger than 44 - Lives within 1 hour of care - Has a competent adult at home to recover with post-op recover - NO history of  - Chronic pain requiring opiods  - Diabetes  - Coronary Artery Disease  - Heart failure  - Heart attack  - Stroke  - DVT/VTE  - Cardiac arrhythmia  - Respiratory Failure/COPD  - Renal failure  - Anemia  - Advanced Liver disease     DVT Prophylaxis - Aspirin  Weight bearing as  tolerated.  Hgb stable at 12 this AM  She has been hypotensive but stable Gave small fluid bolus today Hx of CHF  Possible d/c home today Will see how her BP does and how she tolerates PT  [name], PA-C Orthopedic Surgery [phone] 02/02/2024, 7:53 AM

## 2024-02-02 NOTE — TOC Transition Note (Signed)
 Transition of Care [facility]) - Discharge Note   Patient Details  Name: Kristin Mack MRN: [id number] Date of Birth: 1941-01-26  [facility] CM/SW Contact:  NORMAN ASPEN, LCSW Phone Number: 02/02/2024, 9:57 AM   Clinical Narrative:     Met with pt who confirms she has needed DME in the home.  OPPT already arranged with [facility].  No further TOC needs.  Final next level of care: OP Rehab Barriers to Discharge: No Barriers Identified   Patient Goals and CMS Choice Patient states their goals for this hospitalization and ongoing recovery are:: return home          Discharge Placement                       Discharge Plan and Services Additional resources added to the After Visit Summary for                  DME Arranged: N/A DME Agency: NA                  Social Drivers of Health (SDOH) Interventions SDOH Screenings   Food Insecurity: No Food Insecurity (02/01/2024)  Housing: Low Risk  (02/01/2024)  Transportation Needs: No Transportation Needs (02/01/2024)  Utilities: Not At Risk (02/01/2024)  Depression (PHQ2-9): Medium Risk (07/22/2021)  Social Connections: Unknown (02/01/2024)  Tobacco Use: Low Risk  (02/01/2024)     Readmission Risk Interventions     No data to display

## 2024-02-02 NOTE — Progress Notes (Addendum)
 Subjective: 1 Day Post-Op Procedure(s) (LRB): ARTHROPLASTY, KNEE, TOTAL (Right) Patient reports pain as mild.   Patient seen in rounds for Dr. Ernie. Patient is well, and has had no acute complaints or problems. No acute events overnight. Foley catheter removed. Patient has not been up with PT yet.  We will start therapy today.   Objective: Vital signs in last 24 hours: Temp:  [97.3 F (36.3 C)-98.1 F (36.7 C)] 97.7 F (36.5 C) (08/13 0554) Pulse Rate:  [52-72] 60 (08/13 0554) Resp:  [10-20] 18 (08/13 0554) BP: (88-131)/(46-72) 113/58 (08/13 0554) SpO2:  [94 %-100 %] 97 % (08/13 0554) Weight:  [83.5 kg] 83.5 kg (08/12 1030)  Intake/Output from previous day:  Intake/Output Summary (Last 24 hours) at 02/02/2024 0753 Last data filed at 02/02/2024 0600 Gross per 24 hour  Intake 2195.29 ml  Output 1925 ml  Net 270.29 ml     Intake/Output this shift: No intake/output data recorded.  Labs: Recent Labs    02/02/24 0329  HGB 12.0   Recent Labs    02/02/24 0329  WBC 12.1*  RBC 3.74*  HCT 35.2*  PLT 195   Recent Labs    02/02/24 0329  NA 132*  K 4.6  CL 102  CO2 23  BUN 18  CREATININE 0.90  GLUCOSE 126*  CALCIUM  8.2*   No results for input(s): LABPT, INR in the last 72 hours.  Exam: General - Patient is Alert and Oriented Extremity - Neurologically intact Sensation intact distally Intact pulses distally Dorsiflexion/Plantar flexion intact Dressing - dressing C/D/I Motor Function - intact, moving foot and toes well on exam.   Past Medical History:  Diagnosis Date   Anxiety    Carpal tunnel syndrome    Left Hand  nueuropath L foot   Chest pain    CHF (congestive heart failure) (HCC)    Dilated cardiomyopathy. Her original ejection fraction was 10-20%. Her ejection fraction has improved to 57% by 2009   Coronary artery disease    Depression    Dyslipidemia    Foot drop, left    from a left hip repacement   GERD (gastroesophageal reflux  disease)    Hemorrhoids    History of anxiety    Hypertension    Lower back pain    Myocardial infarction (HCC)    2002   Obesity    Osteoarthritis    Severe with collapse of the left hip   Pneumonia    Single functional kidney    single right kidney,  left kidney is atrophic    Assessment/Plan: 1 Day Post-Op Procedure(s) (LRB): ARTHROPLASTY, KNEE, TOTAL (Right) Principal Problem:   S/P total knee arthroplasty, right  Estimated body mass index is 29.71 kg/m as calculated from the following:   Height as of this encounter: 5' 6 (1.676 m).   Weight as of this encounter: 83.5 kg. Advance diet Up with therapy D/C IV fluids   Patient's anticipated LOS is less than 2 midnights, meeting these requirements: - Younger than 56 - Lives within 1 hour of care - Has a competent adult at home to recover with post-op recover - NO history of  - Chronic pain requiring opiods  - Diabetes  - Coronary Artery Disease  - Heart failure  - Heart attack  - Stroke  - DVT/VTE  - Cardiac arrhythmia  - Respiratory Failure/COPD  - Renal failure  - Anemia  - Advanced Liver disease     DVT Prophylaxis - Aspirin  Weight bearing as  tolerated.  Hgb stable at 12 this AM  She has been hypotensive but stable Gave small fluid bolus today Hx of CHF  Possible d/c home today Will see how her BP does and how she tolerates PT  [name], PA-C Orthopedic Surgery [phone] 02/02/2024, 7:53 AM

## 2024-02-02 NOTE — Progress Notes (Signed)
 Physical Therapy Treatment Patient Details Name: Kristin Mack MRN: [id number] DOB: 1940/07/06 Today's Date: 02/02/2024   History of Present Illness Pt s/p R TKA and with hx of CHF, CAD, MI, cardiomyopathy, L THR (20+ years ago with residual drop foot noted)    PT Comments  POD # 1 am session AxO x 3 sharpe Lady.  Son plans to stay with her as well as other family support.  Had prior THR surgery in March and did well. Assisted OOB to amb to the bathroom went well. General bed mobility comments: demonstarted and instructed how to use a belt to self assist LE off bed which Pt was self able to perform. General transfer comment: VC's to extend R LE prior to sit as well as VC's on proper hand placement to control desend.  Also assisted with a toilet transfer.  VC's o safety with turns and proper sequencing with back steps.  Pt was self able to perform peri care in standing at [facility].  No c/o dizziness and pain 5/10. General Gait Details: Pt tolerated amb to the bathroom then in hallway a total of 46 feet with VC's on proper sequencing as well as proper walker to self distance.  Instructed to take smaller steps as she c/o knee instability. Then returned to room to perform some TE's following HEP handout.  Instructed on proper tech, freq as well as use of ICE.   Pt will have a PM PT session to complete HEP and practice stairs.    If plan is discharge home, recommend the following: A little help with walking and/or transfers;A little help with bathing/dressing/bathroom;Assistance with cooking/housework;Assist for transportation;Help with stairs or ramp for entrance   Can travel by private vehicle        Equipment Recommendations  None recommended by PT    Recommendations for Other Services       Precautions / Restrictions Precautions Precautions: Fall Precaution/Restrictions Comments: no pillow under knee Restrictions Weight Bearing Restrictions Per Provider Order: No RLE  Weight Bearing Per Provider Order: Weight bearing as tolerated     Mobility  Bed Mobility Overal bed mobility: Needs Assistance Bed Mobility: Supine to Sit     Supine to sit: Supervision     General bed mobility comments: demonstarted and instructed how to use a belt to self assist LE off bed which Pt was self able to perform.    Transfers Overall transfer level: Needs assistance Equipment used: Rolling walker (2 wheels) Transfers: Sit to/from Stand Sit to Stand: Supervision, Contact guard assist           General transfer comment: VC's to extend R LE prior to sit as well as VC's on proper hand placement to control desend.  Also assisted with a toilet transfer.  VC's o safety with turns and proper sequencing with back steps.  Pt was self able to perform peri care in standing at [facility].  No c/o dizziness and pain 5/10.    Ambulation/Gait Ambulation/Gait assistance: Supervision, Contact guard assist Gait Distance (Feet): 46 Feet Assistive device: Rolling walker (2 wheels) Gait Pattern/deviations: Step-to pattern, Antalgic, Decreased stance time - right, Decreased step length - left, Narrow base of support Gait velocity: decreased     General Gait Details: Pt tolerated amb to the bathroom then in hallway a total of 46 feet with VC's on proper sequencing as well as proper walker to self distance.  Instructed to take smaller steps as she c/o knee instability.  Stairs             Wheelchair Mobility     Tilt Bed    Modified Rankin (Stroke Patients Only)       Balance                                            Communication Communication Communication: No apparent difficulties  Cognition Arousal: Alert Behavior During Therapy: WFL for tasks assessed/performed   PT - Cognitive impairments: No apparent impairments                       PT - Cognition Comments: AxO x 3 sharpe Lady.  Son plans to stay with her as well  as other family support.  Had prior THR surgery in March and did well. Following commands: Intact      Cueing Cueing Techniques: Verbal cues  Exercises  Total Knee Replacement TE's following HEP handout 10 reps B LE ankle pumps 05 reps towel squeezes 05 reps knee presses 05 reps heel slides  05 reps SLR's 05 reps ABD Educated on use of gait belt to assist with TE's Followed by ICE     General Comments        Pertinent Vitals/Pain Pain Assessment Pain Assessment: 0-10 Pain Score: 5  Pain Location: R knee Pain Descriptors / Indicators: Aching, Constant, Discomfort, Operative site guarding Pain Intervention(s): Premedicated before session, Repositioned, Monitored during session, Ice applied    Home Living                          Prior Function            PT Goals (current goals can now be found in the care plan section) Progress towards PT goals: Progressing toward goals    Frequency    7X/week      PT Plan      Co-evaluation              AM-PAC PT 6 Clicks Mobility   Outcome Measure  Help needed turning from your back to your side while in a flat bed without using bedrails?: A Little Help needed moving from lying on your back to sitting on the side of a flat bed without using bedrails?: A Little Help needed moving to and from a bed to a chair (including a wheelchair)?: A Little Help needed standing up from a chair using your arms (e.g., wheelchair or bedside chair)?: A Little Help needed to walk in hospital room?: A Little Help needed climbing 3-5 steps with a railing? : A Lot 6 Click Score: 17    End of Session Equipment Utilized During Treatment: Gait belt Activity Tolerance: Patient tolerated treatment well Patient left: in chair;with call bell/phone within reach;with chair alarm set Nurse Communication: Mobility status PT Visit Diagnosis: Unsteadiness on feet (R26.81);Difficulty in walking, not elsewhere classified (R26.2)      Time: 8955-8883 PT Time Calculation (min) (ACUTE ONLY): 32 min  Charges:    $Gait Training: 8-22 mins $Therapeutic Activity: 8-22 mins PT General Charges $$ ACUTE PT VISIT: 1 Visit                    Katheryn Leap  PTA Acute  Rehabilitation Services Office M-F          [phone]

## 2024-02-02 NOTE — Progress Notes (Signed)
 Physical Therapy Treatment Patient Details Name: Kristin Mack MRN: [id number] DOB: [birth year] Today's Date: 02/02/2024   History of Present Illness Pt s/p R TKA and with hx of CHF, CAD, MI, cardiomyopathy, L THR (20+ years ago with residual drop foot noted)    PT Comments  POD # 1 pm session Pt took a long nap stated, I feel much better.  Pt still in recliner and had not moved since am PT ended.  Pt did eat lunch.  Assisted out of recliner to amb to bathroom.  General transfer comment: VC's for safety with turns while assisted in the bathroom as well as VC's on proper sequencing stepping backward.  Pt able to perform self peri care standing with Contact Guard Assist.  General Gait Details: Pt tolerated amb to and from bathroom as well as in room a total of 32 feet with walker.  Slow but overall steady.  VC's on safety with back steps and turns.  Instructed Pt to take shorter steps due to knee instability post surgery.  Pt was able to retain and recall.   Called Son on Pt's phone and placed on speaker during end of PT session for an update on Pt's mobility as well as Education on safe handling.  Son lives with Pt and was very helpful when Pt had hip replaced.  Advised Son to be hands on assisting with all mobility esp when Pt is in bathroom turning and stepping backward.  Stairs:  (RAMP advised SON to be hands on while Pt goes up the RAMP into home.) Educated on ICE MAN machine proper use, freq and sent him pictures on proper placement.  Educated on [employer].   Pt has met her mobility goals to D/C to home with full Family support. Reported to RN, Son plans to be out front at 4pm to take Pt home.     If plan is discharge home, recommend the following: A little help with walking and/or transfers;A little help with bathing/dressing/bathroom;Assistance with cooking/housework;Assist for transportation;Help with stairs or ramp for entrance   Can travel by private vehicle        Equipment  Recommendations  None recommended by PT    Recommendations for Other Services       Precautions / Restrictions Precautions Precautions: Fall Precaution/Restrictions Comments: no pillow under knee Restrictions Weight Bearing Restrictions Per Provider Order: No RLE Weight Bearing Per Provider Order: Weight bearing as tolerated     Mobility  Bed Mobility   General bed mobility comments: OOB in recliner    Transfers Overall transfer level: Needs assistance Equipment used: Rolling walker (2 wheels) Transfers: Sit to/from Stand Sit to Stand: Supervision, Contact guard assist   Step pivot transfers: Supervision, Contact guard assist       General transfer comment: VC's for safety with turns while assisted in the bathroom as well as VC's on proper sequencing stepping backward.  Pt able to perform self peri care standing with Contact Guard Assist.  Advised Son to be hands on and assist Pt with all transfers until Pt is more confident.    Ambulation/Gait Ambulation/Gait assistance: Supervision, Contact guard assist Gait Distance (Feet): 32 Feet Assistive device: Rolling walker (2 wheels) Gait Pattern/deviations: Step-to pattern, Antalgic, Decreased stance time - right, Decreased step length - left, Narrow base of support Gait velocity: decreased     General Gait Details: Pt tolerated amb to and from bathroom as well as in room a total of 32 feet with walker.  Slow but  overall steady.  VC's on safety with back steps and turns.  Instructed Pt to take shorter steps due to knee instability post surgery.  Pt was able to retain and recall.  Advised Son to be hands on with all mobility until Pt is more confident.   Stairs Stairs:  (RAMP advised SON to be hands on while Pt goes up the RAMP into home.)           Wheelchair Mobility     Tilt Bed    Modified Rankin (Stroke Patients Only)       Balance                                             Communication    Cognition Arousal: Alert Behavior During Therapy: WFL for tasks assessed/performed   PT - Cognitive impairments: No apparent impairments                       PT - Cognition Comments: AxO x 3 sharpe Lady.  Son plans to stay with her as well as other family support.  Had prior THR surgery in March and did well. Following commands: Intact      Cueing Cueing Techniques: Verbal cues  Exercises  Total Knee Replacement TE's following HEP handout  05 reps all seated TE's  Educated on use of gait belt to assist with TE's Followed by ICE     General Comments        Pertinent Vitals/Pain Pain Assessment Pain Assessment: 0-10 Pain Score: 5  Pain Location: R knee Pain Descriptors / Indicators: Aching, Constant, Discomfort, Operative site guarding Pain Intervention(s): Monitored during session, Premedicated before session, Repositioned, Ice applied    Home Living                          Prior Function            PT Goals (current goals can now be found in the care plan section) Progress towards PT goals: Progressing toward goals    Frequency    7X/week      PT Plan      Co-evaluation              AM-PAC PT 6 Clicks Mobility   Outcome Measure  Help needed turning from your back to your side while in a flat bed without using bedrails?: A Little Help needed moving from lying on your back to sitting on the side of a flat bed without using bedrails?: A Little Help needed moving to and from a bed to a chair (including a wheelchair)?: A Little Help needed standing up from a chair using your arms (e.g., wheelchair or bedside chair)?: A Little Help needed to walk in hospital room?: A Little Help needed climbing 3-5 steps with a railing? : A Little 6 Click Score: 18    End of Session Equipment Utilized During Treatment: Gait belt Activity Tolerance: Patient tolerated treatment well Patient left: in chair;with call  bell/phone within reach;with chair alarm set Nurse Communication: Mobility status PT Visit Diagnosis: Unsteadiness on feet (R26.81);Difficulty in walking, not elsewhere classified (R26.2)     Time: 8592-8556 PT Time Calculation (min) (ACUTE ONLY): 36 min  Charges:    $Gait Training: 8-22 mins $Therapeutic Activity: 8-22 mins PT General Charges $$ ACUTE PT VISIT: 1  Visit                     Katheryn Leap  PTA Acute  Rehabilitation Services Office M-F          [phone]

## 2024-02-02 NOTE — Care Management Obs Status (Signed)
 MEDICARE OBSERVATION STATUS NOTIFICATION   Patient Details  Name: Kristin Mack MRN: [id number] Date of Birth: 05/31/1941   Medicare Observation Status Notification Given:  Yes    NORMAN ASPEN, LCSW 02/02/2024, 10:26 AM

## 2024-02-04 DIAGNOSIS — M25661 Stiffness of right knee, not elsewhere classified: Secondary | ICD-10-CM | POA: Diagnosis not present

## 2024-02-04 DIAGNOSIS — M25561 Pain in right knee: Secondary | ICD-10-CM | POA: Diagnosis not present

## 2024-02-07 DIAGNOSIS — M25661 Stiffness of right knee, not elsewhere classified: Secondary | ICD-10-CM | POA: Diagnosis not present

## 2024-02-08 NOTE — Discharge Summary (Signed)
 Patient ID: Kristin Mack MRN: [id number] DOB/AGE: 1941-01-14 83 y.o.  Admit date: 02/01/2024 Discharge date: 02/02/2024  Admission Diagnoses:  Right knee osteoarthritis  Discharge Diagnoses:  Principal Problem:   S/P total knee arthroplasty, right   Past Medical History:  Diagnosis Date   Anxiety    Carpal tunnel syndrome    Left Hand  nueuropath L foot   Chest pain    CHF (congestive heart failure) (HCC)    Dilated cardiomyopathy. Her original ejection fraction was 10-20%. Her ejection fraction has improved to 57% by 2009   Coronary artery disease    Depression    Dyslipidemia    Foot drop, left    from a left hip repacement   GERD (gastroesophageal reflux disease)    Hemorrhoids    History of anxiety    Hypertension    Lower back pain    Myocardial infarction (HCC)    2002   Obesity    Osteoarthritis    Severe with collapse of the left hip   Pneumonia    Single functional kidney    single right kidney,  left kidney is atrophic    Surgeries: Procedure(s): ARTHROPLASTY, KNEE, TOTAL on 02/01/2024   Consultants:   Discharged Condition: Improved  Hospital Course: Kristin Mack is an 83 y.o. female who was admitted 02/01/2024 for operative treatment ofS/P total knee arthroplasty, right. Patient has severe unremitting pain that affects sleep, daily activities, and work/hobbies. After pre-op clearance the patient was taken to the operating room on 02/01/2024 and underwent  Procedure(s): ARTHROPLASTY, KNEE, TOTAL.    Patient was given perioperative antibiotics:  Anti-infectives (From admission, onward)    Start     Dose/Rate Route Frequency Ordered Stop   02/01/24 1330  ceFAZolin  (ANCEF ) IVPB 2g/100 mL premix        2 g 200 mL/hr over 30 Minutes Intravenous Every 6 hours 02/01/24 1014 02/01/24 2100   02/01/24 1100  trimethoprim  (TRIMPEX ) tablet 100 mg  Status:  Discontinued        100 mg Oral Daily 02/01/24 1014 02/02/24 2104   02/01/24 0600  ceFAZolin   (ANCEF ) IVPB 2g/100 mL premix        2 g 200 mL/hr over 30 Minutes Intravenous On call to O.R. 02/01/24 0531 02/01/24 9261        Patient was given sequential compression devices, early ambulation, and chemoprophylaxis to prevent DVT. Patient worked with PT and was meeting their goals regarding safe ambulation and transfers.  Patient benefited maximally from hospital stay and there were no complications.    Recent vital signs: No data found.   Recent laboratory studies: No results for input(s): WBC, HGB, HCT, PLT, NA, K, CL, CO2, BUN, CREATININE, GLUCOSE, INR, CALCIUM  in the last 72 hours.  Invalid input(s): PT, 2   Discharge Medications:   Allergies as of 02/02/2024       Reactions   Celecoxib Other (See Comments)   May have contributed to congestive heart failure Other Reaction(s): Unknown   Rofecoxib    May have contributed to congestive heart failure Other Reaction(s): Unknown   Nickel Itching, Rash        Medication List     STOP taking these medications    acetaminophen  500 MG tablet Commonly known as: TYLENOL    amoxicillin  500 MG capsule Commonly known as: AMOXIL    aspirin  EC 81 MG tablet Replaced by: aspirin  81 MG chewable tablet   nitroGLYCERIN  0.4 MG SL tablet Commonly known as: NITROSTAT   TAKE these medications    aspirin  81 MG chewable tablet Chew 1 tablet (81 mg total) by mouth 2 (two) times daily for 28 days. Replaces: aspirin  EC 81 MG tablet   atorvastatin  40 MG tablet Commonly known as: LIPITOR Take 40 mg by mouth daily.   Biotin 5000 MCG Caps Take 5,000 mcg by mouth daily.   CALCIUM  600 + D PO Take 1 tablet by mouth daily.   carvedilol  12.5 MG tablet Commonly known as: COREG  TAKE 1 TABLET BY MOUTH TWICE DAILY WITH A MEAL   Centrum Silver  Ultra Womens Tabs Take 1 tablet by mouth daily.   Clinpro 5000 1.1 % Pste Generic drug: Sodium Fluoride Place 1 Application onto teeth at bedtime.    clobetasol cream 0.05 % Commonly known as: TEMOVATE Apply 1 application  topically 2 (two) times daily.   denosumab  60 MG/ML Sosy injection Commonly known as: PROLIA  Inject 60 mg into the skin every 6 (six) months. Notes to patient: As presribed   Entresto  24-26 MG Generic drug: sacubitril -valsartan  Take 1 tablet by mouth 2 (two) times daily.   estradiol  0.1 MG/GM vaginal cream Commonly known as: ESTRACE  Place 1 Applicatorful vaginally 3 (three) times a week. estradiol  0.01% (0.1 mg/gram) vaginal cream apply locally 3 times per week Notes to patient: As presribed   ezetimibe  10 MG tablet Commonly known as: ZETIA  Take 1 tablet (10 mg total) by mouth daily.   famotidine  20 MG tablet Commonly known as: PEPCID  Take 20 mg by mouth 2 (two) times daily.   Fish Oil 1200 MG Caps Take 1,200 mg by mouth daily.   latanoprost  0.005 % ophthalmic solution Commonly known as: XALATAN  Place 1 drop into both eyes at bedtime.   LORazepam  0.5 MG tablet Commonly known as: ATIVAN  Take 0.5 mg by mouth at bedtime as needed for anxiety.   LUBRICATING EYE DROPS OP Place 1 drop into both eyes daily as needed (dry eyes).   melatonin 5 MG Tabs Take 5 mg by mouth at bedtime.   methocarbamol  500 MG tablet Commonly known as: ROBAXIN  Take 1 tablet (500 mg total) by mouth every 6 (six) hours as needed for muscle spasms.   mupirocin  ointment 2 % Commonly known as: BACTROBAN  APPLY TOPICALLY TO THE AFFECTED AREA TWICE DAILY   oxyCODONE  5 MG immediate release tablet Commonly known as: Oxy IR/ROXICODONE  Take 1 tablet (5 mg total) by mouth every 4 (four) hours as needed for severe pain (pain score 7-10).   polyethylene glycol 17 g packet Commonly known as: MIRALAX  / GLYCOLAX  Take 17 g by mouth 2 (two) times daily. What changed: when to take this   polyethylene glycol 17 g packet Commonly known as: MIRALAX  / GLYCOLAX  Take 17 g by mouth 2 (two) times daily. What changed: You were already taking  a medication with the same name, and this prescription was added. Make sure you understand how and when to take each.   PROBIOTIC PO Take 1 capsule by mouth daily.   risperiDONE  1 MG tablet Commonly known as: RISPERDAL  Take 1 mg by mouth at bedtime.   senna 8.6 MG Tabs tablet Commonly known as: SENOKOT Take 2 tablets (17.2 mg total) by mouth at bedtime for 14 days.   trimethoprim  100 MG tablet Commonly known as: TRIMPEX  Take 100 mg by mouth daily.   venlafaxine  XR 75 MG 24 hr capsule Commonly known as: EFFEXOR -XR Take 225 mg by mouth daily.   vitamin C 1000 MG tablet Take 1,000 mg by mouth daily.  Vitamin D3 50 MCG (2000 UT) capsule Take 2,000 Units by mouth daily.               Discharge Care Instructions  (From admission, onward)           Start     Ordered   02/02/24 0000  Change dressing       Comments: Maintain surgical dressing until follow up in the clinic. If the edges start to pull up, may reinforce with tape. If the dressing is no longer working, may remove and cover with gauze and tape, but must keep the area dry and clean.  Call with any questions or concerns.   02/02/24 0756            Diagnostic Studies: No results found.  Disposition: Discharge disposition: 01-Home or Self Care       Discharge Instructions     Call MD / Call 911   Complete by: As directed    If you experience chest pain or shortness of breath, CALL 911 and be transported to the hospital emergency room.  If you develope a fever above 101 F, pus (white drainage) or increased drainage or redness at the wound, or calf pain, call your surgeon's office.   Change dressing   Complete by: As directed    Maintain surgical dressing until follow up in the clinic. If the edges start to pull up, may reinforce with tape. If the dressing is no longer working, may remove and cover with gauze and tape, but must keep the area dry and clean.  Call with any questions or concerns.    Constipation Prevention   Complete by: As directed    Drink plenty of fluids.  Prune juice may be helpful.  You may use a stool softener, such as Colace (over the counter) 100 mg twice a day.  Use MiraLax  (over the counter) for constipation as needed.   Diet - low sodium heart healthy   Complete by: As directed    Increase activity slowly as tolerated   Complete by: As directed    Weight bearing as tolerated with assist device (walker, cane, etc) as directed, use it as long as suggested by your surgeon or therapist, typically at least 4-6 weeks.   Post-operative opioid taper instructions:   Complete by: As directed    POST-OPERATIVE OPIOID TAPER INSTRUCTIONS: It is important to wean off of your opioid medication as soon as possible. If you do not need pain medication after your surgery it is ok to stop day one. Opioids include: Codeine, Hydrocodone (Norco, Vicodin), Oxycodone (Percocet, oxycontin ) and hydromorphone  amongst others.  Long term and even short term use of opiods can cause: Increased pain response Dependence Constipation Depression Respiratory depression And more.  Withdrawal symptoms can include Flu like symptoms Nausea, vomiting And more Techniques to manage these symptoms Hydrate well Eat regular healthy meals Stay active Use relaxation techniques(deep breathing, meditating, yoga) Do Not substitute Alcohol  to help with tapering If you have been on opioids for less than two weeks and do not have pain than it is ok to stop all together.  Plan to wean off of opioids This plan should start within one week post op of your joint replacement. Maintain the same interval or time between taking each dose and first decrease the dose.  Cut the total daily intake of opioids by one tablet each day Next start to increase the time between doses. The last dose that should be eliminated is the evening  dose.      TED hose   Complete by: As directed    Use stockings (TED hose) for  2 weeks on both leg(s).  You may remove them at night for sleeping.        Follow-up Information     [facility] Go on 02/04/2024.   Why: You are scheduled for physical therapy Friday 02/04/24 at 9:30am; [address] Contact information: [address] [place] [address] [phone]         Ernie Cough, MD. Go on 02/16/2024.   Specialty: Orthopedic Surgery Why: You are scheduled for a post op appointment on Wednesday 02/16/24 at 10:45am Contact information: [address] [place] [address] [place] [address] [phone]                  Signed: Rosina JONELLE Calin 02/08/2024, 8:57 AM

## 2024-02-09 DIAGNOSIS — F429 Obsessive-compulsive disorder, unspecified: Secondary | ICD-10-CM | POA: Diagnosis not present

## 2024-02-10 DIAGNOSIS — M25661 Stiffness of right knee, not elsewhere classified: Secondary | ICD-10-CM | POA: Diagnosis not present

## 2024-02-14 DIAGNOSIS — M25661 Stiffness of right knee, not elsewhere classified: Secondary | ICD-10-CM | POA: Diagnosis not present

## 2024-02-16 DIAGNOSIS — M25661 Stiffness of right knee, not elsewhere classified: Secondary | ICD-10-CM | POA: Diagnosis not present

## 2024-02-22 DIAGNOSIS — M25661 Stiffness of right knee, not elsewhere classified: Secondary | ICD-10-CM | POA: Diagnosis not present

## 2024-02-24 DIAGNOSIS — M25661 Stiffness of right knee, not elsewhere classified: Secondary | ICD-10-CM | POA: Diagnosis not present

## 2024-02-25 DIAGNOSIS — F429 Obsessive-compulsive disorder, unspecified: Secondary | ICD-10-CM | POA: Diagnosis not present

## 2024-02-29 DIAGNOSIS — M25661 Stiffness of right knee, not elsewhere classified: Secondary | ICD-10-CM | POA: Diagnosis not present

## 2024-03-02 ENCOUNTER — Ambulatory Visit: Admitting: Podiatry

## 2024-03-02 DIAGNOSIS — B351 Tinea unguium: Secondary | ICD-10-CM

## 2024-03-02 DIAGNOSIS — M79676 Pain in unspecified toe(s): Secondary | ICD-10-CM

## 2024-03-02 DIAGNOSIS — L97521 Non-pressure chronic ulcer of other part of left foot limited to breakdown of skin: Secondary | ICD-10-CM

## 2024-03-02 DIAGNOSIS — E11621 Type 2 diabetes mellitus with foot ulcer: Secondary | ICD-10-CM | POA: Diagnosis not present

## 2024-03-02 NOTE — Progress Notes (Signed)
 She presents today having recently had a total knee replacement on the right side.  She states that she is unable to take care of the ulcer on the left foot.  She like to have her nails cut.  Objective: Vital signs stable oriented x 3.  Pulses are palpable.  There is no erythema edema salines drainage or odor hallux left does demonstrate reactive hyperkeratotic lesion was debrided demonstrates a very small open ulcer measuring less than 3 mm in diameter.  No purulence no malodor.  Toenails are long thick yellow dystrophic onychomycotic.  Rigid hammertoe deformity hallux valgus deformities are noted bilateral.  Assessment: Hammertoe deformity hallux valgus deformity pain in limb secondary to onychomycosis diabetes mellitus with diabetic peripheral neuropathy and diabetic ulceration.  Plan: Debrided ulcer today.  Debrided toenails 1 through 5 bilateral.  Redressed ulcer today and I will follow-up with her in 3 months.  Call sooner if needed.  She will continue daily dressing of the ulceration.

## 2024-03-03 DIAGNOSIS — M25661 Stiffness of right knee, not elsewhere classified: Secondary | ICD-10-CM | POA: Diagnosis not present

## 2024-03-07 DIAGNOSIS — M25661 Stiffness of right knee, not elsewhere classified: Secondary | ICD-10-CM | POA: Diagnosis not present

## 2024-03-07 DIAGNOSIS — M25561 Pain in right knee: Secondary | ICD-10-CM | POA: Diagnosis not present

## 2024-03-08 DIAGNOSIS — F429 Obsessive-compulsive disorder, unspecified: Secondary | ICD-10-CM | POA: Diagnosis not present

## 2024-03-09 DIAGNOSIS — M25661 Stiffness of right knee, not elsewhere classified: Secondary | ICD-10-CM | POA: Diagnosis not present

## 2024-03-09 DIAGNOSIS — M25561 Pain in right knee: Secondary | ICD-10-CM | POA: Diagnosis not present

## 2024-03-15 DIAGNOSIS — Z96651 Presence of right artificial knee joint: Secondary | ICD-10-CM | POA: Diagnosis not present

## 2024-03-15 DIAGNOSIS — Z471 Aftercare following joint replacement surgery: Secondary | ICD-10-CM | POA: Diagnosis not present

## 2024-03-15 DIAGNOSIS — M25561 Pain in right knee: Secondary | ICD-10-CM | POA: Diagnosis not present

## 2024-03-15 DIAGNOSIS — M25661 Stiffness of right knee, not elsewhere classified: Secondary | ICD-10-CM | POA: Diagnosis not present

## 2024-03-17 DIAGNOSIS — M25561 Pain in right knee: Secondary | ICD-10-CM | POA: Diagnosis not present

## 2024-03-17 DIAGNOSIS — M25661 Stiffness of right knee, not elsewhere classified: Secondary | ICD-10-CM | POA: Diagnosis not present

## 2024-03-20 ENCOUNTER — Other Ambulatory Visit: Payer: Self-pay | Admitting: Nurse Practitioner

## 2024-03-20 DIAGNOSIS — E785 Hyperlipidemia, unspecified: Secondary | ICD-10-CM

## 2024-03-20 DIAGNOSIS — M25561 Pain in right knee: Secondary | ICD-10-CM | POA: Diagnosis not present

## 2024-03-20 DIAGNOSIS — I2510 Atherosclerotic heart disease of native coronary artery without angina pectoris: Secondary | ICD-10-CM

## 2024-03-20 DIAGNOSIS — M25661 Stiffness of right knee, not elsewhere classified: Secondary | ICD-10-CM | POA: Diagnosis not present

## 2024-03-20 MED ORDER — EZETIMIBE 10 MG PO TABS: 10.0000 mg | ORAL_TABLET | Freq: Every day | ORAL | 3 refills | Status: AC

## 2024-03-22 DIAGNOSIS — F429 Obsessive-compulsive disorder, unspecified: Secondary | ICD-10-CM | POA: Diagnosis not present

## 2024-03-24 DIAGNOSIS — M25561 Pain in right knee: Secondary | ICD-10-CM | POA: Diagnosis not present

## 2024-03-24 DIAGNOSIS — M25661 Stiffness of right knee, not elsewhere classified: Secondary | ICD-10-CM | POA: Diagnosis not present

## 2024-03-27 DIAGNOSIS — M25661 Stiffness of right knee, not elsewhere classified: Secondary | ICD-10-CM | POA: Diagnosis not present

## 2024-03-27 DIAGNOSIS — M25561 Pain in right knee: Secondary | ICD-10-CM | POA: Diagnosis not present

## 2024-03-31 DIAGNOSIS — M25561 Pain in right knee: Secondary | ICD-10-CM | POA: Diagnosis not present

## 2024-03-31 DIAGNOSIS — M25661 Stiffness of right knee, not elsewhere classified: Secondary | ICD-10-CM | POA: Diagnosis not present

## 2024-04-07 ENCOUNTER — Other Ambulatory Visit: Payer: Self-pay | Admitting: Cardiology

## 2024-04-10 MED ORDER — CARVEDILOL 12.5 MG PO TABS: 12.5000 mg | ORAL_TABLET | Freq: Two times a day (BID) | ORAL | 2 refills | Status: AC

## 2024-04-20 DIAGNOSIS — H5213 Myopia, bilateral: Secondary | ICD-10-CM | POA: Diagnosis not present

## 2024-04-20 DIAGNOSIS — Z961 Presence of intraocular lens: Secondary | ICD-10-CM | POA: Diagnosis not present

## 2024-04-20 DIAGNOSIS — H33303 Unspecified retinal break, bilateral: Secondary | ICD-10-CM | POA: Diagnosis not present

## 2024-04-20 DIAGNOSIS — H401111 Primary open-angle glaucoma, right eye, mild stage: Secondary | ICD-10-CM | POA: Diagnosis not present

## 2024-04-20 DIAGNOSIS — H401123 Primary open-angle glaucoma, left eye, severe stage: Secondary | ICD-10-CM | POA: Diagnosis not present

## 2024-05-01 DIAGNOSIS — L821 Other seborrheic keratosis: Secondary | ICD-10-CM | POA: Diagnosis not present

## 2024-05-01 DIAGNOSIS — Z85828 Personal history of other malignant neoplasm of skin: Secondary | ICD-10-CM | POA: Diagnosis not present

## 2024-05-01 DIAGNOSIS — L570 Actinic keratosis: Secondary | ICD-10-CM | POA: Diagnosis not present

## 2024-05-03 DIAGNOSIS — F429 Obsessive-compulsive disorder, unspecified: Secondary | ICD-10-CM | POA: Diagnosis not present

## 2024-05-09 DIAGNOSIS — M810 Age-related osteoporosis without current pathological fracture: Secondary | ICD-10-CM | POA: Diagnosis not present

## 2024-05-09 DIAGNOSIS — Z5181 Encounter for therapeutic drug level monitoring: Secondary | ICD-10-CM | POA: Diagnosis not present

## 2024-05-24 ENCOUNTER — Other Ambulatory Visit: Payer: Self-pay | Admitting: Family Medicine

## 2024-05-24 DIAGNOSIS — R1031 Right lower quadrant pain: Secondary | ICD-10-CM

## 2024-05-25 ENCOUNTER — Ambulatory Visit
Admission: RE | Admit: 2024-05-25 | Discharge: 2024-05-25 | Disposition: A | Source: Ambulatory Visit | Attending: Family Medicine | Admitting: Family Medicine

## 2024-05-25 DIAGNOSIS — K449 Diaphragmatic hernia without obstruction or gangrene: Secondary | ICD-10-CM | POA: Diagnosis not present

## 2024-05-25 DIAGNOSIS — R1032 Left lower quadrant pain: Secondary | ICD-10-CM | POA: Diagnosis not present

## 2024-05-25 DIAGNOSIS — R1031 Right lower quadrant pain: Secondary | ICD-10-CM

## 2024-05-25 DIAGNOSIS — D259 Leiomyoma of uterus, unspecified: Secondary | ICD-10-CM | POA: Diagnosis not present

## 2024-05-25 MED ORDER — IOPAMIDOL (ISOVUE-300) INJECTION 61%
100.0000 mL | Freq: Once | INTRAVENOUS | Status: AC | PRN
  Administered 2024-05-25: 100 mL via INTRAVENOUS

## 2024-06-01 ENCOUNTER — Ambulatory Visit: Admitting: Podiatry

## 2024-06-06 ENCOUNTER — Ambulatory Visit: Admitting: Podiatry

## 2024-06-06 ENCOUNTER — Encounter: Payer: Self-pay | Admitting: Podiatry

## 2024-06-06 DIAGNOSIS — E11621 Type 2 diabetes mellitus with foot ulcer: Secondary | ICD-10-CM

## 2024-06-06 DIAGNOSIS — E66811 Obesity, class 1: Secondary | ICD-10-CM | POA: Insufficient documentation

## 2024-06-06 DIAGNOSIS — H9190 Unspecified hearing loss, unspecified ear: Secondary | ICD-10-CM | POA: Insufficient documentation

## 2024-06-06 DIAGNOSIS — G9332 Myalgic encephalomyelitis/chronic fatigue syndrome: Secondary | ICD-10-CM | POA: Insufficient documentation

## 2024-06-06 DIAGNOSIS — B351 Tinea unguium: Secondary | ICD-10-CM

## 2024-06-06 DIAGNOSIS — L97521 Non-pressure chronic ulcer of other part of left foot limited to breakdown of skin: Secondary | ICD-10-CM

## 2024-06-06 DIAGNOSIS — I5041 Acute combined systolic (congestive) and diastolic (congestive) heart failure: Secondary | ICD-10-CM | POA: Insufficient documentation

## 2024-06-06 DIAGNOSIS — U071 COVID-19: Secondary | ICD-10-CM | POA: Insufficient documentation

## 2024-06-06 DIAGNOSIS — N2889 Other specified disorders of kidney and ureter: Secondary | ICD-10-CM | POA: Insufficient documentation

## 2024-06-06 DIAGNOSIS — Z860100 Personal history of colon polyps, unspecified: Secondary | ICD-10-CM | POA: Insufficient documentation

## 2024-06-06 DIAGNOSIS — I739 Peripheral vascular disease, unspecified: Secondary | ICD-10-CM | POA: Insufficient documentation

## 2024-06-06 DIAGNOSIS — M79676 Pain in unspecified toe(s): Secondary | ICD-10-CM | POA: Diagnosis not present

## 2024-06-06 DIAGNOSIS — K5900 Constipation, unspecified: Secondary | ICD-10-CM | POA: Insufficient documentation

## 2024-06-06 DIAGNOSIS — D509 Iron deficiency anemia, unspecified: Secondary | ICD-10-CM | POA: Insufficient documentation

## 2024-06-06 DIAGNOSIS — R1032 Left lower quadrant pain: Secondary | ICD-10-CM | POA: Insufficient documentation

## 2024-06-06 DIAGNOSIS — N289 Disorder of kidney and ureter, unspecified: Secondary | ICD-10-CM | POA: Insufficient documentation

## 2024-06-06 NOTE — Progress Notes (Signed)
 She presents today for routine nail nail debridement.  Objective: Vital signs are stable alert and oriented x 3.  Pulses are palpable.  She has digital deformities that resulted in thick mycotic nails as well as benign skin lesions to the forefoot bilaterally.  Ulcerative lesion that was chronic has gone on to heal on the plantar aspect of the distal phalanx of the hallux left.  No open lesions or wounds at this time.  Assessment: Pain limb secondary to onychomycosis digital deformity and benign skin lesions.  Plan: Debridement of benign skin lesions debridement of toenails 1 through 5 bilateral.

## 2024-09-05 ENCOUNTER — Ambulatory Visit: Admitting: Podiatry
